# Patient Record
Sex: Male | Born: 1935 | Race: White | Hispanic: No
Health system: Midwestern US, Community
[De-identification: ages and names within clinical notes are randomized; demographics above are authoritative.]

## PROBLEM LIST (undated history)

## (undated) DIAGNOSIS — J810 Acute pulmonary edema: Principal | ICD-10-CM

## (undated) DIAGNOSIS — R1310 Dysphagia, unspecified: Secondary | ICD-10-CM

## (undated) DIAGNOSIS — I10 Essential (primary) hypertension: Secondary | ICD-10-CM

## (undated) DIAGNOSIS — I63512 Cerebral infarction due to unspecified occlusion or stenosis of left middle cerebral artery: Secondary | ICD-10-CM

## (undated) DIAGNOSIS — R339 Retention of urine, unspecified: Secondary | ICD-10-CM

## (undated) DIAGNOSIS — U071 COVID-19: Secondary | ICD-10-CM

## (undated) DIAGNOSIS — R059 Cough, unspecified: Secondary | ICD-10-CM

## (undated) DIAGNOSIS — N401 Enlarged prostate with lower urinary tract symptoms: Secondary | ICD-10-CM

## (undated) DIAGNOSIS — F329 Major depressive disorder, single episode, unspecified: Secondary | ICD-10-CM

## (undated) DIAGNOSIS — N400 Enlarged prostate without lower urinary tract symptoms: Secondary | ICD-10-CM

## (undated) DIAGNOSIS — N183 Chronic kidney disease, stage 3 unspecified: Secondary | ICD-10-CM

## (undated) DIAGNOSIS — I350 Nonrheumatic aortic (valve) stenosis: Secondary | ICD-10-CM

## (undated) DIAGNOSIS — K219 Gastro-esophageal reflux disease without esophagitis: Secondary | ICD-10-CM

## (undated) DIAGNOSIS — I5032 Chronic diastolic (congestive) heart failure: Secondary | ICD-10-CM

## (undated) DIAGNOSIS — F32A Depression, unspecified: Secondary | ICD-10-CM

## (undated) DIAGNOSIS — M1990 Unspecified osteoarthritis, unspecified site: Secondary | ICD-10-CM

## (undated) DIAGNOSIS — M4800 Spinal stenosis, site unspecified: Secondary | ICD-10-CM

## (undated) DIAGNOSIS — K4090 Unilateral inguinal hernia, without obstruction or gangrene, not specified as recurrent: Secondary | ICD-10-CM

## (undated) DIAGNOSIS — H3530 Unspecified macular degeneration: Secondary | ICD-10-CM

## (undated) DIAGNOSIS — K5790 Diverticulosis of intestine, part unspecified, without perforation or abscess without bleeding: Secondary | ICD-10-CM

## (undated) DIAGNOSIS — R0602 Shortness of breath: Secondary | ICD-10-CM

## (undated) DIAGNOSIS — I4892 Unspecified atrial flutter: Secondary | ICD-10-CM

## (undated) DIAGNOSIS — T7840XA Allergy, unspecified, initial encounter: Secondary | ICD-10-CM

## (undated) DIAGNOSIS — Z872 Personal history of diseases of the skin and subcutaneous tissue: Secondary | ICD-10-CM

## (undated) DIAGNOSIS — D649 Anemia, unspecified: Secondary | ICD-10-CM

## (undated) DIAGNOSIS — R918 Other nonspecific abnormal finding of lung field: Secondary | ICD-10-CM

## (undated) DIAGNOSIS — I509 Heart failure, unspecified: Secondary | ICD-10-CM

## (undated) DIAGNOSIS — H9190 Unspecified hearing loss, unspecified ear: Secondary | ICD-10-CM

## (undated) DIAGNOSIS — R911 Solitary pulmonary nodule: Secondary | ICD-10-CM

## (undated) HISTORY — DX: Depression, unspecified: F32.A

## (undated) HISTORY — DX: Unspecified macular degeneration: H35.30

## (undated) HISTORY — PX: CATARACT EXTRACTION, BILATERAL: SHX1313

## (undated) HISTORY — DX: Allergy, unspecified, initial encounter: T78.40XA

## (undated) HISTORY — DX: Chronic diastolic (congestive) heart failure: I50.32

## (undated) HISTORY — DX: Diverticulosis of intestine, part unspecified, without perforation or abscess without bleeding: K57.90

## (undated) HISTORY — DX: Major depressive disorder, single episode, unspecified: F32.9

## (undated) HISTORY — DX: Spinal stenosis, site unspecified: M48.00

## (undated) HISTORY — DX: Personal history of diseases of the skin and subcutaneous tissue: Z87.2

## (undated) HISTORY — DX: Nonrheumatic aortic (valve) stenosis: I35.0

## (undated) HISTORY — DX: Benign prostatic hyperplasia without lower urinary tract symptoms: N40.0

## (undated) HISTORY — DX: Solitary pulmonary nodule: R91.1

## (undated) HISTORY — DX: Essential (primary) hypertension: I10

## (undated) HISTORY — PX: SKIN BIOPSY: SHX1

## (undated) HISTORY — DX: Anemia, unspecified: D64.9

## (undated) HISTORY — DX: Unilateral inguinal hernia, without obstruction or gangrene, not specified as recurrent: K40.90

## (undated) HISTORY — DX: Unspecified hearing loss, unspecified ear: H91.90

## (undated) HISTORY — DX: Other nonspecific abnormal finding of lung field: R91.8

---
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
## 1995-10-29 HISTORY — PX: NECK SURGERY: SHX720

## 1999-10-10 ENCOUNTER — Encounter: Payer: Self-pay | Admitting: Gastroenterology

## 1999-10-10 ENCOUNTER — Ambulatory Visit (HOSPITAL_COMMUNITY): Admission: RE | Admit: 1999-10-10 | Discharge: 1999-10-10 | Payer: Self-pay | Admitting: Gastroenterology

## 2000-10-14 ENCOUNTER — Encounter: Admission: RE | Admit: 2000-10-14 | Discharge: 2000-10-14 | Payer: Self-pay | Admitting: Internal Medicine

## 2000-10-14 ENCOUNTER — Encounter: Payer: Self-pay | Admitting: Internal Medicine

## 2002-06-02 ENCOUNTER — Encounter: Payer: Self-pay | Admitting: Internal Medicine

## 2002-06-02 ENCOUNTER — Encounter: Admission: RE | Admit: 2002-06-02 | Discharge: 2002-06-02 | Payer: Self-pay | Admitting: Internal Medicine

## 2004-10-28 DIAGNOSIS — K5790 Diverticulosis of intestine, part unspecified, without perforation or abscess without bleeding: Secondary | ICD-10-CM

## 2004-10-28 HISTORY — DX: Diverticulosis of intestine, part unspecified, without perforation or abscess without bleeding: K57.90

## 2008-05-16 ENCOUNTER — Encounter: Admission: RE | Admit: 2008-05-16 | Discharge: 2008-06-09 | Payer: Self-pay | Admitting: Internal Medicine

## 2008-08-20 ENCOUNTER — Encounter: Admission: RE | Admit: 2008-08-20 | Discharge: 2008-08-20 | Payer: Self-pay | Admitting: Internal Medicine

## 2008-10-28 DIAGNOSIS — R918 Other nonspecific abnormal finding of lung field: Secondary | ICD-10-CM

## 2008-10-28 HISTORY — DX: Other nonspecific abnormal finding of lung field: R91.8

## 2009-01-19 ENCOUNTER — Inpatient Hospital Stay (HOSPITAL_COMMUNITY): Admission: EM | Admit: 2009-01-19 | Discharge: 2009-01-19 | Payer: Self-pay | Admitting: Emergency Medicine

## 2009-01-24 ENCOUNTER — Ambulatory Visit (HOSPITAL_COMMUNITY): Admission: RE | Admit: 2009-01-24 | Discharge: 2009-01-24 | Payer: Self-pay | Admitting: Internal Medicine

## 2009-06-01 ENCOUNTER — Encounter: Admission: RE | Admit: 2009-06-01 | Discharge: 2009-06-01 | Payer: Self-pay | Admitting: Internal Medicine

## 2009-07-14 ENCOUNTER — Ambulatory Visit (HOSPITAL_COMMUNITY): Admission: RE | Admit: 2009-07-14 | Discharge: 2009-07-14 | Payer: Self-pay | Admitting: Internal Medicine

## 2011-02-07 LAB — CBC
HCT: 38.4 % — ABNORMAL LOW (ref 39.0–52.0)
Hemoglobin: 13.5 g/dL (ref 13.0–17.0)
MCHC: 33.3 g/dL (ref 30.0–36.0)
MCHC: 35.6 g/dL (ref 30.0–36.0)
MCV: 97.4 fL (ref 78.0–100.0)
Platelets: 171 10*3/uL (ref 150–400)
RDW: 13.6 % (ref 11.5–15.5)
RDW: 13.6 % (ref 11.5–15.5)

## 2011-02-07 LAB — COMPREHENSIVE METABOLIC PANEL
ALT: 12 U/L (ref 0–53)
AST: 17 U/L (ref 0–37)
Albumin: 3.9 g/dL (ref 3.5–5.2)
CO2: 27 mEq/L (ref 19–32)
Calcium: 8.6 mg/dL (ref 8.4–10.5)
GFR calc Af Amer: >60 mL/min (ref >60)
GFR calc non Af Amer: >60 mL/min (ref >60)
Sodium: 136 mEq/L (ref 135–145)
Total Protein: 6.8 g/dL (ref 6.0–8.3)

## 2011-02-07 LAB — DIFFERENTIAL
Basophils Absolute: 0 10*3/uL (ref 0.0–0.1)
Basophils Relative: 1 % (ref 0–1)
Monocytes Absolute: 1 10*3/uL (ref 0.1–1.0)
Neutro Abs: 6.1 10*3/uL (ref 1.7–7.7)
Neutrophils Relative %: 66 % (ref 43–77)

## 2011-02-07 LAB — BASIC METABOLIC PANEL
BUN: 11 mg/dL (ref 6–23)
CO2: 27 mEq/L (ref 19–32)
Calcium: 8.6 mg/dL (ref 8.4–10.5)
Creatinine, Ser: 1 mg/dL (ref 0.4–1.5)
Glucose, Bld: 94 mg/dL (ref 70–99)

## 2011-02-07 LAB — LIPID PANEL
Cholesterol: 148 mg/dL (ref 0–200)
HDL: 44 mg/dL (ref >39)
LDL Cholesterol: 96 mg/dL (ref 0–99)
Total CHOL/HDL Ratio: 3.4 RATIO

## 2011-02-07 LAB — POCT CARDIAC MARKERS: CKMB, poc: <1 ng/mL — ABNORMAL LOW (ref 1.0–8.0)

## 2011-02-07 LAB — CARDIAC PANEL(CRET KIN+CKTOT+MB+TROPI): Relative Index: INVALID (ref 0.0–2.5)

## 2011-03-12 NOTE — H&P (Signed)
Joseph Garner, Joseph NO.:  [id number]   MEDICAL RECORD NO.:  [id number]          PATIENT TYPE:  INP   LOCATION:  5506                         FACILITY:  [facility]   PHYSICIAN:  Michiel Cowboy, MDDATE OF BIRTH:  21-Mar-1924   DATE OF ADMISSION:  01/18/2009  DATE OF DISCHARGE:                              HISTORY & PHYSICAL   PRIMARY CARE Chrishawn Kring:  Georgann Housekeeper, M.D.   CHIEF COMPLAINT:  Left shoulder pain and back pain, and somewhat  pleuritic chest pain.   PAST MEDICAL HISTORY:  The past medical history is significant for  elevated blood pressure otherwise unremarkable.   SOCIAL HISTORY:  The patient is a former smoker and does not abuse  drugs.  He drinks only occasionally.  He lives at home with his wife.   REVIEW OF SYSTEMS:  Recently he has not have any history of fevers or  chills.  No cough.  No chest pain per se, just what is described above.  Left shoulder pain as well as right shoulder blade pain.  No abdominal  pain.  No diarrhea.  Otherwise the review of systems are completely  unremarkable.   FAMILY HISTORY:  The family history is noncontributory.   ALLERGIES:  No known drug allergies.   MEDICATIONS:  1. Cardura 8 mg daily.  2. Vesicare 2.5 mg daily.  3. Takes occasional aspirin, but not on a regular basis.   PHYSICAL EXAMINATION:  VITAL SIGNS:  Temperature 98.3, blood pressure  192/83 initially, but now down to 166/72, pulse 72, respirations 27, and  he is satting 100% on room air.  GENERAL APPEARANCE:  The patient appears to be in no acute distress.  HEENT:  Head is atraumatic.  Moist mucous membranes.  LUNGS:  The lungs are clear to auscultation bilaterally.  HEART:  The heart has a regular rate and rhythm, but there is fairly  loud systolic murmur probably significant for aortic stenosis.  ABDOMEN:  The abdomen is soft, nontender and nondistended.  EXTREMITIES:  The lower extremities are without clubbing, cyanosis or  edema.  NEUROLOGIC EXAMINATION:  Strength 5/5 on all four extremities.  The  patient appears to be neurologically intact.  SKIN:  The skin is clean, dry and intact.  MUSCULOSKELETAL:  The musculoskeletal exam notes no tenderness to  palpation over the spine or over the shoulder blades or the shoulder.   LABORATORY DATA:  White blood cell count 9.2 and hemoglobin 13.5.  Sodium 134, potassium 3.8 and creatinine 1.0.  Cardiac enzymes negative.  D-dimer elevated at 0.84.  Chest x-ray showed mild cardiomegaly and  minimal bronchitic changes.  CT of the chest was negative for pulmonary  embolus, but did show a 12-mm right middle lobe nodule of unclear  etiology.  EKG showed first-degree A-V block; no old EKG is available.   ASSESSMENT AND PLAN:  This is an 77 year old gentleman with most likely  musculoskeletal/pleuritic chest pain that is negative for pulmonary  embolus.  Computerized tomographic scan with also newly diagnosed 12-mm  middle nodule.  1. Chest pain.  I think is more likely  musculoskeletal, but given his      risk factors we will cycle cardiac markers, check fasting lipid      panel, hemoglobin A-1-C and place him on telemetry.  Given his loud      murmur we will check a two-dimensional echocardiogram. No evidence      of pulmonary embolus is reassuring.  2. Pulmonary nodule.  We would recommend pulmonary follow up.  He will      likely either need to have a computerized tomographic scan in a few      months or a positron emission tomographic scan.  3. Hypertension.  Continue Cardura.  Follow on telemetry.  4. Prophylaxes.  Protonix plus Lovenox.  5. Code Status.  The patient wishes to be Do Not Resuscitate/Do      Not Intubate as discussed with him and his family.      Michiel Cowboy, MD  Electronically Signed     AVD/MEDQ  D:  01/19/2009  T:  01/19/2009  Job:  [id number]   cc:   Georgann Housekeeper, MD

## 2011-07-30 ENCOUNTER — Ambulatory Visit (INDEPENDENT_AMBULATORY_CARE_PROVIDER_SITE_OTHER): Payer: PRIVATE HEALTH INSURANCE | Admitting: Urology

## 2011-07-30 DIAGNOSIS — R351 Nocturia: Secondary | ICD-10-CM

## 2011-07-30 DIAGNOSIS — M545 Low back pain: Secondary | ICD-10-CM

## 2011-07-30 DIAGNOSIS — R350 Frequency of micturition: Secondary | ICD-10-CM

## 2011-07-30 DIAGNOSIS — N401 Enlarged prostate with lower urinary tract symptoms: Secondary | ICD-10-CM

## 2011-07-30 DIAGNOSIS — R3129 Other microscopic hematuria: Secondary | ICD-10-CM

## 2011-07-31 ENCOUNTER — Other Ambulatory Visit: Payer: Self-pay | Admitting: Urology

## 2011-07-31 ENCOUNTER — Ambulatory Visit (HOSPITAL_COMMUNITY)
Admission: RE | Admit: 2011-07-31 | Discharge: 2011-07-31 | Disposition: A | Discharge status: Home / Self Care | Payer: Medicare Other | Source: Ambulatory Visit | Attending: Urology | Admitting: Urology

## 2011-07-31 DIAGNOSIS — M545 Low back pain, unspecified: Secondary | ICD-10-CM | POA: Insufficient documentation

## 2011-07-31 DIAGNOSIS — R109 Unspecified abdominal pain: Secondary | ICD-10-CM | POA: Insufficient documentation

## 2011-11-26 DIAGNOSIS — J069 Acute upper respiratory infection, unspecified: Secondary | ICD-10-CM | POA: Diagnosis not present

## 2012-02-03 DIAGNOSIS — L259 Unspecified contact dermatitis, unspecified cause: Secondary | ICD-10-CM | POA: Diagnosis not present

## 2012-02-03 DIAGNOSIS — L820 Inflamed seborrheic keratosis: Secondary | ICD-10-CM | POA: Diagnosis not present

## 2012-02-03 DIAGNOSIS — L570 Actinic keratosis: Secondary | ICD-10-CM | POA: Diagnosis not present

## 2012-02-03 DIAGNOSIS — I781 Nevus, non-neoplastic: Secondary | ICD-10-CM | POA: Diagnosis not present

## 2012-06-12 DIAGNOSIS — H6120 Impacted cerumen, unspecified ear: Secondary | ICD-10-CM | POA: Diagnosis not present

## 2012-06-12 DIAGNOSIS — H902 Conductive hearing loss, unspecified: Secondary | ICD-10-CM | POA: Diagnosis not present

## 2012-06-12 DIAGNOSIS — R490 Dysphonia: Secondary | ICD-10-CM | POA: Diagnosis not present

## 2012-07-01 DIAGNOSIS — J069 Acute upper respiratory infection, unspecified: Secondary | ICD-10-CM | POA: Diagnosis not present

## 2012-07-01 DIAGNOSIS — J309 Allergic rhinitis, unspecified: Secondary | ICD-10-CM | POA: Diagnosis not present

## 2012-07-10 DIAGNOSIS — J209 Acute bronchitis, unspecified: Secondary | ICD-10-CM | POA: Diagnosis not present

## 2012-07-24 DIAGNOSIS — E785 Hyperlipidemia, unspecified: Secondary | ICD-10-CM | POA: Diagnosis not present

## 2012-07-24 DIAGNOSIS — K219 Gastro-esophageal reflux disease without esophagitis: Secondary | ICD-10-CM | POA: Diagnosis not present

## 2012-07-24 DIAGNOSIS — Z23 Encounter for immunization: Secondary | ICD-10-CM | POA: Diagnosis not present

## 2012-07-24 DIAGNOSIS — Z0000 Encounter for general adult medical examination without abnormal findings: Secondary | ICD-10-CM | POA: Diagnosis not present

## 2012-07-24 DIAGNOSIS — M1990 Unspecified osteoarthritis, unspecified site: Secondary | ICD-10-CM | POA: Diagnosis not present

## 2012-07-24 DIAGNOSIS — J309 Allergic rhinitis, unspecified: Secondary | ICD-10-CM | POA: Diagnosis not present

## 2012-07-24 DIAGNOSIS — Z1331 Encounter for screening for depression: Secondary | ICD-10-CM | POA: Diagnosis not present

## 2012-07-24 DIAGNOSIS — N400 Enlarged prostate without lower urinary tract symptoms: Secondary | ICD-10-CM | POA: Diagnosis not present

## 2012-07-24 DIAGNOSIS — H8110 Benign paroxysmal vertigo, unspecified ear: Secondary | ICD-10-CM | POA: Diagnosis not present

## 2012-08-27 DIAGNOSIS — L820 Inflamed seborrheic keratosis: Secondary | ICD-10-CM | POA: Diagnosis not present

## 2012-08-27 DIAGNOSIS — L570 Actinic keratosis: Secondary | ICD-10-CM | POA: Diagnosis not present

## 2012-09-08 ENCOUNTER — Ambulatory Visit (INDEPENDENT_AMBULATORY_CARE_PROVIDER_SITE_OTHER): Payer: Medicare Other | Admitting: Urology

## 2012-09-08 DIAGNOSIS — R350 Frequency of micturition: Secondary | ICD-10-CM

## 2012-09-08 DIAGNOSIS — N401 Enlarged prostate with lower urinary tract symptoms: Secondary | ICD-10-CM

## 2012-09-08 DIAGNOSIS — R351 Nocturia: Secondary | ICD-10-CM | POA: Diagnosis not present

## 2012-09-21 DIAGNOSIS — H353190 Nonexudative age-related macular degeneration, unspecified eye, stage unspecified: Secondary | ICD-10-CM | POA: Diagnosis not present

## 2013-01-18 DIAGNOSIS — R109 Unspecified abdominal pain: Secondary | ICD-10-CM | POA: Diagnosis not present

## 2013-01-18 DIAGNOSIS — K4090 Unilateral inguinal hernia, without obstruction or gangrene, not specified as recurrent: Secondary | ICD-10-CM | POA: Diagnosis not present

## 2013-01-22 ENCOUNTER — Encounter (INDEPENDENT_AMBULATORY_CARE_PROVIDER_SITE_OTHER): Payer: Self-pay

## 2013-01-25 ENCOUNTER — Encounter (INDEPENDENT_AMBULATORY_CARE_PROVIDER_SITE_OTHER): Payer: Self-pay | Admitting: General Surgery

## 2013-01-25 ENCOUNTER — Ambulatory Visit (INDEPENDENT_AMBULATORY_CARE_PROVIDER_SITE_OTHER): Payer: Medicare Other | Admitting: General Surgery

## 2013-01-25 ENCOUNTER — Telehealth (INDEPENDENT_AMBULATORY_CARE_PROVIDER_SITE_OTHER): Payer: Self-pay | Admitting: *Deleted

## 2013-01-25 VITALS — BP 98/60 | HR 72 | Temp 98.2°F | Resp 18 | Ht 68.0 in | Wt 166.0 lb

## 2013-01-25 DIAGNOSIS — K4090 Unilateral inguinal hernia, without obstruction or gangrene, not specified as recurrent: Secondary | ICD-10-CM

## 2013-01-25 NOTE — Patient Instructions (Signed)
You have a reducible left inguinal hernia.  We have discussed the different techniques for surgical repair. We have also discussed that this surgery is completely elective at this point.  Since you are having pain, you have stated that you would like to go ahead and have the surgery done sometime this spring.  You will be scheduled for open repair of your left inguinal hernia with mesh.  This should not be stressful to your heart. If you change your mind about the technique, call me back and we can change that to a laparoscopic repair.     Inguinal Hernia, Adult Muscles help keep everything in the body in its proper place. But if a weak spot in the muscles develops, something can poke through. That is called a hernia. When this happens in the lower part of the belly (abdomen), it is called an inguinal hernia. (It takes its name from a part of the body in this region called the inguinal canal.) A weak spot in the wall of muscles lets some fat or part of the small intestine bulge through. An inguinal hernia can develop at any age. Men get them more often than women. CAUSES  In adults, an inguinal hernia develops over time.  It can be triggered by:  Suddenly straining the muscles of the lower abdomen.  Lifting heavy objects.  Straining to have a bowel movement. Difficult bowel movements (constipation) can lead to this.  Constant coughing. This may be caused by smoking or lung disease.  Being overweight.  Being pregnant.  Working at a job that requires long periods of standing or heavy lifting.  Having had an inguinal hernia before. One type can be an emergency situation. It is called a strangulated inguinal hernia. It develops if part of the small intestine slips through the weak spot and cannot get back into the abdomen. The blood supply can be cut off. If that happens, part of the intestine may die. This situation requires emergency surgery. SYMPTOMS  Often, a small inguinal hernia  has no symptoms. It is found when a healthcare provider does a physical exam. Larger hernias usually have symptoms.   In adults, symptoms may include:  A lump in the groin. This is easier to see when the person is standing. It might disappear when lying down.  In men, a lump in the scrotum.  Pain or burning in the groin. This occurs especially when lifting, straining or coughing.  A dull ache or feeling of pressure in the groin.  Signs of a strangulated hernia can include:  A bulge in the groin that becomes very painful and tender to the touch.  A bulge that turns red or purple.  Fever, nausea and vomiting.  Inability to have a bowel movement or to pass gas. DIAGNOSIS  To decide if you have an inguinal hernia, a healthcare provider will probably do a physical examination.  This will include asking questions about any symptoms you have noticed.  The healthcare provider might feel the groin area and ask you to cough. If an inguinal hernia is felt, the healthcare provider may try to slide it back into the abdomen.  Usually no other tests are needed. TREATMENT  Treatments can vary. The size of the hernia makes a difference. Options include:  Watchful waiting. This is often suggested if the hernia is small and you have had no symptoms.  No medical procedure will be done unless symptoms develop.  You will need to watch closely for symptoms. If  any occur, contact your healthcare provider right away.  Surgery. This is used if the hernia is larger or you have symptoms.  Open surgery. This is usually an outpatient procedure (you will not stay overnight in a hospital). An cut (incision) is made through the skin in the groin. The hernia is put back inside the abdomen. The weak area in the muscles is then repaired by herniorrhaphy or hernioplasty. Herniorrhaphy: in this type of surgery, the weak muscles are sewn back together. Hernioplasty: a patch or mesh is used to close the weak area in  the abdominal wall.  Laparoscopy. In this procedure, a surgeon makes small incisions. A thin tube with a tiny video camera (called a laparoscope) is put into the abdomen. The surgeon repairs the hernia with mesh by looking with the video camera and using two long instruments. HOME CARE INSTRUCTIONS   After surgery to repair an inguinal hernia:  You will need to take pain medicine prescribed by your healthcare provider. Follow all directions carefully.  You will need to take care of the wound from the incision.  Your activity will be restricted for awhile. This will probably include no heavy lifting for several weeks. You also should not do anything too active for a few weeks. When you can return to work will depend on the type of job that you have.  During "watchful waiting" periods, you should:  Maintain a healthy weight.  Eat a diet high in fiber (fruits, vegetables and whole grains).  Drink plenty of fluids to avoid constipation. This means drinking enough water and other liquids to keep your urine clear or pale yellow.  Do not lift heavy objects.  Do not stand for long periods of time.  Quit smoking. This should keep you from developing a frequent cough. SEEK MEDICAL CARE IF:   A bulge develops in your groin area.  You feel pain, a burning sensation or pressure in the groin. This might be worse if you are lifting or straining.  You develop a fever of more than 100.5 F (38.1 C). SEEK IMMEDIATE MEDICAL CARE IF:   Pain in the groin increases suddenly.  A bulge in the groin gets bigger suddenly and does not go down.  For men, there is sudden pain in the scrotum. Or, the size of the scrotum increases.  A bulge in the groin area becomes red or purple and is painful to touch.  You have nausea or vomiting that does not go away.  You feel your heart beating much faster than normal.  You cannot have a bowel movement or pass gas.  You develop a fever of more than 102.0 F  (38.9 C). Document Released: 03/02/2009 Document Revised: 01/06/2012 Document Reviewed: 03/02/2009 [facility] Patient Information 2013 [place], [place].

## 2013-01-25 NOTE — Progress Notes (Signed)
Patient ID: Joseph Garner, male   DOB: May 04, 1924, 79 y.o.   MRN: [id number]  Chief Complaint  Patient presents with  . Inguinal Hernia    left    HPI Joseph Garner is a 79 y.o. male.  He is referred to me by Dr. Clinton Sawyer, for evaluation of a symptomatic left inguinal hernia.  This gentleman enjoys good health. Has BPH, mild hypertension, and spinal stenosis. He is quite active and doing well. He was told he had a heart murmur and was evaluated by [facility] cardiology in the past including echocardiogram. Nothing further was required.  Current problem is a two-week history of a painful bulge in his left groin. This hurts when he works out of the ordinary it hurts when he puts his seatbelt on, but it is reducible. No prior history of hernia.  HPI  Past Medical History  Diagnosis Date  . BPH (benign prostatic hypertrophy)   . Diverticulosis 2006  . Depression   . Anemia   . Spinal stenosis   . Lung nodules 2010  . Actinic keratosis, hx of   . Macular degeneration   . Hypertension   . Allergy     allergic rhinitis  . Hearing loss   . Inguinal hernia     Past Surgical History  Procedure Laterality Date  . Tonsillectomy  1933  . Appendectomy  1947  . Neck surgery  1997    compressed nerve  . Skin biopsy  2003-2011    multiple     Family History  Problem Relation Age of Onset  . Prostate cancer Father   . Cancer Mother     passed away age 34    Social History History  Substance Use Topics  . Smoking status: Former Smoker    Quit date: 10/28/1965  . Smokeless tobacco: Not on file  . Alcohol Use: Yes     Comment: occasionally    No Known Allergies  Current Outpatient Prescriptions  Medication Sig Dispense Refill  . aspirin 81 MG tablet Take 81 mg by mouth daily.      Marland Kitchen doxazosin (CARDURA) 8 MG tablet Take 8 mg by mouth at bedtime.      . fluticasone (FLONASE) 50 MCG/ACT nasal spray Place 2 sprays into the nose daily.      Marland Kitchen loratadine (CLARITIN) 10  MG tablet Take 10 mg by mouth daily.      . meloxicam (MOBIC) 15 MG tablet Take 15 mg by mouth as needed for pain.      . Multiple Vitamin (MULTIVITAMINS PO) Take by mouth daily.      . Multiple Vitamins-Minerals (PRESERVISION AREDS PO) Take by mouth.      Marland Kitchen omeprazole (PRILOSEC OTC) 20 MG tablet Take 20 mg by mouth daily.      . solifenacin (VESICARE) 5 MG tablet Take 5 mg by mouth daily.       No current facility-administered medications for this visit.    Review of Systems Review of Systems  Constitutional: Negative for fever, chills and unexpected weight change.  HENT: Negative for hearing loss, congestion, sore throat, trouble swallowing and voice change.   Eyes: Negative for visual disturbance.  Respiratory: Negative for cough and wheezing.   Cardiovascular: Negative for chest pain, palpitations and leg swelling.  Gastrointestinal: Negative for nausea, vomiting, abdominal pain, diarrhea, constipation, blood in stool, abdominal distention, anal bleeding and rectal pain.  Genitourinary: Positive for frequency. Negative for hematuria and difficulty urinating.  Musculoskeletal: Negative for  arthralgias.  Skin: Negative for rash and wound.  Neurological: Negative for seizures, syncope, weakness and headaches.  Hematological: Negative for adenopathy. Does not bruise/bleed easily.  Psychiatric/Behavioral: Negative for confusion.    Blood pressure 98/60, pulse 72, temperature 98.2 F (36.8 C), temperature source Temporal, resp. rate 18, height 5\' 8"  (1.727 m), weight 166 lb (75.297 kg).  Physical Exam Physical Exam  Constitutional: He is oriented to person, place, and time. He appears well-developed and well-nourished. No distress.  HENT:  Head: Normocephalic.  Nose: Nose normal.  Mouth/Throat: No oropharyngeal exudate.  Eyes: Conjunctivae and EOM are normal. Pupils are equal, round, and reactive to light. Right eye exhibits no discharge. Left eye exhibits no discharge. No scleral  icterus.  Neck: Normal range of motion. Neck supple. No JVD present. No tracheal deviation present. No thyromegaly present.  Cardiovascular: Normal rate, regular rhythm, normal heart sounds and intact distal pulses.   No murmur heard. Pulmonary/Chest: Effort normal and breath sounds normal. No stridor. No respiratory distress. He has no wheezes. He has no rales. He exhibits no tenderness.  Abdominal: Soft. Bowel sounds are normal. He exhibits no distension and no mass. There is no tenderness. There is no rebound and no guarding.  RLQ scar from previous appendectomy at age 63. No hernia there.  Umbilicus normal.  Genitourinary:  Small to medium sized left inguinal hernia, reducible. Does not extend beyond the inguinal canal. No hernia on the right. Penis scrotum and testes are normal. No scrotal mass.  Musculoskeletal: Normal range of motion. He exhibits no edema and no tenderness.  Lymphadenopathy:    He has no cervical adenopathy.  Neurological: He is alert and oriented to person, place, and time. He has normal reflexes. Coordination normal.  Skin: Skin is warm and dry. No rash noted. He is not diaphoretic. No erythema. No pallor.  Psychiatric: He has a normal mood and affect. His behavior is normal. Judgment and thought content normal.    Data Reviewed Office notes from Dr. Eula Listen  Assessment    Reducible left inguinal hernia, symptomatic Hypertension BPH Spinal stenosis Remote history of open appendectomy     Plan    I had a long discussion with the patient about his left inguinal hernia. I told him that surgical repair was elective at this point, and mostly driven by his desires and the amount of pain he was having and whether it was interfering with his activities. He stated he was inclined to have a hernia repair this spring.  I discussed open and laparoscopic approaches. There did not seem to be much advantage or disadvantage either way. At this point in time we have elected  to schedule for open repair of left inguinal hernia with mesh.  I have discussed the indications, details, techniques, and numerous risks of open inguinal hernia repair with him. He understands these issues. All his questions were answered. He agrees with this plan. We will schedule surgery at his convenience.        Angelia Mould. Derrell Lolling, M.D., [facility] General and Minimally invasive Surgery Breast and Colorectal Surgery Office:   [phone] Pager:   [phone]  01/25/2013, 3:39 PM

## 2013-01-25 NOTE — Telephone Encounter (Signed)
Left message for patient to call me back.  Wanted to see if patient could come in at 300p this afternoon instead of 5p.

## 2013-01-25 NOTE — Telephone Encounter (Signed)
Patient states he is able to come earlier and will arrive at 245p for a 3p appointment today.

## 2013-01-26 ENCOUNTER — Telehealth (INDEPENDENT_AMBULATORY_CARE_PROVIDER_SITE_OTHER): Payer: Self-pay

## 2013-01-26 NOTE — Telephone Encounter (Signed)
The patient called back and states he would prefer laparoscopic surgery if Dr Derrell Lolling felt it was appropriate for him.  He wants to know which one is better.   I told him we will discuss it this afternoon and call him back.

## 2013-01-26 NOTE — Telephone Encounter (Signed)
I called and left the patient's wife know the message that we will be happy to schedule laparoscopic surgery.

## 2013-01-26 NOTE — Telephone Encounter (Signed)
Message copied by Ivory Broad on Tue Jan 26, 2013 10:11 AM ------      Message from: Marchia Bond      Created: Tue Jan 26, 2013  9:55 AM      Regarding: question      Contact: [phone]       Please call pt has some question about the type of surgery he is having ------

## 2013-01-26 NOTE — Telephone Encounter (Signed)
I called the patient and he said he was unclear about lap vs open.  He would like to review the information he was given before he decides.  I read to him what Dr Derrell Lolling had dictated in his note.  He also wondered if Dr Derrell Lolling thought one was better than the other.  He will call back after he has read the info.

## 2013-01-26 NOTE — Telephone Encounter (Signed)
Tail patient that laparoscopic hernia repair is very appropriate for him, and we will be happy to schedule this for him.

## 2013-01-27 ENCOUNTER — Other Ambulatory Visit (INDEPENDENT_AMBULATORY_CARE_PROVIDER_SITE_OTHER): Payer: Self-pay | Admitting: General Surgery

## 2013-02-01 ENCOUNTER — Telehealth (INDEPENDENT_AMBULATORY_CARE_PROVIDER_SITE_OTHER): Payer: Self-pay | Admitting: General Surgery

## 2013-02-01 NOTE — Telephone Encounter (Signed)
Patient called and wanted to discuss his left inguinal hernia. He said he was having somewhat more pain, but  when he laid down the hernia was reducible and the pain resolved. He denied abdominal pain nausea or vomiting. I offered to move up the surgery if the schedule would allow and he said he would like to do that. We will asked to schedule her to find the first available time.   Angelia Mould. Derrell Lolling, M.D., [facility] General and Minimally invasive Surgery Breast and Colorectal Surgery Office:   [phone] Pager:   [phone]

## 2013-02-01 NOTE — Telephone Encounter (Signed)
Left message on machine to call when convenient to further discuss hernia surgery.  Joseph Garner. Derrell Lolling, M.D., [facility] General and Minimally invasive Surgery Breast and Colorectal Surgery Office:   [phone] Pager:   [phone]

## 2013-02-02 ENCOUNTER — Encounter (HOSPITAL_COMMUNITY): Payer: Self-pay | Admitting: Pharmacy Technician

## 2013-02-02 NOTE — Patient Instructions (Addendum)
[address]  02/02/2013   Your procedure is scheduled on:   02-11-2013  Report to [facility] at      11:30   AM .  Call this number if you have problems the morning of surgery: [phone]  Or Presurgical Testing [phone](Mialani Reicks)     Do not eat food:After Midnight.  May have clear liquids:up to 6 Hours before arrival. Nothing after : 0800 AM  Clear liquids include soda, tea, black coffee, apple or grape juice, broth.  Take these medicines the morning of surgery with A SIP OF WATER: Omeprazole. Use Flonase nasal spray. Claritin(if needed).   Do not wear jewelry, make-up or nail polish.  Do not wear lotions, powders, or perfumes. You may wear deodorant.  Do not shave 12 hours prior to first CHG shower(legs and under arms).(face and neck okay.)  Do not bring valuables to the hospital.  Contacts, dentures or bridgework,body piercing,  may not be worn into surgery.  Leave suitcase in the car. After surgery it may be brought to your room.  For patients admitted to the hospital, checkout time is 11:00 AM the day of discharge.   Patients discharged the day of surgery will not be allowed to drive home. Must have responsible person with you x 24 hours once discharged.  Name and phone number of your driver: Lucilla Lame [phone] h/ [phone] cell  Special Instructions: CHG(Chlorhedine 4%-"Hibiclens","Betasept","Aplicare") Shower Use Special Wash: see special instructions.(avoid face and genitals)   Please read over the following fact sheets that you were given: MRSA Information.   Failure to follow these instructions may result in Cancellation of your surgery.   Patient signature_______________________________________________________

## 2013-02-03 ENCOUNTER — Encounter (HOSPITAL_COMMUNITY): Payer: Self-pay

## 2013-02-03 ENCOUNTER — Encounter (HOSPITAL_COMMUNITY)
Admission: RE | Admit: 2013-02-03 | Discharge: 2013-02-03 | Disposition: A | Discharge status: Home / Self Care | Payer: Medicare Other | Source: Ambulatory Visit | Attending: General Surgery | Admitting: General Surgery

## 2013-02-03 ENCOUNTER — Ambulatory Visit (HOSPITAL_COMMUNITY)
Admission: RE | Admit: 2013-02-03 | Discharge: 2013-02-03 | Disposition: A | Discharge status: Home / Self Care | Payer: Medicare Other | Source: Ambulatory Visit | Attending: General Surgery | Admitting: General Surgery

## 2013-02-03 DIAGNOSIS — K4090 Unilateral inguinal hernia, without obstruction or gangrene, not specified as recurrent: Secondary | ICD-10-CM | POA: Insufficient documentation

## 2013-02-03 DIAGNOSIS — R911 Solitary pulmonary nodule: Secondary | ICD-10-CM | POA: Diagnosis not present

## 2013-02-03 DIAGNOSIS — Z01812 Encounter for preprocedural laboratory examination: Secondary | ICD-10-CM | POA: Insufficient documentation

## 2013-02-03 DIAGNOSIS — Z79899 Other long term (current) drug therapy: Secondary | ICD-10-CM | POA: Diagnosis not present

## 2013-02-03 DIAGNOSIS — Z01810 Encounter for preprocedural cardiovascular examination: Secondary | ICD-10-CM | POA: Diagnosis not present

## 2013-02-03 DIAGNOSIS — I10 Essential (primary) hypertension: Secondary | ICD-10-CM | POA: Diagnosis not present

## 2013-02-03 DIAGNOSIS — I77810 Thoracic aortic ectasia: Secondary | ICD-10-CM | POA: Diagnosis not present

## 2013-02-03 DIAGNOSIS — Z01818 Encounter for other preprocedural examination: Secondary | ICD-10-CM | POA: Insufficient documentation

## 2013-02-03 DIAGNOSIS — J40 Bronchitis, not specified as acute or chronic: Secondary | ICD-10-CM | POA: Diagnosis not present

## 2013-02-03 DIAGNOSIS — K219 Gastro-esophageal reflux disease without esophagitis: Secondary | ICD-10-CM | POA: Diagnosis not present

## 2013-02-03 HISTORY — DX: Gastro-esophageal reflux disease without esophagitis: K21.9

## 2013-02-03 HISTORY — DX: Unspecified osteoarthritis, unspecified site: M19.90

## 2013-02-03 LAB — BASIC METABOLIC PANEL
BUN: 12 mg/dL (ref 6–23)
Creatinine, Ser: 1.05 mg/dL (ref 0.50–1.35)
GFR calc non Af Amer: 62 mL/min — ABNORMAL LOW (ref >90)
Glucose, Bld: 86 mg/dL (ref 70–99)
Potassium: 4.2 mEq/L (ref 3.5–5.1)

## 2013-02-03 LAB — SURGICAL PCR SCREEN: MRSA, PCR: POSITIVE — AB

## 2013-02-03 LAB — CBC
HCT: 36.5 % — ABNORMAL LOW (ref 39.0–52.0)
Hemoglobin: 12.7 g/dL — ABNORMAL LOW (ref 13.0–17.0)
MCH: 33 pg (ref 26.0–34.0)
MCHC: 34.8 g/dL (ref 30.0–36.0)

## 2013-02-03 NOTE — Progress Notes (Signed)
02-03-13 1545 Pt. 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

## 2013-02-03 NOTE — Pre-Procedure Instructions (Signed)
02-03-13 EKG/ CXR done today.W. Kennon Portela

## 2013-02-04 NOTE — H&P (Signed)
Joseph Garner   MRN:  [id number]   Description: 79 year old male  Provider: Ernestene Mention, MD  Department: [facility]       Diagnoses    Left inguinal hernia    -  Primary    550.90        Current Vitals - Last Recorded    BP Pulse Temp(Src) Resp Ht Wt    98/60 72 98.2 F (36.8 C) (Temporal) 18 5\' 8"  (1.727 m) 166 lb (75.297 kg)    BMI - 25.25 kg/m2                 History and Physical    Ernestene Mention, MD   Status: Signed                         HPI Joseph Garner is a 79 y.o. male.  He is referred to me by Dr. Clinton Sawyer, for evaluation of a symptomatic left inguinal hernia.   This gentleman enjoys good health. Has BPH, mild hypertension, and spinal stenosis. He is quite active and doing well. He was told he had a heart murmur and was evaluated by [facility] cardiology in the past including echocardiogram. Nothing further was required.   Current problem is a two-week history of a painful bulge in his left groin. This hurts when he works out of the ordinary it hurts when he puts his seatbelt on, but it is reducible. No prior history of hernia. He states he is having a fair amount of pain, and would like to move ahead with an operation to repair the hernia.       Past Medical History   Diagnosis  Date   .  BPH (benign prostatic hypertrophy)     .  Diverticulosis  2006   .  Depression     .  Anemia     .  Spinal stenosis     .  Lung nodules  2010   .  Actinic keratosis, hx of     .  Macular degeneration     .  Hypertension     .  Allergy         allergic rhinitis   .  Hearing loss     .  Inguinal hernia           Past Surgical History   Procedure  Laterality  Date   .  Tonsillectomy    1933   .  Appendectomy    1947   .  Neck surgery    1997       compressed nerve   .  Skin biopsy    2003-2011       multiple          Family History   Problem  Relation  Age of Onset   .  Prostate cancer  Father     .  Cancer   Mother         passed away age 15        Social History History   Substance Use Topics   .  Smoking status:  Former Smoker       Quit date:  10/28/1965   .  Smokeless tobacco:  Not on file   .  Alcohol Use:  Yes         Comment: occasionally        No Known Allergies  Current Outpatient Prescriptions   Medication  Sig  Dispense  Refill   .  aspirin 81 MG tablet  Take 81 mg by mouth daily.         Marland Kitchen  doxazosin (CARDURA) 8 MG tablet  Take 8 mg by mouth at bedtime.         .  fluticasone (FLONASE) 50 MCG/ACT nasal spray  Place 2 sprays into the nose daily.         Marland Kitchen  loratadine (CLARITIN) 10 MG tablet  Take 10 mg by mouth daily.         .  meloxicam (MOBIC) 15 MG tablet  Take 15 mg by mouth as needed for pain.         .  Multiple Vitamin (MULTIVITAMINS PO)  Take by mouth daily.         .  Multiple Vitamins-Minerals (PRESERVISION AREDS PO)  Take by mouth.         Marland Kitchen  omeprazole (PRILOSEC OTC) 20 MG tablet  Take 20 mg by mouth daily.         .  solifenacin (VESICARE) 5 MG tablet  Take 5 mg by mouth daily.             No current facility-administered medications for this visit.        Review of Systems  Constitutional: Negative for fever, chills and unexpected weight change.  HENT: Negative for hearing loss, congestion, sore throat, trouble swallowing and voice change.   Eyes: Negative for visual disturbance.  Respiratory: Negative for cough and wheezing.   Cardiovascular: Negative for chest pain, palpitations and leg swelling.  Gastrointestinal: Negative for nausea, vomiting, abdominal pain, diarrhea, constipation, blood in stool, abdominal distention, anal bleeding and rectal pain.  Genitourinary: Positive for frequency. Negative for hematuria and difficulty urinating.  Musculoskeletal: Negative for arthralgias.  Skin: Negative for rash and wound.  Neurological: Negative for seizures, syncope, weakness and headaches.  Hematological: Negative for adenopathy. Does not  bruise/bleed easily.  Psychiatric/Behavioral: Negative for confusion.      Blood pressure 98/60, pulse 72, temperature 98.2 F (36.8 C), temperature source Temporal, resp. rate 18, height 5\' 8"  (1.727 m), weight 166 lb (75.297 kg).   Physical Exam  Constitutional: He is oriented to person, place, and time. He appears well-developed and well-nourished. No distress.  HENT:   Head: Normocephalic.   Nose: Nose normal.   Mouth/Throat: No oropharyngeal exudate.  Eyes: Conjunctivae and EOM are normal. Pupils are equal, round, and reactive to light. Right eye exhibits no discharge. Left eye exhibits no discharge. No scleral icterus.  Neck: Normal range of motion. Neck supple. No JVD present. No tracheal deviation present. No thyromegaly present.  Cardiovascular: Normal rate, regular rhythm, normal heart sounds and intact distal pulses.    No murmur heard. Pulmonary/Chest: Effort normal and breath sounds normal. No stridor. No respiratory distress. He has no wheezes. He has no rales. He exhibits no tenderness.  Abdominal: Soft. Bowel sounds are normal. He exhibits no distension and no mass. There is no tenderness. There is no rebound and no guarding.  RLQ scar from previous appendectomy at age 14. No hernia there.  Umbilicus normal.  Genitourinary:  Small to medium sized left inguinal hernia, reducible. Does not extend beyond the inguinal canal. No hernia on the right. Penis scrotum and testes are normal. No scrotal mass.  Musculoskeletal: Normal range of motion. He exhibits no edema and no tenderness.  Lymphadenopathy:    He has no  cervical adenopathy.  Neurological: He is alert and oriented to person, place, and time. He has normal reflexes. Coordination normal.  Skin: Skin is warm and dry. No rash noted. He is not diaphoretic. No erythema. No pallor.  Psychiatric: He has a normal mood and affect. His behavior is normal. Judgment and thought content normal.      Data Reviewed Office  notes from Dr. Eula Listen   Assessment    Reducible left inguinal hernia, symptomatic Hypertension BPH Spinal stenosis Remote history of open appendectomy      Plan    I had a long discussion with the patient about his left inguinal hernia. I told him that surgical repair was elective at this point, and mostly driven by his desires and the amount of pain he was having and whether it was interfering with his activities. He stated he was inclined to have a hernia repair this spring.   I discussed open and laparoscopic approaches. There did not seem to be much advantage or disadvantage either way. At this point in time we have elected to schedule for laparoscopic repair of left inguinal hernia with mesh, possible open.   I have discussed the indications, details, techniques, and numerous risks of  inguinal hernia repair with him. He understands these issues. All his questions were answered. He agrees with this plan. We will schedule surgery at his convenience.           Angelia Mould. Derrell Lolling, M.D., [facility] General and Minimally invasive Surgery Breast and Colorectal Surgery Office:   [phone] Pager:   [phone]

## 2013-02-11 ENCOUNTER — Ambulatory Visit (HOSPITAL_COMMUNITY)
Admission: RE | Admit: 2013-02-11 | Discharge: 2013-02-11 | Disposition: A | Discharge status: Home / Self Care | Payer: Medicare Other | Source: Ambulatory Visit | Attending: General Surgery | Admitting: General Surgery

## 2013-02-11 ENCOUNTER — Encounter (HOSPITAL_COMMUNITY): Payer: Self-pay | Admitting: Anesthesiology

## 2013-02-11 ENCOUNTER — Ambulatory Visit (HOSPITAL_COMMUNITY): Payer: Medicare Other | Admitting: Anesthesiology

## 2013-02-11 ENCOUNTER — Encounter (HOSPITAL_COMMUNITY)
Admission: RE | Disposition: A | Discharge status: Home / Self Care | Payer: Self-pay | Source: Ambulatory Visit | Attending: General Surgery

## 2013-02-11 ENCOUNTER — Encounter (HOSPITAL_COMMUNITY): Payer: Self-pay | Admitting: Certified Registered Nurse Anesthetist

## 2013-02-11 DIAGNOSIS — F329 Major depressive disorder, single episode, unspecified: Secondary | ICD-10-CM | POA: Insufficient documentation

## 2013-02-11 DIAGNOSIS — K5730 Diverticulosis of large intestine without perforation or abscess without bleeding: Secondary | ICD-10-CM | POA: Diagnosis not present

## 2013-02-11 DIAGNOSIS — K4090 Unilateral inguinal hernia, without obstruction or gangrene, not specified as recurrent: Secondary | ICD-10-CM | POA: Insufficient documentation

## 2013-02-11 DIAGNOSIS — N400 Enlarged prostate without lower urinary tract symptoms: Secondary | ICD-10-CM | POA: Diagnosis not present

## 2013-02-11 DIAGNOSIS — K219 Gastro-esophageal reflux disease without esophagitis: Secondary | ICD-10-CM | POA: Insufficient documentation

## 2013-02-11 DIAGNOSIS — R011 Cardiac murmur, unspecified: Secondary | ICD-10-CM | POA: Diagnosis not present

## 2013-02-11 DIAGNOSIS — I10 Essential (primary) hypertension: Secondary | ICD-10-CM | POA: Diagnosis not present

## 2013-02-11 DIAGNOSIS — M4800 Spinal stenosis, site unspecified: Secondary | ICD-10-CM | POA: Insufficient documentation

## 2013-02-11 DIAGNOSIS — F3289 Other specified depressive episodes: Secondary | ICD-10-CM | POA: Insufficient documentation

## 2013-02-11 DIAGNOSIS — D176 Benign lipomatous neoplasm of spermatic cord: Secondary | ICD-10-CM | POA: Insufficient documentation

## 2013-02-11 DIAGNOSIS — N401 Enlarged prostate with lower urinary tract symptoms: Secondary | ICD-10-CM | POA: Diagnosis not present

## 2013-02-11 HISTORY — PX: INGUINAL HERNIA REPAIR: SHX194

## 2013-02-11 HISTORY — PX: INSERTION OF MESH: SHX5868

## 2013-02-11 SURGERY — REPAIR, HERNIA, INGUINAL, LAPAROSCOPIC
Anesthesia: General | Site: Abdomen | Laterality: Left | Wound class: Clean

## 2013-02-11 MED ORDER — BUPIVACAINE-EPINEPHRINE 0.5% -1:200000 IJ SOLN
INTRAMUSCULAR | Status: DC | PRN
  Administered 2013-02-11: 30 mL

## 2013-02-11 MED ORDER — ROCURONIUM BROMIDE 100 MG/10ML IV SOLN
INTRAVENOUS | Status: DC | PRN
  Administered 2013-02-11: 35 mg via INTRAVENOUS
  Administered 2013-02-11: 10 mg via INTRAVENOUS

## 2013-02-11 MED ORDER — ONDANSETRON HCL 4 MG/2ML IJ SOLN
INTRAMUSCULAR | Status: DC | PRN
  Administered 2013-02-11: 4 mg via INTRAVENOUS

## 2013-02-11 MED ORDER — LACTATED RINGERS IV SOLN
INTRAVENOUS | Status: DC | PRN
  Administered 2013-02-11: 1000 mL via INTRAVENOUS

## 2013-02-11 MED ORDER — ACETAMINOPHEN 10 MG/ML IV SOLN
INTRAVENOUS | Status: AC
  Filled 2013-02-11: qty 100

## 2013-02-11 MED ORDER — NEOSTIGMINE METHYLSULFATE 1 MG/ML IJ SOLN
INTRAMUSCULAR | Status: DC | PRN
  Administered 2013-02-11: 5 mg via INTRAVENOUS

## 2013-02-11 MED ORDER — BUPIVACAINE-EPINEPHRINE (PF) 0.5% -1:200000 IJ SOLN
INTRAMUSCULAR | Status: AC
  Filled 2013-02-11: qty 10

## 2013-02-11 MED ORDER — PROMETHAZINE HCL 25 MG/ML IJ SOLN: 6.2500 mg | INTRAMUSCULAR | Status: DC | PRN

## 2013-02-11 MED ORDER — SODIUM CHLORIDE 0.9 % IJ SOLN: 3.0000 mL | Freq: Two times a day (BID) | INTRAMUSCULAR | Status: DC

## 2013-02-11 MED ORDER — LIDOCAINE HCL (CARDIAC) 20 MG/ML IV SOLN
INTRAVENOUS | Status: DC | PRN
  Administered 2013-02-11: 100 mg via INTRAVENOUS

## 2013-02-11 MED ORDER — SODIUM CHLORIDE 0.9 % IV SOLN: 250.0000 mL | INTRAVENOUS | Status: DC | PRN

## 2013-02-11 MED ORDER — VANCOMYCIN HCL IN DEXTROSE 1-5 GM/200ML-% IV SOLN
1000.0000 mg | Freq: Once | INTRAVENOUS | Status: AC
  Administered 2013-02-11: 1000 mg via INTRAVENOUS

## 2013-02-11 MED ORDER — PROPOFOL 10 MG/ML IV BOLUS
INTRAVENOUS | Status: DC | PRN
  Administered 2013-02-11: 100 mg via INTRAVENOUS

## 2013-02-11 MED ORDER — FENTANYL CITRATE 0.05 MG/ML IJ SOLN
INTRAMUSCULAR | Status: DC | PRN
  Administered 2013-02-11 (×2): 25 ug via INTRAVENOUS
  Administered 2013-02-11 (×2): 50 ug via INTRAVENOUS

## 2013-02-11 MED ORDER — SUCCINYLCHOLINE CHLORIDE 20 MG/ML IJ SOLN
INTRAMUSCULAR | Status: DC | PRN
  Administered 2013-02-11: 100 mg via INTRAVENOUS

## 2013-02-11 MED ORDER — GLYCOPYRROLATE 0.2 MG/ML IJ SOLN
INTRAMUSCULAR | Status: DC | PRN
  Administered 2013-02-11: 0.2 mg via INTRAVENOUS
  Administered 2013-02-11: 0.6 mg via INTRAVENOUS

## 2013-02-11 MED ORDER — VANCOMYCIN HCL IN DEXTROSE 1-5 GM/200ML-% IV SOLN
INTRAVENOUS | Status: AC
  Filled 2013-02-11: qty 200

## 2013-02-11 MED ORDER — ACETAMINOPHEN 10 MG/ML IV SOLN
INTRAVENOUS | Status: DC | PRN
  Administered 2013-02-11: 1000 mg via INTRAVENOUS

## 2013-02-11 MED ORDER — MORPHINE SULFATE 10 MG/ML IJ SOLN: 1.0000 mg | INTRAMUSCULAR | Status: DC | PRN

## 2013-02-11 MED ORDER — EPHEDRINE SULFATE 50 MG/ML IJ SOLN
INTRAMUSCULAR | Status: DC | PRN
  Administered 2013-02-11 (×3): 5 mg via INTRAVENOUS

## 2013-02-11 MED ORDER — ACETAMINOPHEN 325 MG PO TABS: 650.0000 mg | ORAL_TABLET | ORAL | Status: DC | PRN

## 2013-02-11 MED ORDER — OXYCODONE HCL 5 MG PO TABS: 5.0000 mg | ORAL_TABLET | ORAL | Status: DC | PRN

## 2013-02-11 MED ORDER — LACTATED RINGERS IV SOLN
INTRAVENOUS | Status: DC
  Administered 2013-02-11: 13:00:00 via INTRAVENOUS

## 2013-02-11 MED ORDER — HYDROCODONE-ACETAMINOPHEN 5-325 MG PO TABS
1.0000 | ORAL_TABLET | ORAL | Status: DC | PRN
Start: 2013-02-11 — End: 2013-03-09

## 2013-02-11 MED ORDER — CEFAZOLIN SODIUM-DEXTROSE 2-3 GM-% IV SOLR
INTRAVENOUS | Status: AC
  Filled 2013-02-11: qty 50

## 2013-02-11 MED ORDER — LACTATED RINGERS IV SOLN: INTRAVENOUS | Status: DC

## 2013-02-11 MED ORDER — SODIUM CHLORIDE 0.9 % IJ SOLN: 3.0000 mL | INTRAMUSCULAR | Status: DC | PRN

## 2013-02-11 MED ORDER — ONDANSETRON HCL 4 MG/2ML IJ SOLN: 4.0000 mg | Freq: Four times a day (QID) | INTRAMUSCULAR | Status: DC | PRN

## 2013-02-11 MED ORDER — SODIUM CHLORIDE 0.9 % IV SOLN: INTRAVENOUS | Status: DC

## 2013-02-11 MED ORDER — CEFAZOLIN SODIUM-DEXTROSE 2-3 GM-% IV SOLR: 2.0000 g | INTRAVENOUS | Status: DC

## 2013-02-11 MED ORDER — CHLORHEXIDINE GLUCONATE 4 % EX LIQD: 1.0000 "application " | Freq: Once | CUTANEOUS | Status: DC

## 2013-02-11 MED ORDER — DEXAMETHASONE SODIUM PHOSPHATE 10 MG/ML IJ SOLN
INTRAMUSCULAR | Status: DC | PRN
  Administered 2013-02-11: 10 mg via INTRAVENOUS

## 2013-02-11 MED ORDER — ACETAMINOPHEN 650 MG RE SUPP
650.0000 mg | RECTAL | Status: DC | PRN
  Filled 2013-02-11: qty 1

## 2013-02-11 SURGICAL SUPPLY — 34 items
APPLIER CLIP LOGIC TI 5 (MISCELLANEOUS) ×2 IMPLANT
BENZOIN TINCTURE PRP APPL 2/3 (GAUZE/BANDAGES/DRESSINGS) IMPLANT
CABLE HIGH FREQUENCY MONO STRZ (ELECTRODE) IMPLANT
CLOTH BEACON ORANGE TIMEOUT ST (SAFETY) ×2 IMPLANT
DECANTER SPIKE VIAL GLASS SM (MISCELLANEOUS) IMPLANT
DERMABOND ADVANCED (GAUZE/BANDAGES/DRESSINGS) ×1
DERMABOND ADVANCED .7 DNX12 (GAUZE/BANDAGES/DRESSINGS) ×1 IMPLANT
DISSECT BALLN SPACEMKR + OVL (BALLOONS) ×2
DISSECTOR BALLN SPACEMKR + OVL (BALLOONS) ×1 IMPLANT
DISSECTOR BLUNT TIP ENDO 5MM (MISCELLANEOUS) IMPLANT
DRAPE LAPAROSCOPIC ABDOMINAL (DRAPES) ×2 IMPLANT
ELECT REM PT RETURN 9FT ADLT (ELECTROSURGICAL) ×2
ELECTRODE REM PT RTRN 9FT ADLT (ELECTROSURGICAL) ×1 IMPLANT
GLOVE EUDERMIC 7 POWDERFREE (GLOVE) ×2 IMPLANT
GOWN STRL NON-REIN LRG LVL3 (GOWN DISPOSABLE) ×2 IMPLANT
GOWN STRL REIN XL XLG (GOWN DISPOSABLE) ×6 IMPLANT
KIT BASIN OR (CUSTOM PROCEDURE TRAY) ×2 IMPLANT
MESH ULTRAPRO 3X6 7.6X15CM (Mesh General) ×2 IMPLANT
NEEDLE INSUFFLATION 14GA 120MM (NEEDLE) IMPLANT
SCISSORS LAP 5X35 DISP (ENDOMECHANICALS) IMPLANT
SET IRRIG TUBING LAPAROSCOPIC (IRRIGATION / IRRIGATOR) ×2 IMPLANT
SOLUTION ANTI FOG 6CC (MISCELLANEOUS) ×2 IMPLANT
STOPCOCK K 69 2C6206 (IV SETS) ×2 IMPLANT
STRIP CLOSURE SKIN 1/2X4 (GAUZE/BANDAGES/DRESSINGS) IMPLANT
SUT MNCRL AB 4-0 PS2 18 (SUTURE) ×2 IMPLANT
SUT VIC AB 3-0 SH 27 (SUTURE)
SUT VIC AB 3-0 SH 27XBRD (SUTURE) IMPLANT
TACKER 5MM HERNIA 3.5CML NAB (ENDOMECHANICALS) ×2 IMPLANT
TOWEL OR 17X26 10 PK STRL BLUE (TOWEL DISPOSABLE) ×2 IMPLANT
TRAY FOLEY CATH 14FRSI W/METER (CATHETERS) ×2 IMPLANT
TRAY LAP CHOLE (CUSTOM PROCEDURE TRAY) ×2 IMPLANT
TROCAR BLADELESS OPT 5 75 (ENDOMECHANICALS) IMPLANT
TROCAR CANNULA W/PORT DUAL 5MM (MISCELLANEOUS) ×2 IMPLANT
TUBING INSUFFLATION 10FT LAP (TUBING) ×2 IMPLANT

## 2013-02-11 NOTE — Anesthesia Preprocedure Evaluation (Addendum)
Anesthesia Evaluation  Patient identified by MRN, date of birth, ID band Patient awake    Reviewed: Allergy & Precautions, H&P , NPO status , Patient's Chart, lab work & pertinent test results  Airway       Dental   Pulmonary neg pulmonary ROS,          Cardiovascular Exercise Tolerance: Good hypertension, Pt. on medications + Systolic murmurs ECG and CXR reviewed.  3/6 systolic murmur.  H/O heart murmur. Echocardiogram years ago and he was told there was nothing serious.   Neuro/Psych Depression HOH; Spinal stenosis negative neurological ROS  negative psych ROS   GI/Hepatic negative GI ROS, Neg liver ROS, GERD-  Medicated,  Endo/Other  negative endocrine ROS  Renal/GU negative Renal ROS   BPH    Musculoskeletal negative musculoskeletal ROS (+)   Abdominal   Peds  Hematology negative hematology ROS (+)   Anesthesia Other Findings   Reproductive/Obstetrics                        Anesthesia Physical Anesthesia Plan  ASA: II  Anesthesia Plan: General   Post-op Pain Management:    Induction: Intravenous  Airway Management Planned: Oral ETT  Additional Equipment:   Intra-op Plan:   Post-operative Plan: Extubation in OR  Informed Consent: I have reviewed the patients History and Physical, chart, labs and discussed the procedure including the risks, benefits and alternatives for the proposed anesthesia with the patient or authorized representative who has indicated his/her understanding and acceptance.   Dental advisory given  Plan Discussed with: CRNA  Anesthesia Plan Comments:         Anesthesia Quick Evaluation

## 2013-02-11 NOTE — Progress Notes (Signed)
Spoke w Murdock and she is aware that Dr Derrell Lolling wants vancomycin to be completed prior to  Surgical start

## 2013-02-11 NOTE — Transfer of Care (Signed)
Immediate Anesthesia Transfer of Care Note  Patient: Joseph Garner  Procedure(s) Performed: Procedure(s) (LRB): LEFT LAPAROSCOPIC REPAIR INGUINAL HERNIA WITH MESH (Left) INSERTION OF MESH (Left)  Patient Location: PACU  Anesthesia Type: General  Level of Consciousness: sedated, patient cooperative and responds to stimulaton  Airway & Oxygen Therapy: Patient Spontanous Breathing and Patient connected to face mask oxgen  Post-op Assessment: Report given to PACU RN and Post -op Vital signs reviewed and stable  Post vital signs: Reviewed and stable  Complications: No apparent anesthesia complications

## 2013-02-11 NOTE — Preoperative (Signed)
Beta Blockers   Reason not to administer Beta Blockers:Not Applicable 

## 2013-02-11 NOTE — Anesthesia Postprocedure Evaluation (Signed)
  Anesthesia Post-op Note  Patient: Joseph Garner  Procedure(s) Performed: Procedure(s) (LRB): LEFT LAPAROSCOPIC REPAIR INGUINAL HERNIA WITH MESH (Left) INSERTION OF MESH (Left)  Patient Location: PACU  Anesthesia Type: General  Level of Consciousness: awake and alert   Airway and Oxygen Therapy: Patient Spontanous Breathing  Post-op Pain: mild  Post-op Assessment: Post-op Vital signs reviewed, Patient's Cardiovascular Status Stable, Respiratory Function Stable, Patent Airway and No signs of Nausea or vomiting  Last Vitals:  Filed Vitals:   02/11/13 1615  BP:   Pulse:   Temp: 36.7 C  Resp:     Post-op Vital Signs: stable   Complications: No apparent anesthesia complications

## 2013-02-11 NOTE — Op Note (Signed)
Patient Name:           Joseph Garner   Date of Surgery:        02/11/2013  Pre op Diagnosis:      Left inguinal hernia  Post op Diagnosis:    Same  Procedure:                 Laparoscopic, preperitoneal repair of left inguinal hernia with ultra Pro mesh  Surgeon:                     Angelia Mould. Derrell Lolling, M.D., FACS  Assistant:                      None  Operative Indications:   Joseph Garner is a 79 y.o. male. He is referred to me by Dr. Clinton Sawyer, for evaluation of a symptomatic left inguinal hernia. This gentleman enjoys good health. Has BPH, mild hypertension, and spinal stenosis. He is quite active and doing well. He was told he had a heart murmur and was evaluated by [facility] cardiology in the past including echocardiogram. Nothing further was required.  Current problem is a four week history of a painful bulge in his left groin. This hurts when he works out of the ordinary it hurts when he puts his seatbelt on, but it is reducible. No prior history of hernia. He states he is having a fair amount of pain, and would like to move ahead with an operation to repair the hernia.  Exam reveals a medium-sized left internal hernia that is reducible. No evidence of hernia on the right.   Operative Findings:       The patient had an indirect left anal hernia. He had a very significant lipoma which was far down into inguinal canal but was completely reduced. A small indirect sac. He did not have any evidence of femoral or direct hernia.  Procedure in Detail:          Following the induction of general endotracheal anesthesia, the bladder was emptied with a Foley catheter. The abdomen and genitalia were prepped and draped in sterile fashion. Intravenous vancomycin was given. Surgical time out was performed. 0.5% Marcaine with epinephrine was used as a local infiltration anesthetic. A transverse incision was made just below the umbilicus. The fascia was incised transversely exposing the medial  border of the left rectus muscle. The spacemaker balloon was inserted into the left rectus sheath and in directed in the midline down to just above the symphysis pubis. The camera was inserted and the spacemaker balloon was inflated with air under direct vision. The balloon deployed reasonably well. The balloon was held in place for about 5 minutes. The balloon was then deflated and removed. The trocar balloon was inflated and the trocar secured and connected to the insufflator at 14 mm of mercury. Video cameras inserted with visualization as described above. 5 mm trocars placed in the midline below the umbilicus. Through this trocar I cleaned off the areolar tissue and pulled the peritoneum down in the right lower quadrant and inserted another 5 mm trocar. I then dissected the left inguinal area. I identified  the symphysis pubis and Cooper's ligament. I identified the peritoneum on the left side and slowly pulled back down. I had to divide the inferior epigastric vessels between metal clips because it was pulled away from the rectus muscle. I then slowly dissected out the indirect hernia and the cord structures. There  were numerous layers of areolar tissue. Ultimately I delivered a large lipoma out of the indirect position and pulled it back to the level of the anterior superior iliac spine. I identified the vas deferens and the testicular vessels. An indirect sac was which was small was  pulled back as well. I took a 3" x 6" piece of UltraPromesh and incised it on one side. This was inserted and positioned so that the slit was lateral and the solid piece of mesh was medial. The mesh was positioned so as to overlap the midline slightly, overlapped Cooper's ligament slightly. The mesh was secured to the superior rim of  Cooper's ligament with several 5 mm proTacks. The mesh was then secured up the midline and across the posterior belly of the rectus muscle. I had created a window behind the cord structures and  positioned the lower tail of the mesh down around the cord structures in the upper tail of the mesh superiorly. Superolaterally i secured the mesh with the 5 mm protector being sure to palpate the tacker through the abdominal wall. Was able to overlap the tails of the mesh and secured it with a pro-tacker. The large lipoma was tacked to the left lower quadrant abdominal wall with a pro-tacker as well. There was no bleeding. I checked for security. It appeared we had covered everything quite well the left adequate opening for the cord structures. There was some irrigation performed. There was no bleeding. I released the pneumoperitoneum. I removed the trocars. The fascia at the umbilicus was closed with figure-of-eight sutures of 0 Vicryl the skin incisions closed with subcuticular sutures of 4-0 Monocryl and Dermabond. The patient tolerated the procedure well taken recovery in stable condition. EBL 20 cc or less. Counts correct. Complications none.     Angelia Mould. Derrell Lolling, M.D., FACS General and Minimally Invasive Surgery Breast and Colorectal Surgery  02/11/2013 3:41 PM

## 2013-02-11 NOTE — Interval H&P Note (Signed)
History and Physical Interval Note:  02/11/2013 1:50 PM  Joseph Garner  has presented today for surgery, with the diagnosis of Left Inguinal Hernia  The goals and the various methods of treatment have been discussed with the patient and family. After consideration of risks, benefits and other options for treatment, the patient has consented to  Procedure(s): LEFT LAPAROSCOPIC REPAIR INGUINAL HERNIA WITH MESH (Left) INSERTION OF MESH (Left), POSSIBLE OPEN  as a surgical intervention .  The patient's history has been reviewed, patient examined today  no change in status, stable for surgery.  I have reviewed the patient's chart and labs.  Questions were answered to the patient's satisfaction.     Ernestene Mention

## 2013-02-12 ENCOUNTER — Encounter (HOSPITAL_COMMUNITY): Payer: Self-pay | Admitting: General Surgery

## 2013-02-15 ENCOUNTER — Telehealth (INDEPENDENT_AMBULATORY_CARE_PROVIDER_SITE_OTHER): Payer: Self-pay | Admitting: General Surgery

## 2013-02-15 NOTE — Telephone Encounter (Signed)
Patients daughter is aware of po f/u visit  Day and time .

## 2013-02-18 ENCOUNTER — Telehealth (INDEPENDENT_AMBULATORY_CARE_PROVIDER_SITE_OTHER): Payer: Self-pay

## 2013-02-18 NOTE — Telephone Encounter (Signed)
Pt called wanting to know how long swelling will last. Pt states he is still swollen and bruised in groin,testicle and penis. Pt states swelling occurred the night after surgery but has not gotten any larger since then. Area warm to touch and no painful. Pt advised some swelling is to be expected and can take a few weeks to completely resolve. Pt advised to use warm gentle compress on areas and avoid wound. Pt to call with any concerns or if swelling does not continue to slowly resolve.

## 2013-02-24 HISTORY — PX: HERNIA REPAIR: SHX51

## 2013-03-09 ENCOUNTER — Ambulatory Visit (INDEPENDENT_AMBULATORY_CARE_PROVIDER_SITE_OTHER): Payer: Medicare Other | Admitting: General Surgery

## 2013-03-09 ENCOUNTER — Encounter (INDEPENDENT_AMBULATORY_CARE_PROVIDER_SITE_OTHER): Payer: Self-pay | Admitting: General Surgery

## 2013-03-09 VITALS — BP 118/60 | HR 89 | Temp 98.2°F | Ht 68.0 in | Wt 165.0 lb

## 2013-03-09 DIAGNOSIS — K4090 Unilateral inguinal hernia, without obstruction or gangrene, not specified as recurrent: Secondary | ICD-10-CM

## 2013-03-09 NOTE — Patient Instructions (Signed)
You are recovering from your laparoscopic hernia surgery without any obvious complications.  The bruising should go away in 2 or 3 weeks.  You may drive your car and be sure to wear the seatbelt correctly.  increase the time that you walk every day  You may resume normal  physical activities 2 weeks from today.  Return to see Dr. Derrell Lolling if further problems arise.

## 2013-03-09 NOTE — Progress Notes (Signed)
Patient ID: Joseph Garner, male   DOB: [birth year], 79 y.o.   MRN: [id number] History: This gentleman underwent laparoscopic repair of left inguinal hernia with mesh on 02/12/2003 pain. He has done well. Lots of bruising, now subsiding. Had some pain for a few days but almost resolved. Walking about half a mile a day. No problems with appetite, bowel, or bladder.  Exam: Patient looks well. Friendly. In no distress. Wife and daughter with him. Abdomen soft and nontender. Infraumbilical incision healing normally. Some ecchymoses but no hematoma. Exam and standing both groins feel normal. Non-tender. No fluid. Penis scrotum and testes are normal.  Assessment: left inguinal hernia, recovering uneventfully following laparoscopic repair with mesh  Plan: Diet and activities discussed. I told him to wait 2 more weeks before doing very strenuous activities. I told him to walk a lot, ride a  stationary bicycle until then to build strength and flexability. Return to see me if further problems arise.   Angelia Mould. Derrell Lolling, M.D., [facility] General and Minimally invasive Surgery Breast and Colorectal Surgery Office:   [phone] Pager:   [phone]

## 2013-03-17 DIAGNOSIS — D235 Other benign neoplasm of skin of trunk: Secondary | ICD-10-CM | POA: Diagnosis not present

## 2013-03-17 DIAGNOSIS — C44319 Basal cell carcinoma of skin of other parts of face: Secondary | ICD-10-CM | POA: Diagnosis not present

## 2013-03-17 DIAGNOSIS — L570 Actinic keratosis: Secondary | ICD-10-CM | POA: Diagnosis not present

## 2013-03-17 DIAGNOSIS — C44221 Squamous cell carcinoma of skin of unspecified ear and external auricular canal: Secondary | ICD-10-CM | POA: Diagnosis not present

## 2013-03-17 DIAGNOSIS — C44320 Squamous cell carcinoma of skin of unspecified parts of face: Secondary | ICD-10-CM | POA: Diagnosis not present

## 2013-03-25 DIAGNOSIS — Z85828 Personal history of other malignant neoplasm of skin: Secondary | ICD-10-CM | POA: Diagnosis not present

## 2013-03-26 DIAGNOSIS — C44320 Squamous cell carcinoma of skin of unspecified parts of face: Secondary | ICD-10-CM | POA: Diagnosis not present

## 2013-04-01 ENCOUNTER — Telehealth (INDEPENDENT_AMBULATORY_CARE_PROVIDER_SITE_OTHER): Payer: Self-pay

## 2013-04-01 NOTE — Telephone Encounter (Signed)
The patient called with a question about his incision.  He says it feels hard at his umbilical incision.  There is no redness or drainage.  I told him this is probably the sutures under the skin and they will dissolve and smooth out but it can be normal to feel a firm knot or lumpiness.  He will call back if has any fever, redness or drainage.

## 2013-04-02 DIAGNOSIS — R42 Dizziness and giddiness: Secondary | ICD-10-CM | POA: Diagnosis not present

## 2013-04-02 DIAGNOSIS — I359 Nonrheumatic aortic valve disorder, unspecified: Secondary | ICD-10-CM | POA: Diagnosis not present

## 2013-04-16 DIAGNOSIS — I350 Nonrheumatic aortic (valve) stenosis: Secondary | ICD-10-CM

## 2013-04-16 DIAGNOSIS — I359 Nonrheumatic aortic valve disorder, unspecified: Secondary | ICD-10-CM | POA: Diagnosis not present

## 2013-04-16 DIAGNOSIS — R42 Dizziness and giddiness: Secondary | ICD-10-CM | POA: Diagnosis not present

## 2013-04-16 HISTORY — DX: Nonrheumatic aortic (valve) stenosis: I35.0

## 2013-04-20 DIAGNOSIS — I10 Essential (primary) hypertension: Secondary | ICD-10-CM | POA: Diagnosis not present

## 2013-04-20 DIAGNOSIS — I359 Nonrheumatic aortic valve disorder, unspecified: Secondary | ICD-10-CM | POA: Diagnosis not present

## 2013-04-27 ENCOUNTER — Encounter: Payer: Self-pay | Admitting: Thoracic Surgery (Cardiothoracic Vascular Surgery)

## 2013-04-27 ENCOUNTER — Institutional Professional Consult (permissible substitution) (INDEPENDENT_AMBULATORY_CARE_PROVIDER_SITE_OTHER): Payer: Medicare Other | Admitting: Thoracic Surgery (Cardiothoracic Vascular Surgery)

## 2013-04-27 VITALS — BP 138/81 | HR 88 | Resp 16 | Ht 68.0 in | Wt 165.0 lb

## 2013-04-27 DIAGNOSIS — I359 Nonrheumatic aortic valve disorder, unspecified: Secondary | ICD-10-CM | POA: Diagnosis not present

## 2013-04-27 DIAGNOSIS — I350 Nonrheumatic aortic (valve) stenosis: Secondary | ICD-10-CM

## 2013-04-27 NOTE — Progress Notes (Signed)
[address]       Joseph Garner [address]             [phone]     CARDIOTHORACIC SURGERY CONSULTATION REPORT  Referring Provider is Joseph Schultz, MD PCP is Joseph Housekeeper, MD  Chief Complaint  Patient presents with  . Aortic Stenosis    Surgical eval for aortic stenosis, ECHO 04/16/13    HPI:  Patient is an 79 year old white male from [place] with remote history of rheumatic fever during childhood and known history of aortic stenosis who has been referred by Dr. Anne Garner for evaluation of severe symptomatic aortic stenosis. The patient states that he was treated for rheumatic fever during childhood never told that he had a heart murmur until much later in life. In the past he had been followed by Dr. Demetrius Garner and apparently he underwent an echocardiogram in 2008 revealing mild aortic stenosis.  In April of this year he underwent laparoscopic preperitoneal mesh repair of symptomatic left inguinal hernia by Dr. Derrell Garner.  His postoperative course was uncomplicated, but the patient reports that he is never quite bounced back to his baseline. He describes progressive history of exertional shortness of breath as well as the development of intermittent dizzy spells without syncope. He was seen in followup by Joseph Garner and subsequently referred to Dr. Anne Garner for evaluation with transthoracic echocardiogram. This was performed 04/16/2013 and notable for the presence of severe aortic stenosis with moderate left ventricular dysfunction. Peak velocity across the aortic valve measured 4.6 m/s corresponding to peak and mean transvalvular gradients estimated to be 85 and 50 mm mercury respectively. Left ventricular ejection fraction was estimated 45-50%. There was moderate concentric left ventricular hypertrophy. The patient was subsequently referred for surgical consultation to discuss treatment options.  The patient describes a gradual progression of symptoms of exertional shortness  of breath. He now get short of breath with moderate physical activity. He states that occasionally he gets short of breath with minimal activity and at rest, but this seems to be very transient and sporadic. The patient describes occasional brief episodes of tightness across his upper chest without radiation that are transient and usually not necessarily related to physical activity. He has been having frequent dizzy spells and seemed to be somewhat positional and typically worse in the morning when he first gets up out of bed. He has not had syncope. He denies any PND or orthopnea but he has noted persistent bilateral lower extremity edema ever since his inguinal hernia repair.  Past Medical History  Diagnosis Date  . BPH (benign prostatic hypertrophy)   . Diverticulosis 2006  . Depression   . Anemia   . Spinal stenosis   . Lung nodules 2010  . Actinic keratosis, hx of   . Macular degeneration   . Hypertension   . Allergy     allergic rhinitis  . Hearing loss     wears bilateral hearing aids  . Inguinal hernia     left side -surgery planned  . GERD (gastroesophageal reflux disease)   . Arthritis     generalized-more back issues  . Severe aortic stenosis 04/16/2013    Past Surgical History  Procedure Laterality Date  . Tonsillectomy  1933  . Appendectomy  1947  . Neck surgery  1997    compressed nerve  . Skin biopsy  2003-2011    multiple   . Cataract extraction, bilateral      bilateral  . Inguinal hernia repair  Left 02/11/2013    Procedure: LEFT LAPAROSCOPIC REPAIR INGUINAL HERNIA WITH MESH;  Surgeon: Joseph Mention, MD;  Location: [facility];  Service: General;  Laterality: Left;  . Insertion of mesh Left 02/11/2013    Procedure: INSERTION OF MESH;  Surgeon: Joseph Mention, MD;  Location: [facility];  Service: General;  Laterality: Left;  . Hernia repair  02/24/13    lap LIH repair    Family History  Problem Relation Age of Onset  . Prostate cancer Father   . Cancer Mother       passed away age 39    History   Social History  . Marital Status: Married    Spouse Name: N/A    Number of Children: 1  . Years of Education: N/A   Occupational History  . retired from [employer]    Social History Main Topics  . Smoking status: Former Smoker -- 1.50 packs/day for 20 years    Types: Cigarettes    Quit date: 10/29/1967  . Smokeless tobacco: Never Used  . Alcohol Use: Yes     Comment: occasionally  . Drug Use: No  . Sexually Active: Not Currently   Other Topics Concern  . Not on file   Social History Narrative  . No narrative on file    Current Outpatient Prescriptions  Medication Sig Dispense Refill  . aspirin 81 MG tablet Take 81 mg by mouth daily.      Marland Kitchen doxazosin (CARDURA) 8 MG tablet Take 8 mg by mouth at bedtime.      . fluticasone (FLONASE) 50 MCG/ACT nasal spray Place 2 sprays into the nose daily as needed for allergies.       Marland Kitchen loratadine (CLARITIN) 10 MG tablet Take 10 mg by mouth daily as needed for allergies.       . meloxicam (MOBIC) 15 MG tablet Take 15 mg by mouth as needed for pain.      . Multiple Vitamin (MULTIVITAMIN WITH MINERALS) TABS Take 1 tablet by mouth daily.      . Multiple Vitamins-Minerals (PRESERVISION AREDS PO) Take 2 capsules by mouth daily.       Marland Kitchen omeprazole (PRILOSEC OTC) 20 MG tablet Take 20 mg by mouth daily.      . solifenacin (VESICARE) 5 MG tablet Take 5 mg by mouth daily.       No current facility-administered medications for this visit.    No Known Allergies    Review of Systems:   General:  normal appetite, decreased energy, no weight gain, + weight loss, no fever  Cardiac:  no chest pain with exertion, + occasional brief episodes of chest pain at rest, + SOB with mild to moderate exertion, occasional resting SOB, no PND, no orthopnea, no palpitations, no arrhythmia, no atrial fibrillation, + LE edema, + dizzy spells, no syncope  Respiratory:  + shortness of breath, no home oxygen, no productive cough, no  dry cough, no bronchitis, no wheezing, no hemoptysis, no asthma, no pain with inspiration or cough, no sleep apnea, no CPAP at night  GI:   no difficulty swallowing, + reflux, no frequent heartburn, no hiatal hernia, no abdominal pain, no constipation, no diarrhea, no hematochezia, no hematemesis, no melena  GU:   no dysuria,  + frequency, no urinary tract infection, no hematuria, + enlarged prostate, no kidney stones, no kidney disease  Vascular:  no pain suggestive of claudication, no pain in feet, no leg cramps, no varicose veins, no DVT, no non-healing foot  ulcer  Neuro:   no stroke, no TIA's, no seizures, no headaches, no temporary blindness one eye,  no slurred speech, no peripheral neuropathy, no chronic pain, no instability of gait, + memory/cognitive dysfunction with slowly increasing short term memory dysfunction that has not been problematic  Musculoskeletal: + arthritis, no joint swelling, no myalgias, no difficulty walking until the development of dizzy spells, normal mobility   Skin:    rash, no itching, no skin infections, no pressure sores or ulcerations  Psych:   + anxiety, no depression, + nervousness, no unusual recent stress  Eyes:   no blurry vision, + floaters, no recent vision changes, + wears glasses or contacts  ENT:   + hearing loss, no loose or painful teeth, no dentures, last saw dentist January 2014  Hematologic:  + easy bruising, no abnormal bleeding, no clotting disorder, no frequent epistaxis  Endocrine:  no diabetes, does not check CBG's at home     Physical Exam:   BP 138/81  Pulse 88  Resp 16  Ht 5\' 8"  (1.727 m)  Wt 165 lb (74.844 kg)  BMI 25.09 kg/m2  SpO2 98%  General:  Elderly but o/w  well-appearing  HEENT:  Unremarkable   Neck:   no JVD, no bruits, no adenopathy   Chest:   clear to auscultation, symmetrical breath sounds, no wheezes, no rhonchi   CV:   RRR, grade IV/VI crescendo/decrescendo systolic murmur   Abdomen:  soft, non-tender, no masses    Extremities:  warm, well-perfused, pulses diminished but palpable in groin, + mild bilateral LE edema  Rectal/GU  Deferred  Neuro:   Grossly non-focal and symmetrical throughout  Skin:   Clean and dry, no rashes, no breakdown   Diagnostic Tests:  TRANSTHORACIC ECHOCARDIOGRAM  Both the images and the report of transthoracic echocardiogram performed 04/16/2013 at [facility] cardiology are reviewed.  There is severe aortic stenosis. The aortic valve is tricuspid and all 3 leaflets are severely thickened and moderately calcified with severely restricted leaflet motion. The peak velocity measured across the aortic valve was 4.6 m/s corresponding to peak and mean transvalvular gradients estimated to be 85 and 50 mm mercury respectively. There is at least mild aortic insufficiency. The left ventricular output tract diameter measured 2.3 cm and the aortic root appeared slightly dilated. There is moderate global left ventricular dysfunction with moderate left ventricular hypertrophy. Ejection fraction was estimated to be 45-50%, but in some views it appeared lower than that. There was mild mitral regurgitation, mild tricuspid regurgitation and a trivial pericardial effusion.    STS Risk Calculator  Procedure    AVR (assuming no need for CABG  Risk of Mortality   3.4% Morbidity or Mortality  19.3% Prolonged LOS   7.9% Short LOS    26.4% Permanent Stroke   1.9% Prolonged Vent Support  10.6% DSW Infection    0.3% Renal Failure    5.2% Reoperation    8.6%   Impression:  The patient has severe symptomatic aortic stenosis with progressive symptoms of exertional shortness of breath and occasional mild chest discomfort with recent development of frequent dizzy spells without syncope. Left ventricular function is mild to moderately reduced. Risks associated with conventional surgical aortic valve replacement would probably be in a moderate risk category because of the patient's advanced age and mild  baseline cognitive dysfunction, although left and right heart catheterization and imaging of the patient's thoracic aorta has yet to be performed.   Plan:  The patient and his family were  counseled at length regarding treatment alternatives for management of severe symptomatic aortic stenosis. Alternative approaches such as conventional aortic valve replacement, transcatheter aortic valve replacement, and palliative medical therapy were compared and contrasted at length.  The risks associated with both conventional surgical aortic valve replacement and TAVR were discussed in detail, as were expectations for his post-operative convalescence.  Long-term prognosis with medical therapy was discussed. This discussion was placed in the context of the patient's own specific clinical presentation and past medical history.  The patient desires to think matters over further before making a final decision as to whether or not to proceed with further diagnostic evaluation. If he chooses to proceed with some type of surgical intervention, the next step will be for him to undergo left and right heart catheterization by Dr. Anne Garner. Once that has been completed we will obtain CT angiogram of the chest abdomen and pelvis to assess for the presence of significant calcification and/or aneurysmal disease involving the ascending thoracic aorta as well as to explore potential options for vascular access.  He will also need to undergo pulmonary function tests and a baseline physical therapy examination with 6 minute walk test.  The patient will notify us within the next few days whether or not he desires to proceed with further diagnostic testing as outlined.  All of their questions been addressed.       Joseph Decent. Cornelius Moras, MD 04/27/2013 3:57 PM

## 2013-04-28 ENCOUNTER — Encounter: Payer: Medicare Other | Admitting: Cardiothoracic Surgery

## 2013-04-29 DIAGNOSIS — I10 Essential (primary) hypertension: Secondary | ICD-10-CM | POA: Diagnosis not present

## 2013-04-29 DIAGNOSIS — I359 Nonrheumatic aortic valve disorder, unspecified: Secondary | ICD-10-CM | POA: Diagnosis not present

## 2013-05-03 ENCOUNTER — Other Ambulatory Visit: Payer: Self-pay | Admitting: *Deleted

## 2013-05-03 DIAGNOSIS — I359 Nonrheumatic aortic valve disorder, unspecified: Secondary | ICD-10-CM

## 2013-05-05 DIAGNOSIS — I359 Nonrheumatic aortic valve disorder, unspecified: Secondary | ICD-10-CM | POA: Diagnosis not present

## 2013-05-06 ENCOUNTER — Other Ambulatory Visit: Payer: Self-pay | Admitting: Cardiology

## 2013-05-12 ENCOUNTER — Encounter (HOSPITAL_BASED_OUTPATIENT_CLINIC_OR_DEPARTMENT_OTHER): Payer: Self-pay

## 2013-05-12 ENCOUNTER — Inpatient Hospital Stay (HOSPITAL_BASED_OUTPATIENT_CLINIC_OR_DEPARTMENT_OTHER)
Admission: RE | Admit: 2013-05-12 | Discharge: 2013-05-12 | Disposition: A | Discharge status: Home / Self Care | Payer: Medicare Other | Source: Ambulatory Visit | Attending: Cardiology | Admitting: Cardiology

## 2013-05-12 ENCOUNTER — Encounter (HOSPITAL_BASED_OUTPATIENT_CLINIC_OR_DEPARTMENT_OTHER)
Admission: RE | Disposition: A | Discharge status: Home / Self Care | Payer: Self-pay | Source: Ambulatory Visit | Attending: Cardiology

## 2013-05-12 DIAGNOSIS — I359 Nonrheumatic aortic valve disorder, unspecified: Secondary | ICD-10-CM | POA: Insufficient documentation

## 2013-05-12 DIAGNOSIS — I350 Nonrheumatic aortic (valve) stenosis: Secondary | ICD-10-CM | POA: Diagnosis present

## 2013-05-12 DIAGNOSIS — I10 Essential (primary) hypertension: Secondary | ICD-10-CM | POA: Insufficient documentation

## 2013-05-12 LAB — POCT I-STAT 3, VENOUS BLOOD GAS (G3P V)
Bicarbonate: 25.5 mEq/L — ABNORMAL HIGH (ref 20.0–24.0)
TCO2: 27 mmol/L (ref 0–100)
pO2, Ven: 39 mmHg (ref 30.0–45.0)

## 2013-05-12 LAB — POCT I-STAT 3, ART BLOOD GAS (G3+)
Bicarbonate: 24.3 mEq/L — ABNORMAL HIGH (ref 20.0–24.0)
TCO2: 26 mmol/L (ref 0–100)
pCO2 arterial: 42.7 mmHg (ref 35.0–45.0)
pH, Arterial: 7.364 (ref 7.350–7.450)
pO2, Arterial: 72 mmHg — ABNORMAL LOW (ref 80.0–100.0)

## 2013-05-12 SURGERY — JV LEFT AND RIGHT HEART CATHETERIZATION WITH CORONARY ANGIOGRAM

## 2013-05-12 MED ORDER — SODIUM CHLORIDE 0.9 % IV SOLN: 250.0000 mL | INTRAVENOUS | Status: DC | PRN

## 2013-05-12 MED ORDER — SODIUM CHLORIDE 0.9 % IV SOLN: 1.0000 mL/kg/h | INTRAVENOUS | Status: DC

## 2013-05-12 MED ORDER — ASPIRIN 81 MG PO CHEW
324.0000 mg | CHEWABLE_TABLET | ORAL | Status: AC
  Administered 2013-05-12: 324 mg via ORAL

## 2013-05-12 MED ORDER — ACETAMINOPHEN 325 MG PO TABS: 650.0000 mg | ORAL_TABLET | ORAL | Status: DC | PRN

## 2013-05-12 MED ORDER — ONDANSETRON HCL 4 MG/2ML IJ SOLN: 4.0000 mg | Freq: Four times a day (QID) | INTRAMUSCULAR | Status: DC | PRN

## 2013-05-12 MED ORDER — SODIUM CHLORIDE 0.9 % IJ SOLN: 3.0000 mL | Freq: Two times a day (BID) | INTRAMUSCULAR | Status: DC

## 2013-05-12 MED ORDER — SODIUM CHLORIDE 0.9 % IV SOLN
INTRAVENOUS | Status: DC
  Administered 2013-05-12: 10:00:00 via INTRAVENOUS

## 2013-05-12 MED ORDER — DIAZEPAM 5 MG PO TABS
5.0000 mg | ORAL_TABLET | ORAL | Status: AC
  Administered 2013-05-12: 5 mg via ORAL

## 2013-05-12 MED ORDER — SODIUM CHLORIDE 0.9 % IJ SOLN: 3.0000 mL | INTRAMUSCULAR | Status: DC | PRN

## 2013-05-12 NOTE — H&P (Signed)
Patient: Joseph Garner, Cham Account Number: [id number] Provider: Donato Schultz, MD  DOB: Jun 02, 1924 Age: 79 Y Sex: Male Date: 04/29/2013  Phone: [phone]   Address: [address] [place], [place]  Pcp: Jerelyn Scott HUSAIN          1. MS/cath work up.        HPI:  General:  79 year old here for evaluation of severe aortic stenosis on echocardiogram 04/16/13. Dr. Eula Listen was concerned about cessation of benign positional vertigo. He also thought it could been exacerbated by aortic stenosis. In 2008 he had only mild aortic stenosis. Increased dyspnea on exertion. (Has been blaming this on recent hernia repair). Mild chest pressure with exertion noted, mid chest no radiation, relieved with rest. No syncope. No stroke.   Had rheumatic fever growing up. He says it left him with an enlarged heart. No prior MI. Dr. Demetrius Revel in the past has seen him. Has had NUC stress in the past (normal) 2008.  He is quite active for his age. He is married, does the majority of driving, has one daughter in [place] city.  Discussed with Dr. Cornelius Moras, wishes to proceed with surgery..        ROS:  No bleeding, no syncope, no orthopnea, no PND. Positive dyspnea. No chest pain. All other review of systems negative.       Medical History: BPH, Dr Windell Moulding, Diverticulosis; colon 2006, Depression, Anemia, Mild AS, MR, Echo 2008 now severe 2014, Stress test negative in 2008, Spinal Stenosis, MRI 2008, Lung Nodule on Ct, PET Scan done 3/10; right lung nodule repeat PET scan in September 2010 negative, Atypical R CP, CT chest negative, 3/10, actinic keratosis skin lesions treated by dermatologist, Dr. Jarold Motto, Macular degeneration Dr Eulah Pont optho, BPV , Hypertension mild, Allergic Rhinitis, Urology- Dr Leeanne Mannan, Optho- Dr Eulah Pont, Derm-Dr Margo Aye, Hearing loss- bilateral hearing aids.        Surgical History: appendectomy , cervical discectomy, C5-C6 1997, bilateral cataract 2008, Hernia repair - Dr. Derrell Lolling  02/11/2013.        Family History: Father: deceased, Prostate cancerMother: deceased 35 yrsBrother 1: deceased, Died in for warBrother2: deceased       Social History:  General:  Tobacco use  cigarettes: Former smoker Quit in year 1967 Pack-year Hx: 46 no Smoking.  no Tobacco Exposure.  no Alcohol.  Caffeine: yes.  no Recreational drug use.  no Diet.  Exercise: yes.  Occupation: unemployed, retired in Photographer.  Marital Status: married.  Children: girls, 1.  Seat belt use: yes.        Medications: Taking Multivitamins Tablet as directed daily, Taking Aspirin 81 MG Tablet 1 tablet Once a day, Taking Prilosec OTC 20 MG Tablet Delayed Release 1 tablet Once a day, Taking VESIcare 5 MG Tablet 1 tablet Once a day, Taking Mobic 15 MG Tablet 1 tablet as needed, Taking Claritin 10 MG Tablet 1 tablet Once a day, Taking Meclizine HCl 12.5 MG Tablet 1 tablet three times a day as needed, Taking Fluticasone Propionate 50 MCG/ACT Suspension 2 sprays Nasally Once a day Once a day, Taking Doxazosin Mesylate 8 MG Tablet 1/2 tablet at bedtime, Taking PreserVision AREDS Capsule , Medication List reviewed and reconciled with the patient       Allergies: N.K.D.A.          Vitals: Wt 162, Wt change -1 lb, Ht 67, BMI 25.37, Pulse sitting 76, BP sitting 146/74.       Examination:  General Examination:  GENERAL APPEARANCE alert, oriented, NAD,  pleasant, looks younger than stated age.  SKIN: normal, no rash.  HEENT: normal.  HEAD: [place]/AT.  EYES: EOMI, Conjunctiva clear.  NECK: supple, FROM, without evidence of thyromegaly, adenopathy, radiation of aortic valve murmur to carotids bruits, no jugular venous distention (JVD)..  LUNGS: clear to auscultation bilaterally, no wheezes, rhonchi, rales, regular breathing rate and effort.  HEART: regular rate and rhythm, no S3, S4, 3/6 SM RUSB with mild diastolic component. No rub, point of maximul impulse (PMI) normal.  ABDOMEN: soft, non-tender/non-distended,  bowel sounds present, no masses palpated, no bruit.  EXTREMITIES: no clubbing, trace edema, pulses 2 plus bilaterally.  NEUROLOGIC EXAM: non-focal exam, alert and oriented x 3.  PERIPHERAL PULSES: normal (2+) bilaterally.  LYMPH NODES: no cervical adenopathy.  PSYCH affect normal.  Prior echocardiogram reviewed.           Assessment:  1. Aortic stenosis - 424.1 (Primary)  2. Essential hypertension, benign - 401.1, Blood pressure doing well, aortic stenosis, stable without any symptoms        1. Aortic stenosis  Notes: Risks and benefits of cardiac catheterization have been reviewed including risk of stroke, heart attack, death, bleeding, renal impariment and arterial damage. There was ample oppurtuny to answer questions. Alternatives were discussed. Patient understands and wishes to proceed. I will proceed with left groin approach, right and left heart catheterization with abdominal aortogram. Dr. Cornelius Moras.        Follow Up: post op follow up

## 2013-05-12 NOTE — CV Procedure (Signed)
CARDIAC CATHETERIZATION  PROCEDURE:  Right heart catheterization with selective coronary angiography, cardiac output, selective oxygen saturations, abdominal aortogram.  INDICATIONS:  79 year old male with severe aortic stenosis, symptomatic, preoperative angiogram.  The risks, benefits, and details of the procedure were explained to the patient, including stroke, heart attack, death, renal impairment, arterial damage, bleeding.  The patient verbalized understanding and wanted to proceed.  Informed written consent was obtained.  PROCEDURE TECHNIQUE:  After Xylocaine anesthesia and visualization of the femoral head via fluoroscopy, a 49F sheath was placed in the right femoral artery using the modified Seldinger technique.  A 7 French sheath was inserted into the right femoral vein using the modified Seldinger technique. Left coronary angiography was done using a Judkins L4 catheter.  Right coronary angiography was done using a no torque Williams right catheter. Multiple views with hand injection of Omnipaque were obtained. Abdominal aortogram was performed with pigtail catheter in descending aorta. Left ventriculogram was not performed. Aortic valve was not crossed. Right heart catheterization was performed with a balloon tipped Swan-Ganz catheter traversing the right-sided heart structures to the wedge position. Oxygen saturation was drawn in the pulmonary artery as well as femoral artery. Hemodynamics were obtained. Cardiac output was obtained utilizing the Fick methods. Following the procedure, sheaths were removed and patient was hemodynamically stable.   CONTRAST:  Total of 80 ml.  FLOUROSCOPY TIME: 7.4 minutes.  COMPLICATIONS:  None.    HEMODYNAMICS:    Right atrium (RA): 4/2/1 mmHg Right ventricle (RV): 17/1 mmHg Pulmonary artery (PA): 17/6/9 mmHg Pulmonary capillary wedge pressure (PCWP): 11/11/8 mmHg  Cardiac output: Fick 5.6 liters/min Cardiac index: Fick 3  PA saturation: 74% FA  saturation: 94%   Aortic pressure: 130/54/82 mmHg     ANGIOGRAPHIC DATA:    Left main: Minor tapering of the left main at the bifurcation of the LAD and circumflex artery. No flow-limiting coronary artery disease.  Left anterior descending (LAD): There is significant proximal and mid vessel calcification. There 3 significant diagonal branches. The first diagonal branch is small in caliber and demonstrates proximal 80% stenosis. The remainder of the vessel fills adequately. In the mid LAD, there is a bifurcation diagonal. At this juncture, there is approximately 30-40 % stenosis, but does not appear to be flow-limiting.  Circumflex artery (CIRC): There is a high first obtuse marginal/ramus branch which demonstrates a proximal 80% stenosis. This vessel then bifurcates. Fairly small caliber vessel as well. The native circumflex/large obtuse marginal demonstrates 20-30% stenosis ostially, 50-60% stenosis in the midsegment which do not appear to be flow-limiting.  Right coronary artery (RCA): Dominant vessel giving rise to the posterior descending artery. Minor proximal calcification. Minor luminal irregularities. No flow limiting CAD present.  LEFT VENTRICULOGRAM: Not performed    Abdominal aortogram:  No abnormal aortic aneurysm, widely patent iliac vessels as well as common femoral vessels.  IMPRESSIONS:  1. No proximal large vessel coronary artery disease. Calcified LAD with moderate CAD present, midsegment at the bifurcation of third diagonal. Proximal diagonal, small caliber vessel, stenosis of 80%. High obtuse marginal stenosis of approximately 80%, fairly small caliber vessel. 2. No abdominal aortic aneurysm. Iliac vessels as well as common femoral vessels appear normal. 3. Normal right-sided heart pressures. 4. Normal cardiac output.  RECOMMENDATION:  Will relay to Dr. Cornelius Moras. Proceed with aortic valve replacement. One can contemplate revascularization of these branching vessels as  described above however the risk versus the benefit of added time during procedure as well as feasibility of bypass on such small caliber  vessels has to be weighed. It would not be unreasonable to continue with medical management of this coronary disease.

## 2013-05-12 NOTE — Interval H&P Note (Signed)
History and Physical Interval Note:  05/12/2013 11:12 AM  Joseph Garner  has presented today for surgery, with the diagnosis of cp  The various methods of treatment have been discussed with the patient and family. After consideration of risks, benefits and other options for treatment, the patient has consented to  Procedure(s): JV LEFT AND RIGHT HEART CATHETERIZATION WITH CORONARY ANGIOGRAM (N/A) as a surgical intervention .  The patient's history has been reviewed, patient examined, no change in status, stable for surgery.  I have reviewed the patient's chart and labs.  Questions were answered to the patient's satisfaction.     Delon Revelo

## 2013-05-12 NOTE — H&P (View-Only) (Signed)
  Patient: Joseph Garner, Joseph Garner Account Number: [id number] Provider: Mark Skains, MD  DOB: 10/09/1924 Age: 79 Y Sex: Male Date: 04/29/2013  Phone: [phone]   Address: [address], [place], [place]  Pcp: KARRAR HUSAIN          1. MS/cath work up.        HPI:  General:  79-year-old here for evaluation of severe aortic stenosis on echocardiogram 04/16/13. Dr. Hussain was concerned about cessation of benign positional vertigo. He also thought it could been exacerbated by aortic stenosis. In 2008 he had only mild aortic stenosis. Increased dyspnea on exertion. (Has been blaming this on recent hernia repair). Mild chest pressure with exertion noted, mid chest no radiation, relieved with rest. No syncope. No stroke.   Had rheumatic fever growing up. He says it left him with an enlarged heart. No prior MI. Dr. Ruskin in the past has seen him. Has had NUC stress in the past (normal) 2008.  He is quite active for his age. He is married, does the majority of driving, has one daughter in [place].  Discussed with Dr. Owen, wishes to proceed with surgery..        ROS:  No bleeding, no syncope, no orthopnea, no PND. Positive dyspnea. No chest pain. All other review of systems negative.       Medical History: BPH, Dr Daldsdht, Diverticulosis; colon 2006, Depression, Anemia, Mild AS, MR, Echo 2008 now severe 2014, Stress test negative in 2008, Spinal Stenosis, MRI 2008, Lung Nodule on Ct, PET Scan done 3/10; right lung nodule repeat PET scan in September 2010 negative, Atypical R CP, CT chest negative, 3/10, actinic keratosis skin lesions treated by dermatologist, Dr. Patterson, Macular degeneration Dr Cashwell optho, BPV , Hypertension mild, Allergic Rhinitis, Urology- Dr Dalsdht, Optho- Dr cashwell, Derm-Dr Hall, Hearing loss- bilateral hearing aids.        Surgical History: appendectomy , cervical discectomy, C5-C6 1997, bilateral cataract 2008, Hernia repair - Dr. Ingram  02/11/2013.        Family History: Father: deceased, Prostate cancerMother: deceased 101 yrsBrother 1: deceased, Died in for warBrother2: deceased       Social History:  General:  Tobacco use  cigarettes: Former smoker Quit in year 1967 Pack-year Hx: 46 no Smoking.  no Tobacco Exposure.  no Alcohol.  Caffeine: yes.  no Recreational drug use.  no Diet.  Exercise: yes.  Occupation: unemployed, retired in banking.  Marital Status: married.  Children: girls, 1.  Seat belt use: yes.        Medications: Taking Multivitamins Tablet as directed daily, Taking Aspirin 81 MG Tablet 1 tablet Once a day, Taking Prilosec OTC 20 MG Tablet Delayed Release 1 tablet Once a day, Taking VESIcare 5 MG Tablet 1 tablet Once a day, Taking Mobic 15 MG Tablet 1 tablet as needed, Taking Claritin 10 MG Tablet 1 tablet Once a day, Taking Meclizine HCl 12.5 MG Tablet 1 tablet three times a day as needed, Taking Fluticasone Propionate 50 MCG/ACT Suspension 2 sprays Nasally Once a day Once a day, Taking Doxazosin Mesylate 8 MG Tablet 1/2 tablet at bedtime, Taking PreserVision AREDS Capsule , Medication List reviewed and reconciled with the patient       Allergies: N.K.D.A.          Vitals: Wt 162, Wt change -1 lb, Ht 67, BMI 25.37, Pulse sitting 76, BP sitting 146/74.       Examination:  General Examination:  GENERAL APPEARANCE alert, oriented, NAD,   pleasant, looks younger than stated age.  SKIN: normal, no rash.  HEENT: normal.  HEAD: [place]/AT.  EYES: EOMI, Conjunctiva clear.  NECK: supple, FROM, without evidence of thyromegaly, adenopathy, radiation of aortic valve murmur to carotids bruits, no jugular venous distention (JVD)..  LUNGS: clear to auscultation bilaterally, no wheezes, rhonchi, rales, regular breathing rate and effort.  HEART: regular rate and rhythm, no S3, S4, 3/6 SM RUSB with mild diastolic component. No rub, point of maximul impulse (PMI) normal.  ABDOMEN: soft, non-tender/non-distended,  bowel sounds present, no masses palpated, no bruit.  EXTREMITIES: no clubbing, trace edema, pulses 2 plus bilaterally.  NEUROLOGIC EXAM: non-focal exam, alert and oriented x 3.  PERIPHERAL PULSES: normal (2+) bilaterally.  LYMPH NODES: no cervical adenopathy.  PSYCH affect normal.  Prior echocardiogram reviewed.           Assessment:  1. Aortic stenosis - 424.1 (Primary)  2. Essential hypertension, benign - 401.1, Blood pressure doing well, aortic stenosis, stable without any symptoms        1. Aortic stenosis  Notes: Risks and benefits of cardiac catheterization have been reviewed including risk of stroke, heart attack, death, bleeding, renal impariment and arterial damage. There was ample oppurtuny to answer questions. Alternatives were discussed. Patient understands and wishes to proceed. I will proceed with left groin approach, right and left heart catheterization with abdominal aortogram. Dr. Owen.        Follow Up: post op follow up          

## 2013-05-12 NOTE — Progress Notes (Signed)
Bedrest begins @ 1200, tegaderm dressing applied to right groin site by Marsha Wright, site level 0. 

## 2013-05-18 ENCOUNTER — Ambulatory Visit (HOSPITAL_COMMUNITY): Payer: Medicare Other

## 2013-05-18 ENCOUNTER — Encounter (HOSPITAL_COMMUNITY): Payer: Medicare Other

## 2013-05-19 DIAGNOSIS — H8110 Benign paroxysmal vertigo, unspecified ear: Secondary | ICD-10-CM | POA: Diagnosis not present

## 2013-05-19 DIAGNOSIS — I951 Orthostatic hypotension: Secondary | ICD-10-CM | POA: Diagnosis not present

## 2013-05-19 DIAGNOSIS — I359 Nonrheumatic aortic valve disorder, unspecified: Secondary | ICD-10-CM | POA: Diagnosis not present

## 2013-05-20 ENCOUNTER — Ambulatory Visit (HOSPITAL_COMMUNITY): Admission: RE | Admit: 2013-05-20 | Payer: Medicare Other | Source: Ambulatory Visit

## 2013-05-20 ENCOUNTER — Encounter (HOSPITAL_COMMUNITY): Payer: Self-pay

## 2013-05-20 ENCOUNTER — Ambulatory Visit (HOSPITAL_COMMUNITY)
Admission: RE | Admit: 2013-05-20 | Discharge: 2013-05-20 | Disposition: A | Discharge status: Home / Self Care | Payer: Medicare Other | Source: Ambulatory Visit | Attending: Thoracic Surgery (Cardiothoracic Vascular Surgery) | Admitting: Thoracic Surgery (Cardiothoracic Vascular Surgery)

## 2013-05-20 DIAGNOSIS — Z954 Presence of other heart-valve replacement: Secondary | ICD-10-CM | POA: Insufficient documentation

## 2013-05-20 DIAGNOSIS — I2510 Atherosclerotic heart disease of native coronary artery without angina pectoris: Secondary | ICD-10-CM | POA: Insufficient documentation

## 2013-05-20 DIAGNOSIS — N400 Enlarged prostate without lower urinary tract symptoms: Secondary | ICD-10-CM | POA: Insufficient documentation

## 2013-05-20 DIAGNOSIS — K5730 Diverticulosis of large intestine without perforation or abscess without bleeding: Secondary | ICD-10-CM | POA: Insufficient documentation

## 2013-05-20 DIAGNOSIS — Q676 Pectus excavatum: Secondary | ICD-10-CM | POA: Diagnosis not present

## 2013-05-20 DIAGNOSIS — N289 Disorder of kidney and ureter, unspecified: Secondary | ICD-10-CM | POA: Insufficient documentation

## 2013-05-20 DIAGNOSIS — I708 Atherosclerosis of other arteries: Secondary | ICD-10-CM | POA: Diagnosis not present

## 2013-05-20 DIAGNOSIS — R0989 Other specified symptoms and signs involving the circulatory and respiratory systems: Secondary | ICD-10-CM | POA: Insufficient documentation

## 2013-05-20 DIAGNOSIS — I359 Nonrheumatic aortic valve disorder, unspecified: Secondary | ICD-10-CM

## 2013-05-20 DIAGNOSIS — R0609 Other forms of dyspnea: Secondary | ICD-10-CM | POA: Diagnosis not present

## 2013-05-20 DIAGNOSIS — I77810 Thoracic aortic ectasia: Secondary | ICD-10-CM | POA: Diagnosis not present

## 2013-05-20 DIAGNOSIS — I517 Cardiomegaly: Secondary | ICD-10-CM | POA: Insufficient documentation

## 2013-05-20 DIAGNOSIS — R911 Solitary pulmonary nodule: Secondary | ICD-10-CM | POA: Diagnosis not present

## 2013-05-20 DIAGNOSIS — R188 Other ascites: Secondary | ICD-10-CM | POA: Diagnosis not present

## 2013-05-20 DIAGNOSIS — I70209 Unspecified atherosclerosis of native arteries of extremities, unspecified extremity: Secondary | ICD-10-CM | POA: Diagnosis not present

## 2013-05-20 LAB — PULMONARY FUNCTION TEST

## 2013-05-20 MED ORDER — IOHEXOL 350 MG/ML SOLN
100.0000 mL | Freq: Once | INTRAVENOUS | Status: AC | PRN
  Administered 2013-05-20: 100 mL via INTRAVENOUS

## 2013-05-21 ENCOUNTER — Other Ambulatory Visit: Payer: Self-pay

## 2013-05-21 DIAGNOSIS — D381 Neoplasm of uncertain behavior of trachea, bronchus and lung: Secondary | ICD-10-CM

## 2013-05-24 ENCOUNTER — Ambulatory Visit (INDEPENDENT_AMBULATORY_CARE_PROVIDER_SITE_OTHER): Payer: Medicare Other | Admitting: Thoracic Surgery (Cardiothoracic Vascular Surgery)

## 2013-05-24 ENCOUNTER — Encounter: Payer: Self-pay | Admitting: Thoracic Surgery (Cardiothoracic Vascular Surgery)

## 2013-05-24 VITALS — BP 114/60 | HR 71 | Resp 16 | Ht 68.0 in | Wt 162.0 lb

## 2013-05-24 DIAGNOSIS — R911 Solitary pulmonary nodule: Secondary | ICD-10-CM | POA: Diagnosis not present

## 2013-05-24 DIAGNOSIS — Q859 Phakomatosis, unspecified: Secondary | ICD-10-CM | POA: Insufficient documentation

## 2013-05-24 DIAGNOSIS — I359 Nonrheumatic aortic valve disorder, unspecified: Secondary | ICD-10-CM | POA: Diagnosis not present

## 2013-05-24 DIAGNOSIS — I350 Nonrheumatic aortic (valve) stenosis: Secondary | ICD-10-CM

## 2013-05-24 HISTORY — DX: Solitary pulmonary nodule: R91.1

## 2013-05-24 NOTE — Progress Notes (Signed)
[address]       [name] [address]             [phone]     CARDIOTHORACIC SURGERY OFFICE NOTE  Referring Provider is Donato Schultz, MD PCP is Georgann Housekeeper, MD   HPI:  Patient returns for followup of severe symptomatic aortic stenosis. He was originally seen in consultation on 04/27/2013. Since then he underwent left and right heart catheterization by Dr. Anne Fu on 05/12/2013. Cardiac catheterization was notable for moderate nonobstructive coronary artery disease with 80% stenosis of a small first diagonal branch off of the left anterior descending coronary artery and otherwise no significant flow limiting coronary artery disease. Pulmonary artery pressures and cardiac output were normal. Left ventriculogram was not performed and the transvalvular gradient across the aortic valve was not assessed.  CT angiogram of the chest abdomen and pelvis was notable for small interval increase in size of right lung nodule which had been seen previously in 2010.  The patient returns to the office today with his wife and daughter to review the results of his tests and further discuss treatment options. The patient reports overall remaining clinically stable. He has occasional vague episodes of tightness across his chest with prominent exertional shortness of breath as well as frequent dizzy spells. He apparently had recently increased his dose of Cardura to 8 mg daily, and with this he had more frequent dizzy spells. He has now cut this back to 4 mg daily and seems to be doing better. He has never had any syncopal episodes.  The remainder of his review of systems is unchanged from previously.   Current Outpatient Prescriptions  Medication Sig Dispense Refill  . aspirin 81 MG tablet Take 81 mg by mouth daily.      Marland Kitchen doxazosin (CARDURA) 8 MG tablet Take 8 mg by mouth at bedtime.      . fluticasone (FLONASE) 50 MCG/ACT nasal spray Place 2 sprays into the nose daily as needed for  allergies.       Marland Kitchen loratadine (CLARITIN) 10 MG tablet Take 10 mg by mouth daily as needed for allergies.       . meloxicam (MOBIC) 15 MG tablet Take 15 mg by mouth as needed for pain.      . Multiple Vitamin (MULTIVITAMIN WITH MINERALS) TABS Take 1 tablet by mouth daily.      . Multiple Vitamins-Minerals (PRESERVISION AREDS PO) Take 2 capsules by mouth daily.       Marland Kitchen omeprazole (PRILOSEC OTC) 20 MG tablet Take 20 mg by mouth daily.      . solifenacin (VESICARE) 5 MG tablet Take 5 mg by mouth daily.       No current facility-administered medications for this visit.      Physical Exam:   BP 114/60  Pulse 71  Resp 16  Ht 5\' 8"  (1.727 m)  Wt 162 lb (73.483 kg)  BMI 24.64 kg/m2  SpO2 98%  General:  Well appearing  Chest:   clear  CV:   RRR w/ loud systolic murmur  Incisions:  n/a  Abdomen:  soft  Extremities:  warm  Diagnostic Tests:  CARDIAC CATHETERIZATION   PROCEDURE: Right heart catheterization with selective coronary angiography, cardiac output, selective oxygen saturations, abdominal aortogram.  INDICATIONS: 79 year old male with severe aortic stenosis, symptomatic, preoperative angiogram.  The risks, benefits, and details of the procedure were explained to the patient, including stroke, heart attack, death, renal impairment, arterial damage, bleeding. The  patient verbalized understanding and wanted to proceed. Informed written consent was obtained.  PROCEDURE TECHNIQUE: After Xylocaine anesthesia and visualization of the femoral head via fluoroscopy, a 37F sheath was placed in the right femoral artery using the modified Seldinger technique. A 7 French sheath was inserted into the right femoral vein using the modified Seldinger technique. Left coronary angiography was done using a Judkins L4 catheter. Right coronary angiography was done using a no torque Williams right catheter. Multiple views with hand injection of Omnipaque were obtained. Abdominal aortogram was performed with  pigtail catheter in descending aorta. Left ventriculogram was not performed. Aortic valve was not crossed. Right heart catheterization was performed with a balloon tipped Swan-Ganz catheter traversing the right-sided heart structures to the wedge position. Oxygen saturation was drawn in the pulmonary artery as well as femoral artery. Hemodynamics were obtained. Cardiac output was obtained utilizing the Fick methods. Following the procedure, sheaths were removed and patient was hemodynamically stable.  CONTRAST: Total of 80 ml.  FLOUROSCOPY TIME: 7.4 minutes.  COMPLICATIONS: None.  HEMODYNAMICS:  Right atrium (RA): 4/2/1 mmHg  Right ventricle (RV): 17/1 mmHg  Pulmonary artery (PA): 17/6/9 mmHg  Pulmonary capillary wedge pressure (PCWP): 11/11/8 mmHg  Cardiac output: Fick 5.6 liters/min  Cardiac index: Fick 3  PA saturation: 74%  FA saturation: 94%  Aortic pressure: 130/54/82 mmHg  ANGIOGRAPHIC DATA:  Left main: Minor tapering of the left main at the bifurcation of the LAD and circumflex artery. No flow-limiting coronary artery disease.  Left anterior descending (LAD): There is significant proximal and mid vessel calcification. There 3 significant diagonal branches. The first diagonal branch is small in caliber and demonstrates proximal 80% stenosis. The remainder of the vessel fills adequately. In the mid LAD, there is a bifurcation diagonal. At this juncture, there is approximately 30-40 % stenosis, but does not appear to be flow-limiting.  Circumflex artery (CIRC): There is a high first obtuse marginal/ramus branch which demonstrates a proximal 80% stenosis. This vessel then bifurcates. Fairly small caliber vessel as well. The native circumflex/large obtuse marginal demonstrates 20-30% stenosis ostially, 50-60% stenosis in the midsegment which do not appear to be flow-limiting.  Right coronary artery (RCA): Dominant vessel giving rise to the posterior descending artery. Minor proximal  calcification. Minor luminal irregularities. No flow limiting CAD present.  LEFT VENTRICULOGRAM: Not performed  Abdominal aortogram:  No abnormal aortic aneurysm, widely patent iliac vessels as well as common femoral vessels.  IMPRESSIONS:  1. No proximal large vessel coronary artery disease. Calcified LAD with moderate CAD present, midsegment at the bifurcation of third diagonal. Proximal diagonal, small caliber vessel, stenosis of 80%. High obtuse marginal stenosis of approximately 80%, fairly small caliber vessel. 2. No abdominal aortic aneurysm. Iliac vessels as well as common femoral vessels appear normal. 3. Normal right-sided heart pressures. 4. Normal cardiac output. RECOMMENDATION: Will relay to Dr. Cornelius Moras. Proceed with aortic valve replacement. One can contemplate revascularization of these branching vessels as described above however the risk versus the benefit of added time during procedure as well as feasibility of bypass on such small caliber vessels has to be weighed. It would not be unreasonable to continue with medical management of this coronary disease.      CT ANGIOGRAPHY CHEST, ABDOMEN AND PELVIS  Technique: Multidetector CT imaging through the chest, abdomen and  pelvis was performed using the standard protocol during bolus  administration of intravenous contrast. Multiplanar reconstructed  images including MIPs were obtained and reviewed to evaluate the  vascular anatomy.  Contrast:  OMNIPAQUE IOHEXOL 350 MG/ML SOLN  Comparison: PET CT 07/14/2009.  CTA CHEST  Findings:  Mediastinum: Heart size is mildly enlarged, and apex of the heart  is displaced laterally related to the patient's pectus excavatum.  Trace amount of pericardial fluid and/or thickening, unlikely to be  of hemodynamic significance at this time. There is atherosclerosis  of the thoracic aorta, the great vessels of the mediastinum and the  coronary arteries, including calcified atherosclerotic  plaque in  the left main, left anterior descending, left circumflex and right  coronary arteries. Marked thickening and calcification of the  aortic valve, compatible with the given clinical diagnosis of  aortic stenosis. Minimal diameter of the right subclavian artery  occurrs shortly after its origin (7.7 mm). Minimal diameter of the  left subclavian artery is in the mid vessel where it measures  approximately 7 mm in diameter. Ectasia of the ascending thoracic  aorta which measures up to 4.2 x 4.0 cm. The thoracic aortic arch  measures up to 3.0 cm in diameter, and the descending thoracic  aorta measures 2.7 cm in diameter. No pathologically enlarged  mediastinal or hilar lymph nodes. Esophagus is unremarkable in  appearance.  Lungs/Pleura: Previously noted right upper lobe nodule has  increased slightly in size, currently measuring 1.5 x 1.4 cm (image  91 of series 5). This is macrolobulated with subtle surrounding  spiculations, abutting the pleura anteriorly and inferiorly (this  is immediately adjacent to the minor fissure). Areas of mild  dependent atelectasis are noted in the lower lobes of the lungs  bilaterally. No acute consolidative airspace disease. No  significant pleural effusions.  Musculoskeletal: Pectus excavatum. There are no aggressive  appearing lytic or blastic lesions noted in the visualized portions  of the skeleton.  Review of the MIP images confirms the above findings.  IMPRESSION:  1. Interval enlargement of what is now a 1.5 x 1.4 cm  macrolobulated nodule in the inferior aspect of the right upper  lobe abutting the minor fissure. This has subtle spiculated  margins and is concerning for a slow-growing neoplasm such as a low  grade adenocarcinoma (which may account for lack of hypermetabolic  activity on prior PET examination). Consideration for CT guided  biopsy to exclude malignancy is recommended if clinically  indicated.  2. Right and left subclavian  arteries measure approximately 7.7  and 7.0 cm in diameter respectively.  3. Atherosclerosis, including left main and three-vessel coronary  artery disease. In addition, there is mild ectasia of the ascending  thoracic aorta (4.2 x 4.0 cm).  4. Mild cardiomegaly.  5. Pectus excavatum. Because of this, the apex of the left  ventricle was laterally displaced all located between the anterior  aspects of the left sixth and seventh ribs on today's examination.  This may have complications for attempted transapical placement of  TAVR device.  CTA ABDOMEN AND PELVIS  VASCULAR MEASUREMENTS PERTINENT TO TAVR:  AORTA:  Minimal Aortic Diameter - 18 x 18 mm in the infrarenal abdominal  aorta  Severity of Aortic Calcification - mild  RIGHT PELVIS:  Right Common Iliac Artery -  Minimal Diameter - 10.2 x 8.5 mm  Tortuosity - moderate  Calcification - moderate  Right External Iliac Artery -  Minimal Diameter - 9.5 x 11.7 mm  Tortuosity - severe  Calcification - minimal  Right Common Femoral Artery -  Minimal Diameter - 9.2 x 9.5 mm  Tortuosity - mild  Calcification - mild  LEFT PELVIS:  Left Common  Iliac Artery -  Minimal Diameter - 11.0 x 12.5 mm  Tortuosity - mild  Calcification - moderate  Left External Iliac Artery -  Minimal Diameter - 9.3 x 8.9 mm  Tortuosity - severe  Calcification - minimal  Left Common Femoral Artery -  Minimal Diameter - 9.5 x 8.7 mm  Tortuosity - mild  Calcification - mild  Other Findings:  Abdomen/Pelvis: The appearance of the liver, gallbladder,  pancreas, spleen, right adrenal gland and the left kidney is  unremarkable. Mild nodular thickening of the left adrenal gland is  noted, but is unchanged compared to prior examinations and  presumably benign. Several subcentimeter low attenuation lesions  in the right kidney are too small to definitively characterize, but  favored to represent small cysts. The prostate gland is markedly  enlarged measuring  approximately 5.8 x 7.3 cm. Trace volume of  ascites in the pelvis is of uncertain etiology and significance,  but is unusual in a male patient. Numerous colonic diverticula are  noted, particularly in the region of the sigmoid colon, without  surrounding inflammatory changes to suggest acute diverticulitis at  this time. No pneumoperitoneum. No pathologic distension of small  bowel. No definite pathologic lymphadenopathy identified within  the abdomen or pelvis. Postoperative changes of mesh repair for  presumed hernia in the lower left anterior abdominal wall.  Musculoskeletal: There are no aggressive appearing lytic or blastic  lesions noted in the visualized portions of the skeleton.  Review of the MIP images confirms the above findings.  IMPRESSION:  1. Vascular findings and measurements pertinent to potential TAVR  procedure, as discussed above. Although the pelvic arteries are  large enough for pelvic arterial access for the procedure, the  severe tortuosity of the external iliac arteries should be noted as  this may have implications for potential pelvic arterial access.  2. Severely enlarged prostate gland.  3. Mild nodularity of the left adrenal gland is unchanged compared  to the prior study, and therefore favored to be benign.  4. Trace volume of ascites in the anatomic pelvis is unusual in a  male patient, but is of uncertain etiology and significance.  5. Extensive colonic diverticulosis without findings to suggest  acute diverticulitis at this time.  6. Additional incidental findings, as above.  Original Report Authenticated By: Trudie Reed, M.D.     Pulmonary Function Tests  Baseline        FVC  3.35 L  (107% predicted)  FEV1  2.52 L  (118% predicted)  FEF25-75 1.97 L  (152% predicted)       Impression:  The patient has severe symptomatic aortic stenosis with preserved left ventricular systolic function and worsening symptoms of exertional shortness of  breath with frequent dizzy spells. Catheterization is notable for the absence of significant coronary artery disease as well as normal right heart pressures and cardiac output.  The patient would be a candidate for conventional surgical aortic valve replacement with expected predicted risk of mortality and/or morbidity in the moderate risk category because of his advanced age.  As a result, transcatheter aortic valve replacement could be considered an option only if the patient were to be enrolled in an ongoing trial or Registry involving patient's at moderate risk for conventional surgery.  CT angiogram of the chest abdomen and pelvis reveal potentially favorable vascular access for transcatheter aortic valve replacement, although the patient is noted to have somewhat tortuous iliac vessels.  CT angiogram also demonstrates the persistent presence of a small nodule  in the right middle lobe which has increased somewhat in size since 2010 when it was first discovered. At that time the lesion was notably not hypermetabolic on PET imaging, but it clearly has increased in size since then. This lesion potentially could be removed surgically at the time of conventional surgical aortic valve replacement.   Plan:  I spent in excess of 45 minutes discussing options with the patient and his family in the office today. We discussed the possibility of proceeding with aortic valve replacement be a conventional sternotomy with concomitant wedge resection of the right lung nodule. We also discussed proceeding with repeat PET imaging and/or transthoracic needle aspiration biopsy of the right lung lesion if the patient were reluctant to consider proceeding with conventional surgery. Finally we discussed the possibility of referral to a tertiary care center where transcatheter aortic valve replacement might be offered the patient's at moderate risk for conventional surgery.  All of his questions have been addressed. The patient  wants to proceed with the imaging which we have scheduled for later this week.     Salvatore Decent. Cornelius Moras, MD 05/24/2013 10:11 AM

## 2013-05-26 ENCOUNTER — Encounter (HOSPITAL_COMMUNITY)
Admission: RE | Admit: 2013-05-26 | Discharge: 2013-05-26 | Disposition: A | Discharge status: Home / Self Care | Payer: Medicare Other | Source: Ambulatory Visit | Attending: Thoracic Surgery (Cardiothoracic Vascular Surgery) | Admitting: Thoracic Surgery (Cardiothoracic Vascular Surgery)

## 2013-05-26 ENCOUNTER — Other Ambulatory Visit: Payer: Self-pay | Admitting: Thoracic Surgery (Cardiothoracic Vascular Surgery)

## 2013-05-26 ENCOUNTER — Ambulatory Visit: Payer: Medicare Other | Attending: Thoracic Surgery (Cardiothoracic Vascular Surgery) | Admitting: Physical Therapy

## 2013-05-26 ENCOUNTER — Encounter (HOSPITAL_COMMUNITY): Payer: Self-pay

## 2013-05-26 DIAGNOSIS — R5383 Other fatigue: Secondary | ICD-10-CM | POA: Insufficient documentation

## 2013-05-26 DIAGNOSIS — Z01818 Encounter for other preprocedural examination: Secondary | ICD-10-CM | POA: Diagnosis not present

## 2013-05-26 DIAGNOSIS — IMO0001 Reserved for inherently not codable concepts without codable children: Secondary | ICD-10-CM | POA: Insufficient documentation

## 2013-05-26 DIAGNOSIS — R5381 Other malaise: Secondary | ICD-10-CM | POA: Insufficient documentation

## 2013-05-26 DIAGNOSIS — R42 Dizziness and giddiness: Secondary | ICD-10-CM | POA: Insufficient documentation

## 2013-05-26 DIAGNOSIS — D381 Neoplasm of uncertain behavior of trachea, bronchus and lung: Secondary | ICD-10-CM | POA: Insufficient documentation

## 2013-05-26 DIAGNOSIS — R0602 Shortness of breath: Secondary | ICD-10-CM | POA: Diagnosis not present

## 2013-05-26 DIAGNOSIS — R911 Solitary pulmonary nodule: Secondary | ICD-10-CM | POA: Diagnosis not present

## 2013-05-26 MED ORDER — FLUDEOXYGLUCOSE F - 18 (FDG) INJECTION
18.9000 | Freq: Once | INTRAVENOUS | Status: AC | PRN
Start: 2013-05-26 — End: 2013-05-26
  Administered 2013-05-26: 18.9 via INTRAVENOUS

## 2013-05-27 ENCOUNTER — Telehealth: Payer: Self-pay

## 2013-05-27 NOTE — Telephone Encounter (Signed)
Per Dr. Orvan July request, I notified Joseph Garner of the results of his PET scan (performed 05/26/13) - "spot did not light up on PET, probable slow growing benign tumor".  Patient acknowledged understanding and had not further questions.

## 2013-05-28 ENCOUNTER — Other Ambulatory Visit: Payer: Self-pay

## 2013-05-28 DIAGNOSIS — I359 Nonrheumatic aortic valve disorder, unspecified: Secondary | ICD-10-CM

## 2013-05-31 ENCOUNTER — Other Ambulatory Visit: Payer: Self-pay

## 2013-05-31 ENCOUNTER — Encounter (HOSPITAL_COMMUNITY): Payer: Self-pay | Admitting: *Deleted

## 2013-05-31 ENCOUNTER — Telehealth: Payer: Self-pay | Admitting: *Deleted

## 2013-05-31 ENCOUNTER — Inpatient Hospital Stay (HOSPITAL_COMMUNITY)
Admission: EM | Admit: 2013-05-31 | Discharge: 2013-06-15 | DRG: 219 | Disposition: A | Discharge status: Skilled Nursing Facility | Payer: Medicare Other | Attending: Cardiothoracic Surgery | Admitting: Cardiothoracic Surgery

## 2013-05-31 DIAGNOSIS — Z953 Presence of xenogenic heart valve: Secondary | ICD-10-CM

## 2013-05-31 DIAGNOSIS — H9190 Unspecified hearing loss, unspecified ear: Secondary | ICD-10-CM | POA: Diagnosis present

## 2013-05-31 DIAGNOSIS — N400 Enlarged prostate without lower urinary tract symptoms: Secondary | ICD-10-CM | POA: Diagnosis not present

## 2013-05-31 DIAGNOSIS — Z452 Encounter for adjustment and management of vascular access device: Secondary | ICD-10-CM | POA: Diagnosis not present

## 2013-05-31 DIAGNOSIS — J449 Chronic obstructive pulmonary disease, unspecified: Secondary | ICD-10-CM | POA: Diagnosis present

## 2013-05-31 DIAGNOSIS — I440 Atrioventricular block, first degree: Secondary | ICD-10-CM | POA: Diagnosis present

## 2013-05-31 DIAGNOSIS — F329 Major depressive disorder, single episode, unspecified: Secondary | ICD-10-CM | POA: Diagnosis present

## 2013-05-31 DIAGNOSIS — I2510 Atherosclerotic heart disease of native coronary artery without angina pectoris: Secondary | ICD-10-CM | POA: Diagnosis present

## 2013-05-31 DIAGNOSIS — D500 Iron deficiency anemia secondary to blood loss (chronic): Secondary | ICD-10-CM | POA: Diagnosis not present

## 2013-05-31 DIAGNOSIS — I350 Nonrheumatic aortic (valve) stenosis: Secondary | ICD-10-CM | POA: Diagnosis present

## 2013-05-31 DIAGNOSIS — Z01810 Encounter for preprocedural cardiovascular examination: Secondary | ICD-10-CM | POA: Diagnosis not present

## 2013-05-31 DIAGNOSIS — M129 Arthropathy, unspecified: Secondary | ICD-10-CM | POA: Diagnosis present

## 2013-05-31 DIAGNOSIS — R5381 Other malaise: Secondary | ICD-10-CM | POA: Diagnosis not present

## 2013-05-31 DIAGNOSIS — Z87891 Personal history of nicotine dependence: Secondary | ICD-10-CM

## 2013-05-31 DIAGNOSIS — Z4682 Encounter for fitting and adjustment of non-vascular catheter: Secondary | ICD-10-CM | POA: Diagnosis not present

## 2013-05-31 DIAGNOSIS — I509 Heart failure, unspecified: Secondary | ICD-10-CM | POA: Diagnosis not present

## 2013-05-31 DIAGNOSIS — R0602 Shortness of breath: Secondary | ICD-10-CM | POA: Diagnosis not present

## 2013-05-31 DIAGNOSIS — J984 Other disorders of lung: Secondary | ICD-10-CM | POA: Diagnosis not present

## 2013-05-31 DIAGNOSIS — K5900 Constipation, unspecified: Secondary | ICD-10-CM | POA: Diagnosis not present

## 2013-05-31 DIAGNOSIS — I5023 Acute on chronic systolic (congestive) heart failure: Secondary | ICD-10-CM | POA: Diagnosis not present

## 2013-05-31 DIAGNOSIS — I5022 Chronic systolic (congestive) heart failure: Secondary | ICD-10-CM | POA: Diagnosis not present

## 2013-05-31 DIAGNOSIS — I4891 Unspecified atrial fibrillation: Secondary | ICD-10-CM | POA: Diagnosis not present

## 2013-05-31 DIAGNOSIS — J9819 Other pulmonary collapse: Secondary | ICD-10-CM | POA: Diagnosis not present

## 2013-05-31 DIAGNOSIS — I471 Supraventricular tachycardia: Secondary | ICD-10-CM | POA: Diagnosis present

## 2013-05-31 DIAGNOSIS — Q859 Phakomatosis, unspecified: Secondary | ICD-10-CM | POA: Diagnosis not present

## 2013-05-31 DIAGNOSIS — K219 Gastro-esophageal reflux disease without esophagitis: Secondary | ICD-10-CM | POA: Diagnosis present

## 2013-05-31 DIAGNOSIS — Z951 Presence of aortocoronary bypass graft: Secondary | ICD-10-CM

## 2013-05-31 DIAGNOSIS — R41841 Cognitive communication deficit: Secondary | ICD-10-CM | POA: Diagnosis not present

## 2013-05-31 DIAGNOSIS — I480 Paroxysmal atrial fibrillation: Secondary | ICD-10-CM

## 2013-05-31 DIAGNOSIS — E876 Hypokalemia: Secondary | ICD-10-CM | POA: Diagnosis not present

## 2013-05-31 DIAGNOSIS — Y838 Other surgical procedures as the cause of abnormal reaction of the patient, or of later complication, without mention of misadventure at the time of the procedure: Secondary | ICD-10-CM | POA: Diagnosis not present

## 2013-05-31 DIAGNOSIS — J4489 Other specified chronic obstructive pulmonary disease: Secondary | ICD-10-CM | POA: Diagnosis present

## 2013-05-31 DIAGNOSIS — E871 Hypo-osmolality and hyponatremia: Secondary | ICD-10-CM | POA: Diagnosis not present

## 2013-05-31 DIAGNOSIS — I4892 Unspecified atrial flutter: Secondary | ICD-10-CM

## 2013-05-31 DIAGNOSIS — I25810 Atherosclerosis of coronary artery bypass graft(s) without angina pectoris: Secondary | ICD-10-CM | POA: Diagnosis not present

## 2013-05-31 DIAGNOSIS — H3530 Unspecified macular degeneration: Secondary | ICD-10-CM | POA: Diagnosis present

## 2013-05-31 DIAGNOSIS — E785 Hyperlipidemia, unspecified: Secondary | ICD-10-CM

## 2013-05-31 DIAGNOSIS — I5031 Acute diastolic (congestive) heart failure: Secondary | ICD-10-CM | POA: Diagnosis not present

## 2013-05-31 DIAGNOSIS — M6281 Muscle weakness (generalized): Secondary | ICD-10-CM | POA: Diagnosis not present

## 2013-05-31 DIAGNOSIS — I10 Essential (primary) hypertension: Secondary | ICD-10-CM | POA: Diagnosis present

## 2013-05-31 DIAGNOSIS — Q230 Congenital stenosis of aortic valve: Secondary | ICD-10-CM | POA: Diagnosis not present

## 2013-05-31 DIAGNOSIS — D62 Acute posthemorrhagic anemia: Secondary | ICD-10-CM | POA: Diagnosis not present

## 2013-05-31 DIAGNOSIS — I519 Heart disease, unspecified: Secondary | ICD-10-CM | POA: Diagnosis not present

## 2013-05-31 DIAGNOSIS — R279 Unspecified lack of coordination: Secondary | ICD-10-CM | POA: Diagnosis not present

## 2013-05-31 DIAGNOSIS — Z952 Presence of prosthetic heart valve: Secondary | ICD-10-CM

## 2013-05-31 DIAGNOSIS — I498 Other specified cardiac arrhythmias: Secondary | ICD-10-CM | POA: Diagnosis not present

## 2013-05-31 DIAGNOSIS — D696 Thrombocytopenia, unspecified: Secondary | ICD-10-CM | POA: Diagnosis not present

## 2013-05-31 DIAGNOSIS — I359 Nonrheumatic aortic valve disorder, unspecified: Principal | ICD-10-CM | POA: Diagnosis present

## 2013-05-31 DIAGNOSIS — R918 Other nonspecific abnormal finding of lung field: Secondary | ICD-10-CM | POA: Diagnosis not present

## 2013-05-31 DIAGNOSIS — J438 Other emphysema: Secondary | ICD-10-CM | POA: Diagnosis not present

## 2013-05-31 DIAGNOSIS — R262 Difficulty in walking, not elsewhere classified: Secondary | ICD-10-CM | POA: Diagnosis not present

## 2013-05-31 DIAGNOSIS — N179 Acute kidney failure, unspecified: Secondary | ICD-10-CM | POA: Diagnosis not present

## 2013-05-31 DIAGNOSIS — D386 Neoplasm of uncertain behavior of respiratory organ, unspecified: Secondary | ICD-10-CM | POA: Diagnosis not present

## 2013-05-31 DIAGNOSIS — I77819 Aortic ectasia, unspecified site: Secondary | ICD-10-CM | POA: Diagnosis present

## 2013-05-31 DIAGNOSIS — F3289 Other specified depressive episodes: Secondary | ICD-10-CM | POA: Diagnosis present

## 2013-05-31 HISTORY — DX: Shortness of breath: R06.02

## 2013-05-31 HISTORY — DX: Unspecified atrial flutter: I48.92

## 2013-05-31 HISTORY — DX: Heart failure, unspecified: I50.9

## 2013-05-31 LAB — BASIC METABOLIC PANEL
BUN: 12 mg/dL (ref 6–23)
CO2: 25 mEq/L (ref 19–32)
Calcium: 8.8 mg/dL (ref 8.4–10.5)
Creatinine, Ser: 1.03 mg/dL (ref 0.50–1.35)
GFR calc non Af Amer: 63 mL/min — ABNORMAL LOW (ref >90)
Glucose, Bld: 95 mg/dL (ref 70–99)

## 2013-05-31 LAB — URINALYSIS, ROUTINE W REFLEX MICROSCOPIC
Bilirubin Urine: NEGATIVE
Glucose, UA: NEGATIVE mg/dL
Hgb urine dipstick: NEGATIVE
Protein, ur: NEGATIVE mg/dL
Urobilinogen, UA: 1 mg/dL (ref 0.0–1.0)

## 2013-05-31 LAB — APTT: aPTT: 32 seconds (ref 24–37)

## 2013-05-31 LAB — COMPREHENSIVE METABOLIC PANEL
AST: 18 U/L (ref 0–37)
Albumin: 3.8 g/dL (ref 3.5–5.2)
BUN: 12 mg/dL (ref 6–23)
Chloride: 99 mEq/L (ref 96–112)
Creatinine, Ser: 1.05 mg/dL (ref 0.50–1.35)
Potassium: 4.5 mEq/L (ref 3.5–5.1)
Total Protein: 6.8 g/dL (ref 6.0–8.3)

## 2013-05-31 LAB — CBC
HCT: 36.7 % — ABNORMAL LOW (ref 39.0–52.0)
Hemoglobin: 13 g/dL (ref 13.0–17.0)
MCH: 33.5 pg (ref 26.0–34.0)
MCHC: 35.4 g/dL (ref 30.0–36.0)
MCV: 94.6 fL (ref 78.0–100.0)
Platelets: 199 10*3/uL (ref 150–400)
RBC: 3.85 MIL/uL — ABNORMAL LOW (ref 4.22–5.81)
RDW: 13.5 % (ref 11.5–15.5)
RDW: 13.4 % (ref 11.5–15.5)
WBC: 7.2 10*3/uL (ref 4.0–10.5)

## 2013-05-31 LAB — POCT I-STAT TROPONIN I: Troponin i, poc: 0 ng/mL (ref 0.00–0.08)

## 2013-05-31 LAB — PROTIME-INR: Prothrombin Time: 13.7 seconds (ref 11.6–15.2)

## 2013-05-31 MED ORDER — ONDANSETRON HCL 4 MG/2ML IJ SOLN: 4.0000 mg | Freq: Four times a day (QID) | INTRAMUSCULAR | Status: DC | PRN

## 2013-05-31 MED ORDER — OMEPRAZOLE MAGNESIUM 20 MG PO TBEC: 20.0000 mg | DELAYED_RELEASE_TABLET | Freq: Every day | ORAL | Status: DC

## 2013-05-31 MED ORDER — SODIUM CHLORIDE 0.9 % IJ SOLN
3.0000 mL | Freq: Two times a day (BID) | INTRAMUSCULAR | Status: DC
  Administered 2013-05-31 – 2013-06-03 (×7): 3 mL via INTRAVENOUS

## 2013-05-31 MED ORDER — SODIUM CHLORIDE 0.9 % IJ SOLN: 3.0000 mL | INTRAMUSCULAR | Status: DC | PRN

## 2013-05-31 MED ORDER — DOXAZOSIN MESYLATE 4 MG PO TABS
4.0000 mg | ORAL_TABLET | Freq: Every day | ORAL | Status: DC
  Administered 2013-05-31 – 2013-06-03 (×4): 4 mg via ORAL
  Filled 2013-05-31 (×5): qty 1

## 2013-05-31 MED ORDER — ASPIRIN 81 MG PO CHEW
81.0000 mg | CHEWABLE_TABLET | Freq: Every day | ORAL | Status: DC
  Administered 2013-06-01 – 2013-06-03 (×3): 81 mg via ORAL
  Filled 2013-05-31 (×6): qty 1

## 2013-05-31 MED ORDER — HEPARIN SODIUM (PORCINE) 5000 UNIT/ML IJ SOLN
5000.0000 [IU] | Freq: Three times a day (TID) | INTRAMUSCULAR | Status: AC
  Administered 2013-05-31 – 2013-06-03 (×10): 5000 [IU] via SUBCUTANEOUS
  Filled 2013-05-31 (×11): qty 1

## 2013-05-31 MED ORDER — SODIUM CHLORIDE 0.9 % IV SOLN: 250.0000 mL | INTRAVENOUS | Status: DC | PRN

## 2013-05-31 MED ORDER — PANTOPRAZOLE SODIUM 40 MG PO TBEC
40.0000 mg | DELAYED_RELEASE_TABLET | Freq: Every day | ORAL | Status: DC
  Administered 2013-06-01 – 2013-06-03 (×3): 40 mg via ORAL
  Filled 2013-05-31 (×3): qty 1

## 2013-05-31 MED ORDER — ACETAMINOPHEN 325 MG PO TABS: 650.0000 mg | ORAL_TABLET | ORAL | Status: DC | PRN

## 2013-05-31 MED ORDER — FUROSEMIDE 10 MG/ML IJ SOLN: 20.0000 mg | Freq: Once | INTRAMUSCULAR | Status: DC

## 2013-05-31 MED ORDER — DARIFENACIN HYDROBROMIDE ER 7.5 MG PO TB24
7.5000 mg | ORAL_TABLET | Freq: Every day | ORAL | Status: DC
  Administered 2013-06-01 – 2013-06-03 (×3): 7.5 mg via ORAL
  Filled 2013-05-31 (×4): qty 1

## 2013-05-31 NOTE — Progress Notes (Signed)
  Subjective: Patient examined, cardiac cath-angiograms and echocardiogram reports reviewed, CT scan and PET scan reviewed. 79 year old male with moderate COPD and severe aortic stenosis scheduled for aVR on August 13 by Dr. on presented to the emergency department with worsening symptoms of heart failure. These mainly consist of weakness, dyspnea exertion and malaise which is improved with rest. He denies PND or orthopnea or significant weight gain. He is in a sinus rhythm he is not anemic and has had no recent infection symptoms A PET scan was performed 5 days ago for a lobular mass in the right middle lobe which had minimal metabolic activity consistent with hamartoma  Objective: Vital signs in last 24 hours: Temp:  [97.5 F (36.4 C)-97.8 F (36.6 C)] 97.8 F (36.6 C) (08/04 1829) Pulse Rate:  [57-70] 65 (08/04 1829) Cardiac Rhythm:  [-]  Resp:  [10-18] 16 (08/04 1829) BP: (116-140)/(60-83) 140/72 mmHg (08/04 1829) SpO2:  [97 %-100 %] 98 % (08/04 1829) Weight:  [155 lb 6.4 oz (70.489 kg)-162 lb (73.483 kg)] 155 lb 6.4 oz (70.489 kg) (08/04 1829)  Hemodynamic parameters for last 24 hours:    Intake/Output from previous day:   Intake/Output this shift:    Exam Currently comfortable lying flat Grade 3/6 systolic ejection murmur Good peripheral perfusion  Lab Results:  Recent Labs  05/31/13 1424 05/31/13 1827  WBC 6.8 7.2  HGB 13.0 13.2  HCT 36.7* 36.2*  PLT 186 199   BMET:  Recent Labs  05/31/13 1424 05/31/13 1827  NA 132* 134*  K 4.0 4.5  CL 98 99  CO2 25 27  GLUCOSE 95 96  BUN 12 12  CREATININE 1.03 1.05  CALCIUM 8.8 9.0    PT/INR:  Recent Labs  05/31/13 1827  LABPROT 13.7  INR 1.07   ABG    Component Value Date/Time   PHART 7.364 05/12/2013 1125   HCO3 25.5* 05/12/2013 1134   TCO2 27 05/12/2013 1134   ACIDBASEDEF 1.0 05/12/2013 1125   O2SAT 70.0 05/12/2013 1134   CBG (last 3)  No results found for this basename: GLUCAP,  in the last 72  hours  Assessment/Plan: S/P   We'll review PA and lateral chest x-ray in a.m. for evidence of interstitial edema He probably should not wait 9-10 days until surgery. We'll review his situation tomorrow. with Dr. Leverne Humbles, M.D.    [phone]   LOS: 0 days    VAN Dinah Beers 05/31/2013

## 2013-05-31 NOTE — ED Notes (Signed)
Pt refuses Xray, states he has recently had a CXR, CT chest and PET scan and does not feel another CXR is necessary, md notified

## 2013-05-31 NOTE — Telephone Encounter (Signed)
Patient called in this morning saying over the weekend he has gotten more short of breath and weaker.  Denies chest pain.  He is scheduled for an AVR by Dr. Cornelius Moras on 8/13 but he said he probably cannot wait that long.  I told him that he needs to go to [facility] emergency room if he feels that he is getting worse each day.  Told patient to call me if any other concerns came up.

## 2013-05-31 NOTE — ED Provider Notes (Addendum)
I have supervised the resident on the management of this patient and agree with the note above. I personally interviewed and examined the patient and my addendum is below.   Joseph Garner is a 79 y.o. male here with SOB. He has severe aortic stenosis requiring surgery scheduled next week. SOB with minimal exertion over the weekend. Exam showed 3/6 systolic murmur with radiation to carotid, some bibasilar crackles, no JVD or peripheral swelling. BNP 3000. I called Cardiothoracic and Dr. Mayford Knife from cardiology, who will admit for further workup for CHF exacerbation from severe AS.     Richardean Canal, MD 05/31/13 2009  Richardean Canal, MD 05/31/13 2010

## 2013-05-31 NOTE — ED Notes (Signed)
Pt with sob and called pcp who said to come to ED.  Scheduled for heart valve replacement on 8/13.  States he has been experiencing sob for several weeks, but it is increasing.  SOB eases when laying in supine position.  No LE edema or chest pain.

## 2013-05-31 NOTE — Progress Notes (Signed)
Pt up in bed.  A&0 x3.  Introduced to Development worker, international aid.  Verbalized understanding.  Call bell at bedside. Instructed to call for assist before getting out of bed.  Verbalized understanding.  Bed alarm set to go off if pt stand up with assist.  Amanda Pea, RN.

## 2013-05-31 NOTE — H&P (Signed)
Admit date: 05/31/2013 Referring Physician Dr. Thurnell Lose Primary Cardiologist Dr. Anne Fu Chief complaint/reason for admission:SOB  HPI: This is an 79yo WM with a history of HTN GERD and severe AS.  An echo in 2008 showed only mild AS.  The patient started complaining of vertigo and increased ODE along with mild chest pressure and was found to have severe AS by echo.  On July 16th he underwent cardiac cath showing moderate CAD of the LAD, 80% diagonal, 80% OM and normal right heart pressures.  He is scheduled to have AVR by Dr. Cornelius Moras next week.  He says that over the past week he has had worsening SOB.  He denies any LE edema, palpitations or chest pain.  He has had some lightheadedness as well.  Today his breathing got worse.  He denies any PND or orthopnea.      PMH:    Past Medical History  Diagnosis Date  . BPH (benign prostatic hypertrophy)   . Diverticulosis 2006  . Depression   . Anemia   . Spinal stenosis   . Lung nodules 2010  . Actinic keratosis, hx of   . Macular degeneration   . Hypertension   . Allergy     allergic rhinitis  . Hearing loss     wears bilateral hearing aids  . Inguinal hernia     left side -surgery planned  . GERD (gastroesophageal reflux disease)   . Arthritis     generalized-more back issues  . Severe aortic stenosis 04/16/2013  . Nodule of right lung 05/24/2013    PSH:    Past Surgical History  Procedure Laterality Date  . Tonsillectomy  1933  . Appendectomy  1947  . Neck surgery  1997    compressed nerve  . Skin biopsy  2003-2011    multiple   . Cataract extraction, bilateral      bilateral  . Inguinal hernia repair Left 02/11/2013    Procedure: LEFT LAPAROSCOPIC REPAIR INGUINAL HERNIA WITH MESH;  Surgeon: Ernestene Mention, MD;  Location: [facility];  Service: General;  Laterality: Left;  . Insertion of mesh Left 02/11/2013    Procedure: INSERTION OF MESH;  Surgeon: Ernestene Mention, MD;  Location: [facility];  Service: General;  Laterality: Left;  . Hernia  repair  02/24/13    lap LIH repair    ALLERGIES:   Review of patient's allergies indicates no known allergies.  Prior to Admit Meds:   (Not in a hospital admission) Family HX:    Family History  Problem Relation Age of Onset  . Prostate cancer Father   . Cancer Mother     passed away age 25   Social HX:    History   Social History  . Marital Status: Married    Spouse Name: N/A    Number of Children: 1  . Years of Education: N/A   Occupational History  . retired from [employer]    Social History Main Topics  . Smoking status: Former Smoker -- 1.50 packs/day for 20 years    Types: Cigarettes    Quit date: 10/29/1967  . Smokeless tobacco: Never Used  . Alcohol Use: Yes     Comment: occasionally  . Drug Use: No  . Sexually Active: Not Currently   Other Topics Concern  . Not on file   Social History Narrative  . No narrative on file     ROS:  All 11 ROS were addressed and are negative except what is stated in  the HPI  PHYSICAL EXAM Filed Vitals:   05/31/13 1327  BP: 116/83  Pulse: 62  Temp:   Resp: 14   General: Well developed, well nourished, in no acute distress Head: Eyes PERRLA, No xanthomas.   Normal cephalic and atramatic  Lungs:   Clear bilaterally to auscultation and percussion. Heart:   HRRR S1 S2 Pulses are 2+ & equal.loud harsh 2/6 late peaking systolic murmur at RUSB to apex with loss of S2 component            Bilateral carotid bruits radiating from RUSB. No JVD.  No abdominal bruits. No femoral bruits. Abdomen: Bowel sounds are positive, abdomen soft and non-tender without masses Extremities:   No clubbing, cyanosis or edema.  DP +1 Neuro: Alert and oriented X 3. Psych:  Good affect, responds appropriately   Labs:   Lab Results  Component Value Date   WBC 6.8 05/31/2013   HGB 13.0 05/31/2013   HCT 36.7* 05/31/2013   MCV 94.6 05/31/2013   PLT 186 05/31/2013    Recent Labs Lab 05/31/13 1424  NA 132*  K 4.0  CL 98  CO2 25  BUN 12  CREATININE  1.03  CALCIUM 8.8  GLUCOSE 95   Lab Results  Component Value Date   CKTOTAL 61 01/19/2009   CKMB 1.3 01/19/2009   TROPONINI  Value: <0.01        NO INDICATION OF MYOCARDIAL INJURY. 01/19/2009   No results found for this basename: PTT   Lab Results  Component Value Date   INR 1.1 01/19/2009     Lab Results  Component Value Date   CHOL  Value: 148        ATP III CLASSIFICATION:  <200     mg/dL   Desirable  161-096  mg/dL   Borderline High  >=045    mg/dL   High        02/03/8118   Lab Results  Component Value Date   HDL 44 01/19/2009   Lab Results  Component Value Date   LDLCALC  Value: 96        Total Cholesterol/HDL:CHD Risk Coronary Heart Disease Risk Table                     Men   Women  1/2 Average Risk   3.4   3.3  Average Risk       5.0   4.4  2 X Average Risk   9.6   7.1  3 X Average Risk  23.4   11.0        Use the calculated Patient Ratio above and the CHD Risk Table to determine the patient's CHD Risk.        ATP III CLASSIFICATION (LDL):  <100     mg/dL   Optimal  147-829  mg/dL   Near or Above                    Optimal  130-159  mg/dL   Borderline  562-130  mg/dL   High  >865     mg/dL   Very High 7/84/6962   Lab Results  Component Value Date   TRIG 39 01/19/2009   Lab Results  Component Value Date   CHOLHDL 3.4 01/19/2009   No results found for this basename: LDLDIRECT      Radiology:  *RADIOLOGY REPORT*  Clinical Data: Preoperative examination (hernia surgery)  CHEST - 2 VIEW  Comparison: 01/18/2009; chest  CT - 01/18/2009; PET CT - 07/14/2009  Findings:  Grossly unchanged enlarged cardiac silhouette and mediastinal  contours with atherosclerotic calcifications within a mildly  ectatic thoracic aorta. The lungs remain hyperexpanded with  flattening of bilateral hemidiaphragms. There is grossly unchanged  mild diffuse thickening of the pulmonary interstitium. Known  nodule within the right middle lobe is not seen on the present  examination. No pleural effusion  or pneumothorax. Unchanged bones.  IMPRESSION:  1. Chronic lung hyperexpansion and bronchitic change without acute  cardiopulmonary disease.  2. Known nodule within the right middle lobe is not seen though  resolution should not be assumed on the basis of this examination.    EKG:  NSR with nonspecific IVCD  ASSESSMENT:  1.  Worsening DOE secondary to acute diastolic CHF and severe AS 2.  Severe AS - for AVR next week 3.  CAD of branch vessels (diagonal and OM) 4.  HTN  PLAN:   1.  Admit to tele bed 2.  Continue home meds 3.  Gentle diuresis with IV Lasix given severe AS 4.  SQ Heparin for DVT prophylaxis 5.  Further workup per Dr. Anne Fu - may need AVR sooner if possible once CHF resolves  Quintella Reichert, MD  05/31/2013  4:54 PM

## 2013-05-31 NOTE — ED Provider Notes (Signed)
CSN: [id number]     Arrival date & time 05/31/13  1236 History     First MD Initiated Contact with Patient 05/31/13 1310     Chief Complaint  Patient presents with  . Shortness of Breath   (Consider location/radiation/quality/duration/timing/severity/associated sxs/prior Treatment) Patient is a 79 y.o. male presenting with shortness of breath.  Shortness of Breath Associated symptoms: chest pain   Associated symptoms: no abdominal pain, no cough, no diaphoresis, no fever, no headaches, no neck pain, no vomiting and no wheezing    Joseph Garner is a 79 year old male with PMH of Severe Symptomatic aortic stenosis, followed by Dr. Barry Dienes and scheduled for replacement on 06/09/13, Moderate nonobstructive CAD seen on cardiac cath 7/16 and medically managed.  He presents to the ED today for symptoms of increasing SOB, dizziness, light headed and a diffuse vague dull aching chest pain.  He reports that he has been more SOB since his exercise therapy session last week, where he reports he felt dizzy and light headed.  Since that time he has continued to be SOB with exertional activity, however this morning he reports it is acutely worse in that he has the symptoms from doing any activity at all.  Past Medical History  Diagnosis Date  . BPH (benign prostatic hypertrophy)   . Diverticulosis 2006  . Depression   . Anemia   . Spinal stenosis   . Lung nodules 2010  . Actinic keratosis, hx of   . Macular degeneration   . Hypertension   . Allergy     allergic rhinitis  . Hearing loss     wears bilateral hearing aids  . Inguinal hernia     left side -surgery planned  . GERD (gastroesophageal reflux disease)   . Arthritis     generalized-more back issues  . Severe aortic stenosis 04/16/2013  . Nodule of right lung 05/24/2013   Past Surgical History  Procedure Laterality Date  . Tonsillectomy  1933  . Appendectomy  1947  . Neck surgery  1997    compressed nerve  . Skin biopsy  2003-2011   multiple   . Cataract extraction, bilateral      bilateral  . Inguinal hernia repair Left 02/11/2013    Procedure: LEFT LAPAROSCOPIC REPAIR INGUINAL HERNIA WITH MESH;  Surgeon: Ernestene Mention, MD;  Location: [facility];  Service: General;  Laterality: Left;  . Insertion of mesh Left 02/11/2013    Procedure: INSERTION OF MESH;  Surgeon: Ernestene Mention, MD;  Location: [facility];  Service: General;  Laterality: Left;  . Hernia repair  02/24/13    lap LIH repair   Family History  Problem Relation Age of Onset  . Prostate cancer Father   . Cancer Mother     passed away age 50   History  Substance Use Topics  . Smoking status: Former Smoker -- 1.50 packs/day for 20 years    Types: Cigarettes    Quit date: 10/29/1967  . Smokeless tobacco: Never Used  . Alcohol Use: Yes     Comment: occasionally    Review of Systems  Constitutional: Positive for activity change. Negative for fever, diaphoresis and fatigue.  HENT: Positive for hearing loss (hearing aids (x4 years)). Negative for neck pain.   Eyes: Negative for visual disturbance.  Respiratory: Positive for shortness of breath. Negative for cough, choking, chest tightness, wheezing and stridor.   Cardiovascular: Positive for chest pain and leg swelling (reports typically worse througout day, better in  morning.). Negative for palpitations.  Gastrointestinal: Negative for nausea, vomiting, abdominal pain, diarrhea and constipation.  Genitourinary: Positive for dysuria and difficulty urinating. Negative for hematuria, decreased urine volume and discharge.  Musculoskeletal: Negative for myalgias and arthralgias.  Neurological: Positive for dizziness and light-headedness. Negative for syncope, weakness and headaches.    Allergies  Review of patient's allergies indicates no known allergies.  Home Medications   Current Outpatient Rx  Name  Route  Sig  Dispense  Refill  . aspirin 81 MG tablet   Oral   Take 81 mg by mouth daily.         Marland Kitchen  doxazosin (CARDURA) 8 MG tablet   Oral   Take 8 mg by mouth at bedtime.         . fluticasone (FLONASE) 50 MCG/ACT nasal spray   Nasal   Place 2 sprays into the nose daily as needed for allergies.          Marland Kitchen loratadine (CLARITIN) 10 MG tablet   Oral   Take 10 mg by mouth daily as needed for allergies.          . meloxicam (MOBIC) 15 MG tablet   Oral   Take 15 mg by mouth as needed for pain.         . Multiple Vitamin (MULTIVITAMIN WITH MINERALS) TABS   Oral   Take 1 tablet by mouth daily.         . Multiple Vitamins-Minerals (PRESERVISION AREDS PO)   Oral   Take 2 capsules by mouth daily.          Marland Kitchen omeprazole (PRILOSEC OTC) 20 MG tablet   Oral   Take 20 mg by mouth daily.         . solifenacin (VESICARE) 5 MG tablet   Oral   Take 5 mg by mouth daily.          BP 116/83  Pulse 62  Temp(Src) 97.5 F (36.4 C) (Oral)  Resp 14  Ht 5\' 8"  (1.727 m)  Wt 162 lb (73.483 kg)  BMI 24.64 kg/m2  SpO2 98% Physical Exam  Constitutional: He is oriented to person, place, and time. He appears well-developed and well-nourished. No distress.  HENT:  Head: Normocephalic and atraumatic.  Eyes: EOM are normal. Pupils are equal, round, and reactive to light. Right eye exhibits no discharge. Left eye exhibits no discharge. No scleral icterus.  Cardiovascular: Normal rate and regular rhythm.  Exam reveals no gallop and no friction rub.   Murmur (SEM) heard. Pulmonary/Chest: Effort normal and breath sounds normal. No respiratory distress. He has no wheezes. He has no rales. He exhibits no tenderness.  Abdominal: Soft. Bowel sounds are normal. There is no tenderness. There is no rebound and no guarding.  Musculoskeletal: He exhibits no edema.  Neurological: He is alert and oriented to person, place, and time. No cranial nerve deficit.  Skin: Skin is warm and dry. No rash noted. He is not diaphoretic. No erythema.    ED Course   Procedures (including critical care time)   Date: 05/31/2013  Rate: 83  Rhythm: normal sinus rhythm  QRS Axis: normal  Intervals: PR prolonged  ST/T Wave abnormalities: normal  Conduction Disutrbances:first-degree A-V block   Narrative Interpretation:   Old EKG Reviewed: unchanged 02/03/13   Labs Reviewed  CBC  PRO B NATRIURETIC PEPTIDE  BASIC METABOLIC PANEL   No results found. No diagnosis found.  MDM  Patient presents with symptoms most likely related to  his Severe AS.  However he does have significant CAD and is at risk for ACS, will need to r/o.  Patient does have a history of stable lung nodule, is being worked up.  Initial troponin neg ProBNP elevated at 3113 BMet shows hyponatremia  -Cardiology (Dr. Mayford Knife) consulted for admission Did note some dysuria on ROS will check U/A. ->negative   Joseph Purl, DO 05/31/13 1756

## 2013-06-01 ENCOUNTER — Other Ambulatory Visit: Payer: Self-pay | Admitting: *Deleted

## 2013-06-01 ENCOUNTER — Encounter (HOSPITAL_COMMUNITY): Payer: Self-pay | Admitting: General Practice

## 2013-06-01 ENCOUNTER — Observation Stay (HOSPITAL_COMMUNITY): Payer: Medicare Other

## 2013-06-01 DIAGNOSIS — I359 Nonrheumatic aortic valve disorder, unspecified: Secondary | ICD-10-CM

## 2013-06-01 DIAGNOSIS — J438 Other emphysema: Secondary | ICD-10-CM | POA: Diagnosis not present

## 2013-06-01 DIAGNOSIS — Z01810 Encounter for preprocedural cardiovascular examination: Secondary | ICD-10-CM

## 2013-06-01 DIAGNOSIS — I471 Supraventricular tachycardia: Secondary | ICD-10-CM | POA: Diagnosis present

## 2013-06-01 LAB — BASIC METABOLIC PANEL
BUN: 15 mg/dL (ref 6–23)
Calcium: 8.5 mg/dL (ref 8.4–10.5)
GFR calc Af Amer: 68 mL/min — ABNORMAL LOW (ref >90)
GFR calc non Af Amer: 58 mL/min — ABNORMAL LOW (ref >90)
Glucose, Bld: 86 mg/dL (ref 70–99)
Potassium: 3.8 mEq/L (ref 3.5–5.1)
Sodium: 133 mEq/L — ABNORMAL LOW (ref 135–145)

## 2013-06-01 LAB — TSH: TSH: 1.697 u[IU]/mL (ref 0.350–4.500)

## 2013-06-01 LAB — MRSA PCR SCREENING: MRSA by PCR: NEGATIVE

## 2013-06-01 MED ORDER — METOPROLOL TARTRATE 12.5 MG HALF TABLET
12.5000 mg | ORAL_TABLET | Freq: Two times a day (BID) | ORAL | Status: DC
  Administered 2013-06-01 – 2013-06-03 (×5): 12.5 mg via ORAL
  Filled 2013-06-01 (×10): qty 1

## 2013-06-01 NOTE — Evaluation (Signed)
Physical Therapy One Time Evaluation/Discharge Patient Details Name: Joseph Garner MRN: [id number] DOB: [birth year] Today's Date: 06/01/2013 Time: 4098-1191 PT Time Calculation (min): 35 min  PT Assessment / Plan / Recommendation History of Present Illness  79 y.o. male admitted to [facility] 05/31/13 with worsening SOB and DOE.  He was scheduled to have an aortic valave replacement next week.  Cardiac surgery is looking to move the surgery forward as the pt's symptoms have become more emergent and severe.    Clinical Impression  The pt is needing his AVR surgery moved up.  Last MD note stated that he did not feel comfortable discharging pt home before he had this surgery.  Based on events from earlier today (see RN and MD notes) PT did not want to stress the pt's system too much and make him have another episode like earlier today. Pt is able to move about his room independently and was independent with limited endurance PTA due to his current cardiac issues.  Once he has had his AVR surgery please re-order PT.  Until then, we will sign off.      PT Assessment  Patent does not need any further PT services    Follow Up Recommendations  No PT follow up;Other (comment) (no PT f/u now, please re-order PT post-op)    Does the patient have the potential to tolerate intense rehabilitation     Not currently, but after surgery, likely yes.    Barriers to Discharge   None      Equipment Recommendations  None recommended by PT          Precautions / Restrictions Precautions Precautions: Fall Precaution Comments: Monitor HR, DOE, and O2 sats with mobility   Pertinent Vitals/Pain See vitals flow sheet.       Mobility  Bed Mobility Bed Mobility: Not assessed (pt seated EOB) Transfers Transfers: Sit to Stand;Stand to Sit Sit to Stand: 6: Modified independent (Device/Increase time);With upper extremity assist;From toilet Stand to Sit: 6: Modified independent (Device/Increase time);With upper  extremity assist;To bed Details for Transfer Assistance: used grab bar to get off of low commode, and used arms to lower to sitting on bed Ambulation/Gait Ambulation/Gait Assistance: 5: Supervision Ambulation Distance (Feet): 15 Feet Assistive device: None Ambulation/Gait Assistance Details: pt only walked from bathroom to bed.  Due to events from earlier this AM (see RN and MD notes) I did not stress walking into the hallway.   Gait Pattern: Within Functional Limits    Exercises Other Exercises Other Exercises: Extensive education completed re: therapy practices post-op AVR surgery and therapy f/u (SNF, HH, CIR) options post-op.  All questions completed.        PT Goals(Current goals can be found in the care plan section) Acute Rehab PT Goals Patient Stated Goal: to get his valve fixed so he can get back to active, independent lifestyle  Visit Information  Last PT Received On: 06/01/13 Assistance Needed: +1 History of Present Illness: 79 y.o. male admitted to [facility] 05/31/13 with worsening SOB and DOE.  He was scheduled to have an aortic valave replacement next week.  Cardiac surgery is looking to move the surgery forward as the pt's symptoms have become more emergent and severe.         Prior Functioning  Home Living Family/patient expects to be discharged to:: Private residence Living Arrangements: Spouse/significant other Available Help at Discharge: Family;Available 24 hours/day Type of Home: House Home Access: Stairs to enter [employer] of Steps: 6 Entrance Stairs-Rails:  Left (brick wall on right that he can use for support) Home Layout: One level Home Equipment: None;Other (comment) (reports that he might have a walker, but not sure) Prior Function Level of Independence: Independent Comments: was independent and walking any distance until his hernia surgery in April of 2014 since then he has limited ambulation which got significantly worse over the past week.    Communication Communication: HOH    Cognition  Cognition Arousal/Alertness: Awake/alert Behavior During Therapy: WFL for tasks assessed/performed Overall Cognitive Status: Within Functional Limits for tasks assessed    Extremity/Trunk Assessment Upper Extremity Assessment Upper Extremity Assessment: Overall WFL for tasks assessed Lower Extremity Assessment Lower Extremity Assessment: Overall WFL for tasks assessed Cervical / Trunk Assessment Cervical / Trunk Assessment: Normal      End of Session PT - End of Session Activity Tolerance: Patient limited by fatigue Patient left: in bed;with call bell/phone within reach;with family/visitor present (left seated EOB) Nurse Communication: Other (comment) (will not follow until re-ordered after surgery)    Lurena Joiner B. Kalliopi Coupland, PT, DPT [phone]   06/01/2013, 4:25 PM

## 2013-06-01 NOTE — Progress Notes (Signed)
Subjective:  Sitting up comfortable in bed eating breakfast. Earlier this morning while standing at the sink, his heart rate increased to 140. Telemetry demonstrated what appears to be supraventricular tachycardia. Abrupt termination.  Objective:  Vital Signs in the last 24 hours: Temp:  [97.5 F (36.4 C)-98.3 F (36.8 C)] 98.3 F (36.8 C) (08/05 0521) Pulse Rate:  [57-70] 57 (08/05 0521) Resp:  [10-18] 18 (08/05 0521) BP: (116-165)/(58-83) 165/63 mmHg (08/05 0521) SpO2:  [97 %-100 %] 98 % (08/05 0521) Weight:  [70.217 kg (154 lb 12.8 oz)-73.483 kg (162 lb)] 70.217 kg (154 lb 12.8 oz) (08/05 0521)  Intake/Output from previous day: 08/04 0701 - 08/05 0700 In: 223 [P.O.:220; I.V.:3] Out: 625 [Urine:625]   Physical Exam: General: Well developed, well nourished, in no acute distress. Elderly Head:  Normocephalic and atraumatic. Lungs: Clear to auscultation and percussion. Heart: Normal S1 and S2.  3/6 systolic murmur, no rubs or gallops.  Abdomen: soft, non-tender, positive bowel sounds. Extremities: No clubbing or cyanosis. No edema. Neurologic: Alert and oriented x 3.    Lab Results:  Recent Labs  05/31/13 1424 05/31/13 1827  WBC 6.8 7.2  HGB 13.0 13.2  PLT 186 199    Recent Labs  05/31/13 1827 06/01/13 0500  NA 134* 133*  K 4.5 3.8  CL 99 99  CO2 27 26  GLUCOSE 96 86  BUN 12 15  CREATININE 1.05 1.10   Hepatic Function Panel  Recent Labs  05/31/13 1827  PROT 6.8  ALBUMIN 3.8  AST 18  ALT 12  ALKPHOS 61  BILITOT 0.8    Imaging: Dg Chest 2 View  06/01/2013   *RADIOLOGY REPORT*  Clinical Data: Difficulty breathing, slight chest pain, history hypertension, former smoker  CHEST - 2 VIEW  Comparison: 02/03/2013  Findings: Enlargement of cardiac silhouette. Atherosclerotic calcification aorta. Mediastinal contours and pulmonary vascularity normal. Lungs appear mildly emphysematous but clear. No pleural effusion or pneumothorax. Bones unremarkable.   IMPRESSION: Emphysematous changes. Enlargement of cardiac silhouette. No acute abnormalities.   Original Report Authenticated By: Ulyses Southward, M.D.   Personally viewed.   Telemetry: As above, no atrial fibrillation Personally viewed.     Cardiac Studies:  Severe aortic stenosis, branch vessel CAD  Assessment/Plan:  Principal Problem:   Acute diastolic CHF (congestive heart failure)   79 year old with severe aortic stenosis awaiting surgery with diastolic heart failure SVT, Branch vessel CAD (diagonal and obtuse marginal) .   - Reviewed Dr. Lorrin Mais note. Discussed with patient. I am fine with him proceeding with surgery as soon as possible. Patient will discuss further with surgical team. My fear is that if we send him home, he will have continued symptoms from his severe aortic stenosis.   - I started low-dose metoprolol because of supraventricular tachycardia noted earlier this morning. He will not tolerate this from an aortic valve standpoint.   - Continuing with low-dose Lasix. Currently appears comfortable.  SKAINS, MARK 06/01/2013, 8:29 AM

## 2013-06-01 NOTE — Progress Notes (Signed)
Pre-op Cardiac Surgery  Carotid Findings:  Bilateral:  Less than 39% ICA stenosis.  Vertebral artery flow is antegrade.    Limb Dopplers pending.  Upper Extremity Right Left  Brachial Pressures    Radial Waveforms    Ulnar Waveforms    Palmar Arch (Allen's Test)     Findings:      Lower  Extremity Right Left  Dorsalis Pedis    Anterior Tibial    Posterior Tibial    Ankle/Brachial Indices      Farrel Demark, RDMS, RVT 06/01/2013

## 2013-06-01 NOTE — Progress Notes (Signed)
The patient's HR went up into the 140s while he was ambulating.  He was symptomatic with SOB.  The patient briefly went from a first degree heart block/sinus rhythm to atrial fibrillation.  This lasted approximately five minutes.  After laying down in bed, the patient's HR fell between 110-120 and his SOB subsided.  His rhythm was junctional tachy at this point and returned to a first degree heart block/normal sinus rhythm in the 60s about 15 minutes later.  The physician on-call was notified.  No new orders.  The RN will continue to monitor the patient.

## 2013-06-01 NOTE — Care Management Note (Signed)
UR complete.  Johny Shock RN MPH Case Manager [phone]

## 2013-06-02 DIAGNOSIS — E871 Hypo-osmolality and hyponatremia: Secondary | ICD-10-CM | POA: Diagnosis present

## 2013-06-02 LAB — BASIC METABOLIC PANEL
CO2: 26 mEq/L (ref 19–32)
Calcium: 8.2 mg/dL — ABNORMAL LOW (ref 8.4–10.5)
Creatinine, Ser: 1.12 mg/dL (ref 0.50–1.35)
Glucose, Bld: 90 mg/dL (ref 70–99)

## 2013-06-02 LAB — URINALYSIS, ROUTINE W REFLEX MICROSCOPIC
Glucose, UA: NEGATIVE mg/dL
Hgb urine dipstick: NEGATIVE
Ketones, ur: NEGATIVE mg/dL
Protein, ur: NEGATIVE mg/dL

## 2013-06-02 NOTE — Progress Notes (Signed)
Subjective:  Standing up, doing well. He discussed his case yesterday with Dr. Tyrone Sage.  Objective:  Vital Signs in the last 24 hours: Temp:  [98.1 F (36.7 C)-98.4 F (36.9 C)] 98.2 F (36.8 C) (08/06 0522) Pulse Rate:  [54-72] 54 (08/06 0522) Resp:  [17-19] 19 (08/06 0522) BP: (120-175)/(54-78) 124/61 mmHg (08/06 0522) SpO2:  [96 %-98 %] 98 % (08/06 0522) Weight:  [70.171 kg (154 lb 11.2 oz)] 70.171 kg (154 lb 11.2 oz) (08/06 0522)  Intake/Output from previous day: 08/05 0701 - 08/06 0700 In: 1145 [P.O.:1142; I.V.:3] Out: 1025 [Urine:1025]   Physical Exam: General: Well developed, well nourished, in no acute distress. Elderly  Head: Normocephalic and atraumatic.  Lungs: Clear to auscultation and percussion.  Heart: Normal S1 and S2. 3/6 systolic murmur, no rubs or gallops.  Abdomen: soft, non-tender, positive bowel sounds.  Extremities: No clubbing or cyanosis. No edema.  Neurologic: Alert and oriented x 3.     Lab Results:  Recent Labs  05/31/13 1424 05/31/13 1827  WBC 6.8 7.2  HGB 13.0 13.2  PLT 186 199    Recent Labs  06/01/13 0500 06/02/13 0358  NA 133* 129*  K 3.8 3.7  CL 99 97  CO2 26 26  GLUCOSE 86 90  BUN 15 15  CREATININE 1.10 1.12   No results found for this basename: TROPONINI, CK, MB,  in the last 72 hours Hepatic Function Panel  Recent Labs  05/31/13 1827  PROT 6.8  ALBUMIN 3.8  AST 18  ALT 12  ALKPHOS 61  BILITOT 0.8   Imaging: Dg Chest 2 View  06/01/2013   *RADIOLOGY REPORT*  Clinical Data: Difficulty breathing, slight chest pain, history hypertension, former smoker  CHEST - 2 VIEW  Comparison: 02/03/2013  Findings: Enlargement of cardiac silhouette. Atherosclerotic calcification aorta. Mediastinal contours and pulmonary vascularity normal. Lungs appear mildly emphysematous but clear. No pleural effusion or pneumothorax. Bones unremarkable.  IMPRESSION: Emphysematous changes. Enlargement of cardiac silhouette. No acute  abnormalities.   Original Report Authenticated By: Ulyses Southward, M.D.   Personally viewed.   Telemetry: No further SVT Personally viewed.     Assessment/Plan:  Principal Problem:   Acute diastolic CHF (congestive heart failure) Active Problems:   Severe aortic stenosis   SVT (supraventricular tachycardia)   Hyposmolality and/or hyponatremia   - Discussed with Dr. Tyrone Sage  - He is reviewing case.   - Awaiting decision on timing of surgery   - Hyponatremia noted, fluid restriction placed. Continue to monitor.    Joseph Garner 06/02/2013, 8:41 AM

## 2013-06-02 NOTE — Progress Notes (Signed)
[id number] Came to see pt and family to introduce to Cardiac Rehab and importance of mobility after surgery. Discussed sternal precautions and demonstrated how to get up without using arms. Daughter in room to help with clarifying information for father. Gave [facility] booklet and wrote how to view preop video. Pt has IS at home but daughter stated father too SOB to even use prior to this admission. We will follow up after surgery. Luetta Nutting RNBSN

## 2013-06-02 NOTE — Progress Notes (Signed)
OT Cancellation Note  Patient Details Name: Joseph Garner MRN: [id number] DOB: 1924-10-23   Cancelled Treatment:      The pt is needing his AVR surgery moved up. Last MD note stated that he did not feel comfortable discharging pt home before he had this surgery. Based on events from yesterday & last evening (see RN and MD notes), as well as review of PT assessment, OT will hold off on evaluating this pt. Per PT note, pt is able to move about room independently and was independent with limited endurance PTA due to his current cardiac issues. Once he has had his AVR surgery please re-order OT as appropriate. Until then, will sign off OT.    Roselie Awkward Dixon 06/02/2013, 7:27 AM

## 2013-06-02 NOTE — Progress Notes (Signed)
The patient's HR stayed in the 70s and 80s while ambulating in the early part of the night.  Overnight, his HR stayed in the 50s and upper 40s while sleeping.

## 2013-06-03 DIAGNOSIS — I359 Nonrheumatic aortic valve disorder, unspecified: Secondary | ICD-10-CM

## 2013-06-03 DIAGNOSIS — Z01810 Encounter for preprocedural cardiovascular examination: Secondary | ICD-10-CM

## 2013-06-03 LAB — PRO B NATRIURETIC PEPTIDE: Pro B Natriuretic peptide (BNP): 2342 pg/mL — ABNORMAL HIGH (ref 0–450)

## 2013-06-03 LAB — COMPREHENSIVE METABOLIC PANEL
ALT: 9 U/L (ref 0–53)
AST: 15 U/L (ref 0–37)
Albumin: 4 g/dL (ref 3.5–5.2)
Alkaline Phosphatase: 66 U/L (ref 39–117)
BUN: 16 mg/dL (ref 6–23)
CO2: 25 mEq/L (ref 19–32)
Calcium: 9.1 mg/dL (ref 8.4–10.5)
Chloride: 95 mEq/L — ABNORMAL LOW (ref 96–112)
Creatinine, Ser: 1.03 mg/dL (ref 0.50–1.35)
GFR calc Af Amer: 73 mL/min — ABNORMAL LOW (ref >90)
GFR calc non Af Amer: 63 mL/min — ABNORMAL LOW (ref >90)
Glucose, Bld: 101 mg/dL — ABNORMAL HIGH (ref 70–99)
Potassium: 3.8 mEq/L (ref 3.5–5.1)
Sodium: 131 mEq/L — ABNORMAL LOW (ref 135–145)
Total Bilirubin: 0.7 mg/dL (ref 0.3–1.2)
Total Protein: 7.1 g/dL (ref 6.0–8.3)

## 2013-06-03 LAB — BLOOD GAS, ARTERIAL
Acid-base deficit: 0.2 mmol/L (ref 0.0–2.0)
Bicarbonate: 23.9 meq/L (ref 20.0–24.0)
Drawn by: 331471
FIO2: 0.21 %
O2 Saturation: 96.2 %
Patient temperature: 98.6
TCO2: 25.1 mmol/L (ref 0–100)
pCO2 arterial: 38.6 mmHg (ref 35.0–45.0)
pH, Arterial: 7.408 (ref 7.350–7.450)
pO2, Arterial: 77.9 mmHg — ABNORMAL LOW (ref 80.0–100.0)

## 2013-06-03 LAB — CBC
HCT: 37.1 % — ABNORMAL LOW (ref 39.0–52.0)
Hemoglobin: 13.6 g/dL (ref 13.0–17.0)
MCH: 34 pg (ref 26.0–34.0)
MCHC: 36.7 g/dL — ABNORMAL HIGH (ref 30.0–36.0)
MCV: 92.8 fL (ref 78.0–100.0)
Platelets: 198 10*3/uL (ref 150–400)
RBC: 4 MIL/uL — ABNORMAL LOW (ref 4.22–5.81)
RDW: 13.1 % (ref 11.5–15.5)
WBC: 8.4 10*3/uL (ref 4.0–10.5)

## 2013-06-03 LAB — PROTIME-INR
INR: 1.02 (ref 0.00–1.49)
Prothrombin Time: 13.2 seconds (ref 11.6–15.2)

## 2013-06-03 LAB — ABO/RH: ABO/RH(D): O NEG

## 2013-06-03 MED ORDER — POTASSIUM CHLORIDE 2 MEQ/ML IV SOLN
80.0000 meq | INTRAVENOUS | Status: DC
  Filled 2013-06-03: qty 40

## 2013-06-03 MED ORDER — MAGNESIUM SULFATE 50 % IJ SOLN
40.0000 meq | INTRAMUSCULAR | Status: DC
  Filled 2013-06-03: qty 10

## 2013-06-03 MED ORDER — NITROGLYCERIN IN D5W 200-5 MCG/ML-% IV SOLN
2.0000 ug/min | INTRAVENOUS | Status: AC
  Administered 2013-06-04: 5 ug/min via INTRAVENOUS
  Filled 2013-06-03: qty 250

## 2013-06-03 MED ORDER — DEXTROSE 5 % IV SOLN
750.0000 mg | INTRAVENOUS | Status: DC
  Filled 2013-06-03: qty 750

## 2013-06-03 MED ORDER — SODIUM CHLORIDE 0.9 % IV SOLN
INTRAVENOUS | Status: AC
  Administered 2013-06-04: 1 [IU]/h via INTRAVENOUS
  Filled 2013-06-03: qty 1

## 2013-06-03 MED ORDER — DEXTROSE 5 % IV SOLN
1.5000 g | INTRAVENOUS | Status: AC
  Administered 2013-06-04: 1.5 g via INTRAVENOUS
  Filled 2013-06-03 (×2): qty 1.5

## 2013-06-03 MED ORDER — DOPAMINE-DEXTROSE 3.2-5 MG/ML-% IV SOLN
2.0000 ug/kg/min | INTRAVENOUS | Status: DC
  Filled 2013-06-03: qty 250

## 2013-06-03 MED ORDER — CHLORHEXIDINE GLUCONATE 4 % EX LIQD
60.0000 mL | Freq: Once | CUTANEOUS | Status: AC
  Administered 2013-06-03: 4 via TOPICAL
  Filled 2013-06-03: qty 60

## 2013-06-03 MED ORDER — CHLORHEXIDINE GLUCONATE 4 % EX LIQD
60.0000 mL | Freq: Once | CUTANEOUS | Status: AC
  Administered 2013-06-04: 4 via TOPICAL
  Filled 2013-06-03: qty 60

## 2013-06-03 MED ORDER — DEXMEDETOMIDINE HCL IN NACL 400 MCG/100ML IV SOLN
0.1000 ug/kg/h | INTRAVENOUS | Status: AC
  Administered 2013-06-04: .2 ug/kg/h via INTRAVENOUS
  Filled 2013-06-03: qty 100

## 2013-06-03 MED ORDER — EPINEPHRINE HCL 1 MG/ML IJ SOLN
0.5000 ug/min | INTRAVENOUS | Status: DC
  Filled 2013-06-03: qty 4

## 2013-06-03 MED ORDER — SODIUM CHLORIDE 0.9 % IV SOLN
INTRAVENOUS | Status: DC
  Filled 2013-06-03: qty 30

## 2013-06-03 MED ORDER — SODIUM CHLORIDE 0.9 % IV SOLN
INTRAVENOUS | Status: AC
  Administered 2013-06-04: 69 mL/h via INTRAVENOUS
  Filled 2013-06-03: qty 40

## 2013-06-03 MED ORDER — VANCOMYCIN HCL 10 G IV SOLR
1250.0000 mg | INTRAVENOUS | Status: AC
  Administered 2013-06-04: 1250 mg via INTRAVENOUS
  Filled 2013-06-03 (×2): qty 1250

## 2013-06-03 MED ORDER — PAPAVERINE HCL 30 MG/ML IJ SOLN
INTRAMUSCULAR | Status: AC
  Administered 2013-06-04: 09:00:00
  Filled 2013-06-03: qty 2.5

## 2013-06-03 MED ORDER — PHENYLEPHRINE HCL 10 MG/ML IJ SOLN
30.0000 ug/min | INTRAVENOUS | Status: AC
  Administered 2013-06-04: 25 ug/min via INTRAVENOUS
  Filled 2013-06-03: qty 2

## 2013-06-03 MED ORDER — BISACODYL 5 MG PO TBEC
5.0000 mg | DELAYED_RELEASE_TABLET | Freq: Once | ORAL | Status: AC
  Administered 2013-06-03: 5 mg via ORAL
  Filled 2013-06-03: qty 1

## 2013-06-03 NOTE — Progress Notes (Signed)
Patient Name: Joseph Garner Date of Encounter: 06/03/2013    SUBJECTIVE: The patient is sitting at his bedside. He voices no complaints. He specifically denies dyspnea, dizziness/syncope, chest discomfort. He is been seen by Dr. Tyrone Sage and the plan is for aortic valve replacement and CABG in the a.m.  TELEMETRY:  Sinus bradycardia: Filed Vitals:   06/02/13 2102 06/02/13 2152 06/03/13 0509 06/03/13 0900  BP: 127/59 127/59 136/87 127/65  Pulse: 60 57 55 59  Temp:  97.7 F (36.5 C) 98.5 F (36.9 C)   TempSrc:  Oral Oral   Resp:  18 20   Height:      Weight:   70.4 kg (155 lb 3.3 oz)   SpO2:  95% 98%     Intake/Output Summary (Last 24 hours) at 06/03/13 1133 Last data filed at 06/03/13 0921  Gross per 24 hour  Intake   1123 ml  Output    600 ml  Net    523 ml    LABS: Basic Metabolic Panel:  Recent Labs  56/21/30 0358 06/03/13 0548  NA 129* 131*  K 3.7 3.8  CL 97 95*  CO2 26 25  GLUCOSE 90 101*  BUN 15 16  CREATININE 1.12 1.03  CALCIUM 8.2* 9.1   CBC:  Recent Labs  05/31/13 1827 06/03/13 0548  WBC 7.2 8.4  HGB 13.2 13.6  HCT 36.2* 37.1*  MCV 94.0 92.8  PLT 199 198   Radiology/Studies:  06/01/13 IMPRESSION:  Emphysematous changes.  Enlargement of cardiac silhouette.  No acute abnormalities.   Physical Exam: Blood pressure 127/65, pulse 59, temperature 98.5 F (36.9 C), temperature source Oral, resp. rate 20, height 5\' 8"  (1.727 m), weight 70.4 kg (155 lb 3.3 oz), SpO2 98.00%. Weight change: 0.228 kg (8.1 oz)   Mild pallor is noted  No JVD with the patient lying at 45. Transmitted bruits from the aortic valve are heard.  Chest is clear basally bilaterally.  Cardiac exam reveals a 4-5/6 crescendo decrescendo murmur compatible with aortic stenosis. No regurgitation is heard. An S4 gallop is audible.  No peripheral edema  ASSESSMENT:  1. Critical aortic stenosis awaiting aortic valve replacement with bioprosthesis  2. Asymptomatic critical  coronary disease to be bypassed at the time of aortic valve surgery  3. Acute on chronic diastolic heart failure, compensated to  4. Hyponatremia, asymptomatic and mild.  Plan:  1. Per TCTS, CABG and aortic valve replacement 06/04/13 2. I urged the patient to notify the staff if any cardiopulmonary symptoms.  3. The patient requests a shower. This will be okay with assistance.  4. No specific therapy for sodium of 131 Signed, Lesleigh Noe 06/03/2013, 11:33 AM

## 2013-06-03 NOTE — Consult Note (Signed)
[address]       [place] [address]             [id number]        [facility] Medical Record #[id number] Date of Birth: [birth year]  Referring: No ref. provider found Primary Care: Georgann Housekeeper, MD  Chief Complaint:    Chief Complaint  Patient presents with  . Shortness of Breath    History of Present Illness:     Patient was being evaluated by Dr Cornelius Moras for AVR followup of severe symptomatic aortic stenosis. He was originally seen in consultation on 04/27/2013. Since then he underwent left and right heart catheterization by Dr. Anne Fu on 05/12/2013. Cardiac catheterization was notable for moderate nonobstructive coronary artery disease with 80% stenosis of a small first diagonal branch off of the left anterior descending coronary artery and otherwise no significant flow limiting coronary artery disease. Pulmonary artery pressures and cardiac output were normal. Left ventriculogram was not performed and the transvalvular gradient across the aortic valve was not assessed.  CT angiogram of the chest abdomen and pelvis was notable for small interval increase in size of right lung nodule which had been seen previously in 2010.  The patient describes a gradual progression of symptoms of exertional shortness of breath. He now get short of breath with moderate physical activity. He states that occasionally he gets short of breath with minimal activity and at rest, but this seems to be very transient and sporadic. The patient describes occasional brief episodes of tightness across his upper chest without radiation that are transient and usually not necessarily related to physical activity. He has been having frequent dizzy spells and seemed to be somewhat positional and typically worse in the morning when he first gets up out of bed. He has not had syncope. He denies any PND or orthopnea but he has noted persistent bilateral lower extremity edema ever since his  inguinal hernia repair.  Patient admitted after onset of acute systolic heart failure, He notes after PT evaluation he has become very sob and unable to do much at home. Family notes deteriorated over past week and so came to emergency room and was admitted by Dr Mayford Knife.   Pateint was being consider as TVAR candidate since Jul1 but notes indicate he was not a candidate. Offered option to go to [facility] for consideration of TVAR but patient says too weak to go now.    Current Activity/ Functional Status: Patient is independent with mobility/ambulation, transfers, ADL's, IADL's.   Zubrod Score: At the time of surgery this patient's most appropriate activity status/level should be described as: []  Normal activity, no symptoms [x]  Symptoms, fully ambulatory []  Symptoms, in bed less than or equal to 50% of the time []  Symptoms, in bed greater than 50% of the time but less than 100% []  Bedridden []  Moribund  Past Medical History  Diagnosis Date  . BPH (benign prostatic hypertrophy)   . Diverticulosis 2006  . Depression   . Anemia   . Spinal stenosis   . Lung nodules 2010  . Actinic keratosis, hx of   . Macular degeneration   . Hypertension   . Allergy     allergic rhinitis  . Hearing loss     wears bilateral hearing aids  . Inguinal hernia     left side -surgery planned  . GERD (gastroesophageal reflux disease)   . Arthritis     generalized-more back issues  .  Severe aortic stenosis 04/16/2013  . Nodule of right lung 05/24/2013  . CHF (congestive heart failure)   . Shortness of breath     Past Surgical History  Procedure Laterality Date  . Tonsillectomy  1933  . Appendectomy  1947  . Neck surgery  1997    compressed nerve  . Skin biopsy  2003-2011    multiple   . Cataract extraction, bilateral      bilateral  . Inguinal hernia repair Left 02/11/2013    Procedure: LEFT LAPAROSCOPIC REPAIR INGUINAL HERNIA WITH MESH;  Surgeon: Ernestene Mention, MD;  Location: [facility];   Service: General;  Laterality: Left;  . Insertion of mesh Left 02/11/2013    Procedure: INSERTION OF MESH;  Surgeon: Ernestene Mention, MD;  Location: [facility];  Service: General;  Laterality: Left;  . Hernia repair  02/24/13    lap LIH repair    History  Smoking status  . Former Smoker -- 1.50 packs/day for 20 years  . Types: Cigarettes  . Quit date: 10/29/1967  Smokeless tobacco  . Never Used    History  Alcohol Use  . Yes    Comment: occasionally    History   Social History  . Marital Status: Married    Spouse Name: N/A    Number of Children: 1  . Years of Education: N/A   Occupational History  . retired from [employer]    Social History Main Topics  . Smoking status: Former Smoker -- 1.50 packs/day for 20 years    Types: Cigarettes    Quit date: 10/29/1967  . Smokeless tobacco: Never Used  . Alcohol Use: Yes     Comment: occasionally  . Drug Use: No  . Sexually Active: Not Currently      No Known Allergies  Current Facility-Administered Medications  Medication Dose Route Frequency Provider Last Rate Last Dose  . 0.9 %  sodium chloride infusion  250 mL Intravenous PRN Quintella Reichert, MD      . acetaminophen (TYLENOL) tablet 650 mg  650 mg Oral Q4H PRN Quintella Reichert, MD      . aspirin chewable tablet 81 mg  81 mg Oral Daily Quintella Reichert, MD   81 mg at 06/02/13 0918  . darifenacin (ENABLEX) 24 hr tablet 7.5 mg  7.5 mg Oral Daily Quintella Reichert, MD   7.5 mg at 06/02/13 0918  . doxazosin (CARDURA) tablet 4 mg  4 mg Oral QHS Quintella Reichert, MD   4 mg at 06/02/13 2102  . furosemide (LASIX) injection 20 mg  20 mg Intravenous Once Quintella Reichert, MD      . heparin injection 5,000 Units  5,000 Units Subcutaneous Q8H Quintella Reichert, MD   5,000 Units at 06/03/13 0542  . metoprolol tartrate (LOPRESSOR) tablet 12.5 mg  12.5 mg Oral BID Donato Schultz, MD   12.5 mg at 06/02/13 2102  . ondansetron (ZOFRAN) injection 4 mg  4 mg Intravenous Q6H PRN Quintella Reichert, MD      .  pantoprazole (PROTONIX) EC tablet 40 mg  40 mg Oral Daily Quintella Reichert, MD   40 mg at 06/02/13 0917  . sodium chloride 0.9 % injection 3 mL  3 mL Intravenous Q12H Quintella Reichert, MD   3 mL at 06/02/13 2103  . sodium chloride 0.9 % injection 3 mL  3 mL Intravenous PRN Quintella Reichert, MD        Prescriptions prior to  admission  Medication Sig Dispense Refill  . aspirin 81 MG tablet Take 81 mg by mouth daily.      Marland Kitchen doxazosin (CARDURA) 4 MG tablet Take 4 mg by mouth at bedtime.      . fluticasone (FLONASE) 50 MCG/ACT nasal spray Place 2 sprays into the nose daily as needed for allergies.       Marland Kitchen loratadine (CLARITIN) 10 MG tablet Take 10 mg by mouth daily as needed for allergies.       . meloxicam (MOBIC) 15 MG tablet Take 15 mg by mouth as needed for pain.      . Multiple Vitamin (MULTIVITAMIN WITH MINERALS) TABS Take 1 tablet by mouth daily.      . Multiple Vitamins-Minerals (PRESERVISION AREDS PO) Take 2 capsules by mouth daily.       Marland Kitchen omeprazole (PRILOSEC OTC) 20 MG tablet Take 20 mg by mouth daily.      . solifenacin (VESICARE) 5 MG tablet Take 5 mg by mouth daily.        Family History  Problem Relation Age of Onset  . Prostate cancer Father   . Cancer Mother     passed away age 54     Review of Systems:     Cardiac Review of Systems: Y or N  Chest Pain [ y   ]  Resting SOB [  y ] Exertional SOB  Cove.Etienne  ]  Orthopnea Cove.Etienne  ]   Pedal Edema Cove.Etienne   ]    Palpitations [n] Syncope  [n  ]   Presyncope [ y ]  General Review of Systems: [Y] = yes [  ]=no Constitional: recent weight change [ n ]; anorexia [n ]; fatigue Cove.Etienne  ]; nausea [ n ]; night sweats [ n ]; fever [ n ]; or chills [n  ]                                                               Dental: poor dentition[  ]; Last Dentist visit:   Eye : blurred vision [  ]; diplopia [   ]; vision changes [  ];  Amaurosis fugax[  ]; Resp: cough [  ];  wheezing[  ];  hemoptysis[  ]; shortness of breath[  ]; paroxysmal nocturnal dyspnea[  ];  dyspnea on exertion[  ]; or orthopnea[  ];  GI:  gallstones[  ], vomiting[  ];  dysphagia[  ]; melena[  ];  hematochezia [  ]; heartburn[  ];   Hx of  Colonoscopy[  ]; GU: kidney stones [ n ]; hematuria[ n ];   dysuria [ n ];  nocturia[  y 7 times per night];  history of     obstruction [ y ]; urinary frequency Cove.Etienne  ]             Skin: rash, swelling[  ];, hair loss[  ];  peripheral edema[  ];  or itching[  ]; Musculosketetal: myalgias[  ];  joint swelling[  ];  joint erythema[  ];  joint pain[  ];  back pain[  ];  Heme/Lymph: bruising[  ];  bleeding[  ];  anemia[  ];  Neuro: TIA[  n];  headaches[  ];  stroke[  ];  vertigo[  ];  seizures[  ];   paresthesias[  ];  difficulty walking[n  ];  Psych:depression[y  ]; anxiety[  y];  Endocrine: diabetes[ n ];  thyroid dysfunction[n  ];  Immunizations: Flu [  y; Pneumococcal[ y ];  Other:  Physical Exam: BP 136/87  Pulse 55  Temp(Src) 98.5 F (36.9 C) (Oral)  Resp 20  Ht 5\' 8"  (1.727 m)  Wt 155 lb 3.3 oz (70.4 kg)  BMI 23.6 kg/m2  SpO2 98%  General appearance: alert, cooperative, appears stated age and no distress Neurologic: intact Heart: systolic murmur: holosystolic 4/6, crescendo throughout the precordium Lungs: clear to auscultation bilaterally Abdomen: soft, non-tender; bowel sounds normal; no masses,  no organomegaly Extremities: extremities normal, atraumatic, no cyanosis or edema and Homans sign is negative, no sign of DVT   Diagnostic Studies & Laboratory data:     Recent Radiology Findings:  Dg Chest 2 View  06/01/2013   *RADIOLOGY REPORT*  Clinical Data: Difficulty breathing, slight chest pain, history hypertension, former smoker  CHEST - 2 VIEW  Comparison: 02/03/2013  Findings: Enlargement of cardiac silhouette. Atherosclerotic calcification aorta. Mediastinal contours and pulmonary vascularity normal. Lungs appear mildly emphysematous but clear. No pleural effusion or pneumothorax. Bones unremarkable.  IMPRESSION:  Emphysematous changes. Enlargement of cardiac silhouette. No acute abnormalities.   Original Report Authenticated By: Ulyses Southward, M.D.   Nm Pet Image Initial (pi) Skull Base To Thigh  05/26/2013   *RADIOLOGY REPORT*  Clinical Data: Initial treatment strategy for lung nodule.  NUCLEAR MEDICINE PET SKULL BASE TO THIGH  Fasting Blood Glucose:  98  Technique:  18.9 mCi F-18 FDG was injected intravenously. CT data was obtained and used for attenuation correction and anatomic localization only.  (This was not acquired as a diagnostic CT examination.) Additional exam technical data entered on technologist worksheet.  Comparison:  CTA of the chest, abdomen and pelvis 05/20/2013.  Findings:  Neck: No hypermetabolic lymph nodes in the neck.  Chest:  The nodule of concern in the right middle lobe is unchanged in size measuring 1.5 x 1.3 cm.  This lesion demonstrates no significant hypermetabolism (SUVmax = 1.4)on the PET portion of the examination.No other suspicious appearing pulmonary nodules or masses are noted. No hypermetabolic mediastinal or hilar nodes.  Abdomen/Pelvis:  No abnormal hypermetabolic activity within the liver, pancreas, adrenal glands, or spleen.  No hypermetabolic lymph nodes in the abdomen or pelvis.  Skeleton:  No focal hypermetabolic activity to suggest skeletal metastasis.  IMPRESSION: 1.  1.5 x 1.3 cm non hypermetabolic nodule in the right middle lobe.  Given these findings, and despite the slow interval growth compared to remote prior examinations, this is favored to be a benign nodule, potentially a slow growing hamartoma.  No suspicious findings to suggest malignancy within the neck, chest, abdomen or pelvis are identified on today's examination.   Original Report Authenticated By: Trudie Reed, M.D.   Ct Angio Chest Aorta W/cm &/or Wo/cm  05/20/2013   *RADIOLOGY REPORT*  Clinical Data:  74-YEAR-OLD MALE WITH SEVERE AORTIC STENOSIS. PREPROCEDURAL STUDY FOR POTENTIAL TRANSCATHETER AORTIC  VALVE REPLACEMENT (TAVR).  CT ANGIOGRAPHY CHEST, ABDOMEN AND PELVIS  Technique:  Multidetector CT imaging through the chest, abdomen and pelvis was performed using the standard protocol during bolus administration of intravenous contrast.  Multiplanar reconstructed images including MIPs were obtained and reviewed to evaluate the vascular anatomy.  Contrast: OMNIPAQUE IOHEXOL 350 MG/ML SOLN  Comparison:  PET CT 07/14/2009.  CTA CHEST  Findings:  Mediastinum: Heart size is mildly enlarged, and  apex of the heart is displaced laterally related to the patient's pectus excavatum. Trace amount of pericardial fluid and/or thickening, unlikely to be of hemodynamic significance at this time. There is atherosclerosis of the thoracic aorta, the great vessels of the mediastinum and the coronary arteries, including calcified atherosclerotic plaque in the left main, left anterior descending, left circumflex and right coronary arteries. Marked thickening and calcification of the aortic valve, compatible with the given clinical diagnosis of aortic stenosis.  Minimal diameter of the right subclavian artery occurrs shortly after its origin (7.7 mm).  Minimal diameter of the left subclavian artery is in the mid vessel where it measures approximately 7 mm in diameter.  Ectasia of the ascending thoracic aorta which measures up to 4.2 x 4.0 cm.  The thoracic aortic arch measures up to 3.0 cm in diameter, and the descending thoracic aorta measures 2.7 cm in diameter. No pathologically enlarged mediastinal or hilar lymph nodes. Esophagus is unremarkable in appearance.  Lungs/Pleura: Previously noted right upper lobe nodule has increased slightly in size, currently measuring 1.5 x 1.4 cm (image 91 of series 5).  This is macrolobulated with subtle surrounding spiculations, abutting the pleura anteriorly and inferiorly (this is immediately adjacent to the minor fissure).  Areas of mild dependent atelectasis are noted in the lower lobes  of the lungs bilaterally.  No acute consolidative airspace disease.  No significant pleural effusions.  Musculoskeletal: Pectus excavatum. There are no aggressive appearing lytic or blastic lesions noted in the visualized portions of the skeleton.   Review of the MIP images confirms the above findings.  IMPRESSION: 1. Interval enlargement of what is now a 1.5 x 1.4 cm macrolobulated nodule in the inferior aspect of the right upper lobe abutting the minor fissure.  This has subtle spiculated margins and is concerning for a slow-growing neoplasm such as a low grade adenocarcinoma (which may account for lack of hypermetabolic activity on prior PET examination).  Consideration for CT guided biopsy to exclude malignancy is recommended if clinically indicated. 2.  Right and left subclavian arteries measure approximately 7.7 and 7.0 cm in diameter respectively. 3. Atherosclerosis, including left main and three-vessel coronary artery disease. In addition, there is mild ectasia of the ascending thoracic aorta (4.2 x 4.0 cm). 4.  Mild cardiomegaly. 5.  Pectus excavatum.  Because of this, the apex of the left ventricle was laterally displaced all located between the anterior aspects of the left sixth and seventh ribs on today's examination. This may have complications for attempted transapical placement of TAVR device.  CTA ABDOMEN AND PELVIS  VASCULAR MEASUREMENTS PERTINENT TO TAVR:  AORTA:  Minimal Aortic Diameter - 18 x 18 mm in the infrarenal abdominal aorta  Severity of Aortic Calcification -  mild  RIGHT PELVIS:  Right Common Iliac Artery -  Minimal Diameter - 10.2 x 8.5 mm  Tortuosity - moderate  Calcification - moderate  Right External Iliac Artery -  Minimal Diameter - 9.5 x 11.7 mm  Tortuosity - severe  Calcification - minimal  Right Common Femoral Artery -  Minimal Diameter - 9.2 x 9.5 mm  Tortuosity - mild  Calcification - mild  LEFT PELVIS:  Left Common Iliac Artery -  Minimal Diameter - 11.0 x 12.5 mm  Tortuosity  - mild  Calcification - moderate  Left External Iliac Artery -  Minimal Diameter - 9.3 x 8.9 mm  Tortuosity - severe  Calcification - minimal  Left Common Femoral Artery -  Minimal Diameter - 9.5 x 8.7  mm  Tortuosity - mild  Calcification - mild  Other Findings:  Abdomen/Pelvis:  The appearance of the liver, gallbladder, pancreas, spleen, right adrenal gland and the left kidney is unremarkable.  Mild nodular thickening of the left adrenal gland is noted, but is unchanged compared to prior examinations and presumably benign.  Several subcentimeter low attenuation lesions in the right kidney are too small to definitively characterize, but favored to represent small cysts.  The prostate gland is markedly enlarged measuring approximately 5.8 x 7.3 cm.  Trace volume of ascites in the pelvis is of uncertain etiology and significance, but is unusual in a male patient.  Numerous colonic diverticula are noted, particularly in the region of the sigmoid colon, without surrounding inflammatory changes to suggest acute diverticulitis at this time.  No pneumoperitoneum.  No pathologic distension of small bowel.  No definite pathologic lymphadenopathy identified within the abdomen or pelvis.  Postoperative changes of mesh repair for presumed hernia in the lower left anterior abdominal wall.  Musculoskeletal: There are no aggressive appearing lytic or blastic lesions noted in the visualized portions of the skeleton.   Review of the MIP images confirms the above findings.  IMPRESSION: 1.  Vascular findings and measurements pertinent to potential TAVR procedure, as discussed above.  Although the pelvic arteries are large enough for pelvic arterial access for the procedure, the severe tortuosity of the external iliac arteries should be noted as this may have implications for potential pelvic arterial access. 2.  Severely enlarged prostate gland. 3.  Mild nodularity of the left adrenal gland is unchanged compared to the prior study,  and therefore favored to be benign. 4.  Trace volume of ascites in the anatomic pelvis is unusual in a male patient, but is of uncertain etiology and significance. 5.  Extensive colonic diverticulosis without findings to suggest acute diverticulitis at this time. 6.  Additional incidental findings, as above.   Original Report Authenticated By: Trudie Reed, M.D.   CATH: CARDIAC CATHETERIZATION  PROCEDURE: Right heart catheterization with selective coronary angiography, cardiac output, selective oxygen saturations, abdominal aortogram.  INDICATIONS: 79 year old male with severe aortic stenosis, symptomatic, preoperative angiogram.  The risks, benefits, and details of the procedure were explained to the patient, including stroke, heart attack, death, renal impairment, arterial damage, bleeding. The patient verbalized understanding and wanted to proceed. Informed written consent was obtained.  PROCEDURE TECHNIQUE: After Xylocaine anesthesia and visualization of the femoral head via fluoroscopy, a 72F sheath was placed in the right femoral artery using the modified Seldinger technique. A 7 French sheath was inserted into the right femoral vein using the modified Seldinger technique. Left coronary angiography was done using a Judkins L4 catheter. Right coronary angiography was done using a no torque Williams right catheter. Multiple views with hand injection of Omnipaque were obtained. Abdominal aortogram was performed with pigtail catheter in descending aorta. Left ventriculogram was not performed. Aortic valve was not crossed. Right heart catheterization was performed with a balloon tipped Swan-Ganz catheter traversing the right-sided heart structures to the wedge position. Oxygen saturation was drawn in the pulmonary artery as well as femoral artery. Hemodynamics were obtained. Cardiac output was obtained utilizing the Fick methods. Following the procedure, sheaths were removed and patient was hemodynamically  stable.  CONTRAST: Total of 80 ml.  FLOUROSCOPY TIME: 7.4 minutes.  COMPLICATIONS: None.  HEMODYNAMICS:  Right atrium (RA): 4/2/1 mmHg  Right ventricle (RV): 17/1 mmHg  Pulmonary artery (PA): 17/6/9 mmHg  Pulmonary capillary wedge pressure (PCWP): 11/11/8 mmHg  Cardiac  output: Fick 5.6 liters/min  Cardiac index: Fick 3  PA saturation: 74%  FA saturation: 94%  Aortic pressure: 130/54/82 mmHg  ANGIOGRAPHIC DATA:  Left main: Minor tapering of the left main at the bifurcation of the LAD and circumflex artery. No flow-limiting coronary artery disease.  Left anterior descending (LAD): There is significant proximal and mid vessel calcification. There 3 significant diagonal branches. The first diagonal branch is small in caliber and demonstrates proximal 80% stenosis. The remainder of the vessel fills adequately. In the mid LAD, there is a bifurcation diagonal. At this juncture, there is approximately 30-40 % stenosis, but does not appear to be flow-limiting.  Circumflex artery (CIRC): There is a high first obtuse marginal/ramus branch which demonstrates a proximal 80% stenosis. This vessel then bifurcates. Fairly small caliber vessel as well. The native circumflex/large obtuse marginal demonstrates 20-30% stenosis ostially, 50-60% stenosis in the midsegment which do not appear to be flow-limiting.  Right coronary artery (RCA): Dominant vessel giving rise to the posterior descending artery. Minor proximal calcification. Minor luminal irregularities. No flow limiting CAD present.  LEFT VENTRICULOGRAM: Not performed  Abdominal aortogram:  No abnormal aortic aneurysm, widely patent iliac vessels as well as common femoral vessels.  IMPRESSIONS:  1. No proximal large vessel coronary artery disease. Calcified LAD with moderate CAD present, midsegment at the bifurcation of third diagonal. Proximal diagonal, small caliber vessel, stenosis of 80%. High obtuse marginal stenosis of approximately 80%, fairly  small caliber vessel. 2. No abdominal aortic aneurysm. Iliac vessels as well as common femoral vessels appear normal. 3. Normal right-sided heart pressures. 4. Normal cardiac output. RECOMMENDATION: Will relay to Dr. Cornelius Moras. Proceed with aortic valve replacement. One can contemplate revascularization of these branching vessels as described above however the risk versus the benefit of added time during procedure as well as feasibility of bypass on such small caliber vessels has to be weighed. It would not be unreasonable to continue with medical management of this coronary disease.   Pulmonary Function Tests  Baseline  FVC 3.35 L (107% predicted)  FEV1 2.52 L (118% predicted)  FEF25-75 1.97 L (152% predicted)  No diffusion capacity done  ECHO scanned in epic  Peak aortic velocity 4.5 cm Mean 50 aortic gradient Aortic root 4.1  STS score 5.5  Recent Lab Findings:  Lab Results  Component Value Date   WBC 8.4 06/03/2013   HGB 13.6 06/03/2013   HCT 37.1* 06/03/2013   PLT 198 06/03/2013   GLUCOSE 101* 06/03/2013   CHOL  Value: 148        ATP III CLASSIFICATION:  <200     mg/dL   Desirable  161-096  mg/dL   Borderline High  >=045    mg/dL   High        02/03/8118   TRIG 39 01/19/2009   HDL 44 01/19/2009   LDLCALC  Value: 96        Total Cholesterol/HDL:CHD Risk Coronary Heart Disease Risk Table                     Men   Women  1/2 Average Risk   3.4   3.3  Average Risk       5.0   4.4  2 X Average Risk   9.6   7.1  3 X Average Risk  23.4   11.0        Use the calculated Patient Ratio above and the CHD Risk Table to determine the patient's CHD Risk.  ATP III CLASSIFICATION (LDL):  <100     mg/dL   Optimal  161-096  mg/dL   Near or Above                    Optimal  130-159  mg/dL   Borderline  045-409  mg/dL   High  >811     mg/dL   Very High 07/11/7828   ALT 9 06/03/2013   AST 15 06/03/2013   NA 131* 06/03/2013   K 3.8 06/03/2013   CL 95* 06/03/2013   CREATININE 1.03 06/03/2013   BUN 16 06/03/2013   CO2 25  06/03/2013   TSH 1.697 05/31/2013   INR 1.02 06/03/2013   HGBA1C  Value: 5.3 (NOTE)   The ADA recommends the following therapeutic goal for glycemic   control related to Hgb A1C measurement:   Goal of Therapy:   < 7.0% Hgb A1C   Reference: American Diabetes Association: Clinical Practice   Recommendations 2008, Diabetes Care,  2008, 31:(Suppl 1). 01/19/2009      Assessment / Plan:    Symptomatic Critical Aortic stenosis with CAD of one vessel now in acute systolic heart failure Mildly  dilated ascending aorta 4.1-4.3 cm  Rt lung nodule with no tissue DX  Have spent 70 min seeing patient and reviewing with them the work up done and treatment options Have recommended proceeding with tissue AVR, poss wedge resection of rt lung nodule. The STS score calculated by me is 5.5 ( higher then Dr Barry Dienes calculation ) but still in reasonable range to proceed.   The goals risks and alternatives of the planned surgical procedure  AVR, poss rt lung wedge resection, poss CABG  have been discussed with the patient in detail. The risks of the procedure including death, infection, stroke,heart block and need for pacer, myocardial infarction, bleeding, blood transfusion have all been discussed specifically.  I have quoted [name] a 6 % of perioperative mortality and a complication rate as high as 40 %. The patient's questions have been answered.[name] Life is willing  to proceed with the planned procedure.   Delight Ovens MD      [address] [facility].[address] [place] [address] Office [phone]   Beeper [phone]  06/03/2013 9:40 AM

## 2013-06-03 NOTE — Progress Notes (Signed)
Pre-op Cardiac Surgery  Carotid Findings:  Bilateral:  Less than 39% ICA stenosis.  Vertebral artery flow is antegrade.    Limb Dopplers pending.  Upper Extremity Right Left  Brachial Pressures 135 Triphasic 133 Triphasic  Radial Waveforms Biphasic Triphasic  Ulnar Waveforms Biphasic Briphasic  Palmar Arch (Allen's Test) Normal Abnormal   Findings:    Palmar arch evaluation - Doppler waveforms on the right remained normal with both radial and ulnar compressions. Left Doppler waveforms remained normal with radial compression and reversed with ulnar compressions.  [employer], RVS 06/03/2013 8:30 AM  Farrel Demark, RDMS, RVT 06/03/2013

## 2013-06-03 NOTE — Progress Notes (Signed)
The patient did not have any acute changes overnight. 

## 2013-06-03 NOTE — Progress Notes (Signed)
Family at bedside, NAD noted, Cardiac Surgery Video watched by family and patient, all questions answered, remains alert and oriented times 4, able to communicate needs, consents completed per MD orders and vascular study complete, will continue to monitor.

## 2013-06-03 NOTE — Care Management Note (Addendum)
    Page 1 of 2   06/15/2013     1:11:24 PM   CARE MANAGEMENT NOTE 06/15/2013  Patient:  Joseph Garner   Account Number:  [id number]  Date Initiated:  06/03/2013  Documentation initiated by:  Tera Mater  Subjective/Objective Assessment:   79yo male Worsening DOE secondary to acute diastolic CHF and severe AS.  Pt. lives with spouse, in [place]     Action/Plan:   discharge planning   Anticipated DC Date:  06/11/2013   Anticipated DC Plan:  SKILLED NURSING FACILITY  In-house referral  Clinical Social Worker      DC Planning Services  CM consult      Choice offered to / List presented to:             Status of service:  Completed, signed off Medicare Important Message given?   (If response is "NO", the following Medicare IM given date fields will be blank) Date Medicare IM given:   Date Additional Medicare IM given:    Discharge Disposition:  SKILLED NURSING FACILITY  Per UR Regulation:  Reviewed for med. necessity/level of care/duration of stay  If discussed at Long Length of Stay Meetings, dates discussed:   06/08/2013  06/10/2013  06/15/2013    Comments:  06/15/13 Joseph Fanning Arinze Rivadeneira,RN,BSN [phone] PT DISCHARGING TO [facility] TODAY, PER CSW ARRANGEMENTS.  06-07-13 4pm Joseph Garner, RNBSN [phone] Post op AVR and CABG on 06-04-13. [facility].  06/03/13 1100 In to complete heart failure home health screen.  Pt and family stated he wanted to go to the [facility] in [place] when he is ready for dc.  He states he has gone to the facility PTA, and this would be his first choice.  CSW consult placed.  Pt. to have AVR tomorrow.  Will need PT/OT evaluation after surgery to assist with disposition.  Family is supportive and would assist if he should be able to return home. Tera Mater, RN, BSN NCM [phone]

## 2013-06-04 ENCOUNTER — Encounter (HOSPITAL_COMMUNITY): Admission: RE | Payer: Self-pay | Source: Ambulatory Visit

## 2013-06-04 ENCOUNTER — Inpatient Hospital Stay (HOSPITAL_COMMUNITY): Payer: Medicare Other

## 2013-06-04 ENCOUNTER — Encounter (HOSPITAL_COMMUNITY): Payer: Self-pay | Admitting: Anesthesiology

## 2013-06-04 ENCOUNTER — Encounter (HOSPITAL_COMMUNITY)
Admission: EM | Disposition: A | Discharge status: Skilled Nursing Facility | Payer: Self-pay | Source: Home / Self Care | Attending: Cardiothoracic Surgery

## 2013-06-04 ENCOUNTER — Inpatient Hospital Stay (HOSPITAL_COMMUNITY): Payer: Medicare Other | Admitting: Anesthesiology

## 2013-06-04 DIAGNOSIS — D386 Neoplasm of uncertain behavior of respiratory organ, unspecified: Secondary | ICD-10-CM

## 2013-06-04 DIAGNOSIS — I359 Nonrheumatic aortic valve disorder, unspecified: Secondary | ICD-10-CM

## 2013-06-04 DIAGNOSIS — I2510 Atherosclerotic heart disease of native coronary artery without angina pectoris: Secondary | ICD-10-CM

## 2013-06-04 HISTORY — PX: CORONARY ARTERY BYPASS GRAFT: SHX141

## 2013-06-04 HISTORY — PX: INTRAOPERATIVE TRANSESOPHAGEAL ECHOCARDIOGRAM: SHX5062

## 2013-06-04 HISTORY — PX: WEDGE RESECTION: SHX5070

## 2013-06-04 HISTORY — PX: AORTIC VALVE REPLACEMENT: SHX41

## 2013-06-04 LAB — POCT I-STAT 4, (NA,K, GLUC, HGB,HCT)
Glucose, Bld: 94 mg/dL (ref 70–99)
Glucose, Bld: 115 mg/dL — ABNORMAL HIGH (ref 70–99)
Glucose, Bld: 107 mg/dL — ABNORMAL HIGH (ref 70–99)
Glucose, Bld: 100 mg/dL — ABNORMAL HIGH (ref 70–99)
Glucose, Bld: 99 mg/dL (ref 70–99)
Glucose, Bld: 111 mg/dL — ABNORMAL HIGH (ref 70–99)
Glucose, Bld: 106 mg/dL — ABNORMAL HIGH (ref 70–99)
HCT: 31 % — ABNORMAL LOW (ref 39.0–52.0)
HCT: 30 % — ABNORMAL LOW (ref 39.0–52.0)
HCT: 26 % — ABNORMAL LOW (ref 39.0–52.0)
HCT: 26 % — ABNORMAL LOW (ref 39.0–52.0)
HCT: 26 % — ABNORMAL LOW (ref 39.0–52.0)
HCT: 24 % — ABNORMAL LOW (ref 39.0–52.0)
HCT: 27 % — ABNORMAL LOW (ref 39.0–52.0)
Hemoglobin: 10.5 g/dL — ABNORMAL LOW (ref 13.0–17.0)
Hemoglobin: 10.2 g/dL — ABNORMAL LOW (ref 13.0–17.0)
Hemoglobin: 8.8 g/dL — ABNORMAL LOW (ref 13.0–17.0)
Hemoglobin: 8.8 g/dL — ABNORMAL LOW (ref 13.0–17.0)
Hemoglobin: 8.8 g/dL — ABNORMAL LOW (ref 13.0–17.0)
Hemoglobin: 8.2 g/dL — ABNORMAL LOW (ref 13.0–17.0)
Hemoglobin: 9.2 g/dL — ABNORMAL LOW (ref 13.0–17.0)
Potassium: 3.8 mEq/L (ref 3.5–5.1)
Potassium: 3.8 mEq/L (ref 3.5–5.1)
Potassium: 3.7 mEq/L (ref 3.5–5.1)
Potassium: 4.4 mEq/L (ref 3.5–5.1)
Potassium: 4.7 mEq/L (ref 3.5–5.1)
Potassium: 4 mEq/L (ref 3.5–5.1)
Potassium: 3.8 mEq/L (ref 3.5–5.1)
Sodium: 134 mEq/L — ABNORMAL LOW (ref 135–145)
Sodium: 134 mEq/L — ABNORMAL LOW (ref 135–145)
Sodium: 131 mEq/L — ABNORMAL LOW (ref 135–145)
Sodium: 132 mEq/L — ABNORMAL LOW (ref 135–145)
Sodium: 133 mEq/L — ABNORMAL LOW (ref 135–145)
Sodium: 134 mEq/L — ABNORMAL LOW (ref 135–145)
Sodium: 135 mEq/L (ref 135–145)

## 2013-06-04 LAB — POCT I-STAT, CHEM 8
BUN: 12 mg/dL (ref 6–23)
Calcium, Ion: 1.19 mmol/L (ref 1.13–1.30)
Chloride: 102 mEq/L (ref 96–112)
Creatinine, Ser: 0.9 mg/dL (ref 0.50–1.35)
Glucose, Bld: 125 mg/dL — ABNORMAL HIGH (ref 70–99)
HCT: 24 % — ABNORMAL LOW (ref 39.0–52.0)
Hemoglobin: 8.2 g/dL — ABNORMAL LOW (ref 13.0–17.0)
Potassium: 4.2 mEq/L (ref 3.5–5.1)
Sodium: 135 mEq/L (ref 135–145)
TCO2: 19 mmol/L (ref 0–100)

## 2013-06-04 LAB — LIPID PANEL
Cholesterol: 120 mg/dL (ref 0–200)
HDL: 50 mg/dL (ref >39)
LDL Cholesterol: 60 mg/dL (ref 0–99)
Total CHOL/HDL Ratio: 2.4 RATIO
Triglycerides: 51 mg/dL (ref <150)
VLDL: 10 mg/dL (ref 0–40)

## 2013-06-04 LAB — CBC
HCT: 32.2 % — ABNORMAL LOW (ref 39.0–52.0)
HCT: 27.3 % — ABNORMAL LOW (ref 39.0–52.0)
HCT: 24.6 % — ABNORMAL LOW (ref 39.0–52.0)
Hemoglobin: 11.7 g/dL — ABNORMAL LOW (ref 13.0–17.0)
Hemoglobin: 10.1 g/dL — ABNORMAL LOW (ref 13.0–17.0)
Hemoglobin: 8.9 g/dL — ABNORMAL LOW (ref 13.0–17.0)
MCH: 33.8 pg (ref 26.0–34.0)
MCH: 34.1 pg — ABNORMAL HIGH (ref 26.0–34.0)
MCH: 33.7 pg (ref 26.0–34.0)
MCHC: 36.3 g/dL — ABNORMAL HIGH (ref 30.0–36.0)
MCHC: 36.2 g/dL — ABNORMAL HIGH (ref 30.0–36.0)
MCV: 93.1 fL (ref 78.0–100.0)
MCV: 92.2 fL (ref 78.0–100.0)
MCV: 93.2 fL (ref 78.0–100.0)
Platelets: 178 10*3/uL (ref 150–400)
Platelets: 105 10*3/uL — ABNORMAL LOW (ref 150–400)
Platelets: 107 10*3/uL — ABNORMAL LOW (ref 150–400)
RBC: 3.46 MIL/uL — ABNORMAL LOW (ref 4.22–5.81)
RBC: 2.96 MIL/uL — ABNORMAL LOW (ref 4.22–5.81)
RBC: 2.64 MIL/uL — ABNORMAL LOW (ref 4.22–5.81)
RDW: 13.3 % (ref 11.5–15.5)
RDW: 13.6 % (ref 11.5–15.5)
WBC: 7.6 10*3/uL (ref 4.0–10.5)
WBC: 16 10*3/uL — ABNORMAL HIGH (ref 4.0–10.5)

## 2013-06-04 LAB — POCT I-STAT 3, ART BLOOD GAS (G3+)
Acid-base deficit: 2 mmol/L (ref 0.0–2.0)
Acid-base deficit: 1 mmol/L (ref 0.0–2.0)
Bicarbonate: 25.9 mEq/L — ABNORMAL HIGH (ref 20.0–24.0)
Bicarbonate: 22.8 mEq/L (ref 20.0–24.0)
Bicarbonate: 22 mEq/L (ref 20.0–24.0)
O2 Saturation: 100 %
O2 Saturation: 100 %
O2 Saturation: 99 %
Patient temperature: 36.4
TCO2: 27 mmol/L (ref 0–100)
TCO2: 24 mmol/L (ref 0–100)
TCO2: 23 mmol/L (ref 0–100)
pCO2 arterial: 49.7 mmHg — ABNORMAL HIGH (ref 35.0–45.0)
pCO2 arterial: 39 mmHg (ref 35.0–45.0)
pCO2 arterial: 30.1 mmHg — ABNORMAL LOW (ref 35.0–45.0)
pH, Arterial: 7.325 — ABNORMAL LOW (ref 7.350–7.450)
pH, Arterial: 7.376 (ref 7.350–7.450)
pH, Arterial: 7.47 — ABNORMAL HIGH (ref 7.350–7.450)
pO2, Arterial: 309 mmHg — ABNORMAL HIGH (ref 80.0–100.0)
pO2, Arterial: 339 mmHg — ABNORMAL HIGH (ref 80.0–100.0)
pO2, Arterial: 137 mmHg — ABNORMAL HIGH (ref 80.0–100.0)

## 2013-06-04 LAB — COMPREHENSIVE METABOLIC PANEL
ALT: 10 U/L (ref 0–53)
AST: 13 U/L (ref 0–37)
Albumin: 3.3 g/dL — ABNORMAL LOW (ref 3.5–5.2)
Alkaline Phosphatase: 52 U/L (ref 39–117)
BUN: 16 mg/dL (ref 6–23)
CO2: 27 mEq/L (ref 19–32)
Calcium: 8.6 mg/dL (ref 8.4–10.5)
Chloride: 95 mEq/L — ABNORMAL LOW (ref 96–112)
Creatinine, Ser: 1.15 mg/dL (ref 0.50–1.35)
GFR calc Af Amer: 64 mL/min — ABNORMAL LOW (ref >90)
GFR calc non Af Amer: 55 mL/min — ABNORMAL LOW (ref >90)
Glucose, Bld: 92 mg/dL (ref 70–99)
Potassium: 3.6 mEq/L (ref 3.5–5.1)
Sodium: 130 mEq/L — ABNORMAL LOW (ref 135–145)
Total Bilirubin: 0.6 mg/dL (ref 0.3–1.2)
Total Protein: 5.9 g/dL — ABNORMAL LOW (ref 6.0–8.3)

## 2013-06-04 LAB — HEMOGLOBIN AND HEMATOCRIT, BLOOD
HCT: 24.4 % — ABNORMAL LOW (ref 39.0–52.0)
Hemoglobin: 9 g/dL — ABNORMAL LOW (ref 13.0–17.0)

## 2013-06-04 LAB — APTT: aPTT: 45 seconds — ABNORMAL HIGH (ref 24–37)

## 2013-06-04 LAB — CREATININE, SERUM
Creatinine, Ser: 0.81 mg/dL (ref 0.50–1.35)
GFR calc Af Amer: 90 mL/min — ABNORMAL LOW (ref >90)
GFR calc non Af Amer: 78 mL/min — ABNORMAL LOW (ref >90)

## 2013-06-04 LAB — HEMOGLOBIN A1C
Hgb A1c MFr Bld: 5.3 % (ref <5.7)
Mean Plasma Glucose: 105 mg/dL (ref <117)

## 2013-06-04 LAB — MAGNESIUM: Magnesium: 2.9 mg/dL — ABNORMAL HIGH (ref 1.5–2.5)

## 2013-06-04 LAB — SURGICAL PCR SCREEN
MRSA, PCR: NEGATIVE
Staphylococcus aureus: POSITIVE — AB

## 2013-06-04 LAB — PLATELET COUNT: Platelets: 108 10*3/uL — ABNORMAL LOW (ref 150–400)

## 2013-06-04 SURGERY — REPLACEMENT, AORTIC VALVE, OPEN
Anesthesia: General | Site: Chest | Laterality: Right | Wound class: Clean

## 2013-06-04 SURGERY — REPLACEMENT, AORTIC VALVE, OPEN
Anesthesia: General | Site: Chest

## 2013-06-04 MED ORDER — SODIUM CHLORIDE 0.9 % IJ SOLN
OROMUCOSAL | Status: DC | PRN
  Administered 2013-06-04 (×3): via TOPICAL

## 2013-06-04 MED ORDER — BISACODYL 5 MG PO TBEC
10.0000 mg | DELAYED_RELEASE_TABLET | Freq: Every day | ORAL | Status: DC
  Administered 2013-06-05 – 2013-06-06 (×2): 10 mg via ORAL
  Filled 2013-06-04 (×2): qty 2

## 2013-06-04 MED ORDER — HEPARIN SODIUM (PORCINE) 1000 UNIT/ML IJ SOLN
INTRAMUSCULAR | Status: DC | PRN
  Administered 2013-06-04: 23000 [IU] via INTRAVENOUS

## 2013-06-04 MED ORDER — PANTOPRAZOLE SODIUM 40 MG PO TBEC
40.0000 mg | DELAYED_RELEASE_TABLET | Freq: Every day | ORAL | Status: DC
  Administered 2013-06-06: 40 mg via ORAL
  Filled 2013-06-04: qty 1

## 2013-06-04 MED ORDER — MAGNESIUM SULFATE 40 MG/ML IJ SOLN
4.0000 g | Freq: Once | INTRAMUSCULAR | Status: AC
  Administered 2013-06-04: 4 g via INTRAVENOUS
  Filled 2013-06-04: qty 100

## 2013-06-04 MED ORDER — ALBUMIN HUMAN 5 % IV SOLN
250.0000 mL | INTRAVENOUS | Status: AC | PRN
  Administered 2013-06-04 (×4): 250 mL via INTRAVENOUS
  Filled 2013-06-04: qty 500

## 2013-06-04 MED ORDER — PROTAMINE SULFATE 10 MG/ML IV SOLN
INTRAVENOUS | Status: DC | PRN
  Administered 2013-06-04 (×2): 50 mg via INTRAVENOUS
  Administered 2013-06-04: 10 mg via INTRAVENOUS
  Administered 2013-06-04: 60 mg via INTRAVENOUS
  Administered 2013-06-04: 30 mg via INTRAVENOUS

## 2013-06-04 MED ORDER — SODIUM CHLORIDE 0.9 % IR SOLN
Status: DC | PRN
  Administered 2013-06-04: 1000 mL

## 2013-06-04 MED ORDER — ALBUMIN HUMAN 5 % IV SOLN
INTRAVENOUS | Status: DC | PRN
  Administered 2013-06-04: 12:00:00 via INTRAVENOUS

## 2013-06-04 MED ORDER — MORPHINE SULFATE 2 MG/ML IJ SOLN
2.0000 mg | INTRAMUSCULAR | Status: DC | PRN
  Administered 2013-06-05 (×2): 2 mg via INTRAVENOUS
  Filled 2013-06-04 (×2): qty 1

## 2013-06-04 MED ORDER — METOPROLOL TARTRATE 12.5 MG HALF TABLET
12.5000 mg | ORAL_TABLET | Freq: Two times a day (BID) | ORAL | Status: DC
  Administered 2013-06-06: 12.5 mg via ORAL
  Filled 2013-06-04 (×7): qty 1

## 2013-06-04 MED ORDER — VANCOMYCIN HCL IN DEXTROSE 1-5 GM/200ML-% IV SOLN
1000.0000 mg | Freq: Once | INTRAVENOUS | Status: AC
  Administered 2013-06-04: 1000 mg via INTRAVENOUS
  Filled 2013-06-04: qty 200

## 2013-06-04 MED ORDER — GLYCOPYRROLATE 0.2 MG/ML IJ SOLN
INTRAMUSCULAR | Status: DC | PRN
  Administered 2013-06-04: 0.2 mg via INTRAVENOUS

## 2013-06-04 MED ORDER — SODIUM CHLORIDE 0.45 % IV SOLN
INTRAVENOUS | Status: DC
  Administered 2013-06-04: 20 mL/h via INTRAVENOUS

## 2013-06-04 MED ORDER — ASPIRIN EC 325 MG PO TBEC
325.0000 mg | DELAYED_RELEASE_TABLET | Freq: Every day | ORAL | Status: DC
  Administered 2013-06-05 – 2013-06-15 (×10): 325 mg via ORAL
  Filled 2013-06-04 (×11): qty 1

## 2013-06-04 MED ORDER — HYDROMORPHONE HCL PF 1 MG/ML IJ SOLN: 0.2500 mg | INTRAMUSCULAR | Status: DC | PRN

## 2013-06-04 MED ORDER — SODIUM CHLORIDE 0.9 % IV SOLN
INTRAVENOUS | Status: DC
  Administered 2013-06-04: 20 mL/h via INTRAVENOUS

## 2013-06-04 MED ORDER — CEFAZOLIN SODIUM 1-5 GM-% IV SOLN
INTRAVENOUS | Status: AC
  Filled 2013-06-04: qty 50

## 2013-06-04 MED ORDER — ONDANSETRON HCL 4 MG/2ML IJ SOLN: 4.0000 mg | Freq: Four times a day (QID) | INTRAMUSCULAR | Status: DC | PRN

## 2013-06-04 MED ORDER — ACETAMINOPHEN 500 MG PO TABS
1000.0000 mg | ORAL_TABLET | Freq: Four times a day (QID) | ORAL | Status: DC
  Administered 2013-06-05 – 2013-06-06 (×7): 1000 mg via ORAL
  Filled 2013-06-04 (×14): qty 2

## 2013-06-04 MED ORDER — DEXMEDETOMIDINE HCL IN NACL 200 MCG/50ML IV SOLN
0.1000 ug/kg/h | INTRAVENOUS | Status: DC
  Administered 2013-06-04: 0.5 ug/kg/h via INTRAVENOUS
  Filled 2013-06-04: qty 50

## 2013-06-04 MED ORDER — 0.9 % SODIUM CHLORIDE (POUR BTL) OPTIME
TOPICAL | Status: DC | PRN
  Administered 2013-06-04: 6000 mL

## 2013-06-04 MED ORDER — FAMOTIDINE IN NACL 20-0.9 MG/50ML-% IV SOLN
20.0000 mg | Freq: Two times a day (BID) | INTRAVENOUS | Status: AC
  Administered 2013-06-04 (×2): 20 mg via INTRAVENOUS
  Filled 2013-06-04: qty 50

## 2013-06-04 MED ORDER — PHENYLEPHRINE HCL 10 MG/ML IJ SOLN
0.0000 ug/min | INTRAVENOUS | Status: DC
  Filled 2013-06-04 (×2): qty 2

## 2013-06-04 MED ORDER — LACTATED RINGERS IV SOLN
INTRAVENOUS | Status: DC
  Administered 2013-06-04: 20 mL/h via INTRAVENOUS

## 2013-06-04 MED ORDER — MUPIROCIN 2 % EX OINT
1.0000 "application " | TOPICAL_OINTMENT | Freq: Two times a day (BID) | CUTANEOUS | Status: DC
  Administered 2013-06-04 – 2013-06-06 (×5): 1 via NASAL
  Filled 2013-06-04 (×2): qty 22

## 2013-06-04 MED ORDER — METOCLOPRAMIDE HCL 5 MG/ML IJ SOLN
10.0000 mg | Freq: Four times a day (QID) | INTRAMUSCULAR | Status: DC
  Filled 2013-06-04 (×4): qty 2

## 2013-06-04 MED ORDER — CHLORHEXIDINE GLUCONATE CLOTH 2 % EX PADS
6.0000 | MEDICATED_PAD | Freq: Every day | CUTANEOUS | Status: DC
  Administered 2013-06-04 – 2013-06-06 (×3): 6 via TOPICAL

## 2013-06-04 MED ORDER — NITROGLYCERIN IN D5W 200-5 MCG/ML-% IV SOLN: 0.0000 ug/min | INTRAVENOUS | Status: DC

## 2013-06-04 MED ORDER — OXYCODONE HCL 5 MG PO TABS: 5.0000 mg | ORAL_TABLET | ORAL | Status: DC | PRN

## 2013-06-04 MED ORDER — ASPIRIN 81 MG PO CHEW
324.0000 mg | CHEWABLE_TABLET | Freq: Every day | ORAL | Status: DC
  Administered 2013-06-06: 324 mg
  Filled 2013-06-04: qty 1

## 2013-06-04 MED ORDER — BISACODYL 10 MG RE SUPP: 10.0000 mg | Freq: Every day | RECTAL | Status: DC

## 2013-06-04 MED ORDER — ROCURONIUM BROMIDE 100 MG/10ML IV SOLN
INTRAVENOUS | Status: DC | PRN
  Administered 2013-06-04 (×2): 25 mg via INTRAVENOUS
  Administered 2013-06-04: 100 mg via INTRAVENOUS

## 2013-06-04 MED ORDER — PROPOFOL 10 MG/ML IV BOLUS
INTRAVENOUS | Status: DC | PRN
  Administered 2013-06-04: 100 mg via INTRAVENOUS

## 2013-06-04 MED ORDER — METOPROLOL TARTRATE 1 MG/ML IV SOLN: 2.5000 mg | INTRAVENOUS | Status: DC | PRN

## 2013-06-04 MED ORDER — DEXTROSE 5 % IV SOLN
1.5000 g | Freq: Two times a day (BID) | INTRAVENOUS | Status: AC
  Administered 2013-06-04 – 2013-06-06 (×4): 1.5 g via INTRAVENOUS
  Filled 2013-06-04 (×4): qty 1.5

## 2013-06-04 MED ORDER — DOXAZOSIN MESYLATE 4 MG PO TABS
4.0000 mg | ORAL_TABLET | Freq: Every day | ORAL | Status: DC
  Administered 2013-06-05 – 2013-06-14 (×10): 4 mg via ORAL
  Filled 2013-06-04 (×11): qty 1

## 2013-06-04 MED ORDER — METOPROLOL TARTRATE 25 MG/10 ML ORAL SUSPENSION
12.5000 mg | Freq: Two times a day (BID) | ORAL | Status: DC
  Filled 2013-06-04 (×7): qty 5

## 2013-06-04 MED ORDER — SODIUM CHLORIDE 0.9 % IJ SOLN
3.0000 mL | Freq: Two times a day (BID) | INTRAMUSCULAR | Status: DC
  Administered 2013-06-05 – 2013-06-06 (×4): 3 mL via INTRAVENOUS

## 2013-06-04 MED ORDER — PHENYLEPHRINE HCL 10 MG/ML IJ SOLN
INTRAMUSCULAR | Status: DC | PRN
  Administered 2013-06-04: 120 ug via INTRAVENOUS

## 2013-06-04 MED ORDER — DOCUSATE SODIUM 100 MG PO CAPS
200.0000 mg | ORAL_CAPSULE | Freq: Every day | ORAL | Status: DC
  Administered 2013-06-05 – 2013-06-06 (×2): 200 mg via ORAL
  Filled 2013-06-04 (×2): qty 2

## 2013-06-04 MED ORDER — MIDAZOLAM HCL 2 MG/2ML IJ SOLN: 2.0000 mg | INTRAMUSCULAR | Status: DC | PRN

## 2013-06-04 MED ORDER — EPHEDRINE SULFATE 50 MG/ML IJ SOLN
INTRAMUSCULAR | Status: DC | PRN
  Administered 2013-06-04: 20 mg via INTRAVENOUS

## 2013-06-04 MED ORDER — VECURONIUM BROMIDE 10 MG IV SOLR
INTRAVENOUS | Status: DC | PRN
  Administered 2013-06-04: 4 mg via INTRAVENOUS
  Administered 2013-06-04: 5 mg via INTRAVENOUS

## 2013-06-04 MED ORDER — FENTANYL CITRATE 0.05 MG/ML IJ SOLN
INTRAMUSCULAR | Status: DC | PRN
  Administered 2013-06-04: 500 ug via INTRAVENOUS
  Administered 2013-06-04: 250 ug via INTRAVENOUS
  Administered 2013-06-04: 500 ug via INTRAVENOUS
  Administered 2013-06-04: 50 ug via INTRAVENOUS

## 2013-06-04 MED ORDER — POTASSIUM CHLORIDE 10 MEQ/50ML IV SOLN
10.0000 meq | INTRAVENOUS | Status: AC
Start: 2013-06-04 — End: 2013-06-04
  Administered 2013-06-04 (×3): 10 meq via INTRAVENOUS

## 2013-06-04 MED ORDER — MIDAZOLAM HCL 5 MG/5ML IJ SOLN
INTRAMUSCULAR | Status: DC | PRN
  Administered 2013-06-04: 3 mg via INTRAVENOUS
  Administered 2013-06-04: 2 mg via INTRAVENOUS
  Administered 2013-06-04 (×2): 1 mg via INTRAVENOUS
  Administered 2013-06-04: 2 mg via INTRAVENOUS

## 2013-06-04 MED ORDER — ACETAMINOPHEN 650 MG RE SUPP: 650.0000 mg | Freq: Once | RECTAL | Status: AC

## 2013-06-04 MED ORDER — DARIFENACIN HYDROBROMIDE ER 7.5 MG PO TB24
7.5000 mg | ORAL_TABLET | Freq: Every day | ORAL | Status: DC
  Administered 2013-06-05 – 2013-06-15 (×11): 7.5 mg via ORAL
  Filled 2013-06-04 (×11): qty 1

## 2013-06-04 MED ORDER — SODIUM CHLORIDE 0.9 % IV SOLN: 20.0000 mg | INTRAVENOUS | Status: DC | PRN

## 2013-06-04 MED ORDER — CALCIUM CHLORIDE 10 % IV SOLN
INTRAVENOUS | Status: DC | PRN
  Administered 2013-06-04 (×2): 200 mg via INTRAVENOUS

## 2013-06-04 MED ORDER — INSULIN REGULAR BOLUS VIA INFUSION
0.0000 [IU] | Freq: Three times a day (TID) | INTRAVENOUS | Status: DC
  Filled 2013-06-04: qty 10

## 2013-06-04 MED ORDER — CEFAZOLIN SODIUM-DEXTROSE 2-3 GM-% IV SOLR
2.0000 g | Freq: Once | INTRAVENOUS | Status: DC
  Filled 2013-06-04: qty 50

## 2013-06-04 MED ORDER — DEXTROSE 5 % IV SOLN
1.5000 g | INTRAVENOUS | Status: DC | PRN
  Administered 2013-06-04: .75 g via INTRAVENOUS

## 2013-06-04 MED ORDER — SODIUM CHLORIDE 0.9 % IJ SOLN: 3.0000 mL | INTRAMUSCULAR | Status: DC | PRN

## 2013-06-04 MED ORDER — SODIUM CHLORIDE 0.9 % IV SOLN: 250.0000 mL | INTRAVENOUS | Status: DC

## 2013-06-04 MED ORDER — ONDANSETRON HCL 4 MG/2ML IJ SOLN: 4.0000 mg | Freq: Once | INTRAMUSCULAR | Status: DC | PRN

## 2013-06-04 MED ORDER — METOCLOPRAMIDE HCL 5 MG/ML IJ SOLN
10.0000 mg | Freq: Four times a day (QID) | INTRAMUSCULAR | Status: AC
  Administered 2013-06-05 (×4): 10 mg via INTRAVENOUS
  Filled 2013-06-04 (×4): qty 2

## 2013-06-04 MED ORDER — DOPAMINE-DEXTROSE 3.2-5 MG/ML-% IV SOLN
INTRAVENOUS | Status: AC
  Filled 2013-06-04: qty 250

## 2013-06-04 MED ORDER — ACETAMINOPHEN 160 MG/5ML PO SOLN: 1000.0000 mg | Freq: Four times a day (QID) | ORAL | Status: DC

## 2013-06-04 MED ORDER — ARTIFICIAL TEARS OP OINT
TOPICAL_OINTMENT | OPHTHALMIC | Status: DC | PRN
  Administered 2013-06-04: 1 via OPHTHALMIC

## 2013-06-04 MED ORDER — SODIUM CHLORIDE 0.9 % IV SOLN
INTRAVENOUS | Status: DC
  Administered 2013-06-04: 0.8 [IU]/h via INTRAVENOUS
  Filled 2013-06-04: qty 1

## 2013-06-04 MED ORDER — HEMOSTATIC AGENTS (NO CHARGE) OPTIME
TOPICAL | Status: DC | PRN
  Administered 2013-06-04: 1 via TOPICAL

## 2013-06-04 MED ORDER — ACETAMINOPHEN 160 MG/5ML PO SOLN
650.0000 mg | Freq: Once | ORAL | Status: AC
  Administered 2013-06-04: 650 mg
  Filled 2013-06-04: qty 20.3

## 2013-06-04 MED ORDER — LACTATED RINGERS IV SOLN
INTRAVENOUS | Status: DC | PRN
  Administered 2013-06-04 (×5): via INTRAVENOUS

## 2013-06-04 MED ORDER — DOPAMINE-DEXTROSE 3.2-5 MG/ML-% IV SOLN
2.0000 ug/kg/min | INTRAVENOUS | Status: DC
  Administered 2013-06-04: 3 ug/kg/min via INTRAVENOUS

## 2013-06-04 MED ORDER — MORPHINE SULFATE 2 MG/ML IJ SOLN
1.0000 mg | INTRAMUSCULAR | Status: AC | PRN
  Administered 2013-06-04 (×2): 2 mg via INTRAVENOUS
  Filled 2013-06-04 (×2): qty 1

## 2013-06-04 MED ORDER — CEFAZOLIN SODIUM 1-5 GM-% IV SOLN
INTRAVENOUS | Status: DC | PRN
  Administered 2013-06-04: .5 g via INTRAVENOUS

## 2013-06-04 MED ORDER — LACTATED RINGERS IV SOLN: 500.0000 mL | Freq: Once | INTRAVENOUS | Status: AC | PRN

## 2013-06-04 SURGICAL SUPPLY — 90 items
ADAPTER CARDIO PERF ANTE/RETRO (ADAPTER) ×3 IMPLANT
APPLICATOR COTTON TIP 6IN STRL (MISCELLANEOUS) IMPLANT
ATTRACTOMAT 16X20 MAGNETIC DRP (DRAPES) ×3 IMPLANT
BAG DECANTER FOR FLEXI CONT (MISCELLANEOUS) ×3 IMPLANT
BANDAGE ELASTIC 4 VELCRO ST LF (GAUZE/BANDAGES/DRESSINGS) ×3 IMPLANT
BANDAGE ELASTIC 6 VELCRO ST LF (GAUZE/BANDAGES/DRESSINGS) ×3 IMPLANT
BANDAGE GAUZE ELAST BULKY 4 IN (GAUZE/BANDAGES/DRESSINGS) ×3 IMPLANT
BLADE STERNUM SYSTEM 6 (BLADE) ×3 IMPLANT
BLADE SURG 11 STRL SS (BLADE) ×3 IMPLANT
BLADE SURG 15 STRL LF DISP TIS (BLADE) ×2 IMPLANT
BLADE SURG 15 STRL SS (BLADE) ×1
BOOT SUTURE AID YELLOW STND (SUTURE) IMPLANT
CANISTER SUCTION 2500CC (MISCELLANEOUS) ×3 IMPLANT
CANNULA AORTIC HI-FLOW 6.5M20F (CANNULA) ×3 IMPLANT
CANNULA GUNDRY RCSP 15FR (MISCELLANEOUS) ×3 IMPLANT
CANNULA VENOUS DUAL 32/40 (CANNULA) IMPLANT
CATH CPB KIT GERHARDT (MISCELLANEOUS) ×3 IMPLANT
CATH HEART VENT LEFT (CATHETERS) ×2 IMPLANT
CATH RETROPLEGIA CORONARY 14FR (CATHETERS) ×3 IMPLANT
CATH ROBINSON RED A/P 18FR (CATHETERS) ×3 IMPLANT
CATH THORACIC 28FR (CATHETERS) IMPLANT
CATH/SQUID NICHOLS JEHLE COR (CATHETERS) IMPLANT
CLOTH BEACON ORANGE TIMEOUT ST (SAFETY) ×3 IMPLANT
COVER SURGICAL LIGHT HANDLE (MISCELLANEOUS) ×3 IMPLANT
CRADLE DONUT ADULT HEAD (MISCELLANEOUS) ×3 IMPLANT
DERMABOND ADVANCED (GAUZE/BANDAGES/DRESSINGS) ×1
DERMABOND ADVANCED .7 DNX12 (GAUZE/BANDAGES/DRESSINGS) ×2 IMPLANT
DRAIN CHANNEL 28F RND 3/8 FF (WOUND CARE) ×6 IMPLANT
DRAIN CHANNEL 32F RND 10.7 FF (WOUND CARE) IMPLANT
DRAPE CARDIOVASCULAR INCISE (DRAPES) ×1
DRAPE SLUSH/WARMER DISC (DRAPES) ×3 IMPLANT
DRAPE SRG 135X102X78XABS (DRAPES) ×2 IMPLANT
DRSG COVADERM 4X14 (GAUZE/BANDAGES/DRESSINGS) ×3 IMPLANT
ELECT CAUTERY BLADE 6.4 (BLADE) ×3 IMPLANT
ELECT REM PT RETURN 9FT ADLT (ELECTROSURGICAL) ×6
ELECTRODE REM PT RTRN 9FT ADLT (ELECTROSURGICAL) ×4 IMPLANT
GLOVE BIO SURGEON STRL SZ 6.5 (GLOVE) ×27 IMPLANT
GLOVE BIO SURGEON STRL SZ8 (GLOVE) ×3 IMPLANT
GLOVE BIOGEL PI IND STRL 6.5 (GLOVE) ×4 IMPLANT
GLOVE BIOGEL PI IND STRL 7.0 (GLOVE) ×2 IMPLANT
GLOVE BIOGEL PI INDICATOR 6.5 (GLOVE) ×2
GLOVE BIOGEL PI INDICATOR 7.0 (GLOVE) ×1
GOWN STRL NON-REIN LRG LVL3 (GOWN DISPOSABLE) ×30 IMPLANT
HANDLE STAPLE ENDO GIA SHORT (STAPLE) ×1
HEMOSTAT POWDER SURGIFOAM 1G (HEMOSTASIS) ×9 IMPLANT
HEMOSTAT SURGICEL 2X14 (HEMOSTASIS) ×3 IMPLANT
INSERT FOGARTY XLG (MISCELLANEOUS) ×6 IMPLANT
KIT BASIN OR (CUSTOM PROCEDURE TRAY) ×3 IMPLANT
KIT ROOM TURNOVER OR (KITS) ×3 IMPLANT
KIT SUCTION CATH 14FR (SUCTIONS) ×6 IMPLANT
LEAD PACING MYOCARDI (MISCELLANEOUS) IMPLANT
LINE VENT (MISCELLANEOUS) ×3 IMPLANT
NEEDLE 18GX1X1/2 (RX/OR ONLY) (NEEDLE) IMPLANT
NS IRRIG 1000ML POUR BTL (IV SOLUTION) ×24 IMPLANT
PACK OPEN HEART (CUSTOM PROCEDURE TRAY) ×3 IMPLANT
PAD ARMBOARD 7.5X6 YLW CONV (MISCELLANEOUS) ×6 IMPLANT
PUNCH AORTIC ROTATE 4.0MM (MISCELLANEOUS) ×3 IMPLANT
RELOAD EGIA 45 MED/THCK PURPLE (STAPLE) ×3 IMPLANT
SET CARDIOPLEGIA MPS 5001102 (MISCELLANEOUS) ×3 IMPLANT
SPONGE GAUZE 4X4 12PLY (GAUZE/BANDAGES/DRESSINGS) ×12 IMPLANT
SPONGE LAP 18X18 X RAY DECT (DISPOSABLE) ×9 IMPLANT
STAPLER ENDO GIA 12MM SHORT (STAPLE) ×2 IMPLANT
SUT BONE WAX W31G (SUTURE) IMPLANT
SUT ETHIBON 2 0 V 52N 30 (SUTURE) ×6 IMPLANT
SUT MNCRL AB 4-0 PS2 18 (SUTURE) ×3 IMPLANT
SUT PROLENE 3 0 RB 1 (SUTURE) IMPLANT
SUT PROLENE 3 0 SH 1 (SUTURE) ×3 IMPLANT
SUT PROLENE 3 0 SH1 36 (SUTURE) ×3 IMPLANT
SUT PROLENE 4 0 RB 1 (SUTURE) ×2
SUT PROLENE 4 0 TF (SUTURE) ×3 IMPLANT
SUT PROLENE 4-0 RB1 .5 CRCL 36 (SUTURE) ×4 IMPLANT
SUT PROLENE 6 0 CC (SUTURE) ×3 IMPLANT
SUT SILK  1 MH (SUTURE) ×1
SUT SILK 1 MH (SUTURE) ×2 IMPLANT
SUT STEEL 6MS V (SUTURE) ×3 IMPLANT
SUT STEEL SZ 6 DBL 3X14 BALL (SUTURE) ×3 IMPLANT
SUT VIC AB 1 CTX 18 (SUTURE) ×6 IMPLANT
SUT VIC AB 2-0 CT1 27 (SUTURE) ×1
SUT VIC AB 2-0 CT1 TAPERPNT 27 (SUTURE) ×2 IMPLANT
SUTURE E-PAK OPEN HEART (SUTURE) ×3 IMPLANT
SYSTEM SAHARA CHEST DRAIN ATS (WOUND CARE) ×3 IMPLANT
TAPE CLOTH SURG 4X10 WHT LF (GAUZE/BANDAGES/DRESSINGS) ×6 IMPLANT
TOWEL OR 17X24 6PK STRL BLUE (TOWEL DISPOSABLE) ×6 IMPLANT
TOWEL OR 17X26 10 PK STRL BLUE (TOWEL DISPOSABLE) ×6 IMPLANT
TRAY FOLEY IC TEMP SENS 14FR (CATHETERS) ×3 IMPLANT
TUBE SUCT INTRACARD DLP 20F (MISCELLANEOUS) IMPLANT
UNDERPAD 30X30 INCONTINENT (UNDERPADS AND DIAPERS) ×3 IMPLANT
VALVE MAGNA EASE AORTIC 25MM (Prosthesis & Implant Heart) ×3 IMPLANT
VENT LEFT HEART 12002 (CATHETERS) ×3
WATER STERILE IRR 1000ML POUR (IV SOLUTION) ×6 IMPLANT

## 2013-06-04 NOTE — Clinical Social Work Psychosocial (Signed)
Clinical Social Work Department BRIEF PSYCHOSOCIAL ASSESSMENT 06/04/2013  Patient:  JAIDIN, RICHISON     Account Number:  [id number]     Admit date:  05/31/2013  Clinical Social Worker:  Madaline Guthrie  Date/Time:  06/04/2013 04:00 PM  Referred by:  Care Management  Date Referred:  06/04/2013 Referred for  SNF Placement   Other Referral:   Interview type:  Other - See comment Other interview type:   CM    PSYCHOSOCIAL DATA Living Status:  FAMILY Admitted from facility:   Level of care:   Primary support name:  Jahking Lesser [phone] Primary support relationship to patient:  SPOUSE Degree of support available:   good    CURRENT CONCERNS Current Concerns  Post-Acute Placement   Other Concerns:    SOCIAL WORK ASSESSMENT / PLAN CSW received consult for SNF placement.  CM talked with family and they expressed interest in [facility] CSW was unable to reach family at this time but completed FL2 and Pasarr.  CSW will continue to follow and assist with discharge planning.   Assessment/plan status:  Psychosocial Support/Ongoing Assessment of Needs Other assessment/ plan:   Information/referral to community resources:    PATIENT'S/FAMILY'S RESPONSE TO PLAN OF CARE:

## 2013-06-04 NOTE — Progress Notes (Signed)
Patient ID: Joseph Garner, male   DOB: 1924/01/14, 79 y.o.   MRN: [id number] EVENING ROUNDS NOTE :     [address]       [name] [address]             [phone]                 Day of Surgery Procedure(s) (LRB): AORTIC VALVE REPLACEMENT (AVR) (N/A) INTRAOPERATIVE TRANSESOPHAGEAL ECHOCARDIOGRAM (N/A) CORONARY ARTERY BYPASS GRAFTING (CABG) (N/A) WEDGE RESECTION Right Lower Lobe (Right)  Total Length of Stay:  LOS: 4 days  BP 96/70  Pulse 91  Temp(Src) 98.6 F (37 C) (Oral)  Resp 20  Ht 5\' 8"  (1.727 m)  Wt 154 lb 6.4 oz (70.035 kg)  BMI 23.48 kg/m2  SpO2 100%  .Intake/Output     08/08 0701 - 08/09 0700   P.O.    I.V. (mL/kg) 2720 (38.8)   Blood 440   NG/GT 60   IV Piggyback 800   Total Intake(mL/kg) 4020 (57.4)   Urine (mL/kg/hr) 1660 (1.9)   Emesis/NG output 100 (0.1)   Blood 850 (1)   Chest Tube 330 (0.4)   Total Output 2940   Net +1080         . sodium chloride 20 mL/hr (06/04/13 1330)  . sodium chloride 20 mL/hr at 06/04/13 1700  . [START ON 06/05/2013] sodium chloride    . dexmedetomidine 0.3 mcg/kg/hr (06/04/13 1800)  . insulin (NOVOLIN-R) infusion    . lactated ringers    . nitroGLYCERIN Stopped (06/04/13 1430)  . phenylephrine (NEO-SYNEPHRINE) Adult infusion 20 mcg/min (06/04/13 1700)     Lab Results  Component Value Date   WBC 16.9* 06/04/2013   HGB 10.1* 06/04/2013   HCT 27.3* 06/04/2013   PLT 105* 06/04/2013   GLUCOSE 106* 06/04/2013   CHOL 120 06/04/2013   TRIG 51 06/04/2013   HDL 50 06/04/2013   LDLCALC 60 06/04/2013   ALT 10 06/04/2013   AST 13 06/04/2013   NA 135 06/04/2013   K 3.8 06/04/2013   CL 95* 06/04/2013   CREATININE 1.15 06/04/2013   BUN 16 06/04/2013   CO2 27 06/04/2013   TSH 1.697 05/31/2013   INR 1.55* 06/04/2013   HGBA1C 5.3 06/04/2013   Neuro intact but sleepy  Still on vent  Not bleeding  Start dopamine for decreased CI   Delight Ovens MD  Beeper [phone] Office [phone] 06/04/2013 7:26 PM

## 2013-06-04 NOTE — Progress Notes (Signed)
  Echocardiogram Echocardiogram Transesophageal has been performed.  Georgian Co 06/04/2013, 8:47 AM

## 2013-06-04 NOTE — Brief Op Note (Addendum)
      [address]       [name] [address]             [phone]        05/31/2013 - 06/04/2013  11:30 AM  PATIENT:  Joseph Garner  79 y.o. male  PRE-OPERATIVE DIAGNOSIS:  1.Severe Aortic Stenosis 2.Single vessel CAD 3. RML lung nodule  POST-OPERATIVE DIAGNOSIS:  1.Severe Aortic Stenosis 2.Single vessel CAD 3. RML lung nodule  PROCEDURE:  INTRAOPERATIVE TRANSESOPHAGEAL ECHOCARDIOGRAM, MEDIAN STERNOTOMY for CABG x 1 (SVG to DIAGONAL) with EVH from left thigh, AORTIC VALVE REPLACEMENT (AVR)  (using a Pericardial Tissue Valve, size 25 mm), and removal RML lung nodule  SURGEON:  Surgeon(s) and Role:    * Delight Ovens, MD - Primary  PHYSICIAN ASSISTANT: Doree Fudge PA-C   ANESTHESIA:   general  EBL:  Total I/O In: -  Out: 650 [Urine:650]  DRAINS: Chest Tube(s) in the Mediastinal and pleural spaces   SPECIMEN:  Source of Specimen:  RML lung nodule  DISPOSITION OF SPECIMEN:  PATHOLOGY  COUNTS CORRECT:  YES  DICTATION: .Dragon Dictation  PLAN OF CARE: Admit to inpatient   PATIENT DISPOSITION:  ICU - intubated and hemodynamically stable.   Delay start of Pharmacological VTE agent (>24hrs) due to surgical blood loss or risk of bleeding: yes  PRE OP WEIGHT: 70 kg       Delight Ovens MD     06/04/2013 12:59 PM

## 2013-06-04 NOTE — Anesthesia Preprocedure Evaluation (Signed)
Anesthesia Evaluation  Patient identified by MRN, date of birth, ID band Patient awake    Reviewed: Allergy & Precautions, H&P , NPO status , Patient's Chart, lab work & pertinent test results  Airway Mallampati: I TM Distance: >3 FB Neck ROM: full    Dental   Pulmonary          Cardiovascular hypertension, +CHF + dysrhythmias + Valvular Problems/Murmurs AS Rhythm:regular Rate:Normal     Neuro/Psych Depression    GI/Hepatic GERD-  ,  Endo/Other    Renal/GU      Musculoskeletal   Abdominal   Peds  Hematology  (+) anemia ,   Anesthesia Other Findings   Reproductive/Obstetrics                           Anesthesia Physical Anesthesia Plan  ASA: IV  Anesthesia Plan: General   Post-op Pain Management:    Induction: Intravenous  Airway Management Planned: Oral ETT  Additional Equipment: Arterial line, CVP, PA Cath and TEE  Intra-op Plan:   Post-operative Plan: Post-operative intubation/ventilation  Informed Consent: I have reviewed the patients History and Physical, chart, labs and discussed the procedure including the risks, benefits and alternatives for the proposed anesthesia with the patient or authorized representative who has indicated his/her understanding and acceptance.     Plan Discussed with: CRNA, Anesthesiologist and Surgeon  Anesthesia Plan Comments:         Anesthesia Quick Evaluation

## 2013-06-04 NOTE — Transfer of Care (Signed)
Immediate Anesthesia Transfer of Care Note  Patient: Joseph Garner  Procedure(s) Performed: Procedure(s): AORTIC VALVE REPLACEMENT (AVR) (N/A) INTRAOPERATIVE TRANSESOPHAGEAL ECHOCARDIOGRAM (N/A) CORONARY ARTERY BYPASS GRAFTING (CABG) (N/A) WEDGE RESECTION Right Lower Lobe (Right)  Patient Location: SICU  Anesthesia Type:General  Level of Consciousness: sedated and unresponsive  Airway & Oxygen Therapy: Patient remains intubated per anesthesia plan and Patient placed on Ventilator (see vital sign flow sheet for setting)  Post-op Assessment: Post -op Vital signs reviewed and stable  Post vital signs: Reviewed and stable  Complications: No apparent anesthesia complications

## 2013-06-04 NOTE — Preoperative (Signed)
Beta Blockers   Reason not to administer Beta Blockers:Hold  beta blocker due to Bradycardia (HR less than 50 bpm) 

## 2013-06-04 NOTE — OR Nursing (Signed)
Rectal probe removed at completion of case by S. Theatre stage manager.

## 2013-06-04 NOTE — Anesthesia Postprocedure Evaluation (Signed)
  Anesthesia Post-op Note  Patient: Joseph Garner  Procedure(s) Performed: Procedure(s): AORTIC VALVE REPLACEMENT (AVR) (N/A) INTRAOPERATIVE TRANSESOPHAGEAL ECHOCARDIOGRAM (N/A) CORONARY ARTERY BYPASS GRAFTING (CABG) (N/A) WEDGE RESECTION Right Lower Lobe (Right)  Patient Location: SICU  Anesthesia Type:General  Level of Consciousness: sedated and Patient remains intubated per anesthesia plan  Airway and Oxygen Therapy: Patient remains intubated per anesthesia plan and Patient placed on Ventilator (see vital sign flow sheet for setting)  Post-op Pain: none  Post-op Assessment: Post-op Vital signs reviewed, Patient's Cardiovascular Status Stable, Respiratory Function Stable, Patent Airway, No signs of Nausea or vomiting and Pain level controlled  Post-op Vital Signs: stable  Complications: No apparent anesthesia complications

## 2013-06-04 NOTE — OR Nursing (Signed)
Rectal Probe inserted by S. Camron Essman RN during patient positioning.

## 2013-06-04 NOTE — Preoperative (Deleted)
Beta Blockers   Reason not to administer Beta Blockers:Not Applicable 

## 2013-06-04 NOTE — Clinical Social Work Placement (Signed)
Clinical Social Work Department CLINICAL SOCIAL WORK PLACEMENT NOTE 06/04/2013  Patient:  Joseph Garner, Joseph Garner  Account Number:  [id number] Admit date:  05/31/2013  Clinical Social Worker:  Salomon Fick, LCSW  Date/time:  06/04/2013 04:10 PM  Clinical Social Work is seeking post-discharge placement for this patient at the following level of care:   SKILLED NURSING   (*CSW will update this form in Epic as items are completed)     Patient/family provided with [facility] Department of Clinical Social Work's list of facilities offering this level of care within the geographic area requested by the patient (or if unable, by the patient's family).    Patient/family informed of their freedom to choose among providers that offer the needed level of care, that participate in Medicare, Medicaid or managed care program needed by the patient, have an available bed and are willing to accept the patient.    Patient/family informed of [facility]' ownership interest in [facility], as well as of the fact that they are under no obligation to receive care at this facility.  PASARR submitted to EDS on 06/04/2013 PASARR number received from EDS on   FL2 transmitted to all facilities in geographic area requested by pt/family on  06/04/2013 FL2 transmitted to all facilities within larger geographic area on   Patient informed that his/her managed care company has contracts with or will negotiate with  certain facilities, including the following:     Patient/family informed of bed offers received:   Patient chooses bed at  Physician recommends and patient chooses bed at    Patient to be transferred to  on   Patient to be transferred to facility by   The following physician request were entered in Epic:   Additional Comments:

## 2013-06-05 ENCOUNTER — Inpatient Hospital Stay (HOSPITAL_COMMUNITY): Payer: Medicare Other

## 2013-06-05 DIAGNOSIS — Z953 Presence of xenogenic heart valve: Secondary | ICD-10-CM

## 2013-06-05 LAB — GLUCOSE, CAPILLARY
Glucose-Capillary: 102 mg/dL — ABNORMAL HIGH (ref 70–99)
Glucose-Capillary: 104 mg/dL — ABNORMAL HIGH (ref 70–99)
Glucose-Capillary: 110 mg/dL — ABNORMAL HIGH (ref 70–99)
Glucose-Capillary: 111 mg/dL — ABNORMAL HIGH (ref 70–99)
Glucose-Capillary: 113 mg/dL — ABNORMAL HIGH (ref 70–99)
Glucose-Capillary: 116 mg/dL — ABNORMAL HIGH (ref 70–99)
Glucose-Capillary: 117 mg/dL — ABNORMAL HIGH (ref 70–99)
Glucose-Capillary: 124 mg/dL — ABNORMAL HIGH (ref 70–99)
Glucose-Capillary: 134 mg/dL — ABNORMAL HIGH (ref 70–99)
Glucose-Capillary: 155 mg/dL — ABNORMAL HIGH (ref 70–99)
Glucose-Capillary: 95 mg/dL (ref 70–99)
Glucose-Capillary: 104 mg/dL — ABNORMAL HIGH (ref 70–99)
Glucose-Capillary: 104 mg/dL — ABNORMAL HIGH (ref 70–99)
Glucose-Capillary: 117 mg/dL — ABNORMAL HIGH (ref 70–99)
Glucose-Capillary: 96 mg/dL (ref 70–99)
Glucose-Capillary: 112 mg/dL — ABNORMAL HIGH (ref 70–99)
Glucose-Capillary: 106 mg/dL — ABNORMAL HIGH (ref 70–99)
Glucose-Capillary: 107 mg/dL — ABNORMAL HIGH (ref 70–99)
Glucose-Capillary: 113 mg/dL — ABNORMAL HIGH (ref 70–99)
Glucose-Capillary: 118 mg/dL — ABNORMAL HIGH (ref 70–99)
Glucose-Capillary: 136 mg/dL — ABNORMAL HIGH (ref 70–99)
Glucose-Capillary: 162 mg/dL — ABNORMAL HIGH (ref 70–99)
Glucose-Capillary: 64 mg/dL — ABNORMAL LOW (ref 70–99)
Glucose-Capillary: 119 mg/dL — ABNORMAL HIGH (ref 70–99)
Glucose-Capillary: 123 mg/dL — ABNORMAL HIGH (ref 70–99)

## 2013-06-05 LAB — BASIC METABOLIC PANEL
BUN: 13 mg/dL (ref 6–23)
Calcium: 8 mg/dL — ABNORMAL LOW (ref 8.4–10.5)
GFR calc Af Amer: 90 mL/min — ABNORMAL LOW (ref >90)
GFR calc non Af Amer: 77 mL/min — ABNORMAL LOW (ref >90)
Potassium: 4.1 mEq/L (ref 3.5–5.1)
Sodium: 133 mEq/L — ABNORMAL LOW (ref 135–145)

## 2013-06-05 LAB — CBC
HCT: 21.8 % — ABNORMAL LOW (ref 39.0–52.0)
Hemoglobin: 8 g/dL — ABNORMAL LOW (ref 13.0–17.0)
MCH: 34.2 pg — ABNORMAL HIGH (ref 26.0–34.0)
MCH: 34.5 pg — ABNORMAL HIGH (ref 26.0–34.0)
MCHC: 36.9 g/dL — ABNORMAL HIGH (ref 30.0–36.0)
MCHC: 36.7 g/dL — ABNORMAL HIGH (ref 30.0–36.0)
MCV: 94 fL (ref 78.0–100.0)
RDW: 13.5 % (ref 11.5–15.5)
RDW: 13.9 % (ref 11.5–15.5)

## 2013-06-05 LAB — POCT I-STAT, CHEM 8
BUN: 17 mg/dL (ref 6–23)
Calcium, Ion: 1.17 mmol/L (ref 1.13–1.30)
Chloride: 99 mEq/L (ref 96–112)
Creatinine, Ser: 1.2 mg/dL (ref 0.50–1.35)
Glucose, Bld: 141 mg/dL — ABNORMAL HIGH (ref 70–99)
HCT: 23 % — ABNORMAL LOW (ref 39.0–52.0)
Hemoglobin: 7.8 g/dL — ABNORMAL LOW (ref 13.0–17.0)
Potassium: 4.3 mEq/L (ref 3.5–5.1)
Sodium: 132 mEq/L — ABNORMAL LOW (ref 135–145)
TCO2: 18 mmol/L (ref 0–100)

## 2013-06-05 LAB — MAGNESIUM: Magnesium: 2.5 mg/dL (ref 1.5–2.5)

## 2013-06-05 LAB — POCT I-STAT 3, ART BLOOD GAS (G3+)
Acid-base deficit: 4 mmol/L — ABNORMAL HIGH (ref 0.0–2.0)
Acid-base deficit: 4 mmol/L — ABNORMAL HIGH (ref 0.0–2.0)
Bicarbonate: 20.9 mEq/L (ref 20.0–24.0)
Bicarbonate: 20.4 mEq/L (ref 20.0–24.0)
O2 Saturation: 98 %
O2 Saturation: 99 %
Patient temperature: 36.7
Patient temperature: 36.9
TCO2: 22 mmol/L (ref 0–100)
TCO2: 22 mmol/L (ref 0–100)
pCO2 arterial: 34.4 mmHg — ABNORMAL LOW (ref 35.0–45.0)
pCO2 arterial: 35.4 mmHg (ref 35.0–45.0)
pH, Arterial: 7.391 (ref 7.350–7.450)
pH, Arterial: 7.369 (ref 7.350–7.450)
pO2, Arterial: 98 mmHg (ref 80.0–100.0)
pO2, Arterial: 119 mmHg — ABNORMAL HIGH (ref 80.0–100.0)

## 2013-06-05 MED ORDER — TRAMADOL HCL 50 MG PO TABS
50.0000 mg | ORAL_TABLET | Freq: Four times a day (QID) | ORAL | Status: DC | PRN
  Administered 2013-06-10: 50 mg via ORAL
  Filled 2013-06-05: qty 1

## 2013-06-05 MED ORDER — DEXTROSE 50 % IV SOLN: 14.0000 mL | Freq: Once | INTRAVENOUS | Status: AC

## 2013-06-05 MED ORDER — DEXTROSE 50 % IV SOLN
INTRAVENOUS | Status: AC
  Administered 2013-06-05: 14 mL via INTRAVENOUS
  Filled 2013-06-05: qty 50

## 2013-06-05 MED ORDER — INSULIN ASPART 100 UNIT/ML ~~LOC~~ SOLN
0.0000 [IU] | SUBCUTANEOUS | Status: DC
  Administered 2013-06-05 – 2013-06-07 (×6): 2 [IU] via SUBCUTANEOUS

## 2013-06-05 MED ORDER — FUROSEMIDE 10 MG/ML IJ SOLN
40.0000 mg | Freq: Once | INTRAMUSCULAR | Status: AC
  Administered 2013-06-05: 40 mg via INTRAVENOUS
  Filled 2013-06-05: qty 4

## 2013-06-05 NOTE — Progress Notes (Signed)
Patient ID: Joseph Garner, male   DOB: March 29, 1924, 79 y.o.   MRN: [id number] TCTS DAILY ICU PROGRESS NOTE                   [address]            [name] [address]          [phone]   1 Day Post-Op Procedure(s) (LRB): AORTIC VALVE REPLACEMENT (AVR) (N/A) INTRAOPERATIVE TRANSESOPHAGEAL ECHOCARDIOGRAM (N/A) CORONARY ARTERY BYPASS GRAFTING (CABG) (N/A) WEDGE RESECTION Right Lower Lobe (Right)  Total Length of Stay:  LOS: 5 days   Subjective: extubated awake and alert , neuro intatc  Objective: Vital signs in last 24 hours: Temp:  [97.3 F (36.3 C)-99 F (37.2 C)] 98.6 F (37 C) (08/09 0800) Pulse Rate:  [79-120] 107 (08/09 0800) Cardiac Rhythm:  [-] Atrial fibrillation (08/09 0000) Resp:  [8-21] 10 (08/09 0800) BP: (92-125)/(50-72) 104/58 mmHg (08/09 0600) SpO2:  [96 %-100 %] 97 % (08/09 0800) Arterial Line BP: (88-149)/(46-70) 98/46 mmHg (08/09 0800) FiO2 (%):  [40 %-50 %] 40 % (08/09 0016) Weight:  [165 lb 2 oz (74.9 kg)] 165 lb 2 oz (74.9 kg) (08/09 0500)  Filed Weights   06/03/13 0509 06/04/13 0459 06/05/13 0500  Weight: 155 lb 3.3 oz (70.4 kg) 154 lb 6.4 oz (70.035 kg) 165 lb 2 oz (74.9 kg)    Weight change: 10 lb 11.6 oz (4.865 kg)   Hemodynamic parameters for last 24 hours: PAP: (14-34)/(5-19) 14/6 mmHg CO:  [2.4 L/min-6 L/min] 4.1 L/min CI:  [1.3 L/min/m2-3.3 L/min/m2] 2.2 L/min/m2  Intake/Output from previous day: 08/08 0701 - 08/09 0700 In: 5655.6 [I.V.:4225.6; Blood:440; NG/GT:90; IV Piggyback:900] Out: 4410 [Urine:2770; Emesis/NG output:100; Blood:850; Chest Tube:690]  Intake/Output this shift: Total I/O In: 75.5 [I.V.:75.5] Out: 20 [Urine:20]  Current Meds: Scheduled Meds: . acetaminophen  1,000 mg Oral Q6H   Or  . acetaminophen (TYLENOL) oral liquid 160 mg/5 mL  1,000 mg Per Tube Q6H  . aspirin EC  325 mg Oral Daily   Or  . aspirin  324 mg Per Tube Daily  . bisacodyl  10 mg Oral Daily   Or  . bisacodyl  10 mg Rectal  Daily  . cefUROXime (ZINACEF)  IV  1.5 g Intravenous Q12H  . Chlorhexidine Gluconate Cloth  6 each Topical Daily  . darifenacin  7.5 mg Oral Daily  . docusate sodium  200 mg Oral Daily  . doxazosin  4 mg Oral QHS  . insulin regular  0-10 Units Intravenous TID WC  . metoCLOPramide (REGLAN) injection  10 mg Intravenous Q6H  . metoprolol tartrate  12.5 mg Oral BID   Or  . metoprolol tartrate  12.5 mg Per Tube BID  . mupirocin ointment  1 application Nasal BID  . [START ON 06/06/2013] pantoprazole  40 mg Oral Daily  . sodium chloride  3 mL Intravenous Q12H   Continuous Infusions: . sodium chloride 20 mL/hr at 06/05/13 0000  . sodium chloride 20 mL/hr at 06/04/13 1700  . sodium chloride    . dexmedetomidine Stopped (06/04/13 1930)  . DOPamine 1.5 mcg/kg/min (06/05/13 0815)  . insulin (NOVOLIN-R) infusion 1.4 Units/hr (06/05/13 0805)  . lactated ringers 20 mL/hr (06/04/13 1345)  . nitroGLYCERIN Stopped (06/04/13 1430)  . phenylephrine (NEO-SYNEPHRINE) Adult infusion 10 mcg/min (06/05/13 0730)   PRN Meds:.metoprolol, midazolam, morphine injection, ondansetron (ZOFRAN) IV, oxyCODONE, sodium chloride  General appearance: alert and cooperative Neurologic: intact Heart: regular rate and rhythm, S1,  S2 normal, no murmur, click, rub or gallop Lungs: diminished breath sounds bibasilar Abdomen: soft, non-tender; bowel sounds normal; no masses,  no organomegaly Extremities: extremities normal, atraumatic, no cyanosis or edema and Homans sign is negative, no sign of DVT Wound: sternum stable  Lab Results: CBC: Recent Labs  06/04/13 2150 06/05/13 0500  WBC 16.0* 15.1*  HGB 8.9* 8.3*  HCT 24.6* 22.5*  PLT 107* 92*   BMET:  Recent Labs  06/04/13 0405  06/04/13 2124 06/04/13 2150 06/05/13 0500  NA 130*  < > 135  --  133*  K 3.6  < > 4.2  --  4.1  CL 95*  --  102  --  104  CO2 27  --   --   --  22  GLUCOSE 92  < > 125*  --  102*  BUN 16  --  12  --  13  CREATININE 1.15  --  0.90  0.81 0.82  CALCIUM 8.6  --   --   --  8.0*  < > = values in this interval not displayed.  PT/INR:  Recent Labs  06/04/13 1345  LABPROT 18.2*  INR 1.55*   Radiology: Dg Chest Portable 1 View In Am  06/05/2013   *RADIOLOGY REPORT*  Clinical Data: Postop.  PORTABLE CHEST - 1 VIEW  Comparison:  Chest x-ray from yesterday.  Findings:   Status post tracheal esophageal extubation.  Swan-Ganz catheter by right IJ approach continues to terminate in the region of the right pulmonary artery. Thoracic drains in similar position.  Unchanged cardiopericardial enlargement. Status post aortic valve replacement and CABG. Persistent retrocardiac opacity.  Haziness at the right base is likely postoperative atelectasis.  No significant pneumothorax.  IMPRESSION:  1.  Unchanged left more than right base opacities, likely postoperative atelectasis. 2.  Remaining support apparatus in unremarkable position, as above.   Original Report Authenticated By: Tiburcio Pea   Dg Chest Portable 1 View  06/04/2013   *RADIOLOGY REPORT*  Clinical Data: Post CABG  PORTABLE CHEST - 1 VIEW  Comparison: Portable exam 1342 hours compared to 06/01/2013  Findings: Tip of endotracheal tube projects 6.0 cm above carina. Nasogastric tube extends into stomach. Right jugular Swan-Ganz catheter tip projects over proximal right pulmonary artery. Enlargement of cardiac silhouette post CABG and AVR. Atherosclerotic calcification aorta. Mediastinal contours and pulmonary vascularity normal. Atelectasis left lower lobe. Minimal linear subsegmental atelectasis at minor fissure. No acute infiltrate or pneumothorax. Bones unremarkable.  IMPRESSION: Postoperative changes as above.   Original Report Authenticated By: Ulyses Southward, M.D.     Assessment/Plan: S/P Procedure(s) (LRB): AORTIC VALVE REPLACEMENT (AVR) (N/A) INTRAOPERATIVE TRANSESOPHAGEAL ECHOCARDIOGRAM (N/A) CORONARY ARTERY BYPASS GRAFTING (CABG) (N/A) WEDGE RESECTION Right Lower Lobe  (Right) Mobilize Diuresis d/c tubes/lines Continue foley due to diuresing patient, patient in ICU and urinary output monitoring See progression orders Expected Acute  Blood - loss Anemia, no transfusion given Thrombocytopenia - avoid heparin Wean off dopamine    Jonnie Kubly B 06/05/2013 9:03 AM

## 2013-06-05 NOTE — Progress Notes (Signed)
Doing well Family appreciative at bedside Mild tachycardia o/n.  Thrombocytopenia/anemia noted.  Reviewed Dr. Dennie Maizes note.  Mobilize.   AVR post op.   Appreciate CT surg team care/ expedited care.

## 2013-06-05 NOTE — Progress Notes (Signed)
Patient ID: Joseph Garner, male   DOB: 12/04/1923, 79 y.o.   MRN: [id number] EVENING ROUNDS NOTE :     [address]       [place] [address]             [phone]                 1 Day Post-Op Procedure(s) (LRB): AORTIC VALVE REPLACEMENT (AVR) (N/A) INTRAOPERATIVE TRANSESOPHAGEAL ECHOCARDIOGRAM (N/A) CORONARY ARTERY BYPASS GRAFTING (CABG) (N/A) WEDGE RESECTION Right Lower Lobe (Right)  Total Length of Stay:  LOS: 5 days  BP 102/57  Pulse 88  Temp(Src) 97.9 F (36.6 C) (Oral)  Resp 16  Ht 5\' 8"  (1.727 m)  Wt 165 lb 2 oz (74.9 kg)  BMI 25.11 kg/m2  SpO2 94%  .Intake/Output     08/09 0701 - 08/10 0700   P.O. 480   I.V. (mL/kg) 355.6 (4.7)   Blood    NG/GT    IV Piggyback 50   Total Intake(mL/kg) 885.6 (11.8)   Urine (mL/kg/hr) 305 (0.3)   Emesis/NG output    Blood    Chest Tube 170 (0.2)   Total Output 475   Net +410.6         . sodium chloride 20 mL/hr at 06/05/13 0000  . sodium chloride 20 mL/hr at 06/04/13 1700  . sodium chloride    . dexmedetomidine Stopped (06/04/13 1930)  . DOPamine 1.143 mcg/kg/min (06/05/13 0900)  . lactated ringers 20 mL/hr (06/04/13 1345)  . nitroGLYCERIN Stopped (06/04/13 1430)  . phenylephrine (NEO-SYNEPHRINE) Adult infusion Stopped (06/05/13 1300)     Lab Results  Component Value Date   WBC 19.3* 06/05/2013   HGB 8.0* 06/05/2013   HCT 21.8* 06/05/2013   PLT 88* 06/05/2013   GLUCOSE 141* 06/05/2013   CHOL 120 06/04/2013   TRIG 51 06/04/2013   HDL 50 06/04/2013   LDLCALC 60 06/04/2013   ALT 10 06/04/2013   AST 13 06/04/2013   NA 132* 06/05/2013   K 4.3 06/05/2013   CL 99 06/05/2013   CREATININE 1.31 06/05/2013   BUN 17 06/05/2013   CO2 22 06/05/2013   TSH 1.697 05/31/2013   INR 1.55* 06/04/2013   HGBA1C 5.3 06/04/2013   Up to chair, awake and neuro intact Off pacer   Delight Ovens MD  Beeper [phone] Office [phone] 06/05/2013 7:40 PM

## 2013-06-05 NOTE — Procedures (Signed)
Extubation Procedure Note  Patient Details:   Name: Loyd Marhefka DOB: Feb 04, 1924 MRN: [id number]   Airway Documentation:     Evaluation  O2 sats: stable throughout Complications: No apparent complications Patient did tolerate procedure well. Bilateral Breath Sounds: Clear;Diminished   Yes  Pt extubated on 06/05/13 at 01:08. Pt had a NIF of -20 cmH20 and a VC of 1.65L. Pt was able to speak full name. Pt extubated to 6L [place] with SAT of 100%.   Gaetano Hawthorne 06/05/2013, 1:12 AM

## 2013-06-05 NOTE — Progress Notes (Signed)
Hypoglycemic Event  CBG: 64  Treatment: 14ml D50 per Glucostabilizer  Symptoms: None  Follow-up CBG: Time:0930  CBG Result:164  Possible Reasons for Event:not eating very much  Comments/MD notified:Dr. Tyrone Sage aware    Darren Nodal, Augustina Mood  Remember to initiate Hypoglycemia Order Set & complete

## 2013-06-05 NOTE — Plan of Care (Signed)
Problem: Phase II Progression Outcomes Goal: Patient extubated within - Outcome: Completed/Met Date Met:  06/05/13 Extubated at about 12 hours

## 2013-06-06 ENCOUNTER — Inpatient Hospital Stay (HOSPITAL_COMMUNITY): Payer: Medicare Other

## 2013-06-06 LAB — GLUCOSE, CAPILLARY
Glucose-Capillary: 127 mg/dL — ABNORMAL HIGH (ref 70–99)
Glucose-Capillary: 126 mg/dL — ABNORMAL HIGH (ref 70–99)
Glucose-Capillary: 108 mg/dL — ABNORMAL HIGH (ref 70–99)
Glucose-Capillary: 128 mg/dL — ABNORMAL HIGH (ref 70–99)
Glucose-Capillary: 136 mg/dL — ABNORMAL HIGH (ref 70–99)
Glucose-Capillary: 98 mg/dL (ref 70–99)

## 2013-06-06 LAB — BASIC METABOLIC PANEL
BUN: 24 mg/dL — ABNORMAL HIGH (ref 6–23)
CO2: 23 mEq/L (ref 19–32)
Calcium: 8.2 mg/dL — ABNORMAL LOW (ref 8.4–10.5)
Chloride: 97 mEq/L (ref 96–112)
Creatinine, Ser: 1.45 mg/dL — ABNORMAL HIGH (ref 0.50–1.35)
GFR calc Af Amer: 48 mL/min — ABNORMAL LOW (ref >90)
GFR calc non Af Amer: 42 mL/min — ABNORMAL LOW (ref >90)
Glucose, Bld: 121 mg/dL — ABNORMAL HIGH (ref 70–99)
Potassium: 4.1 mEq/L (ref 3.5–5.1)
Sodium: 128 mEq/L — ABNORMAL LOW (ref 135–145)

## 2013-06-06 LAB — CBC
HCT: 19.8 % — ABNORMAL LOW (ref 39.0–52.0)
Hemoglobin: 7.2 g/dL — ABNORMAL LOW (ref 13.0–17.0)
MCH: 34.1 pg — ABNORMAL HIGH (ref 26.0–34.0)
MCHC: 36.4 g/dL — ABNORMAL HIGH (ref 30.0–36.0)
MCV: 93.8 fL (ref 78.0–100.0)
Platelets: 82 10*3/uL — ABNORMAL LOW (ref 150–400)
RBC: 2.11 MIL/uL — ABNORMAL LOW (ref 4.22–5.81)
RDW: 13.9 % (ref 11.5–15.5)
WBC: 17.2 10*3/uL — ABNORMAL HIGH (ref 4.0–10.5)

## 2013-06-06 MED ORDER — FUROSEMIDE 10 MG/ML IJ SOLN
40.0000 mg | Freq: Once | INTRAMUSCULAR | Status: AC
  Administered 2013-06-06: 40 mg via INTRAVENOUS

## 2013-06-06 NOTE — Op Note (Signed)
NAMENYLES, MITTON NO.:  [id number]  MEDICAL RECORD NO.:  [id number]  LOCATION:  2308                         FACILITY:  [facility]  PHYSICIAN:  Sheliah Plane, MD    DATE OF BIRTH:  11/25/1923  DATE OF PROCEDURE:  06/03/2013 DATE OF DISCHARGE:                              OPERATIVE REPORT   PREOPERATIVE DIAGNOSES:  Critical aortic stenosis with acute and chronic systolic heart failure and coronary occlusive disease and right lung nodule.  POSTOPERATIVE DIAGNOSES:  Critical aortic stenosis with acute and chronic systolic heart failure and coronary occlusive disease and right lung nodule.  SURGICAL PROCEDURE: 1. Aortic valve replacement with pericardial tissue valve, [employer], model #3300 TFX 25 mm, serial [id number]. 2. Coronary artery bypass grafting x1 with the reverse saphenous vein     graft to the intermediate coronary artery with left thigh     endo vein harvesting 3. Wedge resection of right lung lesion.  SURGEON:  Sheliah Plane, MD  FIRST ASSISTANT:  Doree Fudge, PA  BRIEF HISTORY:  The patient is an 79 year old male, who was being considered for aortic valve replacement versus TAVR by Dr. Cornelius Moras.  He had a aortic valve velocity of 4.5 cm/second and was admitted with acute heart failure prior to a final disposition on his surgical case.  He was seen as a reconsult.  Cardiac Surgery was reconsulted and on reviewing the patient's data, it was felt that standard aortic valve replacement with bypass surgery to a reasonable size intermediate vessel and resection of a 1.5 cm right lung nodule was of reasonable risk with SDS score of approximately 5.5.  The risks and options were discussed with the patient and his wife and daughter in detail and he was willing to proceed.  Because of his acute deterioration, we proceeded on the same admission with his surgical procedure.  DESCRIPTION OF PROCEDURE:  With Swan-Ganz and arterial line  monitors in place, the patient underwent general endotracheal anesthesia without incident.  Transesophageal echo probe was placed by Dr. Diamantina Monks. This confirmed a tricuspid aortic valve that was highly calcified with mild-to-moderate aortic insufficiency and critical aortic stenosis. Skin of chest and legs were prepped with Betadine and draped in sterile manner.  Small incision was made at the right knee.  The vein at this site appeared small, so incision was closed and an additional incision made just above the left knee and the greater saphenous vein identified and dissected out with the [employer] endo vein harvesting system.  The vein was of good quality.  Median sternotomy was performed.  The right pleura was opened and a firm mass on the visceral pericardium in the right middle lobe was easily identified.  A small rim of lung was excised with a purple stapler.  The lesion was not frozen as it appeared significantly calcified and benign in nature.  The pericardium was then opened.  The patient was systemically heparinized.  The ascending aorta was known to be dilated just approximately 4 cm.  Because of the patient's age of 37 years, it did not appear prudent to replace the ascending aorta in addition.  The patient was systemically heparinized. The ascending aorta was cannulated.  Retrograde cardioplegia catheter was placed into the coronary sinus through a separate pledgetted suture on the right atrium.  A dual-stage venous cannula was placed.  The patient was placed on cardiopulmonary bypass 2.4 liters/minute/meter squared.  Sites of anastomosis of the moderate size first intermediate vessel was identified.  The LAD itself and circumflex and right coronary arteries were without significant disease.  A right superior pulmonary vein vent was placed.  The patient's body temperature was cooled to 32 degrees.  Aortic cross-clamp was applied.  Antegrade cardioplegia was administered  while the heart was beating.  With the rest, we converted to retrograde cardioplegia, total of 800 mL was given. Myocardial septal temperature was monitored throughout the crossclamp.  Attention was then initially turned to the first intermediate which was opened and it was 1.8 mm in size.  Using a running 7-0 Prolene, distal anastomosis was performed.  Intermittently additional cold blood cardioplegia was administered down the vein graft and also retrograde.  Attention was then turned to the ascending aorta.  Transverse aortotomy was performed. This gave good exposure of the tricuspid aortic valve with severe calcification of the annulus.  The valve was excised and annulus debrided.  The valve was sized for a 25 mm pericardial tissue valve.  A #2 Tycron pledgeted sutures, a total of 19 were placed circumferentially and used to secure the valve in place.  The valve seated well and did not impinge on the right or left coronary ostium which were easily visible and without obstruction.  The aortotomy was then closed with horizontal mattress 4-0 Prolene suture and a second layer of over-and- over running Prolene suture.  The aortic root cardioplegia needle was then removed and single punch aortotomy was created at this site.  The vein was trimmed to the appropriate length and a proximal anastomosis was performed.  Before complete closure, the heart was allowed to passively fill and de-air through the ascending aorta.  The aortic crossclamp was then removed with a total crossclamp time of 112 minutes. Anastomosis was completed.  A 16-gauge needle was introduced into the left ventricular apex to further de-air the heart.  The patient's body temperature was rewarmed to 37 degrees.  He required electrical defibrillation and returned to a sinus rhythm.  He was paced to increased his rate.  Sites of anastomosis were inspected with his body temperature rewarmed to 37 degrees and the TEE showing good  functioning of the aortic valve.  The right superior pulmonary vein vent was removed.  He was then ventilated and weaned from cardiopulmonary bypass without difficulty.  He remained hemodynamically stable.  He was decannulated in usual fashion.  Protamine sulfate was administered with operative field hemostatic.  A right pleural tube and 2 Blake mediastinal drains were left in the pericardium, 1 inferior, 1 anterior. Pericardium was loosely reapproximated.  The sternum was closed with #6 stainless steel wire.  Fascia closed with interrupted 0 Vicryl, running 3-0 Vicryl in subcutaneous tissue, 4-0 subcuticular stitch in skin edges.  Dry dressings were applied.  Sponge and needle count was reported as correct at completion of the procedure.  The patient tolerated the procedure without obvious complication.  He was transferred to the Surgical Intensive Care Unit for further postoperative care.  Total pump time was 146 minutes.  The patient did not require any blood bank blood products during the operative procedure.     Sheliah Plane, MD     EG/MEDQ  D:  06/06/2013  T:  06/06/2013  Job:  [id number]

## 2013-06-06 NOTE — Progress Notes (Signed)
Patient ID: Joseph Garner, male   DOB: 09/01/1924, 79 y.o.   MRN: [id number] EVENING ROUNDS NOTE :     [address]       [place] [id number]             [phone]                 2 Days Post-Op Procedure(s) (LRB): AORTIC VALVE REPLACEMENT (AVR) (N/A) INTRAOPERATIVE TRANSESOPHAGEAL ECHOCARDIOGRAM (N/A) CORONARY ARTERY BYPASS GRAFTING (CABG) (N/A) WEDGE RESECTION Right Lower Lobe (Right)  Total Length of Stay:  LOS: 6 days  BP 135/62  Pulse 75  Temp(Src) 98.4 F (36.9 C) (Oral)  Resp 19  Ht 5\' 8"  (1.727 m)  Wt 166 lb 7.2 oz (75.5 kg)  BMI 25.31 kg/m2  SpO2 98%  .Intake/Output     08/10 0701 - 08/11 0700   P.O. 720   I.V. (mL/kg) 220 (2.9)   IV Piggyback    Total Intake(mL/kg) 940 (12.5)   Urine (mL/kg/hr) 690 (0.7)   Chest Tube    Total Output 690   Net +250         . sodium chloride 20 mL/hr at 06/05/13 0000  . sodium chloride 20 mL/hr at 06/04/13 1700  . sodium chloride    . dexmedetomidine Stopped (06/04/13 1930)  . DOPamine Stopped (06/05/13 0930)  . lactated ringers 20 mL/hr at 06/06/13 0800  . nitroGLYCERIN Stopped (06/04/13 1430)  . phenylephrine (NEO-SYNEPHRINE) Adult infusion Stopped (06/05/13 1300)     Lab Results  Component Value Date   WBC 17.2* 06/06/2013   HGB 7.2* 06/06/2013   HCT 19.8* 06/06/2013   PLT 82* 06/06/2013   GLUCOSE 121* 06/06/2013   CHOL 120 06/04/2013   TRIG 51 06/04/2013   HDL 50 06/04/2013   LDLCALC 60 06/04/2013   ALT 10 06/04/2013   AST 13 06/04/2013   NA 128* 06/06/2013   K 4.1 06/06/2013   CL 97 06/06/2013   CREATININE 1.45* 06/06/2013   BUN 24* 06/06/2013   CO2 23 06/06/2013   TSH 1.697 05/31/2013   INR 1.55* 06/04/2013   HGBA1C 5.3 06/04/2013    awake and neuro intact, waked around unit today with help In afib/flutter rate 100 Off pacer   Delight Ovens MD  Beeper [phone] Office [phone] 06/06/2013 7:24 PM

## 2013-06-06 NOTE — Progress Notes (Signed)
Ambulated on room air while pushing wheelchair for balance.  Moves extremely slowly due to anxiety (states 'I don't want to overdo it,') but has quite a steady gait.  VSS throughout ambulation, with HR 90-100 and spO2 remaining 100%.

## 2013-06-06 NOTE — Progress Notes (Signed)
Subjective:  No current complaints. Was not able to walk this am. He says heart rate was "too fast". AFIB overnight, now NSR with 1st degree AVB  Objective:  Vital Signs in the last 24 hours: Temp:  [97.8 F (36.6 C)-98.5 F (36.9 C)] 98.5 F (36.9 C) (08/10 0749) Pulse Rate:  [78-113] 101 (08/10 1000) Resp:  [10-23] 15 (08/10 1000) BP: (102-145)/(52-82) 140/80 mmHg (08/10 1000) SpO2:  [93 %-100 %] 97 % (08/10 1000) Arterial Line BP: (94-150)/(44-57) 150/57 mmHg (08/09 1400) Weight:  [75.5 kg (166 lb 7.2 oz)] 75.5 kg (166 lb 7.2 oz) (08/10 0500)  Intake/Output from previous day: 08/09 0701 - 08/10 0700 In: 1125.6 [P.O.:480; I.V.:595.6; IV Piggyback:50] Out: 1150 [Urine:980; Chest Tube:170]   Physical Exam: General: Elderly, well nourished, in no acute distress. Head:  Normocephalic and atraumatic. Lungs: Clear to auscultation and percussion. Heart: Normal S1 and S2.  Soft systolic murmur RUSB, No rubs or gallops.  Abdomen: soft, non-tender, positive bowel sounds. Extremities: No clubbing or cyanosis. No edema. Neurologic: Alert   Lab Results:  Recent Labs  06/05/13 1700 06/06/13 0400  WBC 19.3* 17.2*  HGB 8.0* 7.2*  PLT 88* 82*    Recent Labs  06/05/13 0500 06/05/13 1649 06/05/13 1700 06/06/13 0400  NA 133* 132*  --  128*  K 4.1 4.3  --  4.1  CL 104 99  --  97  CO2 22  --   --  23  GLUCOSE 102* 141*  --  121*  BUN 13 17  --  24*  CREATININE 0.82 1.20 1.31 1.45*   Personally viewed.   Telemetry: PAF Personally viewed.   Assessment/Plan:  Principal Problem:   Acute diastolic CHF (congestive heart failure) Active Problems:   Severe aortic stenosis   Nodule of right lung   SVT (supraventricular tachycardia)   Hyposmolality and/or hyponatremia   Bioprosthetic aortic valve replacement during current hospitalization  1) AFIB  - metoprolol low dose  - currently NSR with 1st degree AVB  - likely to return. Continue with Bb. Consider amiodarone if  becomes worse.   - ASA. If becomes persistent, will need to weight risks and benefits of full anticoagulation (currently anemic, thrombocytopenic).   2) Post bioprosthetic AV  3) Watching creat. Hg/Plts   SKAINS, MARK 06/06/2013, 10:48 AM

## 2013-06-06 NOTE — Progress Notes (Addendum)
Patient ID: Joseph Garner, male   DOB: 1923/12/31, 79 y.o.   MRN: [id number] TCTS DAILY ICU PROGRESS NOTE                   [address]            [name] [address]          [phone]   2 Days Post-Op Procedure(s) (LRB): AORTIC VALVE REPLACEMENT (AVR) (N/A) INTRAOPERATIVE TRANSESOPHAGEAL ECHOCARDIOGRAM (N/A) CORONARY ARTERY BYPASS GRAFTING (CABG) (N/A) WEDGE RESECTION Right Lower Lobe (Right)  Total Length of Stay:  LOS: 6 days   Subjective: No confused but says was hallucinating last pm  Objective: Vital signs in last 24 hours: Temp:  [97.8 F (36.6 C)-98.6 F (37 C)] 98.5 F (36.9 C) (08/10 0749) Pulse Rate:  [78-111] 91 (08/10 0800) Cardiac Rhythm:  [-] Heart block (08/10 0800) Resp:  [10-23] 10 (08/10 0800) BP: (102-145)/(52-76) 124/62 mmHg (08/10 0800) SpO2:  [93 %-100 %] 96 % (08/10 0800) Arterial Line BP: (86-150)/(40-57) 150/57 mmHg (08/09 1400) Weight:  [166 lb 7.2 oz (75.5 kg)] 166 lb 7.2 oz (75.5 kg) (08/10 0500)  Filed Weights   06/04/13 0459 06/05/13 0500 06/06/13 0500  Weight: 154 lb 6.4 oz (70.035 kg) 165 lb 2 oz (74.9 kg) 166 lb 7.2 oz (75.5 kg)    Weight change: 1 lb 5.2 oz (0.6 kg)   Hemodynamic parameters for last 24 hours: PAP: (16-23)/(7-14) 16/7 mmHg CO:  [4 L/min] 4 L/min CI:  [2.1 L/min/m2] 2.1 L/min/m2  Intake/Output from previous day: 08/09 0701 - 08/10 0700 In: 1125.6 [P.O.:480; I.V.:595.6; IV Piggyback:50] Out: 1150 [Urine:980; Chest Tube:170]  Intake/Output this shift: Total I/O In: 20 [I.V.:20] Out: -   Current Meds: Scheduled Meds: . acetaminophen  1,000 mg Oral Q6H   Or  . acetaminophen (TYLENOL) oral liquid 160 mg/5 mL  1,000 mg Per Tube Q6H  . aspirin EC  325 mg Oral Daily   Or  . aspirin  324 mg Per Tube Daily  . bisacodyl  10 mg Oral Daily   Or  . bisacodyl  10 mg Rectal Daily  . Chlorhexidine Gluconate Cloth  6 each Topical Daily  . darifenacin  7.5 mg Oral Daily  . docusate sodium  200 mg  Oral Daily  . doxazosin  4 mg Oral QHS  . insulin aspart  0-24 Units Subcutaneous Q4H  . metoprolol tartrate  12.5 mg Oral BID   Or  . metoprolol tartrate  12.5 mg Per Tube BID  . mupirocin ointment  1 application Nasal BID  . pantoprazole  40 mg Oral Daily  . sodium chloride  3 mL Intravenous Q12H   Continuous Infusions: . sodium chloride 20 mL/hr at 06/05/13 0000  . sodium chloride 20 mL/hr at 06/04/13 1700  . sodium chloride    . dexmedetomidine Stopped (06/04/13 1930)  . DOPamine stopped  . lactated ringers 20 mL/hr at 06/06/13 0800  . nitroGLYCERIN Stopped (06/04/13 1430)  . phenylephrine (NEO-SYNEPHRINE) Adult infusion Stopped (06/05/13 1300)   PRN Meds:.metoprolol, midazolam, morphine injection, ondansetron (ZOFRAN) IV, oxyCODONE, sodium chloride, traMADol  General appearance: alert and cooperative Neurologic: intact Heart: regular rate and rhythm, S1, S2 normal, no murmur, click, rub or gallop Lungs: diminished breath sounds bibasilar Abdomen: soft, non-tender; bowel sounds normal; no masses,  no organomegaly Extremities: extremities normal, atraumatic, no cyanosis or edema and Homans sign is negative, no sign of DVT Wound: sternum stable  Lab Results: CBC:  Recent Labs  06/05/13 1700 06/06/13 0400  WBC 19.3* 17.2*  HGB 8.0* 7.2*  HCT 21.8* 19.8*  PLT 88* 82*   BMET:   Recent Labs  06/05/13 0500 06/05/13 1649 06/05/13 1700 06/06/13 0400  NA 133* 132*  --  128*  K 4.1 4.3  --  4.1  CL 104 99  --  97  CO2 22  --   --  23  GLUCOSE 102* 141*  --  121*  BUN 13 17  --  24*  CREATININE 0.82 1.20 1.31 1.45*  CALCIUM 8.0*  --   --  8.2*    PT/INR:   Recent Labs  06/04/13 1345  LABPROT 18.2*  INR 1.55*   Radiology: Dg Chest Port 1 View  06/06/2013   *RADIOLOGY REPORT*  Clinical Data: Cough; heavy breathing.  Recent cardiac surgery.  PORTABLE CHEST - 1 VIEW  Comparison: Chest radiograph performed 06/05/2013  Findings: Retrocardiac airspace  opacification is again noted; this may reflect postoperative atelectasis.  Minimal right basilar opacity likely also reflects atelectasis.  Previous noted lines and tubes have been removed, aside from a right IJ sheath, seen ending overlying the proximal SVC.  No definite pleural effusion or pneumothorax is seen.  The cardiomediastinal silhouette is borderline enlarged.  The patient is status post median sternotomy, with evidence of prior CABG.  An aortic valve replacement is noted.  Calcification is noted within the aortic arch.  No acute osseous abnormalities are identified.  IMPRESSION:  1.  Persistent retrocardiac airspace opacification and minimal right basilar opacity likely reflect postoperative atelectasis. 2.  Borderline cardiomegaly.   Original Report Authenticated By: Tonia Ghent, M.D.   Dg Chest Portable 1 View In Am  06/05/2013   *RADIOLOGY REPORT*  Clinical Data: Postop.  PORTABLE CHEST - 1 VIEW  Comparison:  Chest x-ray from yesterday.  Findings:   Status post tracheal esophageal extubation.  Swan-Ganz catheter by right IJ approach continues to terminate in the region of the right pulmonary artery. Thoracic drains in similar position.  Unchanged cardiopericardial enlargement. Status post aortic valve replacement and CABG. Persistent retrocardiac opacity.  Haziness at the right base is likely postoperative atelectasis.  No significant pneumothorax.  IMPRESSION:  1.  Unchanged left more than right base opacities, likely postoperative atelectasis. 2.  Remaining support apparatus in unremarkable position, as above.   Original Report Authenticated By: Tiburcio Pea   Dg Chest Portable 1 View  06/04/2013   *RADIOLOGY REPORT*  Clinical Data: Post CABG  PORTABLE CHEST - 1 VIEW  Comparison: Portable exam 1342 hours compared to 06/01/2013  Findings: Tip of endotracheal tube projects 6.0 cm above carina. Nasogastric tube extends into stomach. Right jugular Swan-Ganz catheter tip projects over proximal  right pulmonary artery. Enlargement of cardiac silhouette post CABG and AVR. Atherosclerotic calcification aorta. Mediastinal contours and pulmonary vascularity normal. Atelectasis left lower lobe. Minimal linear subsegmental atelectasis at minor fissure. No acute infiltrate or pneumothorax. Bones unremarkable.  IMPRESSION: Postoperative changes as above.   Original Report Authenticated By: Ulyses Southward, M.D.     Assessment/Plan: S/P Procedure(s) (LRB): AORTIC VALVE REPLACEMENT (AVR) (N/A) INTRAOPERATIVE TRANSESOPHAGEAL ECHOCARDIOGRAM (N/A) CORONARY ARTERY BYPASS GRAFTING (CABG) (N/A) WEDGE RESECTION Right Lower Lobe (Right) Mobilize Diuresis- lasix today Mostly Afib-  Continue foley due to diuresing patient,and urinary output monitoring, has very large prostate 7 cm See progression orders Expected Acute  Blood - loss Anemia, no transfusion given Thrombocytopenia - avoid heparin Cr increased to 1.45, GFR 42  (Baseline- 77 to 48)   37% change  consistent with AKI "risk" not "  Injury"    Meyer Arora B 06/06/2013 8:41 AM

## 2013-06-07 ENCOUNTER — Other Ambulatory Visit (HOSPITAL_COMMUNITY): Payer: Medicare Other

## 2013-06-07 ENCOUNTER — Encounter (HOSPITAL_COMMUNITY): Payer: Self-pay | Admitting: Interventional Cardiology

## 2013-06-07 ENCOUNTER — Ambulatory Visit (HOSPITAL_COMMUNITY): Payer: Medicare Other

## 2013-06-07 DIAGNOSIS — I4892 Unspecified atrial flutter: Secondary | ICD-10-CM | POA: Insufficient documentation

## 2013-06-07 LAB — BASIC METABOLIC PANEL
BUN: 35 mg/dL — ABNORMAL HIGH (ref 6–23)
CO2: 23 mEq/L (ref 19–32)
Calcium: 8.1 mg/dL — ABNORMAL LOW (ref 8.4–10.5)
Chloride: 97 mEq/L (ref 96–112)
Creatinine, Ser: 1.29 mg/dL (ref 0.50–1.35)
GFR calc Af Amer: 56 mL/min — ABNORMAL LOW (ref >90)
GFR calc non Af Amer: 48 mL/min — ABNORMAL LOW (ref >90)
Glucose, Bld: 106 mg/dL — ABNORMAL HIGH (ref 70–99)
Potassium: 3.5 mEq/L (ref 3.5–5.1)
Sodium: 129 mEq/L — ABNORMAL LOW (ref 135–145)

## 2013-06-07 LAB — TYPE AND SCREEN
ABO/RH(D): O NEG
Antibody Screen: NEGATIVE
Unit division: 0
Unit division: 0

## 2013-06-07 LAB — CBC
HCT: 19.6 % — ABNORMAL LOW (ref 39.0–52.0)
Hemoglobin: 7.1 g/dL — ABNORMAL LOW (ref 13.0–17.0)
MCH: 33.8 pg (ref 26.0–34.0)
MCHC: 36.2 g/dL — ABNORMAL HIGH (ref 30.0–36.0)
MCV: 93.3 fL (ref 78.0–100.0)
Platelets: 77 10*3/uL — ABNORMAL LOW (ref 150–400)
RBC: 2.1 MIL/uL — ABNORMAL LOW (ref 4.22–5.81)
RDW: 13.9 % (ref 11.5–15.5)
WBC: 11.8 10*3/uL — ABNORMAL HIGH (ref 4.0–10.5)

## 2013-06-07 LAB — GLUCOSE, CAPILLARY
Glucose-Capillary: 110 mg/dL — ABNORMAL HIGH (ref 70–99)
Glucose-Capillary: 113 mg/dL — ABNORMAL HIGH (ref 70–99)
Glucose-Capillary: 121 mg/dL — ABNORMAL HIGH (ref 70–99)
Glucose-Capillary: 127 mg/dL — ABNORMAL HIGH (ref 70–99)
Glucose-Capillary: 96 mg/dL (ref 70–99)

## 2013-06-07 LAB — PROTIME-INR
INR: 1.27 (ref 0.00–1.49)
Prothrombin Time: 15.6 seconds — ABNORMAL HIGH (ref 11.6–15.2)

## 2013-06-07 MED ORDER — BISACODYL 10 MG RE SUPP: 10.0000 mg | Freq: Every day | RECTAL | Status: DC | PRN

## 2013-06-07 MED ORDER — PANTOPRAZOLE SODIUM 40 MG PO TBEC
40.0000 mg | DELAYED_RELEASE_TABLET | Freq: Every day | ORAL | Status: DC
  Administered 2013-06-08 – 2013-06-15 (×8): 40 mg via ORAL
  Filled 2013-06-07 (×8): qty 1

## 2013-06-07 MED ORDER — ONDANSETRON HCL 4 MG/2ML IJ SOLN: 4.0000 mg | Freq: Four times a day (QID) | INTRAMUSCULAR | Status: DC | PRN

## 2013-06-07 MED ORDER — DOCUSATE SODIUM 100 MG PO CAPS
200.0000 mg | ORAL_CAPSULE | Freq: Every day | ORAL | Status: DC
  Administered 2013-06-07 – 2013-06-15 (×9): 200 mg via ORAL
  Filled 2013-06-07 (×9): qty 2

## 2013-06-07 MED ORDER — ONDANSETRON HCL 4 MG PO TABS
4.0000 mg | ORAL_TABLET | Freq: Four times a day (QID) | ORAL | Status: DC | PRN
  Filled 2013-06-07: qty 1

## 2013-06-07 MED ORDER — AMIODARONE LOAD VIA INFUSION
150.0000 mg | Freq: Once | INTRAVENOUS | Status: AC
  Administered 2013-06-07: 150 mg via INTRAVENOUS
  Filled 2013-06-07: qty 83.34

## 2013-06-07 MED ORDER — AMIODARONE HCL IN DEXTROSE 360-4.14 MG/200ML-% IV SOLN
30.0000 mg/h | INTRAVENOUS | Status: AC
  Administered 2013-06-07: 30 mg/h via INTRAVENOUS
  Filled 2013-06-07 (×4): qty 200

## 2013-06-07 MED ORDER — SODIUM CHLORIDE 0.9 % IV SOLN: 250.0000 mL | INTRAVENOUS | Status: DC | PRN

## 2013-06-07 MED ORDER — FOLIC ACID 1 MG PO TABS
1.0000 mg | ORAL_TABLET | Freq: Every day | ORAL | Status: DC
  Administered 2013-06-07 – 2013-06-15 (×9): 1 mg via ORAL
  Filled 2013-06-07 (×9): qty 1

## 2013-06-07 MED ORDER — INSULIN ASPART 100 UNIT/ML ~~LOC~~ SOLN: 0.0000 [IU] | Freq: Three times a day (TID) | SUBCUTANEOUS | Status: DC

## 2013-06-07 MED ORDER — FERROUS FUMARATE 325 (106 FE) MG PO TABS
1.0000 | ORAL_TABLET | Freq: Two times a day (BID) | ORAL | Status: DC
  Administered 2013-06-07 – 2013-06-15 (×17): 106 mg via ORAL
  Filled 2013-06-07 (×18): qty 1

## 2013-06-07 MED ORDER — POTASSIUM CHLORIDE 10 MEQ/50ML IV SOLN
10.0000 meq | INTRAVENOUS | Status: DC | PRN
  Administered 2013-06-07 (×2): 10 meq via INTRAVENOUS
  Filled 2013-06-07: qty 150

## 2013-06-07 MED ORDER — METOPROLOL TARTRATE 12.5 MG HALF TABLET
12.5000 mg | ORAL_TABLET | Freq: Two times a day (BID) | ORAL | Status: DC
  Administered 2013-06-07 – 2013-06-15 (×13): 12.5 mg via ORAL
  Filled 2013-06-07 (×18): qty 1

## 2013-06-07 MED ORDER — FUROSEMIDE 40 MG PO TABS
40.0000 mg | ORAL_TABLET | Freq: Every day | ORAL | Status: AC
  Administered 2013-06-07 – 2013-06-09 (×3): 40 mg via ORAL
  Filled 2013-06-07 (×3): qty 1

## 2013-06-07 MED ORDER — POTASSIUM CHLORIDE CRYS ER 10 MEQ PO TBCR
10.0000 meq | EXTENDED_RELEASE_TABLET | Freq: Every day | ORAL | Status: DC
  Administered 2013-06-07 – 2013-06-08 (×2): 10 meq via ORAL
  Filled 2013-06-07 (×3): qty 1

## 2013-06-07 MED ORDER — SODIUM CHLORIDE 0.9 % IJ SOLN: 3.0000 mL | INTRAMUSCULAR | Status: DC | PRN

## 2013-06-07 MED ORDER — ATORVASTATIN CALCIUM 20 MG PO TABS
20.0000 mg | ORAL_TABLET | Freq: Every day | ORAL | Status: DC
  Administered 2013-06-09 – 2013-06-14 (×5): 20 mg via ORAL
  Filled 2013-06-07 (×9): qty 1

## 2013-06-07 MED ORDER — SODIUM CHLORIDE 0.9 % IJ SOLN
3.0000 mL | Freq: Two times a day (BID) | INTRAMUSCULAR | Status: DC
  Administered 2013-06-07 – 2013-06-10 (×3): 3 mL via INTRAVENOUS

## 2013-06-07 MED ORDER — AMIODARONE HCL IN DEXTROSE 360-4.14 MG/200ML-% IV SOLN
60.0000 mg/h | INTRAVENOUS | Status: AC
  Administered 2013-06-07 (×2): 60 mg/h via INTRAVENOUS
  Filled 2013-06-07 (×2): qty 200

## 2013-06-07 MED ORDER — MOVING RIGHT ALONG BOOK
Freq: Once | Status: AC
  Administered 2013-06-07: 09:00:00
  Filled 2013-06-07: qty 1

## 2013-06-07 MED ORDER — OXYCODONE HCL 5 MG PO TABS: 5.0000 mg | ORAL_TABLET | ORAL | Status: DC | PRN

## 2013-06-07 MED ORDER — POTASSIUM CHLORIDE 10 MEQ/50ML IV SOLN
10.0000 meq | Freq: Once | INTRAVENOUS | Status: AC
  Administered 2013-06-07: 10 meq via INTRAVENOUS

## 2013-06-07 MED ORDER — BISACODYL 5 MG PO TBEC
10.0000 mg | DELAYED_RELEASE_TABLET | Freq: Every day | ORAL | Status: DC | PRN
  Administered 2013-06-10 – 2013-06-14 (×3): 10 mg via ORAL
  Filled 2013-06-07 (×3): qty 2

## 2013-06-07 MED ORDER — ADULT MULTIVITAMIN W/MINERALS CH
1.0000 | ORAL_TABLET | Freq: Every day | ORAL | Status: DC
  Administered 2013-06-07 – 2013-06-15 (×9): 1 via ORAL
  Filled 2013-06-07 (×9): qty 1

## 2013-06-07 MED FILL — Heparin Sodium (Porcine) Inj 1000 Unit/ML: INTRAMUSCULAR | Qty: 30 | Status: AC

## 2013-06-07 MED FILL — Lidocaine HCl IV Inj 20 MG/ML: INTRAVENOUS | Qty: 5 | Status: AC

## 2013-06-07 MED FILL — Heparin Sodium (Porcine) Inj 1000 Unit/ML: INTRAMUSCULAR | Qty: 10 | Status: AC

## 2013-06-07 MED FILL — Potassium Chloride Inj 2 mEq/ML: INTRAVENOUS | Qty: 40 | Status: AC

## 2013-06-07 MED FILL — Mannitol IV Soln 20%: INTRAVENOUS | Qty: 500 | Status: AC

## 2013-06-07 MED FILL — Sodium Chloride Irrigation Soln 0.9%: Qty: 3000 | Status: AC

## 2013-06-07 MED FILL — Electrolyte-R (PH 7.4) Solution: INTRAVENOUS | Qty: 4000 | Status: AC

## 2013-06-07 MED FILL — Sodium Bicarbonate IV Soln 8.4%: INTRAVENOUS | Qty: 50 | Status: AC

## 2013-06-07 MED FILL — Sodium Chloride IV Soln 0.9%: INTRAVENOUS | Qty: 1000 | Status: AC

## 2013-06-07 MED FILL — Magnesium Sulfate Inj 50%: INTRAMUSCULAR | Qty: 10 | Status: AC

## 2013-06-07 NOTE — Progress Notes (Signed)
Transferred to 2W23 via monitor and wheelchair, SCD's, RN to receive in room

## 2013-06-07 NOTE — Progress Notes (Addendum)
Patient ID: Joseph Garner, male   DOB: [birth year], 79 y.o.   MRN: [id number] TCTS DAILY ICU PROGRESS NOTE                   [address]            [place] [address]          [phone]   3 Days Post-Op Procedure(s) (LRB): AORTIC VALVE REPLACEMENT (AVR) (N/A) INTRAOPERATIVE TRANSESOPHAGEAL ECHOCARDIOGRAM (N/A) CORONARY ARTERY BYPASS GRAFTING (CABG) (N/A) WEDGE RESECTION Right Lower Lobe (Right)  Total Length of Stay:  LOS: 7 days   Subjective: Feels better today, walked around the unit this am  Objective: Vital signs in last 24 hours: Temp:  [97.6 F (36.4 C)-98.5 F (36.9 C)] 97.6 F (36.4 C) (08/11 0400) Pulse Rate:  [67-113] 81 (08/11 0600) Cardiac Rhythm:  [-] Atrial flutter (08/11 0400) Resp:  [10-23] 13 (08/11 0600) BP: (90-147)/(43-91) 145/58 mmHg (08/11 0600) SpO2:  [95 %-100 %] 95 % (08/11 0600) Weight:  [167 lb 15.9 oz (76.2 kg)] 167 lb 15.9 oz (76.2 kg) (08/11 0500)  Filed Weights   06/05/13 0500 06/06/13 0500 06/07/13 0500  Weight: 165 lb 2 oz (74.9 kg) 166 lb 7.2 oz (75.5 kg) 167 lb 15.9 oz (76.2 kg)    Weight change: 1 lb 8.7 oz (0.7 kg)   Hemodynamic parameters for last 24 hours:    Intake/Output from previous day: 08/10 0701 - 08/11 0700 In: 1230 [P.O.:720; I.V.:460; IV Piggyback:50] Out: 1105 [Urine:1105]  Intake/Output this shift:    Current Meds: Scheduled Meds: . acetaminophen  1,000 mg Oral Q6H   Or  . acetaminophen (TYLENOL) oral liquid 160 mg/5 mL  1,000 mg Per Tube Q6H  . aspirin EC  325 mg Oral Daily   Or  . aspirin  324 mg Per Tube Daily  . bisacodyl  10 mg Oral Daily   Or  . bisacodyl  10 mg Rectal Daily  . Chlorhexidine Gluconate Cloth  6 each Topical Daily  . darifenacin  7.5 mg Oral Daily  . docusate sodium  200 mg Oral Daily  . doxazosin  4 mg Oral QHS  . insulin aspart  0-24 Units Subcutaneous Q4H  . metoprolol tartrate  12.5 mg Oral BID   Or  . metoprolol tartrate  12.5 mg Per Tube BID  .  mupirocin ointment  1 application Nasal BID  . pantoprazole  40 mg Oral Daily  . sodium chloride  3 mL Intravenous Q12H   Continuous Infusions: . sodium chloride 20 mL/hr at 06/05/13 0000  . sodium chloride 20 mL/hr at 06/04/13 1700  . sodium chloride    . dexmedetomidine Stopped (06/04/13 1930)  . DOPamine Stopped (06/05/13 0930)  . lactated ringers 20 mL/hr at 06/06/13 0800  . nitroGLYCERIN Stopped (06/04/13 1430)  . phenylephrine (NEO-SYNEPHRINE) Adult infusion Stopped (06/05/13 1300)   PRN Meds:.metoprolol, midazolam, morphine injection, ondansetron (ZOFRAN) IV, oxyCODONE, potassium chloride, sodium chloride, traMADol  General appearance: alert, cooperative and appears stated age Neurologic: intact Heart: regular rate and rhythm, S1, S2 normal, no murmur, click, rub or gallop Lungs: clear to auscultation bilaterally Abdomen: soft, non-tender; bowel sounds normal; no masses,  no organomegaly Extremities: extremities normal, atraumatic, no cyanosis or edema and Homans sign is negative, no sign of DVT Wound: sternum stable  Lab Results: CBC: Recent Labs  06/06/13 0400 06/07/13 0400  WBC 17.2* 11.8*  HGB 7.2* 7.1*  HCT 19.8* 19.6*  PLT 82* 77*  BMET:  Recent Labs  06/06/13 0400 06/07/13 0400  NA 128* 129*  K 4.1 3.5  CL 97 97  CO2 23 23  GLUCOSE 121* 106*  BUN 24* 35*  CREATININE 1.45* 1.29  CALCIUM 8.2* 8.1*    PT/INR:  Recent Labs  06/07/13 0400  LABPROT 15.6*  INR 1.27   Radiology: Dg Chest Port 1 View  06/06/2013   *RADIOLOGY REPORT*  Clinical Data: Cough; heavy breathing.  Recent cardiac surgery.  PORTABLE CHEST - 1 VIEW  Comparison: Chest radiograph performed 06/05/2013  Findings: Retrocardiac airspace opacification is again noted; this may reflect postoperative atelectasis.  Minimal right basilar opacity likely also reflects atelectasis.  Previous noted lines and tubes have been removed, aside from a right IJ sheath, seen ending overlying the proximal  SVC.  No definite pleural effusion or pneumothorax is seen.  The cardiomediastinal silhouette is borderline enlarged.  The patient is status post median sternotomy, with evidence of prior CABG.  An aortic valve replacement is noted.  Calcification is noted within the aortic arch.  No acute osseous abnormalities are identified.  IMPRESSION:  1.  Persistent retrocardiac airspace opacification and minimal right basilar opacity likely reflect postoperative atelectasis. 2.  Borderline cardiomegaly.   Original Report Authenticated By: Tonia Ghent, M.D.   Lab Results  Component Value Date   CREATININE 1.29 06/07/2013   CREATININE: 1.29 (06/07/13 0400) Estimated creatinine clearance - Cockcroft-Gault CrCl: 39 mL/min Assessment/Plan: S/P Procedure(s) (LRB): AORTIC VALVE REPLACEMENT (AVR) (N/A) INTRAOPERATIVE TRANSESOPHAGEAL ECHOCARDIOGRAM (N/A) CORONARY ARTERY BYPASS GRAFTING (CABG) (N/A) WEDGE RESECTION Right Lower Lobe (Right) Mobilize Diuresis Plan for transfer to step-down: see transfer orders Acute Kidney Injury (any one) now resolved  Increase in SCr by > 0.3 within 48 hours  Increase SCr to > 1.5 times baseline  Urine volume < 0.5 ml/kg/h for 6 hrs  Stage:  X Risk:   1.5x increase in creatinine or GFR decrease by 25% or UOP <0.58ml/kgperhr for 6 hrs  Injury:  2x increase in creatinine or GFR decrease by 50% or UOP < 0.76ml/kgperhr for 12 hr  Failure:3X increase in creatinine or GFR decrease by 75% or UOP < 0.9ml/kgperhr for 12 hr or anuria 12 hrs  Loss: complete loss of kidney  function for more then 4 weeks  End-stage renal disease:Complete loss of kidney function for more then 3 months  Cardiology to address rhythm - episodic aflutter, and pauses    Brigette Hopfer B 06/07/2013 7:46 AM

## 2013-06-07 NOTE — Evaluation (Signed)
Physical Therapy Evaluation Patient Details Name: Joseph Garner MRN: [id number] DOB: [birth year] Today's Date: 06/07/2013 Time: 4098-1191 PT Time Calculation (min): 26 min  PT Assessment / Plan / Recommendation History of Present Illness  79 y.o. male admitted to [facility] 05/31/13 with worsening SOB and DOE.  He has had an aortic valave replacement week.    Clinical Impression  Pt demonstrates deficits in functional mobility as indicated below. Pt will benefit from continued skilled PT to address deficits and maximize function. At this time anticipate dc to HHPT and family assist, if family unable to provide assist, patient may need SNF.    PT Assessment  Patient needs continued PT services    Follow Up Recommendations  Home health PT;Supervision/Assistance - 24 hour          Equipment Recommendations  None recommended by PT    Recommendations for Other Services     Frequency Min 4X/week    Precautions / Restrictions Precautions Precautions: Fall Precaution Comments: Monitor HR, DOE, and O2 sats with mobility Restrictions Weight Bearing Restrictions: No   Pertinent Vitals/Pain 2/10 at rest      Mobility  Bed Mobility Bed Mobility: Sit to Supine (pt seated EOB) Sit to Supine: 3: Mod assist Transfers Transfers: Sit to Stand;Stand to Sit Sit to Stand: 4: Min assist Stand to Sit: 4: Min assist Details for Transfer Assistance: VCs for sternal precautions and rocker tecnique Ambulation/Gait Ambulation/Gait Assistance: 4: Min assist Ambulation Distance (Feet): 160 Feet Assistive device: Rolling walker Ambulation/Gait Assistance Details: assist for pacing, VCs for relaxation (patient very rigid and guarded with ambulation) Gait Pattern: Step-through pattern;Decreased stride length Gait velocity: decreased General Gait Details: very rigid, needs to relax with ambulation    Exercises Other Exercises Other Exercises: Encouraged to continue use of IS   PT Diagnosis: Difficulty  walking;Acute pain  PT Problem List: Decreased strength;Decreased activity tolerance;Decreased balance;Decreased mobility PT Treatment Interventions: DME instruction;Gait training;Stair training;Functional mobility training;Therapeutic activities;Therapeutic exercise;Balance training;Patient/family education     PT Goals(Current goals can be found in the care plan section) Acute Rehab PT Goals Patient Stated Goal: to get back to independent lifestyle PT Goal Formulation: With patient Time For Goal Achievement: 06/21/13 Potential to Achieve Goals: Fair  Visit Information  Last PT Received On: 06/07/13 Assistance Needed: +1 History of Present Illness: 79 y.o. male admitted to [facility] 05/31/13 with worsening SOB and DOE.  He was scheduled to have an aortic valave replacement next week.  Cardiac surgery is looking to move the surgery forward as the pt's symptoms have become more emergent and severe.         Prior Functioning  Home Living Family/patient expects to be discharged to:: Private residence Living Arrangements: Spouse/significant other Available Help at Discharge: Family;Available 24 hours/day Type of Home: House Home Access: Stairs to enter [employer] of Steps: 6 Entrance Stairs-Rails: Left (brick wall on right that he can use for support) Home Layout: One level Home Equipment: None;Other (comment) (reports that he might have a walker, but not sure) Prior Function Level of Independence: Independent Comments: was independent and walking any distance until his hernia surgery in April of 2014 since then he has limited ambulation which got significantly worse over the past week.   Communication Communication: HOH    Cognition  Cognition Arousal/Alertness: Awake/alert Behavior During Therapy: WFL for tasks assessed/performed Overall Cognitive Status: Within Functional Limits for tasks assessed    Extremity/Trunk Assessment Cervical / Trunk Assessment Cervical / Trunk  Assessment: Normal   Balance  Balance Balance Assessed: Yes High Level Balance High Level Balance Activites: Side stepping;Backward walking;Direction changes;Turns;Head turns High Level Balance Comments: Min guard for instability; VCs to increase speed and momentum for improved balance  End of Session PT - End of Session Equipment Utilized During Treatment: Gait belt Activity Tolerance: Patient limited by fatigue Patient left: in bed;with call bell/phone within reach;with family/visitor present Nurse Communication: Mobility status;Precautions  GP     Fabio Asa 06/07/2013, 1:02 PM Charlotte Crumb, PT DPT  [phone]

## 2013-06-07 NOTE — Progress Notes (Signed)
SUBJECTIVE:  No palpitations.  Feels better.  Ready to move to tele.  HR has been high and he has had some pauses requiring pacemaker to fire.  OBJECTIVE:   Vitals:   Filed Vitals:   06/07/13 0600 06/07/13 0753 06/07/13 0800 06/07/13 0900  BP: 145/58  143/65 132/57  Pulse: 81  85 96  Temp:  98.3 F (36.8 C)    TempSrc:  Oral    Resp: 13  22 19   Height:      Weight:      SpO2: 95%  98% 96%   I&O's:   Intake/Output Summary (Last 24 hours) at 06/07/13 0949 Last data filed at 06/07/13 0930  Gross per 24 hour  Intake   1293 ml  Output    980 ml  Net    313 ml   TELEMETRY: Reviewed telemetry pt in NSR, Atrial flutter, transient high degree heart block:     PHYSICAL EXAM General: Well developed, well nourished, in no acute distress Head:   Normal cephalic and atramatic  Lungs:  No wheezing Heart:  Intermittent tachycardia, S1 S2, irregular  Msk:  Back normal, normal gait. Normal strength and tone for age. Extremities:  tr edema.  DP +1 Neuro: Alert  Psych:  Normal affect, responds appropriately   LABS: Basic Metabolic Panel:  Recent Labs  16/10/96 0500  06/05/13 1700 06/06/13 0400 06/07/13 0400  NA 133*  < >  --  128* 129*  K 4.1  < >  --  4.1 3.5  CL 104  < >  --  97 97  CO2 22  --   --  23 23  GLUCOSE 102*  < >  --  121* 106*  BUN 13  < >  --  24* 35*  CREATININE 0.82  < > 1.31 1.45* 1.29  CALCIUM 8.0*  --   --  8.2* 8.1*  MG 2.6*  --  2.5  --   --   < > = values in this interval not displayed. Liver Function Tests: No results found for this basename: AST, ALT, ALKPHOS, BILITOT, PROT, ALBUMIN,  in the last 72 hours No results found for this basename: LIPASE, AMYLASE,  in the last 72 hours CBC:  Recent Labs  06/06/13 0400 06/07/13 0400  WBC 17.2* 11.8*  HGB 7.2* 7.1*  HCT 19.8* 19.6*  MCV 93.8 93.3  PLT 82* 77*   Cardiac Enzymes: No results found for this basename: CKTOTAL, CKMB, CKMBINDEX, TROPONINI,  in the last 72 hours BNP: No components  found with this basename: POCBNP,  D-Dimer: No results found for this basename: DDIMER,  in the last 72 hours Hemoglobin A1C: No results found for this basename: HGBA1C,  in the last 72 hours Fasting Lipid Panel: No results found for this basename: CHOL, HDL, LDLCALC, TRIG, CHOLHDL, LDLDIRECT,  in the last 72 hours Thyroid Function Tests: No results found for this basename: TSH, T4TOTAL, FREET3, T3FREE, THYROIDAB,  in the last 72 hours Anemia Panel: No results found for this basename: VITAMINB12, FOLATE, FERRITIN, TIBC, IRON, RETICCTPCT,  in the last 72 hours Coag Panel:   Lab Results  Component Value Date   INR 1.27 06/07/2013   INR 1.55* 06/04/2013   INR 1.02 06/03/2013    RADIOLOGY: Dg Chest 2 View  06/01/2013   *RADIOLOGY REPORT*  Clinical Data: Difficulty breathing, slight chest pain, history hypertension, former smoker  CHEST - 2 VIEW  Comparison: 02/03/2013  Findings: Enlargement of cardiac silhouette. Atherosclerotic calcification aorta.  Mediastinal contours and pulmonary vascularity normal. Lungs appear mildly emphysematous but clear. No pleural effusion or pneumothorax. Bones unremarkable.  IMPRESSION: Emphysematous changes. Enlargement of cardiac silhouette. No acute abnormalities.   Original Report Authenticated By: Ulyses Southward, M.D.   Nm Pet Image Initial (pi) Skull Base To Thigh  05/26/2013   *RADIOLOGY REPORT*  Clinical Data: Initial treatment strategy for lung nodule.  NUCLEAR MEDICINE PET SKULL BASE TO THIGH  Fasting Blood Glucose:  98  Technique:  18.9 mCi F-18 FDG was injected intravenously. CT data was obtained and used for attenuation correction and anatomic localization only.  (This was not acquired as a diagnostic CT examination.) Additional exam technical data entered on technologist worksheet.  Comparison:  CTA of the chest, abdomen and pelvis 05/20/2013.  Findings:  Neck: No hypermetabolic lymph nodes in the neck.  Chest:  The nodule of concern in the right middle lobe is  unchanged in size measuring 1.5 x 1.3 cm.  This lesion demonstrates no significant hypermetabolism (SUVmax = 1.4)on the PET portion of the examination.No other suspicious appearing pulmonary nodules or masses are noted. No hypermetabolic mediastinal or hilar nodes.  Abdomen/Pelvis:  No abnormal hypermetabolic activity within the liver, pancreas, adrenal glands, or spleen.  No hypermetabolic lymph nodes in the abdomen or pelvis.  Skeleton:  No focal hypermetabolic activity to suggest skeletal metastasis.  IMPRESSION: 1.  1.5 x 1.3 cm non hypermetabolic nodule in the right middle lobe.  Given these findings, and despite the slow interval growth compared to remote prior examinations, this is favored to be a benign nodule, potentially a slow growing hamartoma.  No suspicious findings to suggest malignancy within the neck, chest, abdomen or pelvis are identified on today's examination.   Original Report Authenticated By: Trudie Reed, M.D.   Dg Chest Port 1 View  06/06/2013   *RADIOLOGY REPORT*  Clinical Data: Cough; heavy breathing.  Recent cardiac surgery.  PORTABLE CHEST - 1 VIEW  Comparison: Chest radiograph performed 06/05/2013  Findings: Retrocardiac airspace opacification is again noted; this may reflect postoperative atelectasis.  Minimal right basilar opacity likely also reflects atelectasis.  Previous noted lines and tubes have been removed, aside from a right IJ sheath, seen ending overlying the proximal SVC.  No definite pleural effusion or pneumothorax is seen.  The cardiomediastinal silhouette is borderline enlarged.  The patient is status post median sternotomy, with evidence of prior CABG.  An aortic valve replacement is noted.  Calcification is noted within the aortic arch.  No acute osseous abnormalities are identified.  IMPRESSION:  1.  Persistent retrocardiac airspace opacification and minimal right basilar opacity likely reflect postoperative atelectasis. 2.  Borderline cardiomegaly.   Original  Report Authenticated By: Tonia Ghent, M.D.   Dg Chest Portable 1 View In Am  06/05/2013   *RADIOLOGY REPORT*  Clinical Data: Postop.  PORTABLE CHEST - 1 VIEW  Comparison:  Chest x-ray from yesterday.  Findings:   Status post tracheal esophageal extubation.  Swan-Ganz catheter by right IJ approach continues to terminate in the region of the right pulmonary artery. Thoracic drains in similar position.  Unchanged cardiopericardial enlargement. Status post aortic valve replacement and CABG. Persistent retrocardiac opacity.  Haziness at the right base is likely postoperative atelectasis.  No significant pneumothorax.  IMPRESSION:  1.  Unchanged left more than right base opacities, likely postoperative atelectasis. 2.  Remaining support apparatus in unremarkable position, as above.   Original Report Authenticated By: Tiburcio Pea   Dg Chest Portable 1 View  06/04/2013   *  RADIOLOGY REPORT*  Clinical Data: Post CABG  PORTABLE CHEST - 1 VIEW  Comparison: Portable exam 1342 hours compared to 06/01/2013  Findings: Tip of endotracheal tube projects 6.0 cm above carina. Nasogastric tube extends into stomach. Right jugular Swan-Ganz catheter tip projects over proximal right pulmonary artery. Enlargement of cardiac silhouette post CABG and AVR. Atherosclerotic calcification aorta. Mediastinal contours and pulmonary vascularity normal. Atelectasis left lower lobe. Minimal linear subsegmental atelectasis at minor fissure. No acute infiltrate or pneumothorax. Bones unremarkable.  IMPRESSION: Postoperative changes as above.   Original Report Authenticated By: Ulyses Southward, M.D.   Ct Angio Chest Aorta W/cm &/or Wo/cm  05/20/2013   *RADIOLOGY REPORT*  Clinical Data:  22-YEAR-OLD MALE WITH SEVERE AORTIC STENOSIS. PREPROCEDURAL STUDY FOR POTENTIAL TRANSCATHETER AORTIC VALVE REPLACEMENT (TAVR).  CT ANGIOGRAPHY CHEST, ABDOMEN AND PELVIS  Technique:  Multidetector CT imaging through the chest, abdomen and pelvis was performed using  the standard protocol during bolus administration of intravenous contrast.  Multiplanar reconstructed images including MIPs were obtained and reviewed to evaluate the vascular anatomy.  Contrast: OMNIPAQUE IOHEXOL 350 MG/ML SOLN  Comparison:  PET CT 07/14/2009.  CTA CHEST  Findings:  Mediastinum: Heart size is mildly enlarged, and apex of the heart is displaced laterally related to the patient's pectus excavatum. Trace amount of pericardial fluid and/or thickening, unlikely to be of hemodynamic significance at this time. There is atherosclerosis of the thoracic aorta, the great vessels of the mediastinum and the coronary arteries, including calcified atherosclerotic plaque in the left main, left anterior descending, left circumflex and right coronary arteries. Marked thickening and calcification of the aortic valve, compatible with the given clinical diagnosis of aortic stenosis.  Minimal diameter of the right subclavian artery occurrs shortly after its origin (7.7 mm).  Minimal diameter of the left subclavian artery is in the mid vessel where it measures approximately 7 mm in diameter.  Ectasia of the ascending thoracic aorta which measures up to 4.2 x 4.0 cm.  The thoracic aortic arch measures up to 3.0 cm in diameter, and the descending thoracic aorta measures 2.7 cm in diameter. No pathologically enlarged mediastinal or hilar lymph nodes. Esophagus is unremarkable in appearance.  Lungs/Pleura: Previously noted right upper lobe nodule has increased slightly in size, currently measuring 1.5 x 1.4 cm (image 91 of series 5).  This is macrolobulated with subtle surrounding spiculations, abutting the pleura anteriorly and inferiorly (this is immediately adjacent to the minor fissure).  Areas of mild dependent atelectasis are noted in the lower lobes of the lungs bilaterally.  No acute consolidative airspace disease.  No significant pleural effusions.  Musculoskeletal: Pectus excavatum. There are no aggressive  appearing lytic or blastic lesions noted in the visualized portions of the skeleton.   Review of the MIP images confirms the above findings.  IMPRESSION: 1. Interval enlargement of what is now a 1.5 x 1.4 cm macrolobulated nodule in the inferior aspect of the right upper lobe abutting the minor fissure.  This has subtle spiculated margins and is concerning for a slow-growing neoplasm such as a low grade adenocarcinoma (which may account for lack of hypermetabolic activity on prior PET examination).  Consideration for CT guided biopsy to exclude malignancy is recommended if clinically indicated. 2.  Right and left subclavian arteries measure approximately 7.7 and 7.0 cm in diameter respectively. 3. Atherosclerosis, including left main and three-vessel coronary artery disease. In addition, there is mild ectasia of the ascending thoracic aorta (4.2 x 4.0 cm). 4.  Mild cardiomegaly. 5.  Pectus excavatum.  Because of this, the apex of the left ventricle was laterally displaced all located between the anterior aspects of the left sixth and seventh ribs on today's examination. This may have complications for attempted transapical placement of TAVR device.  CTA ABDOMEN AND PELVIS  VASCULAR MEASUREMENTS PERTINENT TO TAVR:  AORTA:  Minimal Aortic Diameter - 18 x 18 mm in the infrarenal abdominal aorta  Severity of Aortic Calcification -  mild  RIGHT PELVIS:  Right Common Iliac Artery -  Minimal Diameter - 10.2 x 8.5 mm  Tortuosity - moderate  Calcification - moderate  Right External Iliac Artery -  Minimal Diameter - 9.5 x 11.7 mm  Tortuosity - severe  Calcification - minimal  Right Common Femoral Artery -  Minimal Diameter - 9.2 x 9.5 mm  Tortuosity - mild  Calcification - mild  LEFT PELVIS:  Left Common Iliac Artery -  Minimal Diameter - 11.0 x 12.5 mm  Tortuosity - mild  Calcification - moderate  Left External Iliac Artery -  Minimal Diameter - 9.3 x 8.9 mm  Tortuosity - severe  Calcification - minimal  Left Common Femoral  Artery -  Minimal Diameter - 9.5 x 8.7 mm  Tortuosity - mild  Calcification - mild  Other Findings:  Abdomen/Pelvis:  The appearance of the liver, gallbladder, pancreas, spleen, right adrenal gland and the left kidney is unremarkable.  Mild nodular thickening of the left adrenal gland is noted, but is unchanged compared to prior examinations and presumably benign.  Several subcentimeter low attenuation lesions in the right kidney are too small to definitively characterize, but favored to represent small cysts.  The prostate gland is markedly enlarged measuring approximately 5.8 x 7.3 cm.  Trace volume of ascites in the pelvis is of uncertain etiology and significance, but is unusual in a male patient.  Numerous colonic diverticula are noted, particularly in the region of the sigmoid colon, without surrounding inflammatory changes to suggest acute diverticulitis at this time.  No pneumoperitoneum.  No pathologic distension of small bowel.  No definite pathologic lymphadenopathy identified within the abdomen or pelvis.  Postoperative changes of mesh repair for presumed hernia in the lower left anterior abdominal wall.  Musculoskeletal: There are no aggressive appearing lytic or blastic lesions noted in the visualized portions of the skeleton.   Review of the MIP images confirms the above findings.  IMPRESSION: 1.  Vascular findings and measurements pertinent to potential TAVR procedure, as discussed above.  Although the pelvic arteries are large enough for pelvic arterial access for the procedure, the severe tortuosity of the external iliac arteries should be noted as this may have implications for potential pelvic arterial access. 2.  Severely enlarged prostate gland. 3.  Mild nodularity of the left adrenal gland is unchanged compared to the prior study, and therefore favored to be benign. 4.  Trace volume of ascites in the anatomic pelvis is unusual in a male patient, but is of uncertain etiology and significance.  5.  Extensive colonic diverticulosis without findings to suggest acute diverticulitis at this time. 6.  Additional incidental findings, as above.   Original Report Authenticated By: Trudie Reed, M.D.   Ct Angio Abd/pel W/ And/or W/o  05/20/2013   *RADIOLOGY REPORT*  Clinical Data:  79-YEAR-OLD MALE WITH SEVERE AORTIC STENOSIS. PREPROCEDURAL STUDY FOR POTENTIAL TRANSCATHETER AORTIC VALVE REPLACEMENT (TAVR).  CT ANGIOGRAPHY CHEST, ABDOMEN AND PELVIS  Technique:  Multidetector CT imaging through the chest, abdomen and pelvis was performed using the standard protocol  during bolus administration of intravenous contrast.  Multiplanar reconstructed images including MIPs were obtained and reviewed to evaluate the vascular anatomy.  Contrast: OMNIPAQUE IOHEXOL 350 MG/ML SOLN  Comparison:  PET CT 07/14/2009.  CTA CHEST  Findings:  Mediastinum: Heart size is mildly enlarged, and apex of the heart is displaced laterally related to the patient's pectus excavatum. Trace amount of pericardial fluid and/or thickening, unlikely to be of hemodynamic significance at this time. There is atherosclerosis of the thoracic aorta, the great vessels of the mediastinum and the coronary arteries, including calcified atherosclerotic plaque in the left main, left anterior descending, left circumflex and right coronary arteries. Marked thickening and calcification of the aortic valve, compatible with the given clinical diagnosis of aortic stenosis.  Minimal diameter of the right subclavian artery occurrs shortly after its origin (7.7 mm).  Minimal diameter of the left subclavian artery is in the mid vessel where it measures approximately 7 mm in diameter.  Ectasia of the ascending thoracic aorta which measures up to 4.2 x 4.0 cm.  The thoracic aortic arch measures up to 3.0 cm in diameter, and the descending thoracic aorta measures 2.7 cm in diameter. No pathologically enlarged mediastinal or hilar lymph nodes. Esophagus is  unremarkable in appearance.  Lungs/Pleura: Previously noted right upper lobe nodule has increased slightly in size, currently measuring 1.5 x 1.4 cm (image 91 of series 5).  This is macrolobulated with subtle surrounding spiculations, abutting the pleura anteriorly and inferiorly (this is immediately adjacent to the minor fissure).  Areas of mild dependent atelectasis are noted in the lower lobes of the lungs bilaterally.  No acute consolidative airspace disease.  No significant pleural effusions.  Musculoskeletal: Pectus excavatum. There are no aggressive appearing lytic or blastic lesions noted in the visualized portions of the skeleton.   Review of the MIP images confirms the above findings.  IMPRESSION: 1. Interval enlargement of what is now a 1.5 x 1.4 cm macrolobulated nodule in the inferior aspect of the right upper lobe abutting the minor fissure.  This has subtle spiculated margins and is concerning for a slow-growing neoplasm such as a low grade adenocarcinoma (which may account for lack of hypermetabolic activity on prior PET examination).  Consideration for CT guided biopsy to exclude malignancy is recommended if clinically indicated. 2.  Right and left subclavian arteries measure approximately 7.7 and 7.0 cm in diameter respectively. 3. Atherosclerosis, including left main and three-vessel coronary artery disease. In addition, there is mild ectasia of the ascending thoracic aorta (4.2 x 4.0 cm). 4.  Mild cardiomegaly. 5.  Pectus excavatum.  Because of this, the apex of the left ventricle was laterally displaced all located between the anterior aspects of the left sixth and seventh ribs on today's examination. This may have complications for attempted transapical placement of TAVR device.  CTA ABDOMEN AND PELVIS  VASCULAR MEASUREMENTS PERTINENT TO TAVR:  AORTA:  Minimal Aortic Diameter - 18 x 18 mm in the infrarenal abdominal aorta  Severity of Aortic Calcification -  mild  RIGHT PELVIS:  Right Common  Iliac Artery -  Minimal Diameter - 10.2 x 8.5 mm  Tortuosity - moderate  Calcification - moderate  Right External Iliac Artery -  Minimal Diameter - 9.5 x 11.7 mm  Tortuosity - severe  Calcification - minimal  Right Common Femoral Artery -  Minimal Diameter - 9.2 x 9.5 mm  Tortuosity - mild  Calcification - mild  LEFT PELVIS:  Left Common Iliac Artery -  Minimal Diameter -  11.0 x 12.5 mm  Tortuosity - mild  Calcification - moderate  Left External Iliac Artery -  Minimal Diameter - 9.3 x 8.9 mm  Tortuosity - severe  Calcification - minimal  Left Common Femoral Artery -  Minimal Diameter - 9.5 x 8.7 mm  Tortuosity - mild  Calcification - mild  Other Findings:  Abdomen/Pelvis:  The appearance of the liver, gallbladder, pancreas, spleen, right adrenal gland and the left kidney is unremarkable.  Mild nodular thickening of the left adrenal gland is noted, but is unchanged compared to prior examinations and presumably benign.  Several subcentimeter low attenuation lesions in the right kidney are too small to definitively characterize, but favored to represent small cysts.  The prostate gland is markedly enlarged measuring approximately 5.8 x 7.3 cm.  Trace volume of ascites in the pelvis is of uncertain etiology and significance, but is unusual in a male patient.  Numerous colonic diverticula are noted, particularly in the region of the sigmoid colon, without surrounding inflammatory changes to suggest acute diverticulitis at this time.  No pneumoperitoneum.  No pathologic distension of small bowel.  No definite pathologic lymphadenopathy identified within the abdomen or pelvis.  Postoperative changes of mesh repair for presumed hernia in the lower left anterior abdominal wall.  Musculoskeletal: There are no aggressive appearing lytic or blastic lesions noted in the visualized portions of the skeleton.   Review of the MIP images confirms the above findings.  IMPRESSION: 1.  Vascular findings and measurements pertinent to  potential TAVR procedure, as discussed above.  Although the pelvic arteries are large enough for pelvic arterial access for the procedure, the severe tortuosity of the external iliac arteries should be noted as this may have implications for potential pelvic arterial access. 2.  Severely enlarged prostate gland. 3.  Mild nodularity of the left adrenal gland is unchanged compared to the prior study, and therefore favored to be benign. 4.  Trace volume of ascites in the anatomic pelvis is unusual in a male patient, but is of uncertain etiology and significance. 5.  Extensive colonic diverticulosis without findings to suggest acute diverticulitis at this time. 6.  Additional incidental findings, as above.   Original Report Authenticated By: Trudie Reed, M.D.      ASSESSMENT: Atrial flutter with RVR, heart block  PLAN:  Start IV amiodarone.  Will hold transfer for now.  May be able to transfer later today if HR stabilizes.  Would not add AV nodal blocker at this time.    S/p AVR.  Overall doing well.  Foley being removed today.  Corky Crafts., MD  06/07/2013  9:49 AM

## 2013-06-07 NOTE — Progress Notes (Signed)
Report called to 2000 RN 

## 2013-06-08 LAB — BASIC METABOLIC PANEL
BUN: 30 mg/dL — ABNORMAL HIGH (ref 6–23)
CO2: 24 mEq/L (ref 19–32)
Calcium: 8.1 mg/dL — ABNORMAL LOW (ref 8.4–10.5)
Chloride: 96 mEq/L (ref 96–112)
Creatinine, Ser: 1.07 mg/dL (ref 0.50–1.35)
GFR calc Af Amer: 70 mL/min — ABNORMAL LOW (ref >90)
GFR calc non Af Amer: 60 mL/min — ABNORMAL LOW (ref >90)
Glucose, Bld: 115 mg/dL — ABNORMAL HIGH (ref 70–99)
Potassium: 3.7 mEq/L (ref 3.5–5.1)
Sodium: 129 mEq/L — ABNORMAL LOW (ref 135–145)

## 2013-06-08 LAB — CBC
HCT: 18.1 % — ABNORMAL LOW (ref 39.0–52.0)
Hemoglobin: 6.6 g/dL — CL (ref 13.0–17.0)
MCH: 34 pg (ref 26.0–34.0)
MCHC: 36.5 g/dL — ABNORMAL HIGH (ref 30.0–36.0)
MCV: 93.3 fL (ref 78.0–100.0)
Platelets: 97 10*3/uL — ABNORMAL LOW (ref 150–400)
RBC: 1.94 MIL/uL — ABNORMAL LOW (ref 4.22–5.81)
RDW: 14.1 % (ref 11.5–15.5)
WBC: 11.2 10*3/uL — ABNORMAL HIGH (ref 4.0–10.5)

## 2013-06-08 LAB — PREPARE RBC (CROSSMATCH)

## 2013-06-08 MED ORDER — FUROSEMIDE 10 MG/ML IJ SOLN
20.0000 mg | Freq: Once | INTRAMUSCULAR | Status: AC
  Administered 2013-06-08: 20 mg via INTRAVENOUS

## 2013-06-08 MED ORDER — SODIUM CHLORIDE 0.9 % IJ SOLN
10.0000 mL | INTRAMUSCULAR | Status: DC | PRN
  Administered 2013-06-09 – 2013-06-12 (×6): 10 mL
  Administered 2013-06-12: 20 mL
  Administered 2013-06-13: 10 mL
  Administered 2013-06-13: 20 mL
  Administered 2013-06-14 (×3): 10 mL
  Administered 2013-06-15: 40 mL

## 2013-06-08 MED ORDER — AMIODARONE HCL 200 MG PO TABS
400.0000 mg | ORAL_TABLET | Freq: Two times a day (BID) | ORAL | Status: DC
  Administered 2013-06-08 – 2013-06-11 (×8): 400 mg via ORAL
  Filled 2013-06-08 (×10): qty 2

## 2013-06-08 MED ORDER — POTASSIUM CHLORIDE CRYS ER 20 MEQ PO TBCR
30.0000 meq | EXTENDED_RELEASE_TABLET | Freq: Once | ORAL | Status: AC
  Administered 2013-06-08: 30 meq via ORAL
  Filled 2013-06-08: qty 1

## 2013-06-08 MED ORDER — POLYETHYLENE GLYCOL 3350 17 G PO PACK
17.0000 g | PACK | Freq: Once | ORAL | Status: AC
  Administered 2013-06-08: 17 g via ORAL
  Filled 2013-06-08: qty 1

## 2013-06-08 MED ORDER — FUROSEMIDE 10 MG/ML IJ SOLN
INTRAMUSCULAR | Status: AC
  Filled 2013-06-08: qty 4

## 2013-06-08 MED ORDER — FUROSEMIDE 10 MG/ML IJ SOLN: 20.0000 mg | Freq: Once | INTRAMUSCULAR | Status: DC

## 2013-06-08 NOTE — Progress Notes (Signed)
1430  instructed by Dr. Tyrone Sage to hold walk until pt gets blood transfusion. We will follow up tomorrow. Luetta Nutting RNBSN

## 2013-06-08 NOTE — Progress Notes (Signed)
Received critical lab value Hgb 6.6, MD on call notified. MD gave verbal order to type and screen, no further orders given. Will continue to monitor.    Donnalynn Wheeless M

## 2013-06-08 NOTE — Progress Notes (Signed)
Talked with Joseph Garner and his daughter about the PICC placement. Reviewed risk and benefits. All questions were answered and the patient is agreeable to having the PICC placed.

## 2013-06-08 NOTE — Progress Notes (Signed)
Subjective:  No new complaints, no BM. No bleeding. Alert.   Objective:  Vital Signs in the last 24 hours: Temp:  [97.4 F (36.3 C)-98.5 F (36.9 C)] 98.2 F (36.8 C) (08/12 0534) Pulse Rate:  [79-124] 87 (08/12 0534) Resp:  [13-24] 18 (08/12 0534) BP: (107-146)/(30-75) 146/68 mmHg (08/12 0534) SpO2:  [91 %-98 %] 97 % (08/12 0534) Weight:  [76.4 kg (168 lb 6.9 oz)] 76.4 kg (168 lb 6.9 oz) (08/12 0500)  Intake/Output from previous day: 08/11 0701 - 08/12 0700 In: 952.8 [P.O.:600; I.V.:252.8; IV Piggyback:100] Out: 960 [Urine:960]   Physical Exam: General: Elderly, well nourished, in no acute distress. Head:  Normocephalic and atraumatic. Lungs: Clear to auscultation and percussion. Scar healing.  Heart: Normal S1 and S2. Soft SM, rubs or gallops.  Abdomen: soft, non-tender, positive bowel sounds. Extremities: No clubbing or cyanosis. No edema. Neurologic: Alert and oriented x 3.    Lab Results:  Recent Labs  06/07/13 0400 06/08/13 0536  WBC 11.8* 11.2*  HGB 7.1* 6.6*  PLT 77* 97*    Recent Labs  06/07/13 0400 06/08/13 0536  NA 129* 129*  K 3.5 3.7  CL 97 96  CO2 23 24  GLUCOSE 106* 115*  BUN 35* 30*  CREATININE 1.29 1.07   Telemetry: NSR, 1st degree AVB, PAC's. No AFLUTTER,  Personally viewed.    Assessment/Plan:  Principal Problem:   Acute diastolic CHF (congestive heart failure) Active Problems:   Pulmonary hamartoma- Right Lung   SVT (supraventricular tachycardia)   Hyposmolality and/or hyponatremia   Bioprosthetic aortic valve replacement during current hospitalization    - AFLUTTER  - improved on amio IV. Will change to PO.  - No brady noted. Pacer for backup  - low dose metoprolol.   - ANEMIA  - Hg in 6 range.  - Transfusion being considered.    - AVR   - Constipation    SKAINS, MARK 06/08/2013, 8:35 AM

## 2013-06-08 NOTE — Progress Notes (Signed)
CRITICAL VALUE ALERT  Critical value received:  Hgb 6.6  Date of notification:  06/08/2013  Time of notification:  615  Critical value read back:yes  Nurse who received alert:  Carlyle Lipa  MD notified (1st page):  Dr. Morton Peters  Time of first page:  0620  MD notified (2nd page):  Time of second page:  Responding MD:  Dr. Morton Peters  Time MD responded:  [phone]

## 2013-06-08 NOTE — Progress Notes (Signed)
Pt refused to take Lipitor this evening. MD made aware.

## 2013-06-08 NOTE — Progress Notes (Signed)
PICC RN here to place PICC. Awaiting PICC placement for blood transfusion. Will continue to monitor.

## 2013-06-08 NOTE — Progress Notes (Signed)
PICC team unable to place PICC, bilaterally. Midline placed by PICC team, MD paged and aware. Will continue to monitor.

## 2013-06-08 NOTE — Progress Notes (Signed)
PICC team aware of Pt's agreement to have PICC place. Awaiting PICC RN to place PICC. Vitals stable. Will continue to monitor.

## 2013-06-08 NOTE — Progress Notes (Signed)
Pt's daughter and wife present in room now. Explained the need for the PICC placement and reasons. Daughter states she wants to talk to father alone about need for PICC. Told family will return in 10 mins. Vitals stable.

## 2013-06-08 NOTE — Progress Notes (Signed)
PICC RN came by to place PICC, Pt refused to have PICC placed at this time. Pt states he wants to wait for his Wife and Daughter to come first before he decides to have PICC placed. Pt's Daughter, Nedra Hai contacted via phone. Pt's daughter states she is currently on her way to the hospital and will talk to her father. Pt's daughter instructed to call RN when she arrives in Room. Explained to pt the need for the PICC line and the importance of receiving the PICC line for blood IV Medication administration. Pt still declines PICC placement at this time, awaiting Pt's daughter and wife. Pt's VSS. Will continue to monitor.

## 2013-06-08 NOTE — Progress Notes (Signed)
PT Cancellation Note  Patient Details Name: Joseph Garner MRN: [id number] DOB: 08-01-1924   Cancelled Treatment:     Orders received to discontinue PT, please re-consult when appropriate.   Fabio Asa 06/08/2013, 8:30 AM

## 2013-06-08 NOTE — Progress Notes (Signed)
PICC team still in patient's room attempting PICC Placement. Will continue to monitor.

## 2013-06-08 NOTE — Progress Notes (Signed)
Attempted to place PICC in right arm basilic vein x 2.  Unable to thread guidewire x 2.  Moved to left side to assess veins, basilic vein too small but cephalic vein appropriate size.  Vein cannulated and guidewire threaded without problems but unable to thread PICC past clavicular level.  Second IV RN at bedside to assist in repositioning patient.  Still unable to pass PICC despite multiple repositioning attempts.  PICC pulled back to midline, bedside RN updated. Jaxsyn Catalfamo, Lajean Manes, RN IV team.

## 2013-06-08 NOTE — Progress Notes (Addendum)
      [address]       [place] [address]             [phone]        4 Days Post-Op Procedure(s) (LRB): AORTIC VALVE REPLACEMENT (AVR) (N/A) INTRAOPERATIVE TRANSESOPHAGEAL ECHOCARDIOGRAM (N/A) CORONARY ARTERY BYPASS GRAFTING (CABG) (N/A) WEDGE RESECTION Right Lower Lobe (Right)  Subjective: Patient had some discomfort in left thigh (swelling bruising secondary to [facility]) yesterday. Eating breakfast this am. No bowel movement yet.  Objective: Vital signs in last 24 hours: Temp:  [97.4 F (36.3 C)-98.5 F (36.9 C)] 98.2 F (36.8 C) (08/12 0534) Pulse Rate:  [79-124] 87 (08/12 0534) Cardiac Rhythm:  [-] Heart block (08/11 1950) Resp:  [13-24] 18 (08/12 0534) BP: (107-146)/(30-75) 146/68 mmHg (08/12 0534) SpO2:  [91 %-98 %] 97 % (08/12 0534) Weight:  [76.4 kg (168 lb 6.9 oz)] 76.4 kg (168 lb 6.9 oz) (08/12 0500)  Pre op weight  70 kg Current Weight  06/08/13 76.4 kg (168 lb 6.9 oz)      Intake/Output from previous day: 08/11 0701 - 08/12 0700 In: 952.8 [P.O.:600; I.V.:252.8; IV Piggyback:100] Out: 960 [Urine:960]   Physical Exam:  Cardiovascular: IRRR, systolic murmur Pulmonary: Clear to auscultation bilaterally; no rales, wheezes, or rhonchi. Abdomen: Soft, non tender, bowel sounds present. Extremities: Mild bilateral lower extremity edema. Ecchymosis left thigh Wounds: Clean and dry.  No erythema or signs of infection.  Lab Results: CBC: Recent Labs  06/07/13 0400 06/08/13 0536  WBC 11.8* 11.2*  HGB 7.1* 6.6*  HCT 19.6* 18.1*  PLT 77* 97*   BMET:  Recent Labs  06/07/13 0400 06/08/13 0536  NA 129* 129*  K 3.5 3.7  CL 97 96  CO2 23 24  GLUCOSE 106* 115*  BUN 35* 30*  CREATININE 1.29 1.07  CALCIUM 8.1* 8.1*    PT/INR:  Lab Results  Component Value Date   INR 1.27 06/07/2013   INR 1.55* 06/04/2013   INR 1.02 06/03/2013   ABG:  INR: Will add last result for INR, ABG once components are confirmed Will add last 4 CBG results  once components are confirmed  Assessment/Plan:  1. CV - Previous a flutter with RVR, pauses, transient heart block.SR, PVCs  this am. On Amiodarone gttp and Lopressor 12.5 bid. Per cardiology. 2.  Pulmonary - Encourage incentive spirometer 3. Volume Overload - On Lasix 40 daily 4.  Acute blood loss anemia - H and H down to 6.6 and 18.1. On hemocyte and folic acid. Will transfuse 5.Supplement potassium 6.CBGs 96/110/127. Pre op HGA1C 5.3. Stop glucose checks and sliding scale 7.Thrombocytopenia-platelets up to 97,000 8.LOC constipation  ZIMMERMAN,DONIELLE MPA-C 06/08/2013,7:27 AM  Had been trying to avoid transfusion but Hgb lower today and decision to transfuse at 7:30 am today PIC line for vascular access ordered yesterday (24 hours ago) still not placed. Blood ordered early this am before 7:30 am now 2:30 pm still no PIC line and blood not given. No notification that there were any problems in getting these orders carried out in a timely manner.   I have seen and examined [name] and agree with the above assessment  and plan.  Delight Ovens MD Beeper [phone] Office [phone] 06/08/2013 2:24 PM

## 2013-06-08 NOTE — Progress Notes (Signed)
Peripherally Inserted Central Catheter/Midline Placement  The IV Nurse has discussed with the patient and/or persons authorized to consent for the patient, the purpose of this procedure and the potential benefits and risks involved with this procedure.  The benefits include less needle sticks, lab draws from the catheter and patient may be discharged home with the catheter.  Risks include, but not limited to, infection, bleeding, blood clot (thrombus formation), and puncture of an artery; nerve damage and irregular heat beat.  Alternatives to this procedure were also discussed.  Patient preferred for wife to sign.  PICC/Midline Placement Documentation  PICC / Midline Double Lumen 06/08/13 Midline Left Cephalic (Active)       Joseph Garner, Joseph Garner 06/08/2013, 5:14 PM

## 2013-06-08 NOTE — Progress Notes (Signed)
CSW spoke with patient and family at bedside. Daughter of patient stated that their preference was [facility]. CSW spoke with [facility] who confirmed a bed when medically ready for d/c.  Maree Krabbe, MSW, Theresia Majors [phone]

## 2013-06-08 NOTE — Progress Notes (Signed)
Pt's daughter states she wants to speak with IV team concerning PICC placement questions and risks. IV team paged and aware. PICC RN made aware and to speak to Pt and family concerning PICC. Awaiting PICC RN. Vitals stable. Will continue to monitor.

## 2013-06-08 NOTE — Progress Notes (Signed)
IV team paged again concerning Pt's PICC line insertion. IV team aware. Vital signs stable, Will continue to monitor.

## 2013-06-09 ENCOUNTER — Inpatient Hospital Stay (HOSPITAL_COMMUNITY): Payer: Medicare Other

## 2013-06-09 ENCOUNTER — Inpatient Hospital Stay (HOSPITAL_COMMUNITY)
Admission: RE | Admit: 2013-06-09 | Payer: Medicare Other | Source: Ambulatory Visit | Admitting: Thoracic Surgery (Cardiothoracic Vascular Surgery)

## 2013-06-09 LAB — CBC
MCHC: 37 g/dL — ABNORMAL HIGH (ref 30.0–36.0)
MCV: 90.7 fL (ref 78.0–100.0)
Platelets: 101 10*3/uL — ABNORMAL LOW (ref 150–400)
RDW: 15.3 % (ref 11.5–15.5)
WBC: 8.7 10*3/uL (ref 4.0–10.5)

## 2013-06-09 LAB — BASIC METABOLIC PANEL
Chloride: 94 mEq/L — ABNORMAL LOW (ref 96–112)
Creatinine, Ser: 1.06 mg/dL (ref 0.50–1.35)
GFR calc Af Amer: 71 mL/min — ABNORMAL LOW (ref >90)
GFR calc non Af Amer: 61 mL/min — ABNORMAL LOW (ref >90)

## 2013-06-09 LAB — PREPARE RBC (CROSSMATCH)

## 2013-06-09 MED ORDER — POTASSIUM CHLORIDE CRYS ER 20 MEQ PO TBCR
30.0000 meq | EXTENDED_RELEASE_TABLET | Freq: Once | ORAL | Status: AC
  Administered 2013-06-09: 30 meq via ORAL
  Filled 2013-06-09: qty 1

## 2013-06-09 MED ORDER — POTASSIUM CHLORIDE CRYS ER 20 MEQ PO TBCR
20.0000 meq | EXTENDED_RELEASE_TABLET | Freq: Every day | ORAL | Status: DC
  Filled 2013-06-09 (×2): qty 1
  Filled 2013-06-09: qty 2

## 2013-06-09 NOTE — Progress Notes (Signed)
Patient ambulated 350 feet in the hallway with RW. Patient tolerated well. Returned patient to bed. Will continue to monitor closely. Lajuana Matte, RN

## 2013-06-09 NOTE — Progress Notes (Addendum)
      [address]       [place] [address]             [phone]        5 Days Post-Op Procedure(s) (LRB): AORTIC VALVE REPLACEMENT (AVR) (N/A) INTRAOPERATIVE TRANSESOPHAGEAL ECHOCARDIOGRAM (N/A) CORONARY ARTERY BYPASS GRAFTING (CABG) (N/A) WEDGE RESECTION Right Lower Lobe (Right)  Subjective: Patient had PICC line placed yesterday. He has no complaints this am.  Objective: Vital signs in last 24 hours: Temp:  [98.1 F (36.7 C)-98.9 F (37.2 C)] 98.2 F (36.8 C) (08/13 0424) Pulse Rate:  [80-88] 84 (08/13 0424) Cardiac Rhythm:  [-] Heart block (08/13 0424) Resp:  [17-18] 18 (08/13 0424) BP: (109-169)/(51-80) 169/80 mmHg (08/13 0424) SpO2:  [94 %-96 %] 94 % (08/13 0424) Weight:  [75.6 kg (166 lb 10.7 oz)] 75.6 kg (166 lb 10.7 oz) (08/13 0424)  Pre op weight  70 kg Current Weight  06/09/13 75.6 kg (166 lb 10.7 oz)      Intake/Output from previous day: 08/12 0701 - 08/13 0700 In: 822.5 [P.O.:360; I.V.:250; Blood:212.5] Out: 1000 [Urine:1000]   Physical Exam:  Cardiovascular: RRR, systolic murmur Pulmonary: Clear to auscultation bilaterally; no rales, wheezes, or rhonchi. Abdomen: Soft, non tender, bowel sounds present. Extremities: Mild bilateral lower extremity edema. Ecchymosis left thigh Wounds: Clean and dry.  No erythema or signs of infection.  Lab Results: CBC:  Recent Labs  06/08/13 0536 06/09/13 0530  WBC 11.2* 8.7  HGB 6.6* 7.6*  HCT 18.1* 20.5*  PLT 97* 101*   BMET:   Recent Labs  06/08/13 0536 06/09/13 0530  NA 129* 128*  K 3.7 3.3*  CL 96 94*  CO2 24 27  GLUCOSE 115* 102*  BUN 30* 22  CREATININE 1.07 1.06  CALCIUM 8.1* 7.8*    PT/INR:  Lab Results  Component Value Date   INR 1.27 06/07/2013   INR 1.55* 06/04/2013   INR 1.02 06/03/2013   ABG:  INR: Will add last result for INR, ABG once components are confirmed Will add last 4 CBG results once components are confirmed  Assessment/Plan:  1. CV -  Hypertensive. Previous a flutter with RVR, pauses, transient heart block. On backup pacer at 50.On Amiodarone 400 bid and Lopressor 12.5 bid. Per cardiology. 2.  Pulmonary - Encourage incentive spirometer 3. Volume Overload - On Lasix 40 daily 4.  Acute blood loss anemia - H and H up to 7.6 and 20.5 . Transfused yesterday. On hemocyte and folic acid.  5.Supplement potassium 6.Thrombocytopenia-platelets up to 101,000 7.Regarding Lipitor, patient refused last evening because he did not understand why he was on it. I talked with him this am and he is agreeable to take it. 8.Will likely need SNF when ready for discharge.  ZIMMERMAN,DONIELLE MPA-C 06/09/2013,7:57 AM  Feels better today Will give second unit PRBC today I have seen and examined [name] Life and agree with the above assessment  and plan.  Delight Ovens MD Beeper [phone] Office [phone] 06/09/2013 10:00 AM

## 2013-06-09 NOTE — Progress Notes (Addendum)
Subjective:  Did not wish to take Lipitor last night. Talk to him about this again. With coronary artery disease, encouraged. PICC line placed yesterday. Transfusion performed. Feels a little better this morning he states.  Objective:  Vital Signs in the last 24 hours: Temp:  [98.1 F (36.7 C)-98.9 F (37.2 C)] 98.2 F (36.8 C) (08/13 0424) Pulse Rate:  [80-88] 84 (08/13 0424) Resp:  [17-18] 18 (08/13 0424) BP: (109-169)/(51-80) 169/80 mmHg (08/13 0424) SpO2:  [94 %-96 %] 94 % (08/13 0424) Weight:  [75.6 kg (166 lb 10.7 oz)] 75.6 kg (166 lb 10.7 oz) (08/13 0424)  Intake/Output from previous day: 08/12 0701 - 08/13 0700 In: 822.5 [P.O.:360; I.V.:250; Blood:212.5] Out: 1000 [Urine:1000]   Physical Exam: General: Well developed, well nourished, in no acute distress. Head:  Normocephalic and atraumatic. Lungs: Clear to auscultation and percussion. Heart: Normal S1 and S2.  Occasional ectopy No murmur, rubs or gallops. Scar well healing. Abdomen: soft, non-tender, positive bowel sounds. Extremities: No clubbing or cyanosis. No edema. Neurologic: Alert and oriented x 3.    Lab Results:  Recent Labs  06/08/13 0536 06/09/13 0530  WBC 11.2* 8.7  HGB 6.6* 7.6*  PLT 97* 101*    Recent Labs  06/08/13 0536 06/09/13 0530  NA 129* 128*  K 3.7 3.3*  CL 96 94*  CO2 24 27  GLUCOSE 115* 102*  BUN 30* 22  CREATININE 1.07 1.06   Telemetry: Sinus rhythm with first degree block, occasional ventricular paced beat, no further SVT/atrial flutter. Personally viewed.   Assessment/Plan:  Principal Problem:   Acute diastolic CHF (congestive heart failure) Active Problems:   Pulmonary hamartoma- Right Lung   SVT (supraventricular tachycardia)   Hyposmolality and/or hyponatremia   Bioprosthetic aortic valve replacement during current hospitalization  1. Atrial flutter/SVT-currently stable on amiodarone 400 mg by mouth twice a day as well as low-dose metoprolol. Continue. Hopefully he  will be able to have his temporary pacemaker leads removed soon.  2. Hyperlipidemia-encouraged use of atorvastatin 20 mg. Explained to him that this is important as secondary prevention given his coronary artery disease. He has not taken statin in the past and he is not sure about taking this medication. Encouragement.  3. Anemia-improved post transfusion. PICC line.  4. Aortic valve replacement-bioprosthetic. Single-vessel bypass to diagonal branch.  Agree with low-dose Lasix.  SKAINS, MARK 06/09/2013, 8:39 AM

## 2013-06-09 NOTE — Progress Notes (Addendum)
CARDIAC REHAB PHASE I   PRE:  Rate/Rhythm: 80 SR    BP: lying 127/75, sitting 137/70, standing 132/79    SaO2: 100 2L, 97 2L standing  MODE:  Ambulation: 150 ft   POST:  Rate/Rhythm: 92 SR    BP: sitting 136/87     SaO2: 94 2L  Pt c/o some SOB upon entering room. Slightly anxious, RN took vitals. Sat pt on EOB and felt slightly better. Able to walk 150 ft with RW, gait belt, 2L O2 and assist x2. Steady, good steps. No overt SOB. Really did well. Requested to return to bed. Wants to walk later this pm. Pt needs PT order replaced. [id number]   Elissa Lovett [place] CES, ACSM 06/09/2013 1:52 PM

## 2013-06-10 ENCOUNTER — Inpatient Hospital Stay (HOSPITAL_COMMUNITY): Payer: Medicare Other

## 2013-06-10 LAB — TYPE AND SCREEN
ABO/RH(D): O NEG
Antibody Screen: NEGATIVE
Unit division: 0
Unit division: 0

## 2013-06-10 LAB — CBC
HCT: 23.8 % — ABNORMAL LOW (ref 39.0–52.0)
Hemoglobin: 8.7 g/dL — ABNORMAL LOW (ref 13.0–17.0)
MCH: 32.7 pg (ref 26.0–34.0)
MCHC: 36.6 g/dL — ABNORMAL HIGH (ref 30.0–36.0)
MCV: 89.5 fL (ref 78.0–100.0)
Platelets: 117 10*3/uL — ABNORMAL LOW (ref 150–400)
RBC: 2.66 MIL/uL — ABNORMAL LOW (ref 4.22–5.81)
RDW: 16.4 % — ABNORMAL HIGH (ref 11.5–15.5)
WBC: 9 10*3/uL (ref 4.0–10.5)

## 2013-06-10 LAB — BASIC METABOLIC PANEL
BUN: 18 mg/dL (ref 6–23)
CO2: 24 mEq/L (ref 19–32)
Calcium: 7.8 mg/dL — ABNORMAL LOW (ref 8.4–10.5)
Chloride: 93 mEq/L — ABNORMAL LOW (ref 96–112)
Creatinine, Ser: 0.98 mg/dL (ref 0.50–1.35)
GFR calc Af Amer: 83 mL/min — ABNORMAL LOW (ref >90)
GFR calc non Af Amer: 72 mL/min — ABNORMAL LOW (ref >90)
Glucose, Bld: 104 mg/dL — ABNORMAL HIGH (ref 70–99)
Potassium: 3.6 mEq/L (ref 3.5–5.1)
Sodium: 126 mEq/L — ABNORMAL LOW (ref 135–145)

## 2013-06-10 MED ORDER — POTASSIUM CHLORIDE CRYS ER 20 MEQ PO TBCR
40.0000 meq | EXTENDED_RELEASE_TABLET | Freq: Once | ORAL | Status: AC
  Administered 2013-06-10: 40 meq via ORAL

## 2013-06-10 MED ORDER — POTASSIUM CHLORIDE CRYS ER 20 MEQ PO TBCR: 30.0000 meq | EXTENDED_RELEASE_TABLET | Freq: Once | ORAL | Status: DC

## 2013-06-10 NOTE — Progress Notes (Signed)
CARDIAC REHAB PHASE I   PRE:  Rate/Rhythm: 80SR  BP:  Supine:   Sitting: 123/66  Standing:    SaO2: 95%RA  MODE:  Ambulation: 150 ft   POST:  Rate/Rhythm: 85SR  BP:  Supine:   Sitting: 118/67  Standing:    SaO2: 98%RA 1252-1324 Pt walked 150 ft on RA with rolling walker, gait belt and asst x 1.  Gait fairly steady. C/o lightheadedness during walk which he said he had with walk this morning too. This was third walk today. Wants to walk again before bedtime. To bed after walk. Call bell in reach.    Luetta Nutting, RN BSN  06/10/2013 1:20 PM

## 2013-06-10 NOTE — Progress Notes (Addendum)
      [address]       [place] [address]             [phone]        6 Days Post-Op Procedure(s) (LRB): AORTIC VALVE REPLACEMENT (AVR) (N/A) INTRAOPERATIVE TRANSESOPHAGEAL ECHOCARDIOGRAM (N/A) CORONARY ARTERY BYPASS GRAFTING (CABG) (N/A) WEDGE RESECTION Right Lower Lobe (Right)  Subjective: He has no complaints this am.  Objective: Vital signs in last 24 hours: Temp:  [97.2 F (36.2 C)-98.4 F (36.9 C)] 97.7 F (36.5 C) (08/14 0429) Pulse Rate:  [78-88] 83 (08/14 0429) Cardiac Rhythm:  [-] Heart block (08/14 0429) Resp:  [16-18] 18 (08/14 0429) BP: (103-153)/(57-99) 153/84 mmHg (08/14 0429) SpO2:  [94 %-98 %] 94 % (08/14 0429) Weight:  [77 kg (169 lb 12.1 oz)] 77 kg (169 lb 12.1 oz) (08/14 0429)  Pre op weight  70 kg Current Weight  06/10/13 77 kg (169 lb 12.1 oz)      Intake/Output from previous day: 08/13 0701 - 08/14 0700 In: 858 [P.O.:580; I.V.:3; Blood:275] Out: 1700 [Urine:1700]   Physical Exam:  Cardiovascular: RRR Pulmonary: Clear to auscultation bilaterally; no rales, wheezes, or rhonchi. Abdomen: Soft, non tender, bowel sounds present. Extremities: Mild bilateral lower extremity edema. Ecchymosis left thigh Wounds: Clean and dry.  No erythema or signs of infection.  Lab Results: CBC:  Recent Labs  06/09/13 0530 06/10/13 0515  WBC 8.7 9.0  HGB 7.6* 8.7*  HCT 20.5* 23.8*  PLT 101* 117*   BMET:   Recent Labs  06/09/13 0530 06/10/13 0515  NA 128* 126*  K 3.3* 3.6  CL 94* 93*  CO2 27 24  GLUCOSE 102* 104*  BUN 22 18  CREATININE 1.06 0.98  CALCIUM 7.8* 7.8*    PT/INR:  Lab Results  Component Value Date   INR 1.27 06/07/2013   INR 1.55* 06/04/2013   INR 1.02 06/03/2013   ABG:  INR: Will add last result for INR, ABG once components are confirmed Will add last 4 CBG results once components are confirmed  Assessment/Plan:  1. CV - Hypertensive. Previous a flutter with RVR, pauses, transient heart block. SR  this am.On backup pacer at 50, which can likely be stopped.On Amiodarone 400 bid and Lopressor 12.5 bid. Per cardiology. 2.  Pulmonary - CXR this appears to show no pneumothorax, cardiomegaly, left pleural effusion and atelectasis.Encourage incentive spirometer 3. Volume Overload - Given low dose Lasix yesterday 4.  Acute blood loss anemia - H and H up to 8.7 and 23.8. Transfused again yesterday. On hemocyte and folic acid.  5.Supplement potassium 6.Thrombocytopenia-platelets up to 117,000 7.Hyponatremia-sodium is 126. Possibly related to diuresis 8.Supplement potassium 9.Will likely need SNF when ready for discharge.  ZIMMERMAN,DONIELLE MPA-C 06/10/2013,7:38 AM   See previous notes- avoiding heparin because of thrombocytopenia which has improved but still low continue pas hose  I have seen and examined [name] Life and agree with the above assessment  and plan.  Delight Ovens MD Beeper [phone] Office [phone] 06/10/2013 9:09 AM

## 2013-06-10 NOTE — Progress Notes (Signed)
Subjective:  Doing well. No complaints. No CP, no SOB. Eager to get up.   Objective:  Vital Signs in the last 24 hours: Temp:  [97.2 F (36.2 C)-98.4 F (36.9 C)] 97.7 F (36.5 C) (08/14 0429) Pulse Rate:  [78-88] 83 (08/14 0429) Resp:  [16-18] 18 (08/14 0429) BP: (103-153)/(57-99) 153/84 mmHg (08/14 0429) SpO2:  [94 %-98 %] 94 % (08/14 0429) Weight:  [77 kg (169 lb 12.1 oz)] 77 kg (169 lb 12.1 oz) (08/14 0429)  Intake/Output from previous day: 08/13 0701 - 08/14 0700 In: 858 [P.O.:580; I.V.:3; Blood:275] Out: 1700 [Urine:1700]   Physical Exam: General: Well developed, well nourished, in no acute distress. Head:  Normocephalic and atraumatic. Lungs: Clear to auscultation and percussion. Heart: Normal S1 and S2.  No murmur, rubs or gallops.  Abdomen: soft, non-tender, positive bowel sounds. Neurologic: Alert and oriented x 3.    Lab Results:  Recent Labs  06/09/13 0530 06/10/13 0515  WBC 8.7 9.0  HGB 7.6* 8.7*  PLT 101* 117*    Recent Labs  06/09/13 0530 06/10/13 0515  NA 128* 126*  K 3.3* 3.6  CL 94* 93*  CO2 27 24  GLUCOSE 102* 104*  BUN 22 18  CREATININE 1.06 0.98     Telemetry: No AFLUTTER Personally viewed.   Assessment/Plan:  Principal Problem:   Acute diastolic CHF (congestive heart failure) Active Problems:   Pulmonary hamartoma- Right Lung   SVT (supraventricular tachycardia)   Hyposmolality and/or hyponatremia   Bioprosthetic aortic valve replacement during current hospitalization   -Doing well on amio -Continue with low dose metop -Hopeful DC pacing wires   Joseph Garner 06/10/2013, 8:54 AM

## 2013-06-10 NOTE — Progress Notes (Signed)
CSW continuing to follow patient. [facility] still confirmed for dc plan.  Maree Krabbe, MSW, Theresia Majors [phone]

## 2013-06-10 NOTE — Evaluation (Signed)
Physical Therapy Evaluation Patient Details Name: Joseph Garner MRN: [id number] DOB: 17-Apr-1924 Today's Date: 06/10/2013 Time: 1100-1117 PT Time Calculation (min): 17 min  PT Assessment / Plan / Recommendation History of Present Illness  79 y.o. male admitted to [facility] 05/31/13 with worsening SOB and DOE.  He was scheduled to have an aortic valave replacement next week.  Cardiac surgery is looking to move the surgery forward as the pt's symptoms have become more emergent and severe.    Clinical Impression  Patient received for re-evaluation orders. Patient continues to demonstrate deficits in mobility as indicated below. With limited progress since initial evaluation, and patients current levels of assist needed, feel patient will benefit from ST SNF prior to dc home. Will continue to see as indicated and progress activity as tolerated.    PT Assessment  Patient needs continued PT services    Follow Up Recommendations  SNF          Equipment Recommendations  None recommended by PT    Recommendations for Other Services     Frequency Min 4X/week    Precautions / Restrictions Precautions Precautions: Fall Precaution Comments: Monitor HR, DOE, and O2 sats with mobility Restrictions Weight Bearing Restrictions: No   Pertinent Vitals/Pain 3/10 pain, dizziness reported with ambulation      Mobility  Bed Mobility Bed Mobility: not assessed Transfers  Transfers: Sit to Stand;Stand to Sit Sit to Stand: 4: Min assist Stand to Sit: 4: Min assist Details for Transfer Assistance: VCs for sternal precautions and rocker tecnique Ambulation/Gait Ambulation/Gait Assistance: 4: Min assist Ambulation Distance (Feet): 160 Feet Assistive device: Rolling walker Ambulation/Gait Assistance Details: Light headed with ambulation, some assist for stability with fatigue, VCs for pacing Gait Pattern: Step-through pattern;Decreased stride length Gait velocity: decreased General Gait Details: very  slow gait        PT Diagnosis: Difficulty walking;Acute pain  PT Problem List: Decreased strength;Decreased activity tolerance;Decreased balance;Decreased mobility PT Treatment Interventions: DME instruction;Gait training;Stair training;Functional mobility training;Therapeutic activities;Therapeutic exercise;Balance training;Patient/family education     PT Goals(Current goals can be found in the care plan section) Acute Rehab PT Goals Patient Stated Goal: to get back to independent lifestyle PT Goal Formulation: With patient Time For Goal Achievement: 06/21/13 Potential to Achieve Goals: Fair  Visit Information  Last PT Received On: 06/10/13 Assistance Needed: +1 History of Present Illness: 79 y.o. male admitted to [facility] 05/31/13 with worsening SOB and DOE.  He was scheduled to have an aortic valave replacement next week.  Cardiac surgery is looking to move the surgery forward as the pt's symptoms have become more emergent and severe.         Prior Functioning  Home Living Family/patient expects to be discharged to:: Private residence Living Arrangements: Spouse/significant other Available Help at Discharge: Family;Available 24 hours/day Type of Home: House Home Access: Stairs to enter [employer] of Steps: 6 Entrance Stairs-Rails: Left (brick wall on right that he can use for support) Home Layout: One level Home Equipment: None;Other (comment) (reports that he might have a walker, but not sure) Prior Function Level of Independence: Independent Comments: was independent and walking any distance until his hernia surgery in April of 2014 since then he has limited ambulation which got significantly worse over the past week.   Communication Communication: HOH    Cognition  Cognition Arousal/Alertness: Awake/alert Behavior During Therapy: WFL for tasks assessed/performed Overall Cognitive Status: Within Functional Limits for tasks assessed    Extremity/Trunk Assessment  Cervical / Trunk Assessment Cervical /  Trunk Assessment: Normal   Balance Balance Balance Assessed: Yes High Level Balance High Level Balance Activites: Side stepping;Backward walking;Direction changes;Turns;Head turns High Level Balance Comments: Min guard for instability; VCs to increase speed and momentum for improved balance  End of Session PT - End of Session Equipment Utilized During Treatment: Gait belt Activity Tolerance: Patient limited by fatigue Patient left: in chair;with call bell/phone within reach Nurse Communication: Mobility status;Precautions  GP     Fabio Asa 06/10/2013, 1:59 PM Charlotte Crumb, PT DPT  [phone]

## 2013-06-11 NOTE — Progress Notes (Signed)
Physical Therapy Treatment Patient Details Name: Joseph Garner MRN: [id number] DOB: October 08, 1924 Today's Date: 06/11/2013 Time: 4098-1191 PT Time Calculation (min): 25 min  PT Assessment / Plan / Recommendation  History of Present Illness 79 y.o. male admitted to [facility] 05/31/13 with worsening SOB and DOE.  He was scheduled to have an aortic valave replacement next week.  Cardiac surgery is looking to move the surgery forward as the pt's symptoms have become more emergent and severe.     PT Comments   Patient educated at length regarding sternal precautions secondary to poor compliance. Instructed patient on technique for transfers using rocker method. Also spoke with patient regarding proper positioning.  Patient was able to increase ambulation and activity tolerance during today's session. Will continue to work with patient and progress activity as tolerated.  Follow Up Recommendations  SNF     Does the patient have the potential to tolerate intense rehabilitation     Barriers to Discharge        Equipment Recommendations  None recommended by PT    Recommendations for Other Services    Frequency Min 4X/week   Progress towards PT Goals    Plan Current plan remains appropriate    Precautions / Restrictions Precautions Precautions: Fall Precaution Comments: Monitor HR, DOE, and O2 sats with mobility Restrictions Weight Bearing Restrictions: No   Pertinent Vitals/Pain Patient reports no pain at this time.    Mobility  Bed Mobility Bed Mobility: Not assessed Transfers Transfers: Sit to Stand;Stand to Sit Sit to Stand: 4: Min assist Stand to Sit: 4: Min assist Details for Transfer Assistance: VCs for sternal precautions and rocker tecnique Ambulation/Gait Ambulation/Gait Assistance: 4: Min assist Ambulation Distance (Feet): 420 Feet Assistive device: Rolling walker Ambulation/Gait Assistance Details: gait speed minimally improved, ambulated approximately 20 ft without assistive  device Gait Pattern: Step-through pattern;Decreased stride length Gait velocity: decreased General Gait Details: very slow gait      PT Goals (current goals can now be found in the care plan section) Acute Rehab PT Goals Patient Stated Goal: to get back to independent lifestyle PT Goal Formulation: With patient Time For Goal Achievement: 06/21/13 Potential to Achieve Goals: Fair  Visit Information  Last PT Received On: 06/11/13 Assistance Needed: +1 History of Present Illness: 79 y.o. male admitted to [facility] 05/31/13 with worsening SOB and DOE.  He was scheduled to have an aortic valave replacement next week.  Cardiac surgery is looking to move the surgery forward as the pt's symptoms have become more emergent and severe.      Subjective Data  Subjective: I did well this morning Patient Stated Goal: to get back to independent lifestyle   Cognition  Cognition Arousal/Alertness: Awake/alert Behavior During Therapy: WFL for tasks assessed/performed Overall Cognitive Status: Within Functional Limits for tasks assessed    Balance  Balance Balance Assessed: Yes High Level Balance High Level Balance Activites: Side stepping;Backward walking;Direction changes;Turns;Head turns High Level Balance Comments: Min guard for instability; VCs to increase speed and momentum for improved balance  End of Session PT - End of Session Equipment Utilized During Treatment: Gait belt Activity Tolerance: Patient limited by fatigue Patient left: in bed;with call bell/phone within reach;with family/visitor present Nurse Communication: Mobility status;Precautions   GP     Fabio Asa 06/11/2013, 1:06 PM Charlotte Crumb, PT DPT  [phone]

## 2013-06-11 NOTE — Progress Notes (Signed)
Subjective:  Was a bit dizzy yesterday but now feels better. No CP, no SOB.  Objective:  Vital Signs in the last 24 hours: Temp:  [98 F (36.7 C)-98.4 F (36.9 C)] 98.4 F (36.9 C) (08/15 0428) Pulse Rate:  [73-85] 77 (08/15 0428) Resp:  [17-20] 20 (08/15 0428) BP: (118-137)/(67-72) 118/72 mmHg (08/15 0428) SpO2:  [93 %-94 %] 93 % (08/15 0428) Weight:  [77.1 kg (169 lb 15.6 oz)] 77.1 kg (169 lb 15.6 oz) (08/15 0428)  Intake/Output from previous day: 08/14 0701 - 08/15 0700 In: 360 [P.O.:360] Out: 500 [Urine:500]   Physical Exam: General: Well developed, well nourished, in no acute distress. Head:  Normocephalic and atraumatic. Lungs: Clear to auscultation and percussion. Heart: Normal S1 and S2.  No murmur, rubs or gallops.  Abdomen: soft, non-tender, positive bowel sounds. Extremities: No clubbing or cyanosis. No edema. Neurologic: Alert and oriented x 3.    Lab Results:  Recent Labs  06/09/13 0530 06/10/13 0515  WBC 8.7 9.0  HGB 7.6* 8.7*  PLT 101* 117*    Recent Labs  06/09/13 0530 06/10/13 0515  NA 128* 126*  K 3.3* 3.6  CL 94* 93*  CO2 27 24  GLUCOSE 102* 104*  BUN 22 18  CREATININE 1.06 0.98     Telemetry: No AFIB/FULTTER Personally viewed. Agree with NSR, first degree AVB   Assessment/Plan:  Principal Problem:   Acute diastolic CHF (congestive heart failure) Active Problems:   Pulmonary hamartoma- Right Lung   SVT (supraventricular tachycardia)   Hyposmolality and/or hyponatremia   Bioprosthetic aortic valve replacement during current hospitalization   1) SVT/AFLUTTER  - on discharge would decrease amiodarone to 200mg  PO BID. Will likely discontinue completely in a few weeks.   - Metop, low dose.   - Cont. ASA. No anticoag since no prolonged episode.   2) AVR  - progressing.   3) Anemia  - improved. S/p PRBC   Wise [employer]ees 06/11/2013, 8:40 AM

## 2013-06-11 NOTE — Discharge Summary (Signed)
[address]       [name] [address]             [phone]              Discharge Summary  Name: Joseph Garner DOB: [birth year] 79 y.o. MRN: [id number]   Admission Date: 05/31/2013 Discharge Date:     Admitting Diagnosis: Shortness of breath   Discharge Diagnosis:  Critical aortic stenosis Chronic systolic congestive heart failure Coronary artery disease Right middle lobe hamartoma Postoperative supraventricular tachycardia/atrial flutter Expected postoperative blood loss anemia Postoperative thrombocytopenia Acute postoperative renal insufficiency, resolved  Past Medical History  Diagnosis Date  . BPH (benign prostatic hypertrophy)   . Diverticulosis 2006  . Depression   . Anemia   . Spinal stenosis   . Lung nodules 2010  . Actinic keratosis, hx of   . Macular degeneration   . Hypertension   . Allergy     allergic rhinitis  . Hearing loss     wears bilateral hearing aids  . Inguinal hernia     left side -surgery planned  . GERD (gastroesophageal reflux disease)   . Arthritis     generalized-more back issues  . Severe aortic stenosis 04/16/2013  . Nodule of right lung 05/24/2013  . CHF (congestive heart failure)   . Shortness of breath   . Atrial flutter      Procedures: AORTIC VALVE REPLACEMENT (25 mm Edwards pericardial tissue valve) CORONARY ARTERY BYPASS GRAFTING x 1 (Saphenous vein graft to intermediate coronary artery) ENDOSCOPIC VEIN HARVEST LEFT THIGH RIGHT LOWER LOBE WEDGE RESECTION  - 06/04/2013    HPI:  The patient is a 79 y.o. male with a history of severe symptomatic aortic stenosis, single vessel CAD, and a right lung mass originally seen in consultation by Dr. Cornelius Moras on 04/27/2013 for consideration of aortic valve replacement.  He was scheduled for surgery on 06/09/2013, but in the interim, developed increasing shortness of breath with exertion, dull chest ache, dizziness and lightheadedness.   He presented to the  ER at [facility] on the date of this admission for evaluation and was noted to be in acute CHF.  He was subsequently admitted by cardiology for further management.    Hospital Course:  The patient was admitted to [facility] on 05/31/2013. He was started on diuretic therapy for his CHF.  He was seen by TCTS for consideration of more urgent surgical intervention.  Dr. Tyrone Sage reviewed his echo and catheterization and felt that in light of his worsening symptoms, he should proceed with AVR at this time, with possible CABG and right lung wedge resection.  All risks, benefits and alternatives of surgery were explained in detail, and the patient agreed to proceed. The patient was taken to the operating room and underwent the above procedure.    The postoperative course has been notable for variable rhythms.  He initially had some atrial fibrillation, and since had intermittent SVT, atrial flutter, and trasient heart block.  He was started on a low dose beta blocker and ACE-I.  Blood pressures have been fairly well controlled.  He has had a postoperative blood loss anemia, which did require transfusions of 2 units packed red blood cells.  He has had a mild thrombocytopenia, which was treated conservatively and is resolving. Lasix was continued for CHF/volume overload, and he is diuresing well. Incisions are healing well.  He has been ambulating with physical therapy and cardiac rehab, but is still quite  deconditioned.  He is tolerating a regular diet.  Pathology of the right middle lung lesion was positive for benign hamartoma.  Overall, he is progressing well.  He will need short term skilled nursing care post-discharge prior to returning home, and arrangements are being made by social work.  We plan transfer to SNF on Tuesday, 06/15/2013 provided he continues to progress.     Recent vital signs:  Filed Vitals:   06/11/13 1012  BP: 113/55  Pulse: 95  Temp:   Resp:     Recent laboratory studies:   CBC: Recent Labs  06/09/13 0530 06/10/13 0515  WBC 8.7 9.0  HGB 7.6* 8.7*  HCT 20.5* 23.8*  PLT 101* 117*   BMET:  Recent Labs  06/09/13 0530 06/10/13 0515  NA 128* 126*  K 3.3* 3.6  CL 94* 93*  CO2 27 24  GLUCOSE 102* 104*  BUN 22 18  CREATININE 1.06 0.98  CALCIUM 7.8* 7.8*    PT/INR: No results found for this basename: LABPROT, INR,  in the last 72 hours   Discharge Medications:      Medication List    STOP taking these medications       aspirin 81 MG tablet  Replaced by:  aspirin 325 MG EC tablet     meloxicam 15 MG tablet  Commonly known as:  MOBIC      TAKE these medications       amiodarone 200 MG tablet  Commonly known as:  PACERONE  Take 1 tablet (200 mg total) by mouth 2 (two) times daily.     aspirin 325 MG EC tablet  Take 1 tablet (325 mg total) by mouth daily.     atorvastatin 20 MG tablet  Commonly known as:  LIPITOR  Take 1 tablet (20 mg total) by mouth daily at 6 PM.     doxazosin 4 MG tablet  Commonly known as:  CARDURA  Take 4 mg by mouth at bedtime.     ferrous fumarate 325 (106 FE) MG Tabs tablet  Commonly known as:  HEMOCYTE - 106 mg FE  Take 1 tablet (106 mg of iron total) by mouth 2 (two) times daily.     fluticasone 50 MCG/ACT nasal spray  Commonly known as:  FLONASE  Place 2 sprays into the nose daily as needed for allergies.     folic acid 1 MG tablet  Commonly known as:  FOLVITE  Take 1 tablet (1 mg total) by mouth daily.     furosemide 40 MG tablet  Commonly known as:  LASIX  Take 1 tablet (40 mg total) by mouth daily.     loratadine 10 MG tablet  Commonly known as:  CLARITIN  Take 10 mg by mouth daily as needed for allergies.     metoprolol tartrate 12.5 mg Tabs tablet  Commonly known as:  LOPRESSOR  Take 0.5 tablets (12.5 mg total) by mouth 2 (two) times daily.     multivitamin with minerals Tabs tablet  Take 1 tablet by mouth daily.     potassium chloride SA 20 MEQ tablet  Commonly known as:   K-DUR,KLOR-CON  Take 1 tablet (20 mEq total) by mouth daily.     PRESERVISION AREDS PO  Take 2 capsules by mouth daily.     PRILOSEC OTC 20 MG tablet  Generic drug:  omeprazole  Take 20 mg by mouth daily.     solifenacin 5 MG tablet  Commonly known as:  VESICARE  Take 5  mg by mouth daily.     traMADol 50 MG tablet  Commonly known as:  ULTRAM  Take 1 tablet (50 mg total) by mouth every 6 (six) hours as needed.       The patient has been discharged on:   1.Beta Blocker:  Yes [ x  ]                              No   [   ]                              If No, reason:  2.Ace Inhibitor/ARB: Yes [   ]                                     No  [ x   ]                                     If No, reason: labile blood pressure  3.Statin:   Yes [x   ]                  No  [   ]                  If No, reason:  4.Ecasa:  Yes  [ x  ]                  No   [   ]                  If No, reason:      Discharge Instructions:  The patient is to refrain from driving, heavy lifting or strenuous activity.  May shower daily and clean incisions with soap and water.  May resume regular diet.   Follow Up: Follow-up Information   Follow up with GERHARDT,EDWARD B, MD In 3 weeks. (Office will call to arrange appointment)    Specialty:  Cardiothoracic Surgery   Contact information:   [address] [place] [address] [phone]       Follow up with Donato Schultz, MD. Schedule an appointment as soon as possible for a visit in 2 weeks.   Specialty:  Cardiology   Contact information:   [address] [place] [address] [phone]             COLLINS,GINA H 06/11/2013, 12:58 PM

## 2013-06-11 NOTE — Progress Notes (Addendum)
[address]       [place] [address]             [phone]      7 Days Post-Op  Procedure(s) (LRB): AORTIC VALVE REPLACEMENT (AVR) (N/A) INTRAOPERATIVE TRANSESOPHAGEAL ECHOCARDIOGRAM (N/A) CORONARY ARTERY BYPASS GRAFTING (CABG) (N/A) WEDGE RESECTION Right Lower Lobe (Right) Subjective: Feeling stronger  Objective  Telemetry appears to be poss first degree block  Temp:  [98 F (36.7 C)-98.4 F (36.9 C)] 98.4 F (36.9 C) (08/15 0428) Pulse Rate:  [73-85] 77 (08/15 0428) Resp:  [17-20] 20 (08/15 0428) BP: (118-137)/(67-72) 118/72 mmHg (08/15 0428) SpO2:  [93 %-94 %] 93 % (08/15 0428) Weight:  [169 lb 15.6 oz (77.1 kg)] 169 lb 15.6 oz (77.1 kg) (08/15 0428)   Intake/Output Summary (Last 24 hours) at 06/11/13 0815 Last data filed at 06/10/13 1354  Gross per 24 hour  Intake    360 ml  Output    500 ml  Net   -140 ml       General appearance: alert, cooperative and no distress Heart: regular rate and rhythm Lungs: clear to auscultation bilaterally Abdomen: benign exam Extremities: + L LE edema Wound: incis healing well  Lab Results:  Recent Labs  06/09/13 0530 06/10/13 0515  NA 128* 126*  K 3.3* 3.6  CL 94* 93*  CO2 27 24  GLUCOSE 102* 104*  BUN 22 18  CREATININE 1.06 0.98  CALCIUM 7.8* 7.8*   No results found for this basename: AST, ALT, ALKPHOS, BILITOT, PROT, ALBUMIN,  in the last 72 hours No results found for this basename: LIPASE, AMYLASE,  in the last 72 hours  Recent Labs  06/09/13 0530 06/10/13 0515  WBC 8.7 9.0  HGB 7.6* 8.7*  HCT 20.5* 23.8*  MCV 90.7 89.5  PLT 101* 117*   No results found for this basename: CKTOTAL, CKMB, TROPONINI,  in the last 72 hours No components found with this basename: POCBNP,  No results found for this basename: DDIMER,  in the last 72 hours No results found for this basename: HGBA1C,  in the last 72 hours No results found for this basename: CHOL, HDL, LDLCALC, TRIG, CHOLHDL,  in  the last 72 hours No results found for this basename: TSH, T4TOTAL, FREET3, T3FREE, THYROIDAB,  in the last 72 hours No results found for this basename: VITAMINB12, FOLATE, FERRITIN, TIBC, IRON, RETICCTPCT,  in the last 72 hours  Medications: Scheduled . amiodarone  400 mg Oral BID  . aspirin EC  325 mg Oral Daily   Or  . aspirin  324 mg Per Tube Daily  . atorvastatin  20 mg Oral q1800  . darifenacin  7.5 mg Oral Daily  . docusate sodium  200 mg Oral Daily  . doxazosin  4 mg Oral QHS  . ferrous fumarate  1 tablet Oral BID  . folic acid  1 mg Oral Daily  . furosemide  20 mg Intravenous Once  . metoprolol tartrate  12.5 mg Oral BID  . multivitamin with minerals  1 tablet Oral Daily  . pantoprazole  40 mg Oral QAC breakfast  . sodium chloride  3 mL Intravenous Q12H     Radiology/Studies:  Dg Chest 2 View  06/10/2013   *RADIOLOGY REPORT*  Clinical Data: Follow-up cardiac surgery  CHEST - 2 VIEW  Comparison: 06/06/2013  Findings: The right internal jugular Cordis has been removed since the prior study.  Left lung base opacity is stable, which  obscures the left hemidiaphragm.  This is likely a combination of pleural fluid and atelectasis.  Mild hazy opacity is noted at the right lung base, new from prior study, likely a small effusion with atelectasis.  There is no pulmonary edema.  There is no pneumothorax.  There is no mediastinal widening.  IMPRESSION: Opacity at the right lung base has increased from prior study most likely a small effusion with atelectasis.  Left lung base opacity, also most likely atelectasis and pleural fluid, is stable.  No pneumothorax or pulmonary edema.   Original Report Authenticated By: Amie Portland, M.D.    INR: Will add last result for INR, ABG once components are confirmed Will add last 4 CBG results once components are confirmed  Assessment/Plan: S/P Procedure(s) (LRB): AORTIC VALVE REPLACEMENT (AVR) (N/A) INTRAOPERATIVE TRANSESOPHAGEAL ECHOCARDIOGRAM  (N/A) CORONARY ARTERY BYPASS GRAFTING (CABG) (N/A) WEDGE RESECTION Right Lower Lobe (Right)  1 appears hemodyn stable with first degree block, occ missed beat. Will check ekg/ QTc 2 no wew labs- will recheck H/H , bmet in am, also Mg++ 3 push rehab/pulm toilet as able     LOS: 11 days    GOLD,WAYNE E 8/15/20148:15 AM  To [facility] Monday I have seen and examined [name] Life and agree with the above assessment  and plan.  Delight Ovens MD Beeper [phone] Office [phone] 06/11/2013 11:01 AM

## 2013-06-11 NOTE — Progress Notes (Signed)
CARDIAC REHAB PHASE I   PRE:  Rate/Rhythm: 86 SR    BP: lying 115/53    SaO2: 89-91 RA  MODE:  Ambulation: 300 ft   POST:  Rate/Rhythm: 93 SR    BP: sitting 113/53     SaO2: 95 RA  Tolerated well with RW. No c/o. Rest x2 with increased distance. To recliner after walk. [id number]   Elissa Lovett [place] CES, ACSM 06/11/2013 10:03 AM

## 2013-06-11 NOTE — Progress Notes (Signed)
CSW continuing to follow patient. CSW updated [facility] to let them know about possible dc Monday. [facility] stated "as far as I know, we have availability, please confirm on Monday". CSW will call Monday regarding dc plans and will update when new information arises.  Maree Krabbe, MSW, Theresia Majors [phone]

## 2013-06-12 DIAGNOSIS — I5031 Acute diastolic (congestive) heart failure: Secondary | ICD-10-CM

## 2013-06-12 DIAGNOSIS — I509 Heart failure, unspecified: Secondary | ICD-10-CM

## 2013-06-12 LAB — BASIC METABOLIC PANEL
Calcium: 7.4 mg/dL — ABNORMAL LOW (ref 8.4–10.5)
Creatinine, Ser: 0.94 mg/dL (ref 0.50–1.35)
GFR calc Af Amer: 85 mL/min — ABNORMAL LOW (ref >90)
GFR calc non Af Amer: 73 mL/min — ABNORMAL LOW (ref >90)

## 2013-06-12 LAB — MAGNESIUM: Magnesium: 2 mg/dL (ref 1.5–2.5)

## 2013-06-12 LAB — CBC
Platelets: 150 10*3/uL (ref 150–400)
RDW: 15.4 % (ref 11.5–15.5)
WBC: 11.6 10*3/uL — ABNORMAL HIGH (ref 4.0–10.5)

## 2013-06-12 MED ORDER — FUROSEMIDE 40 MG PO TABS
40.0000 mg | ORAL_TABLET | Freq: Two times a day (BID) | ORAL | Status: DC
  Administered 2013-06-13: 40 mg via ORAL
  Filled 2013-06-12 (×3): qty 1

## 2013-06-12 MED ORDER — AMIODARONE HCL 200 MG PO TABS
200.0000 mg | ORAL_TABLET | Freq: Two times a day (BID) | ORAL | Status: DC
  Administered 2013-06-12 – 2013-06-15 (×7): 200 mg via ORAL
  Filled 2013-06-12 (×8): qty 1

## 2013-06-12 MED ORDER — FUROSEMIDE 10 MG/ML IJ SOLN
40.0000 mg | Freq: Two times a day (BID) | INTRAMUSCULAR | Status: AC
  Administered 2013-06-12 (×2): 40 mg via INTRAVENOUS
  Filled 2013-06-12 (×2): qty 4

## 2013-06-12 NOTE — Progress Notes (Addendum)
      [address]       [place] [address]             [phone]      8 Days Post-Op Procedure(s) (LRB): AORTIC VALVE REPLACEMENT (AVR) (N/A) INTRAOPERATIVE TRANSESOPHAGEAL ECHOCARDIOGRAM (N/A) CORONARY ARTERY BYPASS GRAFTING (CABG) (N/A) WEDGE RESECTION Right Lower Lobe (Right)  Subjective:  Mr. Schetter is doing well this morning.  He states his heart rate got very low last night.  He states it went into the 40s and they hooked his pacer back up.  He is ambulating with assistance. +BM  Objective: Vital signs in last 24 hours: Temp:  [97.7 F (36.5 C)-97.9 F (36.6 C)] 97.7 F (36.5 C) (08/16 0439) Pulse Rate:  [73-95] 77 (08/16 0439) Cardiac Rhythm:  [-] Heart block (08/15 1940) Resp:  [17-19] 18 (08/16 0439) BP: (105-134)/(48-64) 119/56 mmHg (08/16 0439) SpO2:  [92 %-95 %] 92 % (08/16 0439) Weight:  [173 lb 1 oz (78.5 kg)] 173 lb 1 oz (78.5 kg) (08/16 0439) Intake/Output from previous day: 08/15 0701 - 08/16 0700 In: 590 [P.O.:590] Out: 750 [Urine:750]  General appearance: alert, cooperative and no distress Heart: regular rate and rhythm Lungs: clear to auscultation bilaterally Abdomen: soft, non-tender; bowel sounds normal; no masses,  no organomegaly Extremities: edema +1 Wound: clean and dry  Lab Results:  Recent Labs  06/10/13 0515 06/12/13 0500  WBC 9.0 11.6*  HGB 8.7* 8.3*  HCT 23.8* 22.5*  PLT 117* 150   BMET:  Recent Labs  06/10/13 0515 06/12/13 0500  NA 126* 123*  K 3.6 3.8  CL 93* 92*  CO2 24 23  GLUCOSE 104* 105*  BUN 18 15  CREATININE 0.98 0.94  CALCIUM 7.8* 7.4*    PT/INR: No results found for this basename: LABPROT, INR,  in the last 72 hours ABG    Component Value Date/Time   PHART 7.369 06/05/2013 0212   HCO3 20.4 06/05/2013 0212   TCO2 18 06/05/2013 1649   ACIDBASEDEF 4.0* 06/05/2013 0212   O2SAT 99.0 06/05/2013 0212   CBG (last 3)  No results found for this basename: GLUCAP,  in the last 72  hours  Assessment/Plan: S/P Procedure(s) (LRB): AORTIC VALVE REPLACEMENT (AVR) (N/A) INTRAOPERATIVE TRANSESOPHAGEAL ECHOCARDIOGRAM (N/A) CORONARY ARTERY BYPASS GRAFTING (CABG) (N/A) WEDGE RESECTION Right Lower Lobe (Right)  1. CV- previous A. Flutter/SVT- NSR this morning- on Lopressor 12.5 mg daily, Amiodarone 400 mg BID 2. Pulm- no acute issues, encouraged use of IS 3. Renal- Creatinine WNL, volume overloaded, weight is up 20 lbs since admission, will give IV Lasix today, start oral regimen in AM 4. Expected blood loss anemia- moderate Hgb 8.3 5. Deconditioning- continue PT/OT 6. Dispo- patient stable, some Bradycardia overnight may benefit from decrease in Amiodarone, diuresis, tentative for SNF Monday    LOS: 12 days    BARRETT, ERIN 06/12/2013  I have seen and examined the patient and agree with the assessment and plan as outlined.  Decrease amiodarone.  OWEN,CLARENCE H 06/12/2013 11:05 AM

## 2013-06-12 NOTE — Progress Notes (Signed)
Pt ambulated 450 ft on room air with a rolling walker, gait steady, no c/o distress noted, Pt back in room, call bell placed within reach, will continue plan of care

## 2013-06-12 NOTE — Progress Notes (Signed)
CARDIAC REHAB PHASE I   PRE:  Rate/Rhythm: 68SR  BP:  Supine:   Sitting: 116/64  Standing:    SaO2: 91%RA  MODE:  Ambulation: 350 ft   POST:  Rate/Rhythm: 88 SR  BP:  Supine:   Sitting: 129/55  Standing:    SaO2: 93-94%RA 0825-0845 Pt walked 350 ft on RA with rolling walker and asst x 1 with slow steady gait. Stopped once to rest. To recliner after walk. Sats good on RA. No c/o dizziness.   Luetta Nutting, RN BSN  06/12/2013 8:43 AM

## 2013-06-12 NOTE — Progress Notes (Signed)
Pt. Walked 150 feet using walker without O2. He tolerated the walk well.

## 2013-06-13 DIAGNOSIS — Z951 Presence of aortocoronary bypass graft: Secondary | ICD-10-CM

## 2013-06-13 DIAGNOSIS — Z952 Presence of prosthetic heart valve: Secondary | ICD-10-CM

## 2013-06-13 MED ORDER — FUROSEMIDE 10 MG/ML IJ SOLN
40.0000 mg | Freq: Two times a day (BID) | INTRAMUSCULAR | Status: AC
  Administered 2013-06-13: 40 mg via INTRAVENOUS
  Filled 2013-06-13: qty 4

## 2013-06-13 MED ORDER — POTASSIUM CHLORIDE CRYS ER 20 MEQ PO TBCR
40.0000 meq | EXTENDED_RELEASE_TABLET | Freq: Every day | ORAL | Status: DC
  Administered 2013-06-13 – 2013-06-15 (×3): 40 meq via ORAL
  Filled 2013-06-13 (×3): qty 2

## 2013-06-13 NOTE — Progress Notes (Signed)
Removed epicardial wires per order. 3 intact.  Pt tolerated procedure well.  Pt instructed to remain on bedrest for one hour.  Frequent vitals will be taken and documented. Pt resting with call bell within reach. Joseph Garner   

## 2013-06-13 NOTE — Progress Notes (Addendum)
      [address]       [place] [address]             [phone]      9 Days Post-Op Procedure(s) (LRB): AORTIC VALVE REPLACEMENT (AVR) (N/A) INTRAOPERATIVE TRANSESOPHAGEAL ECHOCARDIOGRAM (N/A) CORONARY ARTERY BYPASS GRAFTING (CABG) (N/A) WEDGE RESECTION Right Lower Lobe (Right)  Subjective:  Patient states he still feels weak this morning.  He otherwise has no complaints.  He looks great. + BM  Objective: Vital signs in last 24 hours: Temp:  [97.5 F (36.4 C)-97.9 F (36.6 C)] 97.9 F (36.6 C) (08/17 0442) Pulse Rate:  [58-85] 58 (08/17 0442) Cardiac Rhythm:  [-] Heart block (08/17 0832) Resp:  [16-18] 18 (08/17 0442) BP: (110-126)/(58-88) 121/59 mmHg (08/17 0442) SpO2:  [92 %-95 %] 92 % (08/17 0442) Weight:  [168 lb (76.204 kg)] 168 lb (76.204 kg) (08/17 0442)  Intake/Output from previous day: 08/16 0701 - 08/17 0700 In: -  Out: 2300 [Urine:2300] Intake/Output this shift: Total I/O In: -  Out: 200 [Urine:200]  General appearance: alert, cooperative and no distress Heart: regular rate and rhythm Lungs: clear to auscultation bilaterally Abdomen: soft, non-tender; bowel sounds normal; no masses,  no organomegaly Extremities: edema 1-2+ pitting Wound: clean and dry  Lab Results:  Recent Labs  06/12/13 0500  WBC 11.6*  HGB 8.3*  HCT 22.5*  PLT 150   BMET:  Recent Labs  06/12/13 0500  NA 123*  K 3.8  CL 92*  CO2 23  GLUCOSE 105*  BUN 15  CREATININE 0.94  CALCIUM 7.4*    PT/INR: No results found for this basename: LABPROT, INR,  in the last 72 hours ABG    Component Value Date/Time   PHART 7.369 06/05/2013 0212   HCO3 20.4 06/05/2013 0212   TCO2 18 06/05/2013 1649   ACIDBASEDEF 4.0* 06/05/2013 0212   O2SAT 99.0 06/05/2013 0212   CBG (last 3)  No results found for this basename: GLUCAP,  in the last 72 hours  Assessment/Plan: S/P Procedure(s) (LRB): AORTIC VALVE REPLACEMENT (AVR) (N/A) INTRAOPERATIVE TRANSESOPHAGEAL  ECHOCARDIOGRAM (N/A) CORONARY ARTERY BYPASS GRAFTING (CABG) (N/A) WEDGE RESECTION Right Lower Lobe (Right)  1. CV- NSR some bradycardia- will continue Lopressor and Amiodarone 2. Pulm- no acute issues, off oxygen, continue IS 3. Renal- remains volume overloaded, good response with IV Lasix yesterday, weight is now 14lbs over admission, will repeat IV regimen today 4. Deconditioning- PT/OT 5. Dispo- patient stable, diurese today, likely to SNF in AM   LOS: 13 days    BARRETT, ERIN 06/13/2013  I have seen and examined the patient and agree with the assessment and plan as outlined.  OWEN,CLARENCE H 06/13/2013 11:42 AM

## 2013-06-14 LAB — BASIC METABOLIC PANEL
Chloride: 88 mEq/L — ABNORMAL LOW (ref 96–112)
GFR calc Af Amer: 65 mL/min — ABNORMAL LOW (ref >90)
Potassium: 3.3 mEq/L — ABNORMAL LOW (ref 3.5–5.1)

## 2013-06-14 LAB — CBC
HCT: 24.4 % — ABNORMAL LOW (ref 39.0–52.0)
Hemoglobin: 9 g/dL — ABNORMAL LOW (ref 13.0–17.0)
RDW: 14.8 % (ref 11.5–15.5)
WBC: 12.8 10*3/uL — ABNORMAL HIGH (ref 4.0–10.5)

## 2013-06-14 NOTE — Progress Notes (Addendum)
      [address]       [place] [address]             [phone]      10 Days Post-Op Procedure(s) (LRB): AORTIC VALVE REPLACEMENT (AVR) (N/A) INTRAOPERATIVE TRANSESOPHAGEAL ECHOCARDIOGRAM (N/A) CORONARY ARTERY BYPASS GRAFTING (CABG) (N/A) WEDGE RESECTION Right Lower Lobe (Right)  Subjective:  Joseph Garner states he is dizzy this morning.  He also states that he feels short of breath.  However he standing at the sink washing his face and is stable on his feet. He is maintaining good oxygen saturations   Objective: Vital signs in last 24 hours: Temp:  [97.9 F (36.6 C)-98.2 F (36.8 C)] 97.9 F (36.6 C) (08/18 0413) Pulse Rate:  [60-80] 75 (08/18 0413) Cardiac Rhythm:  [-] Heart block (08/17 1935) Resp:  [18-19] 19 (08/18 0413) BP: (101-147)/(46-73) 147/71 mmHg (08/18 0413) SpO2:  [91 %-95 %] 91 % (08/18 0413) Weight:  [166 lb 6.4 oz (75.479 kg)] 166 lb 6.4 oz (75.479 kg) (08/18 0413)  Intake/Output from previous day: 08/17 0701 - 08/18 0700 In: 400 [P.O.:400] Out: 2175 [Urine:2175]  General appearance: alert, cooperative and no distress Heart: regular rate and rhythm Lungs: clear to auscultation bilaterally Extremities: edema +1 pitting, improved Wound: clean and dry  Lab Results:  Recent Labs  06/12/13 0500  WBC 11.6*  HGB 8.3*  HCT 22.5*  PLT 150   BMET:  Recent Labs  06/12/13 0500 06/14/13 0503  NA 123* 123*  K 3.8 3.3*  CL 92* 88*  CO2 23 25  GLUCOSE 105* 108*  BUN 15 15  CREATININE 0.94 1.13  CALCIUM 7.4* 7.9*    PT/INR: No results found for this basename: LABPROT, INR,  in the last 72 hours ABG    Component Value Date/Time   PHART 7.369 06/05/2013 0212   HCO3 20.4 06/05/2013 0212   TCO2 18 06/05/2013 1649   ACIDBASEDEF 4.0* 06/05/2013 0212   O2SAT 99.0 06/05/2013 0212   CBG (last 3)  No results found for this basename: GLUCAP,  in the last 72 hours  Assessment/Plan: S/P Procedure(s) (LRB): AORTIC VALVE REPLACEMENT (AVR)  (N/A) INTRAOPERATIVE TRANSESOPHAGEAL ECHOCARDIOGRAM (N/A) CORONARY ARTERY BYPASS GRAFTING (CABG) (N/A) WEDGE RESECTION Right Lower Lobe (Right)  1. CV-NSR rate controlled, pressure elevated this morning- will continue Lopressor and Amiodarone 2. Pulm- no acute issues, encouraged IS, O2 sats are 91% 3. Renal- mild elevation in creatinine at 1.13, remains volume overloaded, but improving continue diuresis 4. Deconditioning- PT/OT, Needs SNF 5. Dispo- patient with complaints of dizziness, he is medically stable at this time, would benefit from SNF placement today will discuss with staff   LOS: 14 days    BARRETT, ERIN 06/14/2013  I have seen and examined Joseph Garner and agree with the above assessment  and plan.  Delight Ovens MD Beeper [phone] Office [phone] 06/14/2013 6:21 PM

## 2013-06-14 NOTE — Progress Notes (Signed)
Physical Therapy Treatment Patient Details Name: Joseph Garner MRN: [id number] DOB: 20-Aug-1924 Today's Date: 06/14/2013 Time: 1345-1401 PT Time Calculation (min): 16 min  PT Assessment / Plan / Recommendation  History of Present Illness 79 y.o. male admitted to [facility] 05/31/13 with worsening SOB and DOE.  He was scheduled to have an aortic valave replacement next week.  Cardiac surgery is looking to move the surgery forward as the pt's symptoms have become more emergent and severe.     PT Comments   Patient continues to demonstrate improvements in activity tolerance and mobility. Patient still appears very guarded with ambulation despite VCs for increased pacing. Will continue to progress activity as tolerated.  Follow Up Recommendations  SNF           Equipment Recommendations  None recommended by PT       Frequency Min 3X/week   Progress towards PT Goals Progress towards PT goals: Progressing toward goals  Plan Current plan remains appropriate    Precautions / Restrictions Precautions Precautions: Fall Precaution Comments: Monitor HR, DOE, and O2 sats with mobility Restrictions Weight Bearing Restrictions: No   Pertinent Vitals/Pain No pain at this time, spO2 94% on room air    Mobility  Bed Mobility Bed Mobility: Sit to Supine Sit to Supine: 4: Min assist Details for Bed Mobility Assistance: assist for LE elevation and positioning in bed Transfers Transfers: Sit to Stand;Stand to Sit Sit to Stand: 4: Min assist Stand to Sit: 4: Min assist Details for Transfer Assistance: VCs for sternal precautions and rocker tecnique Ambulation/Gait Ambulation/Gait Assistance: 4: Min guard Ambulation Distance (Feet): 440 Feet Assistive device: Rolling walker Ambulation/Gait Assistance Details: Some instability noted with slow gait, VCs and multi modal cues for pacing.  Gait Pattern: Step-through pattern;Decreased stride length Gait velocity: decreased General Gait Details: very slow  gait      PT Goals (current goals can now be found in the care plan section) Acute Rehab PT Goals Patient Stated Goal: to get back to independent lifestyle PT Goal Formulation: With patient Time For Goal Achievement: 06/21/13 Potential to Achieve Goals: Fair  Visit Information  Last PT Received On: 06/14/13 Assistance Needed: +1 History of Present Illness: 79 y.o. male admitted to [facility] 05/31/13 with worsening SOB and DOE.  He was scheduled to have an aortic valave replacement next week.  Cardiac surgery is looking to move the surgery forward as the pt's symptoms have become more emergent and severe.      Subjective Data  Subjective: I am trying to do better Patient Stated Goal: to get back to independent lifestyle   Cognition  Cognition Arousal/Alertness: Awake/alert Behavior During Therapy: WFL for tasks assessed/performed Overall Cognitive Status: Within Functional Limits for tasks assessed    Balance  Balance Balance Assessed: Yes High Level Balance High Level Balance Activites: Side stepping;Backward walking;Direction changes;Turns;Head turns High Level Balance Comments: Min guard for instability; VCs to increase speed and momentum for improved balance  End of Session PT - End of Session Equipment Utilized During Treatment: Gait belt Activity Tolerance: Patient limited by fatigue Patient left: in bed;with call bell/phone within reach Nurse Communication: Mobility status;Precautions   GP     Fabio Asa 06/14/2013, 2:04 PM Charlotte Crumb, PT DPT  [phone]

## 2013-06-14 NOTE — Progress Notes (Signed)
CARDIAC REHAB PHASE I   PRE:  Rate/Rhythm: 81SR  BP:  Supine:   Sitting: 120/60  Standing: 100/60   SaO2: 93%RA  MODE:  Ambulation: 450 ft   POST:  Rate/Rhythm: 85SR  BP:  Supine:   Sitting: 110/70  Standing:    SaO2: 95%RA 1105-1148 Education completed with patient and family. Discussed CRP 2 and permission given to refer to [place]. Pt stated he was dizzy this a.m. when he walked, so I walked with him again. Pt has stated other walks with me that he is light headed or dizzy. BP did drop with standing as documented. Pt walked 450 ft with rolling walker and asst x 1 with slow steady gait. When asked if he felt light headed he said yes.  He also felt that the dizziness got a little worse walking. To sitting on side of bed after walk. BP 110/70. Family in room.   Luetta Nutting, RN BSN  06/14/2013 11:41 AM

## 2013-06-14 NOTE — Progress Notes (Addendum)
Clinical Social Work Department CLINICAL SOCIAL WORK PLACEMENT NOTE 06/14/2013  Patient:  JANTHONY, HOLLEMAN  Account Number:  [id number] Admit date:  05/31/2013  Clinical Social Worker:  Carren Rang  Date/time:  06/14/2013 09:19 AM  Clinical Social Work is seeking post-discharge placement for this patient at the following level of care:   SKILLED NURSING   (*CSW will update this form in Epic as items are completed)   06/08/2013  Patient/family provided with [facility] Department of Clinical Social Work's list of facilities offering this level of care within the geographic area requested by the patient (or if unable, by the patient's family).  06/08/2013  Patient/family informed of their freedom to choose among providers that offer the needed level of care, that participate in Medicare, Medicaid or managed care program needed by the patient, have an available bed and are willing to accept the patient.  06/08/2013  Patient/family informed of [facility]' ownership interest in [facility], as well as of the fact that they are under no obligation to receive care at this facility.  PASARR submitted to EDS on 06/04/2013 PASARR number received from EDS on 06/04/2013  FL2 transmitted to all facilities in geographic area requested by pt/family on  06/04/2013 FL2 transmitted to all facilities within larger geographic area on   Patient informed that his/her managed care company has contracts with or will negotiate with  certain facilities, including the following:     Patient/family informed of bed offers received:  06/08/2013 Patient chooses bed at [facility] Physician recommends and patient chooses bed at    Patient to be transferred to [facility] on  06/15/2013 Patient to be transferred to facility by Family  The following physician request were entered in Epic:   Additional Comments:  Maree Krabbe, MSW, [employer] [phone]

## 2013-06-15 ENCOUNTER — Inpatient Hospital Stay
Admission: RE | Admit: 2013-06-15 | Discharge: 2013-06-20 | Disposition: A | Discharge status: Nursing Home (Includes ICF) | Payer: PRIVATE HEALTH INSURANCE | Source: Ambulatory Visit | Attending: Internal Medicine | Admitting: Internal Medicine

## 2013-06-15 DIAGNOSIS — I5033 Acute on chronic diastolic (congestive) heart failure: Secondary | ICD-10-CM | POA: Diagnosis present

## 2013-06-15 DIAGNOSIS — I25810 Atherosclerosis of coronary artery bypass graft(s) without angina pectoris: Secondary | ICD-10-CM | POA: Diagnosis present

## 2013-06-15 DIAGNOSIS — I5022 Chronic systolic (congestive) heart failure: Secondary | ICD-10-CM | POA: Diagnosis not present

## 2013-06-15 DIAGNOSIS — I4891 Unspecified atrial fibrillation: Secondary | ICD-10-CM | POA: Diagnosis present

## 2013-06-15 DIAGNOSIS — T819XXA Unspecified complication of procedure, initial encounter: Secondary | ICD-10-CM | POA: Diagnosis not present

## 2013-06-15 DIAGNOSIS — I5031 Acute diastolic (congestive) heart failure: Secondary | ICD-10-CM | POA: Diagnosis not present

## 2013-06-15 DIAGNOSIS — Q230 Congenital stenosis of aortic valve: Secondary | ICD-10-CM | POA: Diagnosis not present

## 2013-06-15 DIAGNOSIS — IMO0002 Reserved for concepts with insufficient information to code with codable children: Secondary | ICD-10-CM | POA: Diagnosis not present

## 2013-06-15 DIAGNOSIS — R41841 Cognitive communication deficit: Secondary | ICD-10-CM | POA: Diagnosis not present

## 2013-06-15 DIAGNOSIS — R0602 Shortness of breath: Secondary | ICD-10-CM | POA: Diagnosis not present

## 2013-06-15 DIAGNOSIS — R279 Unspecified lack of coordination: Secondary | ICD-10-CM | POA: Diagnosis not present

## 2013-06-15 DIAGNOSIS — H3530 Unspecified macular degeneration: Secondary | ICD-10-CM | POA: Diagnosis present

## 2013-06-15 DIAGNOSIS — F329 Major depressive disorder, single episode, unspecified: Secondary | ICD-10-CM | POA: Diagnosis present

## 2013-06-15 DIAGNOSIS — M129 Arthropathy, unspecified: Secondary | ICD-10-CM | POA: Diagnosis present

## 2013-06-15 DIAGNOSIS — Z952 Presence of prosthetic heart valve: Secondary | ICD-10-CM | POA: Diagnosis not present

## 2013-06-15 DIAGNOSIS — I10 Essential (primary) hypertension: Secondary | ICD-10-CM | POA: Diagnosis present

## 2013-06-15 DIAGNOSIS — R5381 Other malaise: Secondary | ICD-10-CM | POA: Diagnosis present

## 2013-06-15 DIAGNOSIS — D649 Anemia, unspecified: Secondary | ICD-10-CM | POA: Diagnosis not present

## 2013-06-15 DIAGNOSIS — I509 Heart failure, unspecified: Secondary | ICD-10-CM | POA: Diagnosis not present

## 2013-06-15 DIAGNOSIS — J189 Pneumonia, unspecified organism: Secondary | ICD-10-CM | POA: Diagnosis not present

## 2013-06-15 DIAGNOSIS — E236 Other disorders of pituitary gland: Secondary | ICD-10-CM | POA: Diagnosis present

## 2013-06-15 DIAGNOSIS — R262 Difficulty in walking, not elsewhere classified: Secondary | ICD-10-CM | POA: Diagnosis not present

## 2013-06-15 DIAGNOSIS — M4800 Spinal stenosis, site unspecified: Secondary | ICD-10-CM | POA: Diagnosis present

## 2013-06-15 DIAGNOSIS — N400 Enlarged prostate without lower urinary tract symptoms: Secondary | ICD-10-CM | POA: Diagnosis present

## 2013-06-15 DIAGNOSIS — T8131XA Disruption of external operation (surgical) wound, not elsewhere classified, initial encounter: Secondary | ICD-10-CM | POA: Diagnosis not present

## 2013-06-15 DIAGNOSIS — R404 Transient alteration of awareness: Secondary | ICD-10-CM | POA: Diagnosis not present

## 2013-06-15 DIAGNOSIS — K219 Gastro-esophageal reflux disease without esophagitis: Secondary | ICD-10-CM | POA: Diagnosis present

## 2013-06-15 DIAGNOSIS — E43 Unspecified severe protein-calorie malnutrition: Secondary | ICD-10-CM | POA: Diagnosis present

## 2013-06-15 DIAGNOSIS — I2510 Atherosclerotic heart disease of native coronary artery without angina pectoris: Secondary | ICD-10-CM | POA: Diagnosis not present

## 2013-06-15 DIAGNOSIS — I951 Orthostatic hypotension: Secondary | ICD-10-CM | POA: Diagnosis not present

## 2013-06-15 DIAGNOSIS — I359 Nonrheumatic aortic valve disorder, unspecified: Secondary | ICD-10-CM | POA: Diagnosis present

## 2013-06-15 DIAGNOSIS — K4090 Unilateral inguinal hernia, without obstruction or gangrene, not specified as recurrent: Secondary | ICD-10-CM | POA: Diagnosis present

## 2013-06-15 DIAGNOSIS — J90 Pleural effusion, not elsewhere classified: Secondary | ICD-10-CM | POA: Diagnosis present

## 2013-06-15 DIAGNOSIS — J9620 Acute and chronic respiratory failure, unspecified whether with hypoxia or hypercapnia: Secondary | ICD-10-CM | POA: Diagnosis not present

## 2013-06-15 DIAGNOSIS — E871 Hypo-osmolality and hyponatremia: Secondary | ICD-10-CM | POA: Diagnosis not present

## 2013-06-15 DIAGNOSIS — Z954 Presence of other heart-valve replacement: Secondary | ICD-10-CM | POA: Diagnosis not present

## 2013-06-15 DIAGNOSIS — E876 Hypokalemia: Secondary | ICD-10-CM | POA: Diagnosis present

## 2013-06-15 DIAGNOSIS — M6281 Muscle weakness (generalized): Secondary | ICD-10-CM | POA: Diagnosis not present

## 2013-06-15 DIAGNOSIS — H9190 Unspecified hearing loss, unspecified ear: Secondary | ICD-10-CM | POA: Diagnosis present

## 2013-06-15 LAB — BASIC METABOLIC PANEL
BUN: 13 mg/dL (ref 6–23)
CO2: 23 mEq/L (ref 19–32)
Calcium: 7.8 mg/dL — ABNORMAL LOW (ref 8.4–10.5)
Chloride: 93 mEq/L — ABNORMAL LOW (ref 96–112)
Creatinine, Ser: 0.9 mg/dL (ref 0.50–1.35)
GFR calc Af Amer: 86 mL/min — ABNORMAL LOW (ref >90)
GFR calc non Af Amer: 74 mL/min — ABNORMAL LOW (ref >90)
Glucose, Bld: 95 mg/dL (ref 70–99)
Potassium: 3.9 mEq/L (ref 3.5–5.1)
Sodium: 126 mEq/L — ABNORMAL LOW (ref 135–145)

## 2013-06-15 LAB — CBC
HCT: 23.9 % — ABNORMAL LOW (ref 39.0–52.0)
Hemoglobin: 8.7 g/dL — ABNORMAL LOW (ref 13.0–17.0)
MCH: 32.8 pg (ref 26.0–34.0)
MCHC: 36.4 g/dL — ABNORMAL HIGH (ref 30.0–36.0)
MCV: 90.2 fL (ref 78.0–100.0)
Platelets: 212 10*3/uL (ref 150–400)
RBC: 2.65 MIL/uL — ABNORMAL LOW (ref 4.22–5.81)
RDW: 15.1 % (ref 11.5–15.5)
WBC: 12.9 10*3/uL — ABNORMAL HIGH (ref 4.0–10.5)

## 2013-06-15 MED ORDER — ATORVASTATIN CALCIUM 20 MG PO TABS: 20.0000 mg | ORAL_TABLET | Freq: Every day | ORAL | Status: DC

## 2013-06-15 MED ORDER — FUROSEMIDE 40 MG PO TABS: 40.0000 mg | ORAL_TABLET | Freq: Every day | ORAL | Status: DC

## 2013-06-15 MED ORDER — ASPIRIN 325 MG PO TBEC: 325.0000 mg | DELAYED_RELEASE_TABLET | Freq: Every day | ORAL | Status: DC

## 2013-06-15 MED ORDER — TRAMADOL HCL 50 MG PO TABS: 50.0000 mg | ORAL_TABLET | Freq: Four times a day (QID) | ORAL | Status: DC | PRN

## 2013-06-15 MED ORDER — AMIODARONE HCL 200 MG PO TABS: 200.0000 mg | ORAL_TABLET | Freq: Two times a day (BID) | ORAL | Status: DC

## 2013-06-15 MED ORDER — FOLIC ACID 1 MG PO TABS: 1.0000 mg | ORAL_TABLET | Freq: Every day | ORAL | Status: DC

## 2013-06-15 MED ORDER — POTASSIUM CHLORIDE CRYS ER 20 MEQ PO TBCR: 20.0000 meq | EXTENDED_RELEASE_TABLET | Freq: Every day | ORAL | Status: DC

## 2013-06-15 MED ORDER — FERROUS FUMARATE 325 (106 FE) MG PO TABS: 1.0000 | ORAL_TABLET | Freq: Two times a day (BID) | ORAL | Status: DC

## 2013-06-15 MED ORDER — METOPROLOL TARTRATE 12.5 MG HALF TABLET: 12.5000 mg | ORAL_TABLET | Freq: Two times a day (BID) | ORAL | Status: DC

## 2013-06-15 NOTE — Progress Notes (Signed)
CARDIAC REHAB PHASE I   PRE:  Rate/Rhythm: 74SR  BP:  Supine: 104/70  Sitting:   Standing:    SaO2: 93%RA  MODE:  Ambulation: 400 ft   POST:  Rate/Rhythm: 76  BP:  Supine:   Sitting: 102/70  Standing:    SaO2: 95%RA 1025-1055 Pt walked 400 ft on RA with rolling walker and asst x 1. Tolerated well. To recliner with call bell after walk. Still a little lightheaded with walk but has been this way on previous walks with me.   Luetta Nutting, RN BSN  06/15/2013 10:52 AM

## 2013-06-15 NOTE — Progress Notes (Signed)
Removed pt's chest tube sutures per MD order. Placed steri strips and benzoin over site. Pt tolerated well.

## 2013-06-15 NOTE — Progress Notes (Signed)
Discharge instructions given to pt and pt's family. Also gave family transport packet to give to facility upon arrival. Pt is getting dressed and will be transported by family to [facility].

## 2013-06-15 NOTE — Progress Notes (Signed)
Called report to [facility]. Pt is stable and ready for discharge with family. Will provide packet for family to give to [facility] upon arrival.

## 2013-06-15 NOTE — Progress Notes (Signed)
Clinical Social Worker facilitated patient discharge by contacting the patient, family and facility, [facility]. Patient agreeable to this plan and arranging transport via daughter and wife. CSW will sign off, as social work intervention is no longer needed.  Maree Krabbe, MSW, Theresia Majors [phone]

## 2013-06-15 NOTE — Progress Notes (Signed)
      [address]       [place] [address]             [phone]      11 Days Post-Op Procedure(s) (LRB): AORTIC VALVE REPLACEMENT (AVR) (N/A) INTRAOPERATIVE TRANSESOPHAGEAL ECHOCARDIOGRAM (N/A) CORONARY ARTERY BYPASS GRAFTING (CABG) (N/A) WEDGE RESECTION Right Lower Lobe (Right)  Subjective:  Mr. Bachtel states he feels good this morning.  He remains a little apprehensive about being discharged.  I spoke to the patient at length about him being medically stable and ready for more aggressive PT to increase his mobility and strength. +BM  Objective: Vital signs in last 24 hours: Temp:  [97.8 F (36.6 C)-98 F (36.7 C)] 97.8 F (36.6 C) (08/19 0458) Pulse Rate:  [66-81] 72 (08/19 0458) Cardiac Rhythm:  [-] Heart block (08/18 2106) Resp:  [16-18] 18 (08/19 0458) BP: (105-157)/(55-77) 128/74 mmHg (08/19 0458) SpO2:  [93 %-96 %] 95 % (08/19 0458) Weight:  [166 lb 10.7 oz (75.6 kg)] 166 lb 10.7 oz (75.6 kg) (08/19 0458)  Intake/Output from previous day: 08/18 0701 - 08/19 0700 In: -  Out: 300 [Urine:300]  General appearance: alert, cooperative and no distress Neurologic: intact Heart: regular rate and rhythm Lungs: clear to auscultation bilaterally Extremities: edema trace Wound: clean and dry  Lab Results:  Recent Labs  06/14/13 1330 06/15/13 0500  WBC 12.8* 12.9*  HGB 9.0* 8.7*  HCT 24.4* 23.9*  PLT 210 212   BMET:  Recent Labs  06/14/13 0503 06/15/13 0500  NA 123* 126*  K 3.3* 3.9  CL 88* 93*  CO2 25 23  GLUCOSE 108* 95  BUN 15 13  CREATININE 1.13 0.90  CALCIUM 7.9* 7.8*    PT/INR: No results found for this basename: LABPROT, INR,  in the last 72 hours ABG    Component Value Date/Time   PHART 7.369 06/05/2013 0212   HCO3 20.4 06/05/2013 0212   TCO2 18 06/05/2013 1649   ACIDBASEDEF 4.0* 06/05/2013 0212   O2SAT 99.0 06/05/2013 0212   CBG (last 3)  No results found for this basename: GLUCAP,  in the last 72  hours  Assessment/Plan: S/P Procedure(s) (LRB): AORTIC VALVE REPLACEMENT (AVR) (N/A) INTRAOPERATIVE TRANSESOPHAGEAL ECHOCARDIOGRAM (N/A) CORONARY ARTERY BYPASS GRAFTING (CABG) (N/A) WEDGE RESECTION Right Lower Lobe (Right)  1. CV- NSR, good rate and pressure controlled, Orthostatic pressures WNL- on Amiodarone and Lopressor 2. Pulm- no acute issues, continue IS 3. Renal- remains volume overloaded, continue daily Lasix 4. Expected blood loss anemia- Hgb stable at 8.7 on Iron supplements 5. Deconditioning PT/OT 6. Dispo- patient is medically stable, will plan to d/c to SNF today   LOS: 15 days    Raford Pitcher, Denny Peon 06/15/2013

## 2013-06-16 ENCOUNTER — Other Ambulatory Visit: Payer: Self-pay | Admitting: Geriatric Medicine

## 2013-06-16 MED ORDER — TRAMADOL HCL 50 MG PO TABS: 50.0000 mg | ORAL_TABLET | Freq: Four times a day (QID) | ORAL | Status: DC | PRN

## 2013-06-20 ENCOUNTER — Encounter (HOSPITAL_COMMUNITY): Payer: Self-pay

## 2013-06-20 ENCOUNTER — Emergency Department (HOSPITAL_COMMUNITY): Payer: Medicare Other

## 2013-06-20 ENCOUNTER — Inpatient Hospital Stay (HOSPITAL_COMMUNITY)
Admission: EM | Admit: 2013-06-20 | Discharge: 2013-07-02 | DRG: 193 | Disposition: A | Discharge status: Skilled Nursing Facility | Payer: Medicare Other | Attending: Cardiology | Admitting: Cardiology

## 2013-06-20 DIAGNOSIS — I5033 Acute on chronic diastolic (congestive) heart failure: Secondary | ICD-10-CM | POA: Diagnosis not present

## 2013-06-20 DIAGNOSIS — I25810 Atherosclerosis of coronary artery bypass graft(s) without angina pectoris: Secondary | ICD-10-CM | POA: Diagnosis present

## 2013-06-20 DIAGNOSIS — M6281 Muscle weakness (generalized): Secondary | ICD-10-CM | POA: Diagnosis not present

## 2013-06-20 DIAGNOSIS — M129 Arthropathy, unspecified: Secondary | ICD-10-CM | POA: Diagnosis present

## 2013-06-20 DIAGNOSIS — R0602 Shortness of breath: Secondary | ICD-10-CM | POA: Diagnosis not present

## 2013-06-20 DIAGNOSIS — K4090 Unilateral inguinal hernia, without obstruction or gangrene, not specified as recurrent: Secondary | ICD-10-CM | POA: Diagnosis present

## 2013-06-20 DIAGNOSIS — F3289 Other specified depressive episodes: Secondary | ICD-10-CM | POA: Diagnosis present

## 2013-06-20 DIAGNOSIS — J9600 Acute respiratory failure, unspecified whether with hypoxia or hypercapnia: Secondary | ICD-10-CM | POA: Diagnosis not present

## 2013-06-20 DIAGNOSIS — I4891 Unspecified atrial fibrillation: Secondary | ICD-10-CM | POA: Diagnosis present

## 2013-06-20 DIAGNOSIS — N400 Enlarged prostate without lower urinary tract symptoms: Secondary | ICD-10-CM | POA: Diagnosis present

## 2013-06-20 DIAGNOSIS — Z953 Presence of xenogenic heart valve: Secondary | ICD-10-CM

## 2013-06-20 DIAGNOSIS — Z952 Presence of prosthetic heart valve: Secondary | ICD-10-CM | POA: Diagnosis not present

## 2013-06-20 DIAGNOSIS — J90 Pleural effusion, not elsewhere classified: Secondary | ICD-10-CM | POA: Diagnosis present

## 2013-06-20 DIAGNOSIS — K219 Gastro-esophageal reflux disease without esophagitis: Secondary | ICD-10-CM | POA: Diagnosis present

## 2013-06-20 DIAGNOSIS — H3530 Unspecified macular degeneration: Secondary | ICD-10-CM | POA: Diagnosis present

## 2013-06-20 DIAGNOSIS — J9819 Other pulmonary collapse: Secondary | ICD-10-CM | POA: Diagnosis not present

## 2013-06-20 DIAGNOSIS — T8131XA Disruption of external operation (surgical) wound, not elsewhere classified, initial encounter: Secondary | ICD-10-CM | POA: Diagnosis not present

## 2013-06-20 DIAGNOSIS — F329 Major depressive disorder, single episode, unspecified: Secondary | ICD-10-CM | POA: Diagnosis present

## 2013-06-20 DIAGNOSIS — E876 Hypokalemia: Secondary | ICD-10-CM | POA: Diagnosis present

## 2013-06-20 DIAGNOSIS — IMO0002 Reserved for concepts with insufficient information to code with codable children: Secondary | ICD-10-CM | POA: Diagnosis not present

## 2013-06-20 DIAGNOSIS — R404 Transient alteration of awareness: Secondary | ICD-10-CM | POA: Diagnosis not present

## 2013-06-20 DIAGNOSIS — R41841 Cognitive communication deficit: Secondary | ICD-10-CM | POA: Diagnosis not present

## 2013-06-20 DIAGNOSIS — Z951 Presence of aortocoronary bypass graft: Secondary | ICD-10-CM | POA: Diagnosis not present

## 2013-06-20 DIAGNOSIS — D649 Anemia, unspecified: Secondary | ICD-10-CM | POA: Diagnosis not present

## 2013-06-20 DIAGNOSIS — J9620 Acute and chronic respiratory failure, unspecified whether with hypoxia or hypercapnia: Secondary | ICD-10-CM | POA: Diagnosis not present

## 2013-06-20 DIAGNOSIS — R5381 Other malaise: Secondary | ICD-10-CM | POA: Diagnosis present

## 2013-06-20 DIAGNOSIS — E236 Other disorders of pituitary gland: Secondary | ICD-10-CM | POA: Diagnosis not present

## 2013-06-20 DIAGNOSIS — J984 Other disorders of lung: Secondary | ICD-10-CM | POA: Diagnosis not present

## 2013-06-20 DIAGNOSIS — I509 Heart failure, unspecified: Secondary | ICD-10-CM | POA: Diagnosis present

## 2013-06-20 DIAGNOSIS — Z87891 Personal history of nicotine dependence: Secondary | ICD-10-CM

## 2013-06-20 DIAGNOSIS — E43 Unspecified severe protein-calorie malnutrition: Secondary | ICD-10-CM | POA: Diagnosis present

## 2013-06-20 DIAGNOSIS — I5031 Acute diastolic (congestive) heart failure: Secondary | ICD-10-CM | POA: Diagnosis not present

## 2013-06-20 DIAGNOSIS — J811 Chronic pulmonary edema: Secondary | ICD-10-CM | POA: Diagnosis not present

## 2013-06-20 DIAGNOSIS — E871 Hypo-osmolality and hyponatremia: Secondary | ICD-10-CM | POA: Diagnosis not present

## 2013-06-20 DIAGNOSIS — Q230 Congenital stenosis of aortic valve: Secondary | ICD-10-CM | POA: Diagnosis not present

## 2013-06-20 DIAGNOSIS — H9190 Unspecified hearing loss, unspecified ear: Secondary | ICD-10-CM | POA: Diagnosis present

## 2013-06-20 DIAGNOSIS — M4800 Spinal stenosis, site unspecified: Secondary | ICD-10-CM | POA: Diagnosis present

## 2013-06-20 DIAGNOSIS — R918 Other nonspecific abnormal finding of lung field: Secondary | ICD-10-CM | POA: Diagnosis not present

## 2013-06-20 DIAGNOSIS — I4892 Unspecified atrial flutter: Secondary | ICD-10-CM | POA: Diagnosis not present

## 2013-06-20 DIAGNOSIS — R262 Difficulty in walking, not elsewhere classified: Secondary | ICD-10-CM | POA: Diagnosis not present

## 2013-06-20 DIAGNOSIS — J189 Pneumonia, unspecified organism: Secondary | ICD-10-CM | POA: Diagnosis not present

## 2013-06-20 DIAGNOSIS — I2510 Atherosclerotic heart disease of native coronary artery without angina pectoris: Secondary | ICD-10-CM | POA: Diagnosis not present

## 2013-06-20 DIAGNOSIS — J9601 Acute respiratory failure with hypoxia: Secondary | ICD-10-CM

## 2013-06-20 DIAGNOSIS — I10 Essential (primary) hypertension: Secondary | ICD-10-CM | POA: Diagnosis present

## 2013-06-20 DIAGNOSIS — I359 Nonrheumatic aortic valve disorder, unspecified: Secondary | ICD-10-CM | POA: Diagnosis present

## 2013-06-20 DIAGNOSIS — I951 Orthostatic hypotension: Secondary | ICD-10-CM | POA: Diagnosis not present

## 2013-06-20 DIAGNOSIS — I5022 Chronic systolic (congestive) heart failure: Secondary | ICD-10-CM | POA: Diagnosis not present

## 2013-06-20 DIAGNOSIS — T819XXA Unspecified complication of procedure, initial encounter: Secondary | ICD-10-CM | POA: Diagnosis not present

## 2013-06-20 DIAGNOSIS — E440 Moderate protein-calorie malnutrition: Secondary | ICD-10-CM | POA: Insufficient documentation

## 2013-06-20 DIAGNOSIS — R279 Unspecified lack of coordination: Secondary | ICD-10-CM | POA: Diagnosis not present

## 2013-06-20 DIAGNOSIS — I440 Atrioventricular block, first degree: Secondary | ICD-10-CM | POA: Diagnosis not present

## 2013-06-20 DIAGNOSIS — Z954 Presence of other heart-valve replacement: Secondary | ICD-10-CM | POA: Diagnosis not present

## 2013-06-20 LAB — APTT: aPTT: 30 seconds (ref 24–37)

## 2013-06-20 LAB — CBC
HCT: 25.4 % — ABNORMAL LOW (ref 39.0–52.0)
Hemoglobin: 9.4 g/dL — ABNORMAL LOW (ref 13.0–17.0)
MCHC: 37 g/dL — ABNORMAL HIGH (ref 30.0–36.0)
MCV: 88.2 fL (ref 78.0–100.0)
WBC: 16.1 10*3/uL — ABNORMAL HIGH (ref 4.0–10.5)

## 2013-06-20 LAB — COMPREHENSIVE METABOLIC PANEL
ALT: 16 U/L (ref 0–53)
Albumin: 2.3 g/dL — ABNORMAL LOW (ref 3.5–5.2)
BUN: 14 mg/dL (ref 6–23)
BUN: 15 mg/dL (ref 6–23)
CO2: 23 mEq/L (ref 19–32)
Calcium: 7.9 mg/dL — ABNORMAL LOW (ref 8.4–10.5)
Creatinine, Ser: 0.98 mg/dL (ref 0.50–1.35)
Creatinine, Ser: 1.04 mg/dL (ref 0.50–1.35)
GFR calc Af Amer: 72 mL/min — ABNORMAL LOW (ref >90)
GFR calc non Af Amer: 62 mL/min — ABNORMAL LOW (ref >90)
Glucose, Bld: 98 mg/dL (ref 70–99)
Sodium: 118 mEq/L — CL (ref 135–145)
Total Bilirubin: 0.9 mg/dL (ref 0.3–1.2)
Total Protein: 5.8 g/dL — ABNORMAL LOW (ref 6.0–8.3)

## 2013-06-20 LAB — CBC WITH DIFFERENTIAL/PLATELET
Basophils Relative: 0 % (ref 0–1)
Eosinophils Absolute: 0.1 10*3/uL (ref 0.0–0.7)
HCT: 23.8 % — ABNORMAL LOW (ref 39.0–52.0)
Hemoglobin: 8.8 g/dL — ABNORMAL LOW (ref 13.0–17.0)
MCH: 33 pg (ref 26.0–34.0)
MCHC: 37 g/dL — ABNORMAL HIGH (ref 30.0–36.0)
Monocytes Absolute: 1.1 10*3/uL — ABNORMAL HIGH (ref 0.1–1.0)
Monocytes Relative: 6 % (ref 3–12)

## 2013-06-20 LAB — TROPONIN I: Troponin I: <0.3 ng/mL (ref <0.30)

## 2013-06-20 LAB — OSMOLALITY, URINE: Osmolality, Ur: 261 mOsm/kg — ABNORMAL LOW (ref 390–1090)

## 2013-06-20 LAB — STREP PNEUMONIAE URINARY ANTIGEN: Strep Pneumo Urinary Antigen: NEGATIVE

## 2013-06-20 LAB — PRO B NATRIURETIC PEPTIDE: Pro B Natriuretic peptide (BNP): 2996 pg/mL — ABNORMAL HIGH (ref 0–450)

## 2013-06-20 LAB — PROTIME-INR: Prothrombin Time: 16.1 seconds — ABNORMAL HIGH (ref 11.6–15.2)

## 2013-06-20 MED ORDER — PANTOPRAZOLE SODIUM 40 MG PO TBEC
40.0000 mg | DELAYED_RELEASE_TABLET | Freq: Every day | ORAL | Status: DC
  Administered 2013-06-20 – 2013-07-02 (×13): 40 mg via ORAL
  Filled 2013-06-20 (×13): qty 1

## 2013-06-20 MED ORDER — SODIUM CHLORIDE 0.9 % IJ SOLN
3.0000 mL | Freq: Two times a day (BID) | INTRAMUSCULAR | Status: DC
  Administered 2013-06-21 – 2013-07-01 (×20): 3 mL via INTRAVENOUS

## 2013-06-20 MED ORDER — AMIODARONE HCL 200 MG PO TABS
200.0000 mg | ORAL_TABLET | Freq: Two times a day (BID) | ORAL | Status: DC
  Administered 2013-06-20 – 2013-06-21 (×3): 200 mg via ORAL
  Filled 2013-06-20 (×5): qty 1

## 2013-06-20 MED ORDER — NITROGLYCERIN 2 % TD OINT
1.0000 [in_us] | TOPICAL_OINTMENT | Freq: Once | TRANSDERMAL | Status: AC
  Administered 2013-06-20: 1 [in_us] via TOPICAL
  Filled 2013-06-20: qty 1

## 2013-06-20 MED ORDER — VANCOMYCIN HCL IN DEXTROSE 750-5 MG/150ML-% IV SOLN
750.0000 mg | Freq: Two times a day (BID) | INTRAVENOUS | Status: DC
  Administered 2013-06-20 – 2013-06-22 (×5): 750 mg via INTRAVENOUS
  Filled 2013-06-20 (×7): qty 150

## 2013-06-20 MED ORDER — FERROUS FUMARATE 325 (106 FE) MG PO TABS
1.0000 | ORAL_TABLET | Freq: Two times a day (BID) | ORAL | Status: DC
  Administered 2013-06-20 – 2013-07-02 (×23): 106 mg via ORAL
  Filled 2013-06-20 (×26): qty 1

## 2013-06-20 MED ORDER — ATORVASTATIN CALCIUM 20 MG PO TABS
20.0000 mg | ORAL_TABLET | Freq: Every day | ORAL | Status: DC
  Administered 2013-06-21 – 2013-06-23 (×3): 20 mg via ORAL
  Filled 2013-06-20 (×4): qty 1

## 2013-06-20 MED ORDER — FOLIC ACID 1 MG PO TABS
1.0000 mg | ORAL_TABLET | Freq: Every day | ORAL | Status: DC
  Administered 2013-06-21 – 2013-07-02 (×11): 1 mg via ORAL
  Filled 2013-06-20 (×12): qty 1

## 2013-06-20 MED ORDER — CEFEPIME HCL 1 G IJ SOLR
1.0000 g | Freq: Two times a day (BID) | INTRAMUSCULAR | Status: DC
  Administered 2013-06-20 – 2013-06-22 (×5): 1 g via INTRAVENOUS
  Filled 2013-06-20 (×6): qty 1

## 2013-06-20 MED ORDER — METOPROLOL TARTRATE 12.5 MG HALF TABLET
12.5000 mg | ORAL_TABLET | Freq: Two times a day (BID) | ORAL | Status: DC
  Administered 2013-06-20 – 2013-07-02 (×24): 12.5 mg via ORAL
  Filled 2013-06-20 (×25): qty 1

## 2013-06-20 MED ORDER — ONDANSETRON HCL 4 MG/2ML IJ SOLN
4.0000 mg | Freq: Four times a day (QID) | INTRAMUSCULAR | Status: DC | PRN
  Filled 2013-06-20: qty 2

## 2013-06-20 MED ORDER — ENSURE PUDDING PO PUDG
1.0000 | Freq: Three times a day (TID) | ORAL | Status: DC
  Administered 2013-06-21 – 2013-06-26 (×11): 1 via ORAL

## 2013-06-20 MED ORDER — ACETAMINOPHEN 325 MG PO TABS: 650.0000 mg | ORAL_TABLET | ORAL | Status: DC | PRN

## 2013-06-20 MED ORDER — DARIFENACIN HYDROBROMIDE ER 7.5 MG PO TB24
7.5000 mg | ORAL_TABLET | Freq: Every day | ORAL | Status: DC
  Administered 2013-06-21 – 2013-07-02 (×11): 7.5 mg via ORAL
  Filled 2013-06-20 (×12): qty 1

## 2013-06-20 MED ORDER — FUROSEMIDE 10 MG/ML IJ SOLN
40.0000 mg | Freq: Two times a day (BID) | INTRAMUSCULAR | Status: DC
  Administered 2013-06-21 – 2013-06-22 (×3): 40 mg via INTRAVENOUS
  Filled 2013-06-20 (×5): qty 4

## 2013-06-20 MED ORDER — FUROSEMIDE 10 MG/ML IJ SOLN
60.0000 mg | Freq: Once | INTRAMUSCULAR | Status: AC
  Administered 2013-06-20: 60 mg via INTRAVENOUS
  Filled 2013-06-20: qty 6

## 2013-06-20 MED ORDER — FUROSEMIDE 10 MG/ML IJ SOLN
INTRAMUSCULAR | Status: AC
  Administered 2013-06-20: 40 mg
  Filled 2013-06-20: qty 4

## 2013-06-20 MED ORDER — HEPARIN SODIUM (PORCINE) 5000 UNIT/ML IJ SOLN
5000.0000 [IU] | Freq: Three times a day (TID) | INTRAMUSCULAR | Status: DC
  Administered 2013-06-20 – 2013-07-02 (×34): 5000 [IU] via SUBCUTANEOUS
  Filled 2013-06-20 (×38): qty 1

## 2013-06-20 MED ORDER — SODIUM CHLORIDE 0.9 % IV SOLN
250.0000 mL | INTRAVENOUS | Status: DC | PRN
  Administered 2013-06-22: 250 mL via INTRAVENOUS

## 2013-06-20 MED ORDER — OMEPRAZOLE MAGNESIUM 20 MG PO TBEC: 20.0000 mg | DELAYED_RELEASE_TABLET | Freq: Every day | ORAL | Status: DC

## 2013-06-20 MED ORDER — ASPIRIN EC 325 MG PO TBEC
325.0000 mg | DELAYED_RELEASE_TABLET | Freq: Every day | ORAL | Status: DC
  Administered 2013-06-21 – 2013-07-02 (×11): 325 mg via ORAL
  Filled 2013-06-20 (×12): qty 1

## 2013-06-20 MED ORDER — SODIUM CHLORIDE 0.9 % IJ SOLN
3.0000 mL | INTRAMUSCULAR | Status: DC | PRN
  Administered 2013-06-23: 3 mL via INTRAVENOUS

## 2013-06-20 MED ORDER — DOXAZOSIN MESYLATE 4 MG PO TABS
4.0000 mg | ORAL_TABLET | Freq: Every day | ORAL | Status: DC
  Administered 2013-06-20 – 2013-06-22 (×3): 4 mg via ORAL
  Filled 2013-06-20 (×4): qty 1

## 2013-06-20 MED ORDER — TRAMADOL HCL 50 MG PO TABS
50.0000 mg | ORAL_TABLET | Freq: Four times a day (QID) | ORAL | Status: DC | PRN
  Administered 2013-06-25: 50 mg via ORAL
  Filled 2013-06-20: qty 1

## 2013-06-20 MED ORDER — FUROSEMIDE 10 MG/ML IJ SOLN: 40.0000 mg | Freq: Once | INTRAMUSCULAR | Status: AC

## 2013-06-20 NOTE — ED Notes (Signed)
[facility] advised that they have several transports in front of pt, [facility] contacted for transport to [facility]

## 2013-06-20 NOTE — Progress Notes (Signed)
[facility] Medical Consultation  Hasnain Manheim ZOX:[id number] DOB: 22-Dec-1923 DOA: 06/20/2013 PCP: Georgann Housekeeper, MD   Requesting physician: Mayford Knife, M.D. Date of consultation: 8/24 Reason for consultation: pneumonia and hyponatremia  Impression/Recommendations  HCAP:  Patient has cough leukocytosis and infiltrates v. Edema on CXR.  I agree, likely combination CHF and pneumonia.  Will order Vancomycin and cefepime.    Hyponatremia:  Had sodiums in the mid 120s to low 130s last hospitalization. Today, lower at 116.  Likely due to CHF, but Dr. Mayford Knife feels patient not terribly volume overloaded. Weight fairly stable from discharge, and proBNP about the same as discharge.   However, Does have significant leg edema, and CXR likely combination of pulmonary edema and pneumonia.   Could also be SIADH, so will check serum and urine osmolality (though urine studies may not be reliable due to lasix).  TSH last admission ok.  Will also check am cortisol.  Ok to give lasix.  May help. Will fluid restrict.  Acute on chronic respiratory failure:  Secondary to CHF and pneumonia.  Maintaining sats in the 90s on [place] O2.  Anemia: unchanged from discharge  Dr. Donette Larry to follow up tomorrow.  Thank you for the consult.  Chief Complaint: shortness of breath  HPI: This is an 79yo WM with a history of HTN, GERD and severe AS. An echo in 2008 showed only mild AS. He was admitted the beginning of August when he started complaining of vertigo and increased ODE along with mild chest pressure and was found to have severe AS by echo. On July 16th he underwent cardiac cath showing moderate CAD of the LAD, 80% diagonal, 80% OM and normal right heart pressures. 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 Tyrone Sage. Postop he had some volume overload with CHF and post op afibHe was discharged to home on 8/19 to rehab at [facility]. He has not been ambulating well since then  and his appetite has been poor so he has had poor PO intake. For the past 2 days he has had worsening SOB and nonproductive cough. He has also had some problems with dizziness and presyncope and has been less mobile He was noted yesterday to have lower O2 sats. He was sent to rehab on O2. He also has chronic LE edema which was present before surgery but has worsened since then. At [facility] ER he was noted to be in acute diastolic CHF with O2 sats 89% on 4L but improved to 96% on 6L O2.   Review of Systems: systems reviewed. As above otherwise negative.  Past Medical History  Diagnosis Date  . BPH (benign prostatic hypertrophy)   . Diverticulosis 2006  . Depression   . Anemia   . Spinal stenosis   . Lung nodules 2010  . Actinic keratosis, hx of   . Macular degeneration   . Hypertension   . Allergy     allergic rhinitis  . Hearing loss     wears bilateral hearing aids  . Inguinal hernia     left side -surgery planned  . GERD (gastroesophageal reflux disease)   . Arthritis     generalized-more back issues  . Severe aortic stenosis 04/16/2013  . Nodule of right lung 05/24/2013  . CHF (congestive heart failure)   . Shortness of breath   . Atrial flutter    Past Surgical History  Procedure Laterality Date  . Tonsillectomy  1933  .  Appendectomy  1947  . Neck surgery  1997    compressed nerve  . Skin biopsy  2003-2011    multiple   . Cataract extraction, bilateral      bilateral  . Inguinal hernia repair Left 02/11/2013    Procedure: LEFT LAPAROSCOPIC REPAIR INGUINAL HERNIA WITH MESH;  Surgeon: Ernestene Mention, MD;  Location: [facility] ORS;  Service: General;  Laterality: Left;  . Insertion of mesh Left 02/11/2013    Procedure: INSERTION OF MESH;  Surgeon: Ernestene Mention, MD;  Location: [facility];  Service: General;  Laterality: Left;  . Hernia repair  02/24/13    lap LIH repair  . Aortic valve replacement N/A 06/04/2013    Procedure: AORTIC VALVE REPLACEMENT (AVR);  Surgeon: Delight Ovens, MD;  Location: [facility] OR;  Service: Open Heart Surgery;  Laterality: N/A;  . Intraoperative transesophageal echocardiogram N/A 06/04/2013    Procedure: INTRAOPERATIVE TRANSESOPHAGEAL ECHOCARDIOGRAM;  Surgeon: Delight Ovens, MD;  Location: [facility] OR;  Service: Open Heart Surgery;  Laterality: N/A;  . Coronary artery bypass graft N/A 06/04/2013    Procedure: CORONARY ARTERY BYPASS GRAFTING (CABG);  Surgeon: Delight Ovens, MD;  Location: [facility] OR;  Service: Open Heart Surgery;  Laterality: N/A;  . Wedge resection Right 06/04/2013    Procedure: WEDGE RESECTION Right Lower Lobe;  Surgeon: Delight Ovens, MD;  Location: [facility] OR;  Service: Open Heart Surgery;  Laterality: Right;   Social History:  reports that he quit smoking about 45 years ago. His smoking use included Cigarettes. He has a 30 pack-year smoking history. He has never used smokeless tobacco. He reports that  drinks alcohol. He reports that he does not use illicit drugs.  No Known Allergies Family History  Problem Relation Age of Onset  . Prostate cancer Father   . Cancer Mother     passed away age 71    Prior to Admission medications   Medication Sig Start Date End Date Taking? Authorizing Provider  amiodarone (PACERONE) 200 MG tablet Take 1 tablet (200 mg total) by mouth 2 (two) times daily. 06/15/13  Yes Erin Barrett, PA-C  aspirin EC 325 MG EC tablet Take 1 tablet (325 mg total) by mouth daily. 06/15/13  Yes Erin Barrett, PA-C  atorvastatin (LIPITOR) 20 MG tablet Take 1 tablet (20 mg total) by mouth daily at 6 PM. 06/15/13  Yes Erin Barrett, PA-C  doxazosin (CARDURA) 4 MG tablet Take 4 mg by mouth at bedtime.   Yes Historical Provider, MD  ferrous fumarate (HEMOCYTE - 106 MG FE) 325 (106 FE) MG TABS tablet Take 1 tablet (106 mg of iron total) by mouth 2 (two) times daily. 06/15/13  Yes Erin Barrett, PA-C  folic acid (FOLVITE) 1 MG tablet Take 1 tablet (1 mg total) by mouth daily. 06/15/13  Yes Erin Barrett, PA-C  furosemide (LASIX)  40 MG tablet Take 1 tablet (40 mg total) by mouth daily. 06/15/13  Yes Erin Barrett, PA-C  metoprolol tartrate (LOPRESSOR) 25 MG tablet Take 12.5 mg by mouth 2 (two) times daily.   Yes Historical Provider, MD  Multiple Vitamin (MULTIVITAMIN WITH MINERALS) TABS Take 1 tablet by mouth daily.   Yes Historical Provider, MD  Multiple Vitamins-Minerals (PRESERVISION AREDS PO) Take 2 capsules by mouth daily.    Yes Historical Provider, MD  omeprazole (PRILOSEC OTC) 20 MG tablet Take 20 mg by mouth daily.   Yes Historical Provider, MD  potassium chloride SA (K-DUR,KLOR-CON) 20 MEQ tablet Take 1 tablet (20 mEq  total) by mouth daily. 06/15/13  Yes Erin Barrett, PA-C  solifenacin (VESICARE) 5 MG tablet Take 5 mg by mouth daily.   Yes Historical Provider, MD  fluticasone (FLONASE) 50 MCG/ACT nasal spray Place 2 sprays into the nose daily as needed for allergies.     Historical Provider, MD  loratadine (CLARITIN) 10 MG tablet Take 10 mg by mouth daily as needed for allergies.     Historical Provider, MD  traMADol (ULTRAM) 50 MG tablet Take 1 tablet (50 mg total) by mouth every 6 (six) hours as needed. 06/16/13   Kimber Relic, MD   Physical Exam: Blood pressure 113/48, pulse 60, temperature 98 F (36.7 C), temperature source Oral, resp. rate 24, height 5\' 7"  (1.702 m), weight 76.658 kg (169 lb), SpO2 93.00%. Filed Vitals:   06/20/13 1536  BP: 113/48  Pulse: 60  Temp:   Resp: 24   BP 120/49  Pulse 65  Temp(Src) 98 F (36.7 C) (Oral)  Resp 18  Ht 5\' 7"  (1.702 m)  Wt 76.658 kg (169 lb)  BMI 26.46 kg/m2  SpO2 93%  General Appearance:    Alert, cooperative, no distress, appears frail  Head:    Normocephalic, without obvious abnormality, atraumatic  Eyes:    PERRL, conjunctiva/corneas clear, EOM's intact, fundi    benign, both eyes          Nose:   Nares normal, septum midline, mucosa normal, no drainage   or sinus tenderness  Throat:   Slightly dry mucous membranes  Neck:   Supple, JVD present      Lungs:     Bilateral rales, left greater than right  Chest wall:    Incision without erythema or drainage  Heart:    Regular rate and rhythm, S1 and S2 normal, no murmur, rub   or gallop  Abdomen:     Soft, non-tender, bowel sounds active   Genitalia:    deferred  Rectal:    deferred  Extremities:   2-3+ pitting edema  Pulses:   2+ and symmetric all extremities  Skin:   No rash. Scaling on feet  Lymph nodes:   Cervical, supraclavicular, and axillary nodes normal  Neurologic:   CNII-XII intact. Normal strength, sensation and reflexes      throughout    Psych:  Normal affect  Labs on Admission:  Basic Metabolic Panel:  Recent Labs Lab 06/14/13 0503 06/15/13 0500 06/20/13 0944  NA 123* 126* 116*  K 3.3* 3.9 3.8  CL 88* 93* 83*  CO2 25 23 24   GLUCOSE 108* 95 102*  BUN 15 13 14   CREATININE 1.13 0.90 0.98  CALCIUM 7.9* 7.8* 7.8*   Liver Function Tests:  Recent Labs Lab 06/20/13 0944  AST 18  ALT 15  ALKPHOS 90  BILITOT 0.9  PROT 5.8*  ALBUMIN 2.3*   No results found for this basename: LIPASE, AMYLASE,  in the last 168 hours No results found for this basename: AMMONIA,  in the last 168 hours CBC:  Recent Labs Lab 06/14/13 1330 06/15/13 0500 06/20/13 0944  WBC 12.8* 12.9* 17.1*  NEUTROABS  --   --  15.3*  HGB 9.0* 8.7* 8.8*  HCT 24.4* 23.9* 23.8*  MCV 89.4 90.2 89.1  PLT 210 212 245   Cardiac Enzymes:  Recent Labs Lab 06/20/13 0944  TROPONINI <0.30   BNP: No components found with this basename: POCBNP,  CBG: No results found for this basename: GLUCAP,  in the last 168 hours  Radiological  Exams on Admission: Dg Chest Portable 1 View  06/20/2013   *RADIOLOGY REPORT*  Clinical Data: Shortness of breath.  Recent CABG.  PORTABLE CHEST - 1 VIEW  Comparison: 06/10/2013  Findings: Changes of CABG and AVR.  Stable cardiomegaly.  Diffuse interstitial and alveolar edema or infiltrates have developed bilaterally.  There is some patchy airspace consolidation in  the lung bases, left greater than right.  Suspect small pleural effusions.  IMPRESSION:  1.  New bilateral edema/infiltrates with small effusions. 2.  Stable cardiomegaly and postop changes.   Original Report Authenticated By: D. Andria Rhein, MD   EKG: Sinus rhythm with 1st degree A-V block Non-specific intra-ventricular conduction block Abnormal ECG When compared with ECG of 05-Jun-2013 07:07, Sinus rhythm has replaced Atrial fibrillation QRS duration has increased T wave inversion now evident in Anterior leads  Time spent: 60 minutes  Rockie Schnoor L [facility] Pager [phone]  If 7PM-7AM, please contact night-coverage [email or web address] Password [facility] 06/20/2013, 6:08 PM

## 2013-06-20 NOTE — ED Notes (Signed)
Pt family requesting something to drink for pt. Per Dr. Lynelle Doctor pt can have ice chips, ice chips given

## 2013-06-20 NOTE — ED Notes (Signed)
Pt given lunch tray, family updated on plan of care.

## 2013-06-20 NOTE — ED Notes (Signed)
Report given to [facility] 

## 2013-06-20 NOTE — H&P (Signed)
Admit date: 06/20/2013 Referring Physician Dr. Lynelle Doctor Primary Cardiologist Dr. Anne Fu Chief complaint/reason for admission: SOB with hyoxia  HPI: This is an 79yo WM with a history of HTN, GERD and severe AS. An echo in 2008 showed only mild AS. He was admitted the beginning of August when he started complaining of vertigo and increased ODE along with mild chest pressure and was found to have severe AS by echo. On July 16th he underwent cardiac cath showing moderate CAD of the LAD, 80% diagonal, 80% OM and normal right heart pressures. 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 Tyrone Sage. Postop he had some volume overload with CHF and post op afibHe was discharged to home on 8/19 to rehab at [facility].  He has not been ambulating well since then and his appetite has been poor so he has had poor PO intake.  For the past 2 days he has had worsening SOB.  He has also had some problems with dizziness and presyncope and has been less mobile  He was noted yesterday to have lower O2 sats.  He was sent to rehab on O2.  He also has chronic LE edema which was present before surgery but has worsened since then.  At [facility] ER he was noted to be in acute diastolic CHF with O2 sats 89% on 4L but improved to 96% on 6L O2.  Currently he is on 3.5L O2 with O2 sats of 92%.  He denies any chest pain, nausea, vomiting or diaphoresis. He was hypotensive in the ER by he had NTPaste placed in the ER.    PMH:    Past Medical History  Diagnosis Date  . BPH (benign prostatic hypertrophy)   . Diverticulosis 2006  . Depression   . Anemia   . Spinal stenosis   . Lung nodules 2010  . Actinic keratosis, hx of   . Macular degeneration   . Hypertension   . Allergy     allergic rhinitis  . Hearing loss     wears bilateral hearing aids  . Inguinal hernia     left side -surgery planned  . GERD (gastroesophageal reflux disease)   . Arthritis     generalized-more back issues   . Severe aortic stenosis 04/16/2013  . Nodule of right lung 05/24/2013  . CHF (congestive heart failure)   . Shortness of breath   . Atrial flutter     PSH:    Past Surgical History  Procedure Laterality Date  . Tonsillectomy  1933  . Appendectomy  1947  . Neck surgery  1997    compressed nerve  . Skin biopsy  2003-2011    multiple   . Cataract extraction, bilateral      bilateral  . Inguinal hernia repair Left 02/11/2013    Procedure: LEFT LAPAROSCOPIC REPAIR INGUINAL HERNIA WITH MESH;  Surgeon: Ernestene Mention, MD;  Location: [facility]S;  Service: General;  Laterality: Left;  . Insertion of mesh Left 02/11/2013    Procedure: INSERTION OF MESH;  Surgeon: Ernestene Mention, MD;  Location: [facility]S;  Service: General;  Laterality: Left;  . Hernia repair  02/24/13    lap LIH repair  . Aortic valve replacement N/A 06/04/2013    Procedure: AORTIC VALVE REPLACEMENT (AVR);  Surgeon: Delight Ovens, MD;  Location: [facility] OR;  Service: Open Heart Surgery;  Laterality: N/A;  . Intraoperative transesophageal echocardiogram N/A 06/04/2013    Procedure:  INTRAOPERATIVE TRANSESOPHAGEAL ECHOCARDIOGRAM;  Surgeon: Delight Ovens, MD;  Location: [facility] OR;  Service: Open Heart Surgery;  Laterality: N/A;  . Coronary artery bypass graft N/A 06/04/2013    Procedure: CORONARY ARTERY BYPASS GRAFTING (CABG);  Surgeon: Delight Ovens, MD;  Location: [facility] OR;  Service: Open Heart Surgery;  Laterality: N/A;  . Wedge resection Right 06/04/2013    Procedure: WEDGE RESECTION Right Lower Lobe;  Surgeon: Delight Ovens, MD;  Location: [facility] OR;  Service: Open Heart Surgery;  Laterality: Right;    ALLERGIES:   Review of patient's allergies indicates no known allergies.  Prior to Admit Meds:   Prescriptions prior to admission  Medication Sig Dispense Refill  . amiodarone (PACERONE) 200 MG tablet Take 1 tablet (200 mg total) by mouth 2 (two) times daily.      Marland Kitchen aspirin EC 325 MG EC tablet Take 1 tablet (325 mg total) by mouth  daily.  30 tablet  0  . atorvastatin (LIPITOR) 20 MG tablet Take 1 tablet (20 mg total) by mouth daily at 6 PM.      . doxazosin (CARDURA) 4 MG tablet Take 4 mg by mouth at bedtime.      . ferrous fumarate (HEMOCYTE - 106 MG FE) 325 (106 FE) MG TABS tablet Take 1 tablet (106 mg of iron total) by mouth 2 (two) times daily.  30 each  0  . folic acid (FOLVITE) 1 MG tablet Take 1 tablet (1 mg total) by mouth daily.      . furosemide (LASIX) 40 MG tablet Take 1 tablet (40 mg total) by mouth daily.  30 tablet    . metoprolol tartrate (LOPRESSOR) 25 MG tablet Take 12.5 mg by mouth 2 (two) times daily.      . Multiple Vitamin (MULTIVITAMIN WITH MINERALS) TABS Take 1 tablet by mouth daily.      . Multiple Vitamins-Minerals (PRESERVISION AREDS PO) Take 2 capsules by mouth daily.       Marland Kitchen omeprazole (PRILOSEC OTC) 20 MG tablet Take 20 mg by mouth daily.      . potassium chloride SA (K-DUR,KLOR-CON) 20 MEQ tablet Take 1 tablet (20 mEq total) by mouth daily.      . solifenacin (VESICARE) 5 MG tablet Take 5 mg by mouth daily.      . fluticasone (FLONASE) 50 MCG/ACT nasal spray Place 2 sprays into the nose daily as needed for allergies.       Marland Kitchen loratadine (CLARITIN) 10 MG tablet Take 10 mg by mouth daily as needed for allergies.       Marland Kitchen traMADol (ULTRAM) 50 MG tablet Take 1 tablet (50 mg total) by mouth every 6 (six) hours as needed.  120 tablet  5   Family HX:    Family History  Problem Relation Age of Onset  . Prostate cancer Father   . Cancer Mother     passed away age 10   Social HX:    History   Social History  . Marital Status: Married    Spouse Name: N/A    Number of Children: 1  . Years of Education: N/A   Occupational History  . retired from [employer]    Social History Main Topics  . Smoking status: Former Smoker -- 1.50 packs/day for 20 years    Types: Cigarettes    Quit date: 10/29/1967  . Smokeless tobacco: Never Used  . Alcohol Use: Yes     Comment: occasionally  . Drug Use: No   .  Sexual Activity: Not Currently   Other Topics Concern  . Not on file   Social History Narrative  . No narrative on file     ROS:  All 11 ROS were addressed and are negative except what is stated in the HPI  PHYSICAL EXAM Filed Vitals:   06/20/13 1536  BP: 113/48  Pulse: 60  Temp:   Resp: 24   General: Well developed, well nourished, in no acute distress Head: Eyes PERRLA, No xanthomas.   Normal cephalic and atramatic  Lungs:   Crackles at bases and anteriorly bilaterally Heart:   HRRR S1 S2 Pulses are 2+ & equal.            No carotid bruit. No JVD.  No abdominal bruits. No femoral bruits. Abdomen: Bowel sounds are positive, abdomen soft and non-tender without masses or                  Hernia's noted. Msk:  Back normal, normal gait. Normal strength and tone for age. Extremities:   2+ edema L>R Neuro: Alert and oriented X 3. Psych:  Good affect, responds appropriately   Labs:   Lab Results  Component Value Date   WBC 17.1* 06/20/2013   HGB 8.8* 06/20/2013   HCT 23.8* 06/20/2013   MCV 89.1 06/20/2013   PLT 245 06/20/2013    Recent Labs Lab 06/20/13 0944  NA 116*  K 3.8  CL 83*  CO2 24  BUN 14  CREATININE 0.98  CALCIUM 7.8*  PROT 5.8*  BILITOT 0.9  ALKPHOS 90  ALT 15  AST 18  GLUCOSE 102*   Lab Results  Component Value Date   CKTOTAL 61 01/19/2009   CKMB 1.3 01/19/2009   TROPONINI <0.30 06/20/2013   No results found for this basename: PTT   Lab Results  Component Value Date   INR 1.27 06/07/2013   INR 1.55* 06/04/2013   INR 1.02 06/03/2013     Lab Results  Component Value Date   CHOL 120 06/04/2013   CHOL  Value: 148        ATP III CLASSIFICATION:  <200     mg/dL   Desirable  782-956  mg/dL   Borderline High  >=213    mg/dL   High        0/86/5784   Lab Results  Component Value Date   HDL 50 06/04/2013   HDL 44 01/19/2009   Lab Results  Component Value Date   LDLCALC 60 06/04/2013   LDLCALC  Value: 96        Total Cholesterol/HDL:CHD Risk Coronary  Heart Disease Risk Table                     Men   Women  1/2 Average Risk   3.4   3.3  Average Risk       5.0   4.4  2 X Average Risk   9.6   7.1  3 X Average Risk  23.4   11.0        Use the calculated Patient Ratio above and the CHD Risk Table to determine the patient's CHD Risk.        ATP III CLASSIFICATION (LDL):  <100     mg/dL   Optimal  696-295  mg/dL   Near or Above                    Optimal  130-159  mg/dL   Borderline  160-189  mg/dL   High  >119     mg/dL   Very High 1/47/8295   Lab Results  Component Value Date   TRIG 51 06/04/2013   TRIG 39 01/19/2009   Lab Results  Component Value Date   CHOLHDL 2.4 06/04/2013   CHOLHDL 3.4 01/19/2009   No results found for this basename: LDLDIRECT      Radiology:  *RADIOLOGY REPORT*  Clinical Data: Shortness of breath. Recent CABG.  PORTABLE CHEST - 1 VIEW  Comparison: 06/10/2013  Findings: Changes of CABG and AVR. Stable cardiomegaly. Diffuse  interstitial and alveolar edema or infiltrates have developed  bilaterally. There is some patchy airspace consolidation in the  lung bases, left greater than right. Suspect small pleural  effusions.  IMPRESSION:  1. New bilateral edema/infiltrates with small effusions.  2. Stable cardiomegaly and postop changes.  Original Report Authenticated By: D. Andria Rhein, MD   EKG:  NSR with nonspecific IVCD  ASSESSMENT: 1.  ? Acute on chronic systolic CHF with mildly elevated BNP ( 2342 to 2996) from prior BNP at last admission.  His chest xray shows infiltrated with elevated WBC so unsure how much acute CHF is present given his BNP was similar on last admission.  I suspect some of his volume overload is due to hypoalbuminemia 2.  Acute respiratory failure secondary to ?CHFand possible PNA 3.  Infiltrates on chest xray with elevated WBC compared to discharge - probable PNA 4.  Hypotension resolved 5.  Severe AS s/p AVR 6.  CAD s/p 1 vessel CABG 7.  Hamartoma s/p right lung resection 8.   Hypoalbuminemia due to protein malnutrition from poor PO intake and recent surgery  PLAN:   1.  Admit to CCU 2.  2D echo to assess AV and LVF 3.  Hospitalist consult for treatment of possible hospital acquired PNA and hyponatremia 4.  Ensure supplements 5.  Will hold on further IV Lasix for now due to severe hyponatremia with low chloride and hypotension  Quintella Reichert, MD  06/20/2013  5:01 PM

## 2013-06-20 NOTE — ED Notes (Signed)
Report given to [facility] RN [id number]

## 2013-06-20 NOTE — ED Notes (Signed)
[facility] here to transport pt. °

## 2013-06-20 NOTE — ED Notes (Signed)
CRITICAL VALUE ALERT  Critical value received:  Sodium 116  Date of notification:  06/20/2013  Time of notification:  1017  Critical value read back: yes  Nurse who received alert:  Dr. Lynelle Doctor   MD notified (1st page):  10:17  Time of first page:  10:17  MD notified (2nd page):  Time of second page:  Responding MD:  Dr. Lynelle Doctor   Time MD responded:  10:17

## 2013-06-20 NOTE — ED Notes (Signed)
Pt reports had aortic valve replacement Aug 8th.  Has been at [facility] center recently and was sent here for SOB.  Reports nonproductive cough for the past few days.  02 sat 86% on 2liters, increased to 94% on 5 liters.  Pt also has chf and was given lasix 40mg .  [facility] staff reports pt's has coarse crackles in lungs, rr 28, sodium 126, bnp 2342.

## 2013-06-20 NOTE — ED Notes (Signed)
Family requesting lunch tray for pt, states that pt has not eaten good in 2-3 days, states "he will not get better unless he gets to eat", Dr. Lynelle Doctor notified, advised to order low sodium diet tray for pt. dietary notified, tray ordered.

## 2013-06-20 NOTE — Progress Notes (Signed)
CRITICAL VALUE ALERT  Critical value received: Na 118  Date of notification:  06-20-13  Time of notification:  2133  Critical value read back:yes  Nurse who received alert:  Neville Route, RN  MD notified (1st page):  Dr. Mayford Knife  Time of first page:  [phone]  MD aware of previous Na of 116

## 2013-06-20 NOTE — ED Provider Notes (Signed)
CSN: [id number]     Arrival date & time 06/20/13  0907 History    This chart was scribed for Ward Givens, MD,  by Ashley Jacobs, ED Scribe. The patient was seen in room APA02/APA02 and the patient's care was started at 9:53 AM   First MD Initiated Contact with Patient 06/20/13 0914     Chief Complaint  Patient presents with  . Shortness of Breath   (Consider location/radiation/quality/duration/timing/severity/associated sxs/prior Treatment) HPI HPI Comments: J Tafari Humiston is a 79 y.o. male who presents to the Emergency Department complaining of SOB for two days and worsened today. He states his mobility has been less because he has been feeling dizzy like he is going to pass out for a couple of days when he gets up. He states that yesterday his oxygen levels were low and he was experiencing SOB,  and fatigued that would not resolve. He states he was sleeping a lot. Pt mentions having trouble walking or standing for the past several days due to dizziness which was a feeling he was going to pass out  and feeling tired. He reports that he recently undergoing an aortic valve replacement, coronary artery bypass graft and wedge resection on 06/04/13. Pt uses oxygen at his rehabilitation facility, but was not using it at home before his surgery. He also has diffuse lower leg edema that started last week and has worsened after surgery. Pt has a hx of atrial flutter, aortic stenosis, and CHF. He denies chest pain except for the pain he has from his surgery, pt has his Heart Pillow that he keeps on his chest to help when he moves.     Pt's PCP is Dr. Georgann Housekeeper Cardiologist Dr Anne Fu CV Surgeon Dr Sena Hitch  Past Medical History  Diagnosis Date  . BPH (benign prostatic hypertrophy)   . Diverticulosis 2006  . Depression   . Anemia   . Spinal stenosis   . Lung nodules 2010  . Actinic keratosis, hx of   . Macular degeneration   . Hypertension   . Allergy     allergic rhinitis  . Hearing  loss     wears bilateral hearing aids  . Inguinal hernia     left side -surgery planned  . GERD (gastroesophageal reflux disease)   . Arthritis     generalized-more back issues  . Severe aortic stenosis 04/16/2013  . Nodule of right lung 05/24/2013  . CHF (congestive heart failure)   . Shortness of breath   . Atrial flutter    Past Surgical History  Procedure Laterality Date  . Tonsillectomy  1933  . Appendectomy  1947  . Neck surgery  1997    compressed nerve  . Skin biopsy  2003-2011    multiple   . Cataract extraction, bilateral      bilateral  . Inguinal hernia repair Left 02/11/2013    Procedure: LEFT LAPAROSCOPIC REPAIR INGUINAL HERNIA WITH MESH;  Surgeon: Ernestene Mention, MD;  Location: [facility] ORS;  Service: General;  Laterality: Left;  . Insertion of mesh Left 02/11/2013    Procedure: INSERTION OF MESH;  Surgeon: Ernestene Mention, MD;  Location: [facility];  Service: General;  Laterality: Left;  . Hernia repair  02/24/13    lap LIH repair  . Aortic valve replacement N/A 06/04/2013    Procedure: AORTIC VALVE REPLACEMENT (AVR);  Surgeon: Delight Ovens, MD;  [facility] OR;  Service: Open Heart Surgery;  Laterality: N/A;  . Intraoperative transesophageal echocardiogram  N/A 06/04/2013    Procedure: INTRAOPERATIVE TRANSESOPHAGEAL ECHOCARDIOGRAM;  Surgeon: Delight Ovens, MD;  Location: [facility] OR;  Service: Open Heart Surgery;  Laterality: N/A;  . Coronary artery bypass graft N/A 06/04/2013    Procedure: CORONARY ARTERY BYPASS GRAFTING (CABG);  Surgeon: Delight Ovens, MD;  Location: [facility] OR;  Service: Open Heart Surgery;  Laterality: N/A;  . Wedge resection Right 06/04/2013    Procedure: WEDGE RESECTION Right Lower Lobe;  Surgeon: Delight Ovens, MD;  Location: [facility] OR;  Service: Open Heart Surgery;  Laterality: Right;   Family History  Problem Relation Age of Onset  . Prostate cancer Father   . Cancer Mother     passed away age 78   History  Substance Use Topics  . Smoking  status: Former Smoker -- 1.50 packs/day for 20 years    Types: Cigarettes    Quit date: 10/29/1967  . Smokeless tobacco: Never Used  . Alcohol Use: Yes     Comment: occasionally  was living at home with his wife, is in a NH for rehab after his surgery. States he has only been on oxygen after his surgery.  Review of Systems  Constitutional: Negative for fever.  Respiratory: Positive for shortness of breath. Negative for cough.   Cardiovascular: Positive for leg swelling.  Neurological: Positive for weakness.  All other systems reviewed and are negative.    Allergies  Review of patient's allergies indicates no known allergies.  Home Medications   Current Outpatient Rx  Name  Route  Sig  Dispense  Refill  . amiodarone (PACERONE) 200 MG tablet   Oral   Take 1 tablet (200 mg total) by mouth 2 (two) times daily.         Marland Kitchen aspirin EC 325 MG EC tablet   Oral   Take 1 tablet (325 mg total) by mouth daily.   30 tablet   0   . atorvastatin (LIPITOR) 20 MG tablet   Oral   Take 1 tablet (20 mg total) by mouth daily at 6 PM.         . doxazosin (CARDURA) 4 MG tablet   Oral   Take 4 mg by mouth at bedtime.         . ferrous fumarate (HEMOCYTE - 106 MG FE) 325 (106 FE) MG TABS tablet   Oral   Take 1 tablet (106 mg of iron total) by mouth 2 (two) times daily.   30 each   0   . folic acid (FOLVITE) 1 MG tablet   Oral   Take 1 tablet (1 mg total) by mouth daily.         . furosemide (LASIX) 40 MG tablet   Oral   Take 1 tablet (40 mg total) by mouth daily.   30 tablet      . metoprolol tartrate (LOPRESSOR) 25 MG tablet   Oral   Take 12.5 mg by mouth 2 (two) times daily.         . Multiple Vitamin (MULTIVITAMIN WITH MINERALS) TABS   Oral   Take 1 tablet by mouth daily.         . Multiple Vitamins-Minerals (PRESERVISION AREDS PO)   Oral   Take 2 capsules by mouth daily.          Marland Kitchen omeprazole (PRILOSEC OTC) 20 MG tablet   Oral   Take 20 mg by mouth  daily.         . potassium chloride SA (K-DUR,KLOR-CON)  20 MEQ tablet   Oral   Take 1 tablet (20 mEq total) by mouth daily.         . solifenacin (VESICARE) 5 MG tablet   Oral   Take 5 mg by mouth daily.         . fluticasone (FLONASE) 50 MCG/ACT nasal spray   Nasal   Place 2 sprays into the nose daily as needed for allergies.          Marland Kitchen loratadine (CLARITIN) 10 MG tablet   Oral   Take 10 mg by mouth daily as needed for allergies.          Marland Kitchen traMADol (ULTRAM) 50 MG tablet   Oral   Take 1 tablet (50 mg total) by mouth every 6 (six) hours as needed.   120 tablet   5    BP 115/55  Pulse 60  Temp(Src) 97 F (36.1 C) (Oral)  Resp 23  Ht 5\' 7"  (1.702 m)  Wt 169 lb (76.658 kg)  BMI 26.46 kg/m2  SpO2 93%  Vital signs normal   Physical Exam  Nursing note and vitals reviewed. Constitutional: He is oriented to person, place, and time. He appears well-developed and well-nourished.  Non-toxic appearance. He does not appear ill. No distress.  HENT:  Head: Normocephalic and atraumatic.  Right Ear: External ear normal.  Left Ear: External ear normal.  Nose: Nose normal. No mucosal edema or rhinorrhea.  Mouth/Throat: Oropharynx is clear and moist and mucous membranes are normal. No dental abscesses or edematous.  Eyes: Conjunctivae and EOM are normal. Pupils are equal, round, and reactive to light.  Neck: Normal range of motion and full passive range of motion without pain. Neck supple.  Cardiovascular: Normal rate, regular rhythm and normal heart sounds.  Exam reveals no gallop and no friction rub.   No murmur heard. Pulmonary/Chest: Effort normal. No respiratory distress. He has decreased breath sounds. He has no wheezes. He has no rhonchi. He has no rales. He exhibits no tenderness and no crepitus.   Diminish no wheezing with no rhonchi  Abdominal: Soft. Normal appearance and bowel sounds are normal. He exhibits no distension. There is no tenderness. There is no  rebound and no guarding.  Musculoskeletal: Normal range of motion. He exhibits edema. He exhibits no tenderness.  Diffuse swelling bilateral lower legs with mild pitting    Neurological: He is alert and oriented to person, place, and time. He has normal strength. No cranial nerve deficit.  Skin: Skin is warm, dry and intact. No rash noted. No erythema. There is pallor.  Well healing midline sternotomy scar without infection    Psychiatric: His speech is normal and behavior is normal. His mood appears not anxious.  Slow mentation and flat affect    ED Course   Medications  furosemide (LASIX) injection 60 mg (60 mg Intravenous Given 06/20/13 1023)  nitroGLYCERIN (NITROGLYN) 2 % ointment 1 inch (1 inch Topical Given 06/20/13 1023)   DIAGNOSTIC STUDIES: Oxygen Saturation is 93% on [place], low  by my interpretation.    COORDINATION OF CARE: 9:58 AM Discussed course of care with pt's family which include chest x-ray and IV fluids . Pt understands and agrees.  11:17 AM Discussed his low sodium levels, evidence of CHF and plans to consult with cardiologist for admission.  Patient has hyponatremia most likely from excess total body water since his presentation is most consistent with congestive heart failure. He would probably benefit from a transfusion however it was  felt he would be transferred soon and  transfusion was not started here. In addition he will need close monitoring when transfused because of his underlying Congestive heart failure that has flared up at this time.  11:25 Dr Kym Groom accepts in transfer to [facility] Telemetry for Dr Anne Fu.   Pt has had 600 cc or urine output after the IV lasix.   14:00 [facility] here, they feel pt needs to go to higher level of care, Step down unavailable states Dr Mayford Knife requests ICU  PT had one episode of hypotension into 83 systolic while sleeping, when awakened his BP improved to 90 systolic. Pt is noted to be breathing deeply but is in no distress. He  has been able to eat and is resting quietly in his room.    Procedures (including critical care time)   Results for orders placed during the hospital encounter of 06/20/13  CBC WITH DIFFERENTIAL      Result Value Range   WBC 17.1 (*) 4.0 - 10.5 K/uL   RBC 2.67 (*) 4.22 - 5.81 MIL/uL   Hemoglobin 8.8 (*) 13.0 - 17.0 g/dL   HCT 16.1 (*) 09.6 - 04.5 %   MCV 89.1  78.0 - 100.0 fL   MCH 33.0  26.0 - 34.0 pg   MCHC 37.0 (*) 30.0 - 36.0 g/dL   RDW 40.9  81.1 - 91.4 %   Platelets 245  150 - 400 K/uL   Neutrophils Relative % 90 (*) 43 - 77 %   Neutro Abs 15.3 (*) 1.7 - 7.7 K/uL   Lymphocytes Relative 3 (*) 12 - 46 %   Lymphs Abs 0.6 (*) 0.7 - 4.0 K/uL   Monocytes Relative 6  3 - 12 %   Monocytes Absolute 1.1 (*) 0.1 - 1.0 K/uL   Eosinophils Relative 1  0 - 5 %   Eosinophils Absolute 0.1  0.0 - 0.7 K/uL   Basophils Relative 0  0 - 1 %   Basophils Absolute 0.0  0.0 - 0.1 K/uL  COMPREHENSIVE METABOLIC PANEL      Result Value Range   Sodium 116 (*) 135 - 145 mEq/L   Potassium 3.8  3.5 - 5.1 mEq/L   Chloride 83 (*) 96 - 112 mEq/L   CO2 24  19 - 32 mEq/L   Glucose, Bld 102 (*) 70 - 99 mg/dL   BUN 14  6 - 23 mg/dL   Creatinine, Ser 7.82  0.50 - 1.35 mg/dL   Calcium 7.8 (*) 8.4 - 10.5 mg/dL   Total Protein 5.8 (*) 6.0 - 8.3 g/dL   Albumin 2.3 (*) 3.5 - 5.2 g/dL   AST 18  0 - 37 U/L   ALT 15  0 - 53 U/L   Alkaline Phosphatase 90  39 - 117 U/L   Total Bilirubin 0.9  0.3 - 1.2 mg/dL   GFR calc non Af Amer 72 (*) >90 mL/min   GFR calc Af Amer 83 (*) >90 mL/min  TROPONIN I      Result Value Range   Troponin I <0.30  <0.30 ng/mL  PRO B NATRIURETIC PEPTIDE      Result Value Range   Pro B Natriuretic peptide (BNP) 2996.0 (*) 0 - 450 pg/mL   . Laboratory interpretation all normal except stable anemia, elevated BNP, leukocytosis   Dg Chest Portable 1 View  06/20/2013   *RADIOLOGY REPORT*  Clinical Data: Shortness of breath.  Recent CABG.  PORTABLE CHEST - 1 VIEW  Comparison:  06/10/2013   Findings: Changes of CABG and AVR.  Stable cardiomegaly.  Diffuse interstitial and alveolar edema or infiltrates have developed bilaterally.  There is some patchy airspace consolidation in the lung bases, left greater than right.  Suspect small pleural effusions.  IMPRESSION:  1.  New bilateral edema/infiltrates with small effusions. 2.  Stable cardiomegaly and postop changes.   Original Report Authenticated By: D. Andria Rhein, MD    Date: 06/20/2013  Rate: 74  Rhythm: normal sinus rhythm  QRS Axis: right  Intervals: PR prolonged  ST/T Wave abnormalities: nonspecific ST/T changes  Conduction Disutrbances:nonspecific intraventricular conduction delay  Narrative Interpretation:   Old EKG Reviewed: unchanged from 06/05/2013    1. Hyponatremia   2. SOB (shortness of breath)   3. CHF (congestive heart failure)   4. Anemia    Plan admission   Devoria Albe, MD, FACEP   CRITICAL CARE Performed by: Devoria Albe L Total critical care time: 34 min Critical care time was exclusive of separately billable procedures and treating other patients. Critical care was necessary to treat or prevent imminent or life-threatening deterioration. Critical care was time spent personally by me on the following activities: development of treatment plan with patient and/or surrogate as well as nursing, discussions with consultants, evaluation of patient's response to treatment, examination of patient, obtaining history from patient or surrogate, ordering and performing treatments and interventions, ordering and review of laboratory studies, ordering and review of radiographic studies, pulse oximetry and re-evaluation of patient's condition.   MDM   I personally performed the services described in this documentation, which was scribed in my presence. The recorded information has been reviewed and considered.  Devoria Albe, MD, FACEP     Ward Givens, MD 06/20/13 [phone]

## 2013-06-20 NOTE — ED Notes (Signed)
Armed forces logistics/support/administrative officer at [employer] notified of pt's admission status.

## 2013-06-20 NOTE — ED Notes (Signed)
Pt sent to er from [facility] with c/o sob, increased weight gain of a 1lb since yesterday, oxygen sats 86% on 2LPM via [place], increased at [facility] to 5lpm with sats increase to 94%. Pt was given lasix 40mg  for increased swelling in lower extremities. Has performed a bnp with result of 2342 and sodium of 126. Pt reports that he has been sob for the past few days, dry cough, has noticed that the swelling to his lower legs has become worse. Pt alert, able to answer questions, has healing incision to mid chest area with two smaller separate incisions with steri strips in place left and right upper abd area.  No redness or drainage noted, pt first degree block on monitor with occasional pvc.

## 2013-06-20 NOTE — ED Notes (Signed)
[facility] arrived to transport pt advised that pt would need to be moved to step down or icu bed, spoke with attending who agreed. Pt and family updated on plan of care, delay explained to pt, new bed request placed,

## 2013-06-20 NOTE — ED Notes (Signed)
Dr Knapp at bedside speaking with pt and family 

## 2013-06-20 NOTE — ED Notes (Signed)
Report given to [employer] on [phone]

## 2013-06-20 NOTE — Progress Notes (Signed)
ANTIBIOTIC CONSULT NOTE - INITIAL  Pharmacy Consult for Vancomycin Indication: pneumonia  No Known Allergies  Patient Measurements: Height: 5\' 8"  (172.7 cm) Weight: 169 lb 5 oz (76.8 kg) IBW/kg (Calculated) : 68.4   Vital Signs: Temp: 97.8 F (36.6 C) (08/24 2000) Temp src: Oral (08/24 2000) BP: 137/40 mmHg (08/24 2000) Pulse Rate: 70 (08/24 2000) Intake/Output from previous day:   Intake/Output from this shift: Total I/O In: 150 [IV Piggyback:150] Out: 500 [Urine:500]  Labs:  Recent Labs  06/20/13 0944 06/20/13 2000  WBC 17.1* 16.1*  HGB 8.8* 9.4*  PLT 245 279  CREATININE 0.98  --    Estimated Creatinine Clearance: 51.4 ml/min (by C-G formula based on Cr of 0.98). No results found for this basename: VANCOTROUGH, Leodis Binet, VANCORANDOM, GENTTROUGH, GENTPEAK, GENTRANDOM, TOBRATROUGH, TOBRAPEAK, TOBRARND, AMIKACINPEAK, AMIKACINTROU, AMIKACIN,  in the last 72 hours   Microbiology: Recent Results (from the past 720 hour(s))  MRSA PCR SCREENING     Status: None   Collection Time    05/31/13  8:51 PM      Result Value Range Status   MRSA by PCR NEGATIVE  NEGATIVE Final   Comment:            The GeneXpert MRSA Assay (FDA     approved for NASAL specimens     only), is one component of a     comprehensive MRSA colonization     surveillance program. It is not     intended to diagnose MRSA     infection nor to guide or     monitor treatment for     MRSA infections.  SURGICAL PCR SCREEN     Status: Abnormal   Collection Time    06/03/13  9:35 PM      Result Value Range Status   MRSA, PCR NEGATIVE  NEGATIVE Final   Staphylococcus aureus POSITIVE (*) NEGATIVE Final   Comment:            The Xpert SA Assay (FDA     approved for NASAL specimens     in patients over 50 years of age),     is one component of     a comprehensive surveillance     program.  Test performance has     been validated by [employer] for patients greater     than or equal to 1 year  old.     It is not intended     to diagnose infection nor to     guide or monitor treatment.     RESULT CALLED TO, READ BACK BY AND VERIFIED WITH:      Lewie Chamber (RN) AT 7829 06/04/2013 L.LOMAX    Medical History: Past Medical History  Diagnosis Date  . BPH (benign prostatic hypertrophy)   . Diverticulosis 2006  . Depression   . Anemia   . Spinal stenosis   . Lung nodules 2010  . Actinic keratosis, hx of   . Macular degeneration   . Hypertension   . Allergy     allergic rhinitis  . Hearing loss     wears bilateral hearing aids  . Inguinal hernia     left side -surgery planned  . GERD (gastroesophageal reflux disease)   . Arthritis     generalized-more back issues  . Severe aortic stenosis 04/16/2013  . Nodule of right lung 05/24/2013  . CHF (congestive heart failure)   . Shortness of breath   . Atrial  flutter     Medications:  Prescriptions prior to admission  Medication Sig Dispense Refill  . amiodarone (PACERONE) 200 MG tablet Take 1 tablet (200 mg total) by mouth 2 (two) times daily.      Marland Kitchen aspirin EC 325 MG EC tablet Take 1 tablet (325 mg total) by mouth daily.  30 tablet  0  . atorvastatin (LIPITOR) 20 MG tablet Take 1 tablet (20 mg total) by mouth daily at 6 PM.      . doxazosin (CARDURA) 4 MG tablet Take 4 mg by mouth at bedtime.      . ferrous fumarate (HEMOCYTE - 106 MG FE) 325 (106 FE) MG TABS tablet Take 1 tablet (106 mg of iron total) by mouth 2 (two) times daily.  30 each  0  . folic acid (FOLVITE) 1 MG tablet Take 1 tablet (1 mg total) by mouth daily.      . furosemide (LASIX) 40 MG tablet Take 1 tablet (40 mg total) by mouth daily.  30 tablet    . metoprolol tartrate (LOPRESSOR) 25 MG tablet Take 12.5 mg by mouth 2 (two) times daily.      . Multiple Vitamin (MULTIVITAMIN WITH MINERALS) TABS Take 1 tablet by mouth daily.      . Multiple Vitamins-Minerals (PRESERVISION AREDS PO) Take 2 capsules by mouth daily.       Marland Kitchen omeprazole (PRILOSEC OTC) 20 MG tablet  Take 20 mg by mouth daily.      . potassium chloride SA (K-DUR,KLOR-CON) 20 MEQ tablet Take 1 tablet (20 mEq total) by mouth daily.      . solifenacin (VESICARE) 5 MG tablet Take 5 mg by mouth daily.      . fluticasone (FLONASE) 50 MCG/ACT nasal spray Place 2 sprays into the nose daily as needed for allergies.       Marland Kitchen loratadine (CLARITIN) 10 MG tablet Take 10 mg by mouth daily as needed for allergies.       Marland Kitchen traMADol (ULTRAM) 50 MG tablet Take 1 tablet (50 mg total) by mouth every 6 (six) hours as needed.  120 tablet  5   Assessment: 79 yo M admitted 06/20/2013  With pneumonia.  Pharmacy consulted to dose vancomcyin.  ID: pneumonia; WBC 16.1, hypothermic Vanc 8/24> Cefepime 8/24> 8/24>>blood  Goal of Therapy:  Vancomycin trough level 15-20 mcg/ml  Plan:  Vancomycin 750 mg IV q12h Follow up SCr, UOP, cultures, clinical course and adjust as clinically indicated.    Thank you for allowing pharmacy to be a part of this patients care team.  Lovenia Kim Pharm.D., BCPS Clinical Pharmacist 06/20/2013 9:12 PM Pager: [phone] Phone: [phone]

## 2013-06-20 NOTE — ED Notes (Signed)
MD at bedside. 

## 2013-06-20 NOTE — ED Notes (Signed)
Dr. Lynelle Doctor aware of vital signs, no additional orders given

## 2013-06-21 ENCOUNTER — Inpatient Hospital Stay (HOSPITAL_COMMUNITY): Payer: Medicare Other

## 2013-06-21 ENCOUNTER — Encounter (HOSPITAL_COMMUNITY): Payer: Self-pay | Admitting: Radiology

## 2013-06-21 LAB — BASIC METABOLIC PANEL
BUN: 15 mg/dL (ref 6–23)
CO2: 23 mEq/L (ref 19–32)
Calcium: 7.7 mg/dL — ABNORMAL LOW (ref 8.4–10.5)
Chloride: 83 mEq/L — ABNORMAL LOW (ref 96–112)
Creatinine, Ser: 1.01 mg/dL (ref 0.50–1.35)
Glucose, Bld: 93 mg/dL (ref 70–99)

## 2013-06-21 LAB — LEGIONELLA ANTIGEN, URINE: Legionella Antigen, Urine: NEGATIVE

## 2013-06-21 LAB — TROPONIN I: Troponin I: <0.3 ng/mL (ref <0.30)

## 2013-06-21 LAB — OSMOLALITY: Osmolality: 248 mOsm/kg — ABNORMAL LOW (ref 275–300)

## 2013-06-21 MED ORDER — IOHEXOL 350 MG/ML SOLN
100.0000 mL | Freq: Once | INTRAVENOUS | Status: AC | PRN
  Administered 2013-06-21: 100 mL via INTRAVENOUS

## 2013-06-21 MED ORDER — POTASSIUM CHLORIDE CRYS ER 20 MEQ PO TBCR
40.0000 meq | EXTENDED_RELEASE_TABLET | Freq: Once | ORAL | Status: AC
  Administered 2013-06-21: 40 meq via ORAL
  Filled 2013-06-21: qty 2

## 2013-06-21 NOTE — Progress Notes (Addendum)
Subjective:  +SOB, on FM o2. No CP. +edema LE, weak.   Objective:  Vital Signs in the last 24 hours: Temp:  [94.9 F (34.9 C)-99 F (37.2 C)] 98.6 F (37 C) (08/25 0755) Pulse Rate:  [56-75] 73 (08/25 0700) Resp:  [14-26] 20 (08/25 0700) BP: (83-137)/(33-67) 129/40 mmHg (08/25 0700) SpO2:  [86 %-96 %] 90 % (08/25 0700) FiO2 (%):  [50 %] 50 % (08/25 0400) Weight:  [73.4 kg (161 lb 13.1 oz)-76.8 kg (169 lb 5 oz)] 73.4 kg (161 lb 13.1 oz) (08/25 0500)  Intake/Output from previous day: 08/24 0701 - 08/25 0700 In: 310 [P.O.:100; I.V.:10; IV Piggyback:200] Out: 3200 [Urine:3200]   Physical Exam: General: Elderly, in no acute distress. Head:  Normocephalic and atraumatic. Lungs: Mild crackles at bases. Mildly increased resp effort Heart: Normal S1 and S2.  Soft S murmur RUSB, no rubs or gallops.  Abdomen: soft, non-tender, positive bowel sounds. Extremities: No clubbing or cyanosis. 1+ BLE edema. Neurologic: Alert and oriented x 3.    Lab Results:  Recent Labs  06/20/13 0944 06/20/13 2000  WBC 17.1* 16.1*  HGB 8.8* 9.4*  PLT 245 279    Recent Labs  06/20/13 2000 06/21/13 0500  NA 118* 118*  K 3.3* 3.2*  CL 82* 83*  CO2 23 23  GLUCOSE 98 93  BUN 15 15  CREATININE 1.04 1.01    Recent Labs  06/21/13 06/21/13 0435  TROPONINI <0.30 <0.30   Hepatic Function Panel  Recent Labs  06/20/13 2000  PROT 6.1  ALBUMIN 2.4*  AST 19  ALT 16  ALKPHOS 94  BILITOT 0.8   No results found for this basename: CHOL,  in the last 72 hours No results found for this basename: PROTIME,  in the last 72 hours  Imaging: Dg Chest Portable 1 View  06/20/2013   *RADIOLOGY REPORT*  Clinical Data: Shortness of breath.  Recent CABG.  PORTABLE CHEST - 1 VIEW  Comparison: 06/10/2013  Findings: Changes of CABG and AVR.  Stable cardiomegaly.  Diffuse interstitial and alveolar edema or infiltrates have developed bilaterally.  There is some patchy airspace consolidation in the lung bases,  left greater than right.  Suspect small pleural effusions.  IMPRESSION:  1.  New bilateral edema/infiltrates with small effusions. 2.  Stable cardiomegaly and postop changes.   Original Report Authenticated By: D. Andria Rhein, MD   Personally viewed.   Telemetry: SR, 1st degree AVB Personally viewed.   Assessment/Plan:  Principal Problem:   Acute and chronic respiratory failure Active Problems:   Orthostatic hypotension   HCAP (healthcare-associated pneumonia)   Hyponatremia  1) HCAP vs. Diastolic heart failure  - Lasix IV  - Treating with antibiotics. Appreciate Dr. Lendell Caprice consult  - Tommi Rumps sating. On FM o2  2) PAF  - currently NSR  - AMIO  3) Hyponatremia  - appreciate TRH recs as well.   - Fluid rest 1.5L  - Lasix   - ?degree of SIADH as well.  - TSH OK  4) Acute RF  - as above  Appreciate team effort.  PT     Suly Vukelich 06/21/2013, 9:14 AM    ECHO EF normal Left pleural effusion noted - ? Contributing to symptoms. Continue to diurese.  Dr. Tyrone Sage on holiday this week. If TCTS needed will call.

## 2013-06-21 NOTE — Progress Notes (Signed)
CRITICAL VALUE ALERT  Critical value received:  Na 118  Date of notification:  06/21/13  Time of notification:  0618  Critical value read back:yes  Nurse who received alert:  Neville Route, RN  MD aware of low Na, unchanged from last BMET

## 2013-06-21 NOTE — Progress Notes (Signed)
  Echocardiogram 2D Echocardiogram has been performed.  Dorothey Baseman 06/21/2013, 12:39 PM

## 2013-06-21 NOTE — Care Management Note (Signed)
    Page 1 of 1   06/21/2013     12:18:04 PM   CARE MANAGEMENT NOTE 06/21/2013  Patient:  Joseph Garner, Joseph Garner   Account Number:  [id number]  Date Initiated:  06/21/2013  Documentation initiated by:  Junius Creamer  Subjective/Objective Assessment:   adm w resp failure     Action/Plan:   lives w wife, pcp dr Burney Gauze, had gone to snf after last hosp   Anticipated DC Date:     Anticipated DC Plan:    In-house referral  Clinical Social Worker      DC Associate Professor  CM consult      Choice offered to / List presented to:             Status of service:   Medicare Important Message given?   (If response is "NO", the following Medicare IM given date fields will be blank) Date Medicare IM given:   Date Additional Medicare IM given:    Discharge Disposition:    Per UR Regulation:  Reviewed for med. necessity/level of care/duration of stay  If discussed at Long Length of Stay Meetings, dates discussed:    Comments:

## 2013-06-21 NOTE — Progress Notes (Signed)
CRITICAL VALUE ALERT  Critical value received:  Osmolality 248  Date of notification:  06/21/13  Time of notification:  0052  Critical value read back:yes  Nurse who received alert:  Neville Route, RN  MD notified (1st page):  Dr. Mayford Knife  Time of first page:  0052  Responding MD:  Dr. Mayford Knife  Time MD responded:  [phone]

## 2013-06-21 NOTE — Progress Notes (Signed)
[address]       [name] [address]             [phone]     CARDIOTHORACIC SURGERY PROGRESS NOTE   Subjective: Very dyspneic without O2.  No pain.  Objective: Vital signs in last 24 hours: Temp:  [97.8 F (36.6 C)-99 F (37.2 C)] 98.2 F (36.8 C) (08/25 1600) Pulse Rate:  [56-76] 70 (08/25 1700) Cardiac Rhythm:  [-] Normal sinus rhythm;Heart block (08/25 1600) Resp:  [16-26] 23 (08/25 1700) BP: (91-138)/(29-56) 130/56 mmHg (08/25 1700) SpO2:  [86 %-99 %] 92 % (08/25 1700) FiO2 (%):  [50 %] 50 % (08/25 1600) Weight:  [73.4 kg (161 lb 13.1 oz)-76.8 kg (169 lb 5 oz)] 73.4 kg (161 lb 13.1 oz) (08/25 0500)  Physical Exam:  Rhythm:   sinus  Breath sounds: Bibasilar crackles L>R, no wheezes  Heart sounds:  RRR  Incisions:  Clean and dry, healing nicely  Abdomen:  soft  Extremities:  warm   Intake/Output from previous day: 08/24 0701 - 08/25 0700 In: 310 [P.O.:100; I.V.:10; IV Piggyback:200] Out: 3200 [Urine:3200] Intake/Output this shift: Total I/O In: 563 [P.O.:360; I.V.:3; IV Piggyback:200] Out: 450 [Urine:450]  Lab Results:  Recent Labs  06/20/13 0944 06/20/13 2000  WBC 17.1* 16.1*  HGB 8.8* 9.4*  HCT 23.8* 25.4*  PLT 245 279   BMET:  Recent Labs  06/20/13 2000 06/21/13 0500  NA 118* 118*  K 3.3* 3.2*  CL 82* 83*  CO2 23 23  GLUCOSE 98 93  BUN 15 15  CREATININE 1.04 1.01  CALCIUM 7.9* 7.7*    CBG (last 3)  No results found for this basename: GLUCAP,  in the last 72 hours PT/INR:   Recent Labs  06/20/13 2000  LABPROT 16.1*  INR 1.32   Results for JAVIUS, SYLLA (MRN [id number]) as of 06/21/2013 18:08  Ref. Range 06/03/2013 05:48 06/20/2013 09:44  Pro B Natriuretic peptide (BNP) Latest Range: 0-450 pg/mL 2342.0 (H) 2996.0 (H)     CXR:  *RADIOLOGY REPORT*  Clinical Data: Shortness of breath. Recent CABG.  PORTABLE CHEST - 1 VIEW  Comparison: 06/10/2013  Findings: Changes of CABG and AVR. Stable cardiomegaly.  Diffuse  interstitial and alveolar edema or infiltrates have developed  bilaterally. There is some patchy airspace consolidation in the  lung bases, left greater than right. Suspect small pleural  effusions.  IMPRESSION:  1. New bilateral edema/infiltrates with small effusions.  2. Stable cardiomegaly and postop changes.  Original Report Authenticated By: D. Andria Rhein, MD    Transthoracic Echocardiography  Patient: Vi, Whitesel MR #: [id number] Study Date: 06/21/2013 Gender: M Age: 8 Height: 172.7cm Weight: 76.2kg BSA: 1.79m^2 Pt. Status: Room: 2H14C  Dois Davenport, MD PERFORMING [facility] SONOGRAPHER Jimmy Reel cc:  ------------------------------------------------------------ LV EF: 65% - 70%  ------------------------------------------------------------ Indications: Acute respiratory distress 518.82.  ------------------------------------------------------------ Study Conclusions  - Left ventricle: The cavity size was normal. There was mild concentric hypertrophy. Systolic function was vigorous. The estimated ejection fraction was in the range of 65% to 70%. Wall motion was normal; there were no regional wall motion abnormalities. Features are consistent with a pseudonormal left ventricular filling pattern, with concomitant abnormal relaxation and increased filling pressure (grade 2 diastolic dysfunction). - Aortic valve: A prosthesis was present and functioning normally. The prosthesis had a normal range of motion. The sewing ring appeared normal, had no rocking motion, and showed no evidence of dehiscence. Peak velocity:  258cm/s (S). Valve area: 1.86cm^2(VTI). Valve area: 1.57cm^2 (Vmax). - Pulmonary arteries: Systolic pressure was mildly increased. PA peak pressure: 39mm Hg (S). - Pericardium, extracardiac: There was a left pleural effusion. Transthoracic echocardiography. M-mode, complete 2D, spectral Doppler, and color Doppler.  Height: Height: 172.7cm. Height: 68in. Weight: Weight: 76.2kg. Weight: 167.7lb. Body mass index: BMI: 25.5kg/m^2. Body surface area: BSA: 1.63m^2. Blood pressure: 138/41. Patient status: Inpatient. Location: Bedside.  ------------------------------------------------------------  ------------------------------------------------------------ Left ventricle: The cavity size was normal. There was mild concentric hypertrophy. Systolic function was vigorous. The estimated ejection fraction was in the range of 65% to 70%. Wall motion was normal; there were no regional wall motion abnormalities. Features are consistent with a pseudonormal left ventricular filling pattern, with concomitant abnormal relaxation and increased filling pressure (grade 2 diastolic dysfunction).  ------------------------------------------------------------ Aortic valve: A prosthesis was present and functioning normally. The prosthesis had a normal range of motion. The sewing ring appeared normal, had no rocking motion, and showed no evidence of dehiscence. Cusp separation was normal. Doppler: VTI ratio of LVOT to aortic valve: 0.59. Valve area: 1.86cm^2(VTI). Indexed valve area: 0.98cm^2/m^2 (VTI). Peak velocity ratio of LVOT to aortic valve: 0.5. Valve area: 1.57cm^2 (Vmax). Indexed valve area: 0.83cm^2/m^2 (Vmax). Mean gradient: 11mm Hg (S). Peak gradient: 27mm Hg (S).  ------------------------------------------------------------ Aorta: The aorta was normal, not dilated, and non-diseased.  ------------------------------------------------------------ Mitral valve: Structurally normal valve. Leaflet separation was normal. Doppler: Transvalvular velocity was within the normal range. There was no evidence for stenosis. Trivial regurgitation. Peak gradient: 5mm Hg (D).  ------------------------------------------------------------ Left atrium: The atrium was normal in  size.  ------------------------------------------------------------ Right ventricle: The cavity size was normal. Wall thickness was normal. Systolic function was normal.  ------------------------------------------------------------ Pulmonic valve: Structurally normal valve. Cusp separation was normal. Doppler: Transvalvular velocity was within the normal range. Trivial regurgitation.  ------------------------------------------------------------ Tricuspid valve: Structurally normal valve. Leaflet separation was normal. Doppler: Transvalvular velocity was within the normal range. No regurgitation.  ------------------------------------------------------------ Pulmonary artery: The main pulmonary artery was normal-sized. Systolic pressure was mildly increased.  ------------------------------------------------------------ Right atrium: The atrium was normal in size.  ------------------------------------------------------------ Pericardium: There was no pericardial effusion.  ------------------------------------------------------------ Systemic veins: Inferior vena cava: The vessel was dilated; the respirophasic diameter changes were in the normal range (= 50%); findings are consistent with normal/mildly elevatedcentral venous pressure.  ------------------------------------------------------------ Pleura: There was a left pleural effusion.  ------------------------------------------------------------ Post procedure conclusions Ascending Aorta:  - The aorta was normal, not dilated, and non-diseased.  ------------------------------------------------------------  2D measurements Normal Doppler measurements Normal Left ventricle Main pulmonary LVID ED, 43.8 mm 43-52 artery chord, Pressure, 39 mm Hg =30 PLAX S LVID ES, 30.1 mm 23-38 Left ventricle chord, Ea, lat 8.89 cm/s ------ PLAX ann, tiss FS, chord, 31 % >29 DP PLAX E/Ea, lat 12.2 ------ LVPW, ED 12 mm ------ ann, tiss  6 IVS/LVPW 1.13 <1.3 DP ratio, ED Ea, med 4.83 cm/s ------ Ventricular septum ann, tiss IVS, ED 13.6 mm ------ DP LVOT E/Ea, med 22.5 ------ Diam, S 20 mm ------ ann, tiss 7 Area 3.14 cm^2 ------ DP Aorta LVOT Root diam, 31 mm ------ Peak vel, 129 cm/s ------ ED S Left atrium VTI, S 21.5 cm ------ AP dim 30 mm ------ Peak 7 mm Hg ------ AP dim 1.58 cm/m^2 <2.2 gradient, index S Aortic valve Peak vel, 258 cm/s ------ S Mean vel, 152 cm/s ------ S VTI, S 36.3 cm ------ Mean 11 mm Hg ------ gradient, S Peak 27 mm Hg ------ gradient, S VTI ratio 0.59 ------ LVOT/AV  Area, VTI 1.86 cm^2 ------ Area index 0.98 cm^2/m ------ (VTI) ^2 Peak vel 0.5 ------ ratio, LVOT/AV Area, Vmax 1.57 cm^2 ------ Area index 0.83 cm^2/m ------ (Vmax) ^2 Mitral valve Peak E vel 109 cm/s ------ Peak A vel 115 cm/s ------ Decelerati 222 ms 150-23 on time 0 Peak 5 mm Hg ------ gradient, D Peak E/A 0.9 ------ ratio Tricuspid valve Regurg 247 cm/s ------ peak vel Peak RV-RA 24 mm Hg ------ gradient, S Systemic veins Estimated 15 mm Hg ------ CVP Right ventricle Pressure, 39 mm Hg <30 S Ea vel, 9.98 cm/s ------ lat ann, tiss DP  ------------------------------------------------------------ Prepared and Electronically Authenticated by  Donato Schultz 2014-08-25T14:22:[id number]         Assessment/Plan:   Mr Markoff is well-known to me and our service having 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  His postoperative recovery has been somewhat slow but overall uncomplicated.  He was just discharged to the [facility] 6 days ago.  According to his family he has remained weak and unable to get out of bed on his own, and at the [facility] he apparently only was gotten out of bed once last Wednesday and again on Thursday by PT and OT.  His appetite has remained poor.  He developed progressive hypoxemia and SOB.  By yesterday he had  developed further hypoxemia with O2 sats 89% on 4 L/min.  He was taken to ED at [facility] and subsequently transferred back to [facility].  He denies any pain.  He does have cough and mild leukocytosis.  CXR reveals bibasilar atelectasis with diffuse mild pulmonary opacities c/w edema +/- pneumonia.  He may have small pleural effusions L>R.  Follow up ECHO looks good with normal functioning aortic valve prosthesis and good LV function without any pericardial effusion.  I agree with plans for IV antibiotics for possible HCAP and IV diuretic therapy for acute on chronic diastolic CHF.  I also think it would be wise to get CTA of chest to r/o PE and further evaluate size of effusions.  Will follow.  OWEN,CLARENCE H 06/21/2013 6:07 PM

## 2013-06-21 NOTE — Progress Notes (Signed)
PT Cancellation Note  Patient Details Name: Joseph Garner MRN: [id number] DOB: Jun 17, 1924   Cancelled Treatment:    Reason Eval/Treat Not Completed: Medical issues which prohibited therapy, pt desating with bed mobility and bathing with nsg, just put on venti mask. Nursing request that we hold PT until tomorrow morning, will check back at that time.    Dejia Ebron, [place] 06/21/2013, 12:43 PM

## 2013-06-22 ENCOUNTER — Inpatient Hospital Stay (HOSPITAL_COMMUNITY): Payer: Medicare Other

## 2013-06-22 DIAGNOSIS — I25810 Atherosclerosis of coronary artery bypass graft(s) without angina pectoris: Secondary | ICD-10-CM | POA: Diagnosis present

## 2013-06-22 DIAGNOSIS — J90 Pleural effusion, not elsewhere classified: Secondary | ICD-10-CM | POA: Diagnosis present

## 2013-06-22 LAB — BASIC METABOLIC PANEL
BUN: 15 mg/dL (ref 6–23)
CO2: 26 mEq/L (ref 19–32)
Chloride: 84 mEq/L — ABNORMAL LOW (ref 96–112)
Creatinine, Ser: 1.08 mg/dL (ref 0.50–1.35)
GFR calc Af Amer: 69 mL/min — ABNORMAL LOW (ref >90)
Potassium: 3.3 mEq/L — ABNORMAL LOW (ref 3.5–5.1)

## 2013-06-22 LAB — PREALBUMIN: Prealbumin: 5.3 mg/dL — ABNORMAL LOW (ref 17.0–34.0)

## 2013-06-22 MED ORDER — POTASSIUM CHLORIDE CRYS ER 20 MEQ PO TBCR
40.0000 meq | EXTENDED_RELEASE_TABLET | Freq: Once | ORAL | Status: AC
  Administered 2013-06-22: 40 meq via ORAL

## 2013-06-22 MED ORDER — AMIODARONE HCL 200 MG PO TABS
200.0000 mg | ORAL_TABLET | Freq: Every day | ORAL | Status: DC
  Administered 2013-06-22: 200 mg via ORAL
  Filled 2013-06-22 (×2): qty 1

## 2013-06-22 MED ORDER — POTASSIUM CHLORIDE CRYS ER 20 MEQ PO TBCR
40.0000 meq | EXTENDED_RELEASE_TABLET | Freq: Two times a day (BID) | ORAL | Status: AC
  Administered 2013-06-22 (×2): 40 meq via ORAL
  Filled 2013-06-22 (×3): qty 2

## 2013-06-22 MED ORDER — FUROSEMIDE 10 MG/ML IJ SOLN
80.0000 mg | Freq: Two times a day (BID) | INTRAMUSCULAR | Status: DC
  Administered 2013-06-22 – 2013-06-26 (×7): 80 mg via INTRAVENOUS
  Filled 2013-06-22 (×6): qty 8
  Filled 2013-06-22: qty 4
  Filled 2013-06-22 (×3): qty 8

## 2013-06-22 NOTE — Progress Notes (Signed)
PT Cancellation Note  Patient Details Name: Joseph Garner MRN: [id number] DOB: 1924/01/14   Cancelled Treatment:    Reason Eval/Treat Not Completed: Medical issues which prohibited therapy. RN deferred this date due to patient now on NRB mask with difficulty breathing and maintaining good O2 sats. PT to return as able.   Marcene Brawn 06/22/2013, 8:42 AM  Yanes Shock, PT, DPT Pager #: [phone] Office #: [phone]

## 2013-06-22 NOTE — Progress Notes (Addendum)
TCTS DAILY ICU PROGRESS NOTE                   [address]            [place] [address]          [phone]        Total Length of Stay:  LOS: 2 days   Subjective: Feels a little better , remains SOB with minimal exertion  Objective: Vital signs in last 24 hours: Temp:  [98 F (36.7 C)-98.6 F (37 C)] 98.1 F (36.7 C) (08/26 0730) Pulse Rate:  [61-79] 72 (08/26 0730) Cardiac Rhythm:  [-] Normal sinus rhythm;Heart block (08/26 0400) Resp:  [17-31] 18 (08/26 0730) BP: (95-146)/(29-56) 118/38 mmHg (08/26 0700) SpO2:  [85 %-99 %] 96 % (08/26 0730) FiO2 (%):  [20 %-100 %] 100 % (08/26 0400) Weight:  [161 lb 13.1 oz (73.4 kg)] 161 lb 13.1 oz (73.4 kg) (08/26 0500)  Filed Weights   06/20/13 1810 06/21/13 0500 06/22/13 0500  Weight: 169 lb 5 oz (76.8 kg) 161 lb 13.1 oz (73.4 kg) 161 lb 13.1 oz (73.4 kg)    Weight change: -7 lb 2.9 oz (-3.258 kg)   Hemodynamic parameters for last 24 hours:    Intake/Output from previous day: 08/25 0701 - 08/26 0700 In: 883 [P.O.:460; I.V.:23; IV Piggyback:400] Out: 1190 [Urine:1190]  Intake/Output this shift:    Current Meds: Scheduled Meds: . amiodarone  200 mg Oral BID  . aspirin EC  325 mg Oral Daily  . atorvastatin  20 mg Oral q1800  . ceFEPime (MAXIPIME) IV  1 g Intravenous Q12H  . darifenacin  7.5 mg Oral Daily  . doxazosin  4 mg Oral QHS  . feeding supplement  1 Container Oral TID WC  . ferrous fumarate  1 tablet Oral BID  . folic acid  1 mg Oral Daily  . furosemide  40 mg Intravenous Q12H  . heparin  5,000 Units Subcutaneous Q8H  . metoprolol tartrate  12.5 mg Oral BID  . pantoprazole  40 mg Oral Daily  . potassium chloride  40 mEq Oral BID  . sodium chloride  3 mL Intravenous Q12H  . vancomycin  750 mg Intravenous Q12H   Continuous Infusions:  PRN Meds:.sodium chloride, acetaminophen, ondansetron (ZOFRAN) IV, sodium chloride, traMADol  General appearance: alert, cooperative, fatigued and mild  distress Heart: regular rate and rhythm and 2/6 systolic murmur Lungs: no wheeze or crackles, dim in bases Abdomen: + dist, soft, nontender, + BS Extremities: no sig edema Wound: incis healing well  Lab Results: CBC: Recent Labs  06/20/13 0944 06/20/13 2000  WBC 17.1* 16.1*  HGB 8.8* 9.4*  HCT 23.8* 25.4*  PLT 245 279   BMET:  Recent Labs  06/21/13 0500 06/22/13 0335  NA 118* 121*  K 3.2* 3.3*  CL 83* 84*  CO2 23 26  GLUCOSE 93 105*  BUN 15 15  CREATININE 1.01 1.08  CALCIUM 7.7* 7.9*    PT/INR:  Recent Labs  06/20/13 2000  LABPROT 16.1*  INR 1.32      Radiology: Ct Angio Chest Pe W/cm &/or Wo Cm  06/21/2013   *RADIOLOGY REPORT*  Clinical Data: Shortness of breath, recent CABG and wedge resection  CT ANGIOGRAPHY CHEST  Technique:  Multidetector CT imaging of the chest using the standard protocol during bolus administration of intravenous contrast. Multiplanar reconstructed images including MIPs were obtained and reviewed to evaluate the vascular anatomy.  Contrast: OMNIPAQUE  IOHEXOL 350 MG/ML SOLN  Comparison: Chest radiograph 06/20/2013, PET CT 05/26/2013  Findings: Moderate sized bilateral pleural effusions are noted with areas of interlobular septal thickening and ground-glass airspace opacity.  Bronchial wall thickening is noted centrally.  Central airways are patent.  No pneumothorax.  Evidence of median sternotomy.  No acute osseous finding.  The study is of adequate technical quality for evaluation for pulmonary embolism up to and including the 3rd order pulmonary arteries.  Mild prominence of hila bilaterally is likely reactive without measurable lymphadenopathy. No focal filling defect is seen to suggest acute pulmonary embolism.  Mild pectus excavatum deformity.  Mild cardiomegaly with small pericardial effusion.  IMPRESSION: No CT evidence for acute pulmonary embolism.  Evidence of pulmonary edema with moderate bilateral pleural effusions.   Original Report  Authenticated By: Christiana Pellant, M.D.   Dg Chest Portable 1 View  06/20/2013   *RADIOLOGY REPORT*  Clinical Data: Shortness of breath.  Recent CABG.  PORTABLE CHEST - 1 VIEW  Comparison: 06/10/2013  Findings: Changes of CABG and AVR.  Stable cardiomegaly.  Diffuse interstitial and alveolar edema or infiltrates have developed bilaterally.  There is some patchy airspace consolidation in the lung bases, left greater than right.  Suspect small pleural effusions.  IMPRESSION:  1.  New bilateral edema/infiltrates with small effusions. 2.  Stable cardiomegaly and postop changes.   Original Report Authenticated By: D. Andria Rhein, MD     Assessment/Plan:  1 remains SOB, with mod Bil pleural effusions. Will benefit from US guided thoracentesis. Will order right for today 2 cont to diurese- responding well currently 3 sodium improved- cont fluid restrict 3 leukocytosis slightly improves, conts abx for now but may be reactive     GOLD,WAYNE E 06/22/2013 8:17 AM   I have seen and examined the patient and agree with the assessment and plan as outlined.  Ryshawn Sanzone H 06/22/2013 5:07 PM

## 2013-06-22 NOTE — Progress Notes (Signed)
Subjective:  Still short of breath.  CT with moderate effusions (R>L) No PE Venti mask  Objective:  Vital Signs in the last 24 hours: Temp:  [98 F (36.7 C)-98.6 F (37 C)] 98.1 F (36.7 C) (08/26 0730) Pulse Rate:  [61-79] 72 (08/26 0730) Resp:  [17-31] 18 (08/26 0730) BP: (95-146)/(29-56) 118/38 mmHg (08/26 0700) SpO2:  [85 %-99 %] 96 % (08/26 0730) FiO2 (%):  [20 %-100 %] 100 % (08/26 0400) Weight:  [73.4 kg (161 lb 13.1 oz)] 73.4 kg (161 lb 13.1 oz) (08/26 0500)  Intake/Output from previous day: 08/25 0701 - 08/26 0700 In: 883 [P.O.:460; I.V.:23; IV Piggyback:400] Out: 1190 [Urine:1190]   Physical Exam: General: Elderly, in mild distress. Head:  Normocephalic and atraumatic. Venti mask Lungs: Decreased bases. No wheeze.  Heart: Normal S1 and S2.  Soft systolic murmur, no rubs or gallops.  Abdomen: soft, non-tender, positive bowel sounds. Extremities: No clubbing or cyanosis. 1+ BLE edema. Neurologic: Alert and oriented x 3.    Lab Results:  Recent Labs  06/20/13 0944 06/20/13 2000  WBC 17.1* 16.1*  HGB 8.8* 9.4*  PLT 245 279    Recent Labs  06/21/13 0500 06/22/13 0335  NA 118* 121*  K 3.2* 3.3*  CL 83* 84*  CO2 23 26  GLUCOSE 93 105*  BUN 15 15  CREATININE 1.01 1.08    Recent Labs  06/21/13 06/21/13 0435  TROPONINI <0.30 <0.30   Hepatic Function Panel  Recent Labs  06/20/13 2000  PROT 6.1  ALBUMIN 2.4*  AST 19  ALT 16  ALKPHOS 94  BILITOT 0.8   Imaging: Ct Angio Chest Pe W/cm &/or Wo Cm  06/21/2013   *RADIOLOGY REPORT*  Clinical Data: Shortness of breath, recent CABG and wedge resection  CT ANGIOGRAPHY CHEST  Technique:  Multidetector CT imaging of the chest using the standard protocol during bolus administration of intravenous contrast. Multiplanar reconstructed images including MIPs were obtained and reviewed to evaluate the vascular anatomy.  Contrast: OMNIPAQUE IOHEXOL 350 MG/ML SOLN  Comparison: Chest radiograph 06/20/2013,  PET CT 05/26/2013  Findings: Moderate sized bilateral pleural effusions are noted with areas of interlobular septal thickening and ground-glass airspace opacity.  Bronchial wall thickening is noted centrally.  Central airways are patent.  No pneumothorax.  Evidence of median sternotomy.  No acute osseous finding.  The study is of adequate technical quality for evaluation for pulmonary embolism up to and including the 3rd order pulmonary arteries.  Mild prominence of hila bilaterally is likely reactive without measurable lymphadenopathy. No focal filling defect is seen to suggest acute pulmonary embolism.  Mild pectus excavatum deformity.  Mild cardiomegaly with small pericardial effusion.  IMPRESSION: No CT evidence for acute pulmonary embolism.  Evidence of pulmonary edema with moderate bilateral pleural effusions.   Original Report Authenticated By: Christiana Pellant, M.D.   Dg Chest Portable 1 View  06/20/2013   *RADIOLOGY REPORT*  Clinical Data: Shortness of breath.  Recent CABG.  PORTABLE CHEST - 1 VIEW  Comparison: 06/10/2013  Findings: Changes of CABG and AVR.  Stable cardiomegaly.  Diffuse interstitial and alveolar edema or infiltrates have developed bilaterally.  There is some patchy airspace consolidation in the lung bases, left greater than right.  Suspect small pleural effusions.  IMPRESSION:  1.  New bilateral edema/infiltrates with small effusions. 2.  Stable cardiomegaly and postop changes.   Original Report Authenticated By: D. Andria Rhein, MD   Personally viewed.   Telemetry: NSR Personally viewed.  Cardiac Studies:  Normal EF. Left pleural effusion  Assessment/Plan:  Principal Problem:   Acute and chronic respiratory failure Active Problems:   Orthostatic hypotension   HCAP (healthcare-associated pneumonia)   Hyponatremia    - Throacentesis  - Remains quite SOB despite net -3.2 L.  - Component of diastolic HF, atelectasis, effusions, ?HCAP  - Fluid restrict 1.5L. Hyponatremia  -  Lasix IV continue  - Holding heparin SQ for thoracentesis  - PT when appropriate  - I will decrease amio to 200mg  QD. (mild nausea)  - AVR intact, normal EF reassuring  - On IV abx as well.    SKAINS, MARK 06/22/2013, 8:36 AM

## 2013-06-22 NOTE — Progress Notes (Signed)
Thoracentesis: 500cc blood tinged, right side.  CXR, personally viewed, increased bilateral pulmonary edema pattern.   Increased lasix IV from 40mg IV to 80mg  IV BID.

## 2013-06-22 NOTE — Procedures (Signed)
US guided therapeutic right thoracentesis performed yielding 500 cc's blood-tinged fluid. F/u CXR pending. No immediate complications.

## 2013-06-22 NOTE — Progress Notes (Signed)
Subjective: No CP, still on facemask.  CTA- no PE  Objective: Vital signs in last 24 hours: Temp:  [98 F (36.7 C)-98.6 F (37 C)] 98.6 F (37 C) (08/26 0400) Pulse Rate:  [61-79] 74 (08/26 0600) Resp:  [17-31] 31 (08/26 0600) BP: (95-146)/(29-56) 130/46 mmHg (08/26 0600) SpO2:  [85 %-99 %] 98 % (08/26 0600) FiO2 (%):  [20 %-100 %] 100 % (08/26 0400) Weight:  [73.4 kg (161 lb 13.1 oz)] 73.4 kg (161 lb 13.1 oz) (08/26 0500) Weight change: -3.258 kg (-7 lb 2.9 oz) Last BM Date: 06/16/13  Intake/Output from previous day: 08/25 0701 - 08/26 0700 In: 883 [P.O.:460; I.V.:23; IV Piggyback:400] Out: 925 [Urine:925] Intake/Output this shift: Total I/O In: 320 [P.O.:100; I.V.:20; IV Piggyback:200] Out: 475 [Urine:475]  General appearance: alert Resp: diminished breath sounds bibasilar and bilaterally Cardio: regular rate and rhythm Extremities: edema +2  Lab Results:  Recent Labs  06/20/13 0944 06/20/13 2000  WBC 17.1* 16.1*  HGB 8.8* 9.4*  HCT 23.8* 25.4*  PLT 245 279   BMET  Recent Labs  06/21/13 0500 06/22/13 0335  NA 118* 121*  K 3.2* 3.3*  CL 83* 84*  CO2 23 26  GLUCOSE 93 105*  BUN 15 15  CREATININE 1.01 1.08  CALCIUM 7.7* 7.9*    Studies/Results: Ct Angio Chest Pe W/cm &/or Wo Cm  06/21/2013   *RADIOLOGY REPORT*  Clinical Data: Shortness of breath, recent CABG and wedge resection  CT ANGIOGRAPHY CHEST  Technique:  Multidetector CT imaging of the chest using the standard protocol during bolus administration of intravenous contrast. Multiplanar reconstructed images including MIPs were obtained and reviewed to evaluate the vascular anatomy.  Contrast: OMNIPAQUE IOHEXOL 350 MG/ML SOLN  Comparison: Chest radiograph 06/20/2013, PET CT 05/26/2013  Findings: Moderate sized bilateral pleural effusions are noted with areas of interlobular septal thickening and ground-glass airspace opacity.  Bronchial wall thickening is noted centrally.  Central airways are  patent.  No pneumothorax.  Evidence of median sternotomy.  No acute osseous finding.  The study is of adequate technical quality for evaluation for pulmonary embolism up to and including the 3rd order pulmonary arteries.  Mild prominence of hila bilaterally is likely reactive without measurable lymphadenopathy. No focal filling defect is seen to suggest acute pulmonary embolism.  Mild pectus excavatum deformity.  Mild cardiomegaly with small pericardial effusion.  IMPRESSION: No CT evidence for acute pulmonary embolism.  Evidence of pulmonary edema with moderate bilateral pleural effusions.   Original Report Authenticated By: Christiana Pellant, M.D.   Dg Chest Portable 1 View  06/20/2013   *RADIOLOGY REPORT*  Clinical Data: Shortness of breath.  Recent CABG.  PORTABLE CHEST - 1 VIEW  Comparison: 06/10/2013  Findings: Changes of CABG and AVR.  Stable cardiomegaly.  Diffuse interstitial and alveolar edema or infiltrates have developed bilaterally.  There is some patchy airspace consolidation in the lung bases, left greater than right.  Suspect small pleural effusions.  IMPRESSION:  1.  New bilateral edema/infiltrates with small effusions. 2.  Stable cardiomegaly and postop changes.   Original Report Authenticated By: D. Andria Rhein, MD    Medications: I have reviewed the patient's current medications.  Assessment/Plan: Respiratory failure- combination of Pleural effusion, CHF, PNA- CTA - no PE Hypoxia more from Effusions and ataectasis then infiltrate - Repeat CXR PA/Lat if possible to reevaluate left effusion if still significant- may need needle drainage. Continue ABX and lasix Replace K Hyponatremia- combination of underlying Pulmonary process- CHF/ SIADH/ [place] - Continue  Fluid restriction Check Pre albumin- nutrition consult PT consult  Anemia- stable  LOS: 2 days   Joseph Garner 06/22/2013, 6:24 AM

## 2013-06-23 ENCOUNTER — Inpatient Hospital Stay (HOSPITAL_COMMUNITY): Payer: Medicare Other

## 2013-06-23 DIAGNOSIS — J9601 Acute respiratory failure with hypoxia: Secondary | ICD-10-CM

## 2013-06-23 DIAGNOSIS — R918 Other nonspecific abnormal finding of lung field: Secondary | ICD-10-CM

## 2013-06-23 DIAGNOSIS — J9600 Acute respiratory failure, unspecified whether with hypoxia or hypercapnia: Secondary | ICD-10-CM

## 2013-06-23 LAB — PREPARE RBC (CROSSMATCH)

## 2013-06-23 LAB — POCT I-STAT 3, ART BLOOD GAS (G3+)
Bicarbonate: 26.5 mEq/L — ABNORMAL HIGH (ref 20.0–24.0)
O2 Saturation: 91 %
TCO2: 28 mmol/L (ref 0–100)
pCO2 arterial: 35 mmHg (ref 35.0–45.0)
pH, Arterial: 7.489 — ABNORMAL HIGH (ref 7.350–7.450)

## 2013-06-23 LAB — BASIC METABOLIC PANEL
BUN: 17 mg/dL (ref 6–23)
CO2: 26 mEq/L (ref 19–32)
Chloride: 86 mEq/L — ABNORMAL LOW (ref 96–112)
Creatinine, Ser: 1.06 mg/dL (ref 0.50–1.35)
GFR calc Af Amer: 71 mL/min — ABNORMAL LOW (ref >90)
Potassium: 4 mEq/L (ref 3.5–5.1)

## 2013-06-23 LAB — CBC
HCT: 22.9 % — ABNORMAL LOW (ref 39.0–52.0)
Hemoglobin: 8.4 g/dL — ABNORMAL LOW (ref 13.0–17.0)
MCV: 87.7 fL (ref 78.0–100.0)
RDW: 14.5 % (ref 11.5–15.5)
WBC: 15.4 10*3/uL — ABNORMAL HIGH (ref 4.0–10.5)

## 2013-06-23 MED ORDER — DOXAZOSIN MESYLATE 2 MG PO TABS
2.0000 mg | ORAL_TABLET | Freq: Every day | ORAL | Status: DC
  Administered 2013-06-23 – 2013-07-01 (×9): 2 mg via ORAL
  Filled 2013-06-23 (×10): qty 1

## 2013-06-23 MED ORDER — FUROSEMIDE 10 MG/ML IJ SOLN
80.0000 mg | Freq: Once | INTRAMUSCULAR | Status: AC
  Administered 2013-06-23: 80 mg via INTRAVENOUS

## 2013-06-23 NOTE — Clinical Social Work Note (Signed)
Clinical Social Work Department BRIEF PSYCHOSOCIAL ASSESSMENT 06/23/2013  Patient:  Joseph Garner, Joseph Garner     Account Number:  [id number]     Admit date:  06/20/2013  Clinical Social Worker:  Hulan Fray  Date/Time:  06/23/2013 11:41 AM  Referred by:  Care Management  Date Referred:  06/22/2013 Referred for  Other - See comment   Other Referral:   Admit from facility   Interview type:  Patient Other interview type:    PSYCHOSOCIAL DATA Living Status:  FACILITY Admitted from facility:  [facility] Level of care:  Skilled Nursing Facility Primary support name:  Rhyatt Muska Primary support relationship to patient:  SPOUSE Degree of support available:   supportive    CURRENT CONCERNS Current Concerns  Post-Acute Placement   Other Concerns:    SOCIAL WORK ASSESSMENT / PLAN Clinical Social Worker received referral for patient being admitted from facility. CSW introduced self and explained reason for visit. Per Patient he reported that he was from [facility], but is unsure if he will be returning back. CSW asked if wife could be contacted and patient was agreeable. CSW called wife and left a voice message to return call. CSW will complete FL2 for MD's signature and will continue to follow.   Assessment/plan status:  Psychosocial Support/Ongoing Assessment of Needs Other assessment/ plan:   11:31am received call that family was in room after CSW left. Per daughter, patient is not going to return back to [facility] because they felt as if the patient was not getting enough exercise and walking with him. Daughter and spouse are agreeable for CSW to start a new search in [facility] and they are also interested in CIR if he is able to tolerate it.   Information/referral to community resources:   Patient is from facility    PATIENT'S/FAMILY'S RESPONSE TO PLAN OF CARE: Per patient he was admitted from [facility]. Patient is unsure of plans at  discharge and was agreeable for CSW to contact wife.

## 2013-06-23 NOTE — Clinical Social Work Placement (Signed)
Clinical Social Work Department CLINICAL SOCIAL WORK PLACEMENT NOTE 06/23/2013  Patient:  Joseph Garner, Joseph Garner  Account Number:  [id number] Admit date:  06/20/2013  Clinical Social Worker:  Hulan Fray  Date/time:  06/23/2013 03:37 PM  Clinical Social Work is seeking post-discharge placement for this patient at the following level of care:   SKILLED NURSING   (*CSW will update this form in Epic as items are completed)   06/23/2013  Patient/family provided with [facility] Department of Clinical Social Work's list of facilities offering this level of care within the geographic area requested by the patient (or if unable, by the patient's family).  06/23/2013  Patient/family informed of their freedom to choose among providers that offer the needed level of care, that participate in Medicare, Medicaid or managed care program needed by the patient, have an available bed and are willing to accept the patient.  06/23/2013  Patient/family informed of [facility]' ownership interest in [facility], as well as of the fact that they are under no obligation to receive care at this facility.  PASARR submitted to [employer] on 06/04/2013 PASARR number received from [employer] on 06/04/2013  FL2 transmitted to all facilities in geographic area requested by pt/family on  06/23/2013 FL2 transmitted to all facilities within larger geographic area on   Patient informed that his/her managed care company has contracts with or will negotiate with  certain facilities, including the following:     Patient/family informed of bed offers received:   Patient chooses bed at  Physician recommends and patient chooses bed at    Patient to be transferred to  on   Patient to be transferred to facility by   The following physician request were entered in Epic:   Additional Comments:

## 2013-06-23 NOTE — Consult Note (Signed)
PULMONARY  / CRITICAL CARE MEDICINE  Name: Devansh Riese MRN: [id number] DOB: [birth year]    ADMISSION DATE:  06/20/2013 CONSULTATION DATE:  8/27  REFERRING MD :  Anne Fu PRIMARY SERVICE:  [place]  CHIEF COMPLAINT/reason for consult: hypoxia and pulmonary infiltrates   BRIEF PATIENT DESCRIPTION:  79yo WM with a history of HTN, GERD CAD and severe AS, s/p AVR, 1 vessel CABG and RLL wedge resection of lung for lesion on 8/8 by Dr. Tyrone Sage. Admitted on 8/24 and felt to be  in acute diastolic CHF with O2 sats 89%. PCCM asked to see on 8/27 for persistent hypoxia and pulmonary infiltrates in spite of diuretics.    SIGNIFICANT EVENTS / STUDIES:  ECHO 8/25: nml LV size, EF 65-70%, gd 2 diastolic dysfxn, mild PAH (PAS 39), Aortic Valve prosthesis w/ nml ROM CT chest 8/25: bilateral effusions, bilateral diffuse airspace disease.  Right thoracentesis 8/26: 500 ml No samples sent  LINES / TUBES:   CULTURES: bcx2 8/24>>>> Urine strep and legionella antigens 8/24: both neg   ANTIBIOTICS: Cefepime 8/24>>> vanc 8/24>>>  HISTORY OF PRESENT ILLNESS:   79yo WM with a history of HTN, GERD CAD and severe AS, s/p AVR, 1 vessel CABG and RLL wedge resection of lung for lesion on 8/8 by Dr. Tyrone Sage. Postop he had some volume overload with CHF and post op afib. He was discharged 8/19 to rehab at [facility]. He has not been ambulating well since then and his appetite has been poor so he has had poor PO intake. For the past 2 days prior to admit he has had worsening SOB. He had also had some problems with dizziness and presyncope and has been less mobile. He He also has chronic LE edema which was present before surgery but has worsened since then. He was admitted on 8/24 and felt to be  in acute diastolic CHF with O2 sats 89% on 4L but improved to 96% on 6L O2. His CXR showed diffuse bilateral airspace disease c/w edema, f/u CT scan on admit  c/w edema and bilateral effusions. Treatment to date has included: IV  diuresis, Right thoracentesis on 8/26, empiric antibiotics and Amiodarone was stopped (new med about a month ago). We have been asked to see on 8/27 in setting of persistent hypoxia and pulmonary infiltrates.  PAST MEDICAL HISTORY :  Past Medical History  Diagnosis Date  . BPH (benign prostatic hypertrophy)   . Diverticulosis 2006  . Depression   . Anemia   . Spinal stenosis   . Lung nodules 2010  . Actinic keratosis, hx of   . Macular degeneration   . Hypertension   . Allergy     allergic rhinitis  . Hearing loss     wears bilateral hearing aids  . Inguinal hernia     left side -surgery planned  . GERD (gastroesophageal reflux disease)   . Arthritis     generalized-more back issues  . Severe aortic stenosis 04/16/2013  . Nodule of right lung 05/24/2013  . CHF (congestive heart failure)   . Shortness of breath   . Atrial flutter    Past Surgical History  Procedure Laterality Date  . Tonsillectomy  1933  . Appendectomy  1947  . Neck surgery  1997    compressed nerve  . Skin biopsy  2003-2011    multiple   . Cataract extraction, bilateral      bilateral  . Inguinal hernia repair Left 02/11/2013    Procedure: LEFT LAPAROSCOPIC REPAIR INGUINAL  HERNIA WITH MESH;  Surgeon: Ernestene Mention, MD;  Location: [facility] ORS;  Service: General;  Laterality: Left;  . Insertion of mesh Left 02/11/2013    Procedure: INSERTION OF MESH;  Surgeon: Ernestene Mention, MD;  Location: [facility]S;  Service: General;  Laterality: Left;  . Hernia repair  02/24/13    lap LIH repair  . Aortic valve replacement N/A 06/04/2013    Procedure: AORTIC VALVE REPLACEMENT (AVR);  Surgeon: Delight Ovens, MD;  Location: [facility] OR;  Service: Open Heart Surgery;  Laterality: N/A;  . Intraoperative transesophageal echocardiogram N/A 06/04/2013    Procedure: INTRAOPERATIVE TRANSESOPHAGEAL ECHOCARDIOGRAM;  Surgeon: Delight Ovens, MD;  Location: [facility] OR;  Service: Open Heart Surgery;  Laterality: N/A;  . Coronary artery bypass  graft N/A 06/04/2013    Procedure: CORONARY ARTERY BYPASS GRAFTING (CABG);  Surgeon: Delight Ovens, MD;  Location: [facility] OR;  Service: Open Heart Surgery;  Laterality: N/A;  . Wedge resection Right 06/04/2013    Procedure: WEDGE RESECTION Right Lower Lobe;  Surgeon: Delight Ovens, MD;  Location: [facility] OR;  Service: Open Heart Surgery;  Laterality: Right;   Prior to Admission medications   Medication Sig Start Date End Date Taking? Authorizing Provider  amiodarone (PACERONE) 200 MG tablet Take 1 tablet (200 mg total) by mouth 2 (two) times daily. 06/15/13  Yes Erin Barrett, PA-C  aspirin EC 325 MG EC tablet Take 1 tablet (325 mg total) by mouth daily. 06/15/13  Yes Erin Barrett, PA-C  atorvastatin (LIPITOR) 20 MG tablet Take 1 tablet (20 mg total) by mouth daily at 6 PM. 06/15/13  Yes Erin Barrett, PA-C  doxazosin (CARDURA) 4 MG tablet Take 4 mg by mouth at bedtime.   Yes Historical Provider, MD  ferrous fumarate (HEMOCYTE - 106 MG FE) 325 (106 FE) MG TABS tablet Take 1 tablet (106 mg of iron total) by mouth 2 (two) times daily. 06/15/13  Yes Erin Barrett, PA-C  folic acid (FOLVITE) 1 MG tablet Take 1 tablet (1 mg total) by mouth daily. 06/15/13  Yes Erin Barrett, PA-C  furosemide (LASIX) 40 MG tablet Take 1 tablet (40 mg total) by mouth daily. 06/15/13  Yes Erin Barrett, PA-C  metoprolol tartrate (LOPRESSOR) 25 MG tablet Take 12.5 mg by mouth 2 (two) times daily.   Yes Historical Provider, MD  Multiple Vitamin (MULTIVITAMIN WITH MINERALS) TABS Take 1 tablet by mouth daily.   Yes Historical Provider, MD  Multiple Vitamins-Minerals (PRESERVISION AREDS PO) Take 2 capsules by mouth daily.    Yes Historical Provider, MD  omeprazole (PRILOSEC OTC) 20 MG tablet Take 20 mg by mouth daily.   Yes Historical Provider, MD  potassium chloride SA (K-DUR,KLOR-CON) 20 MEQ tablet Take 1 tablet (20 mEq total) by mouth daily. 06/15/13  Yes Erin Barrett, PA-C  solifenacin (VESICARE) 5 MG tablet Take 5 mg by mouth daily.   Yes  Historical Provider, MD  fluticasone (FLONASE) 50 MCG/ACT nasal spray Place 2 sprays into the nose daily as needed for allergies.     Historical Provider, MD  loratadine (CLARITIN) 10 MG tablet Take 10 mg by mouth daily as needed for allergies.     Historical Provider, MD  traMADol (ULTRAM) 50 MG tablet Take 1 tablet (50 mg total) by mouth every 6 (six) hours as needed. 06/16/13   Kimber Relic, MD   No Known Allergies  FAMILY HISTORY:  Family History  Problem Relation Age of Onset  . Prostate cancer Father   . Cancer  Mother     passed away age 50   SOCIAL HISTORY:  reports that he quit smoking about 45 years ago. His smoking use included Cigarettes. He has a 30 pack-year smoking history. He has never used smokeless tobacco. He reports that  drinks alcohol. He reports that he does not use illicit drugs.  REVIEW OF SYSTEMS bolds will be positive :   Constitutional: Negative for fever, chills, weight loss, malaise/fatigue and diaphoresis.  HENT: Negative for hearing loss, ear pain, nosebleeds, congestion, sore throat, neck pain, tinnitus and ear discharge.   Eyes: Negative for blurred vision, double vision, photophobia, pain, discharge and redness.  Respiratory: Negative for cough, hemoptysis, sputum production, shortness of breath, first noted about 3 d after d/c to SNF, progressive to the point where he can only speak 1-2 word phrases before SOB. No wheezing and stridor.   Cardiovascular: Negative for chest pain, palpitations, orthopnea, since after surg claudication, leg swelling and PND.  Gastrointestinal: Negative for heartburn, nausea, vomiting, abdominal pain, diarrhea, constipation, blood in stool and melena.  Genitourinary: Negative for dysuria, urgency, frequency, hematuria and flank pain.  Musculoskeletal: Negative for myalgias, back pain, joint pain and falls.  Skin: Negative for itching and rash.  Neurological: Negative for dizziness, tingling, tremors, sensory change, speech  change, focal weakness, seizures, loss of consciousness, weakness and headaches.  Endo/Heme/Allergies: Negative for environmental allergies and polydipsia. Does not bruise/bleed easily.  SUBJECTIVE:  No sig improvement  VITAL SIGNS: Temp:  [97.7 F (36.5 C)-99.2 F (37.3 C)] 98.9 F (37.2 C) (08/27 0400) Pulse Rate:  [59-74] 74 (08/27 0600) Resp:  [15-28] 25 (08/27 0600) BP: (98-134)/(29-81) 126/44 mmHg (08/27 0600) SpO2:  [88 %-100 %] 92 % (08/27 0600) FiO2 (%):  [100 %] 100 % (08/27 0400) Weight:  [71.3 kg (157 lb 3 oz)] 71.3 kg (157 lb 3 oz) (08/27 0500)  PHYSICAL EXAMINATION: General:  Chronically ill appearing white male, SOB after any exertion  Neuro:  Awake, HOH, moves all ext  HEENT:  [place], no JVD  Cardiovascular: rrr + click  Lungs:  Crackles in bases mild accessory muscle use after exertion  Abdomen:  Soft, no OM  Musculoskeletal:  Intact  Skin:  Dry, + LE edema    Recent Labs Lab 06/21/13 0500 06/22/13 0335 06/23/13 0420  NA 118* 121* 123*  K 3.2* 3.3* 4.0  CL 83* 84* 86*  CO2 23 26 26   BUN 15 15 17   CREATININE 1.01 1.08 1.06  GLUCOSE 93 105* 108*    Recent Labs Lab 06/20/13 0944 06/20/13 2000 06/23/13 0420  HGB 8.8* 9.4* 8.4*  HCT 23.8* 25.4* 22.9*  WBC 17.1* 16.1* 15.4*  PLT 245 279 295   PCXR Bilateral interstitial airspace disease/edema , can't exclude LLL infiltrate  ASSESSMENT / PLAN:  Acute hypoxic respiratory failure in setting of diffuse pulmonary infiltrates of unclear etiology.  Certainly think some degree of this is/was edema/ effusion in setting of decompensated diastolic dysfxn, agree HCAP or aspiration (less likely)  As is drug related pneumonitis (amiodarone). Favor mix of edema and pneumonitis. Thoracentesis performed 8/26 - unfortunately, no chemistries sent on fluid apparently.  Recommendations: -check PCT and ESR. If PCT is low could start to taper off abx (not a compelling story for PNA here) -agree with stopping amiodarone -  cont lasix - if ESR high and PCT low will trial systemic steroids.  -f/u serial CXR  Zenia Resides, ACNP Pulmonary and Critical Care Medicine [facility] Pager: [phone]  06/23/2013, 11:14 AM  PCCM ATTENDING: I have interviewed and examined the patient and reviewed the database. I have formulated the assessment and plan as reflected in the note above with amendments made by me.   Challenging diagnostic case with acute hypoxic respiratory failure and non-specific diffuse interstitial and alveolar infiltrates as wel as R>L effusions. The elevated BNP, findings of diastolic dysfunction, exam findings of peripheral edema and hyponatremia all support a dx of pulmonary edema. However, he has failed to improve clinically and radiographically with diuresis.alternative diagnoses include pneumonitis, NOS (including amiodarone related pulmonary toxicity) and infectious pneumonia (either atypical of bacterial PNA with ALI). Of course, any combination of these possibilities is likewise a consideration. I favor components of edema and pneumonitis. If ESR markedly ordered, would initiate empiric steroids. Continue diuresis and empiric antibiotics. Follow CXRs  Billy Fischer, MD;  PCCM service; Mobile [phone]

## 2013-06-23 NOTE — Progress Notes (Signed)
PT Cancellation Note  Patient Details Name: Joseph Garner MRN: [id number] DOB: 07/30/1924   Cancelled Treatment:    Reason Eval/Treat Not Completed: Medical issues which prohibited therapy. Pt remains not medically appropriate for PT per RN report. PT signing off on pt. Please re-consult when pt medically appropriate.   Marcene Brawn 06/23/2013, 1:18 PM

## 2013-06-23 NOTE — Progress Notes (Signed)
Getting unit of PRBC Giving extra lasix 80 IV. Diffuse crackles  Reviewed CCM note. PT tomorrow.

## 2013-06-23 NOTE — Progress Notes (Signed)
Subjective:  Hypoxia, PO2 56 on FM O2 Yesterday 500 cc thoracentesis right lung, no clinical change. Chest x-ray post thoracentesis actually looked worse with worsening pulmonary edema pattern  EF normal, diastolic dysfunction, grade 2  Mild confusion overnight   Objective:  Vital Signs in the last 24 hours: Temp:  [97.7 F (36.5 C)-99.2 F (37.3 C)] 98.9 F (37.2 C) (08/27 0400) Pulse Rate:  [59-74] 74 (08/27 0600) Resp:  [15-28] 25 (08/27 0600) BP: (98-134)/(29-81) 126/44 mmHg (08/27 0600) SpO2:  [88 %-100 %] 92 % (08/27 0600) FiO2 (%):  [100 %] 100 % (08/27 0400) Weight:  [71.3 kg (157 lb 3 oz)] 71.3 kg (157 lb 3 oz) (08/27 0500)  Intake/Output from previous day: 08/26 0701 - 08/27 0700 In: 1156 [P.O.:740; I.V.:16; IV Piggyback:400] Out: 2640 [Urine:2640]   Physical Exam: General: Elderly, upright in bed, in minimal  distress. Head:  Normocephalic and atraumatic. Face mask oxygen Lungs: Improved breath sounds right base, decreased breath sounds left base, crackles scattered heard throughout lung fields Heart: Normal S1 and S2.  Soft systolic murmur, no rubs or gallops.  Abdomen: soft, non-tender, positive bowel sounds. Extremities: No clubbing or cyanosis. No edema. Previously 2+ lower extremity Neurologic: Alert and oriented x 3.    Lab Results:  Recent Labs  06/20/13 2000 06/23/13 0420  WBC 16.1* 15.4*  HGB 9.4* 8.4*  PLT 279 295    Recent Labs  06/22/13 0335 06/23/13 0420  NA 121* 123*  K 3.3* 4.0  CL 84* 86*  CO2 26 26  GLUCOSE 105* 108*  BUN 15 17  CREATININE 1.08 1.06    Recent Labs  06/21/13 06/21/13 0435  TROPONINI <0.30 <0.30   Hepatic Function Panel  Recent Labs  06/20/13 2000  PROT 6.1  ALBUMIN 2.4*  AST 19  ALT 16  ALKPHOS 94  BILITOT 0.8    Imaging: Dg Chest 1 View  06/22/2013   *RADIOLOGY REPORT*  Clinical Data: Post thoracentesis, right-sided  CHEST - 1 VIEW  Comparison: 06/20/2013  Findings: Decrease in right  pleural effusion.  No significant residual effusion on the right.  No pneumothorax.  Progression of diffuse pulmonary edema since yesterday.  There remains left lower lobe atelectasis and effusion, unchanged.  IMPRESSION: Negative for pneumothorax post right thoracentesis  Progression of pulmonary edema since yesterday.   Original Report Authenticated By: Janeece Riggers, M.D.   Ct Angio Chest Pe W/cm &/or Wo Cm  06/21/2013   *RADIOLOGY REPORT*  Clinical Data: Shortness of breath, recent CABG and wedge resection  CT ANGIOGRAPHY CHEST  Technique:  Multidetector CT imaging of the chest using the standard protocol during bolus administration of intravenous contrast. Multiplanar reconstructed images including MIPs were obtained and reviewed to evaluate the vascular anatomy.  Contrast: OMNIPAQUE IOHEXOL 350 MG/ML SOLN  Comparison: Chest radiograph 06/20/2013, PET CT 05/26/2013  Findings: Moderate sized bilateral pleural effusions are noted with areas of interlobular septal thickening and ground-glass airspace opacity.  Bronchial wall thickening is noted centrally.  Central airways are patent.  No pneumothorax.  Evidence of median sternotomy.  No acute osseous finding.  The study is of adequate technical quality for evaluation for pulmonary embolism up to and including the 3rd order pulmonary arteries.  Mild prominence of hila bilaterally is likely reactive without measurable lymphadenopathy. No focal filling defect is seen to suggest acute pulmonary embolism.  Mild pectus excavatum deformity.  Mild cardiomegaly with small pericardial effusion.  IMPRESSION: No CT evidence for acute pulmonary embolism.  Evidence of  pulmonary edema with moderate bilateral pleural effusions.   Original Report Authenticated By: Christiana Pellant, M.D.   Dg Chest Port 1 View  06/23/2013   CLINICAL DATA:  Shortness of breath, CHF.  EXAM: PORTABLE CHEST - 1 VIEW  COMPARISON:  06/22/2013  FINDINGS: Prior CABG and valve replacement. Improving  but persistent pulmonary edema. Small bilateral pleural effusions. No acute bony abnormality.  IMPRESSION: Mild edema/ CHF with slight improvement since prior study. Small bilateral effusions.   Electronically Signed   By: Charlett Nose   On: 06/23/2013 08:09   Ir Thoracentesis Asp Pleural Space W/img Guide  06/22/2013   *RADIOLOGY REPORT*  Clinical Data:  CHF, pneumonia, bilateral pleural effusions. Request is made for US guided therapeutic  right thoracentesis.  ULTRASOUND GUIDED THERAPEUTIC  RIGHT  THORACENTESIS  An ultrasound guided thoracentesis was thoroughly discussed with the patient and questions answered.  The benefits, risks, alternatives and complications were also discussed.  The patient understands and wishes to proceed with the procedure.  Written consent was obtained.  Ultrasound was performed to localize and Mansel Strother an adequate pocket of fluid in the right chest.  The area was then prepped and draped in the normal sterile fashion.  1% Lidocaine was used for local anesthesia.  Under ultrasound guidance a 19 gauge Yueh catheter was introduced.  Thoracentesis was performed.  The catheter was removed and a dressing applied.  Complications:  None  Findings: A total of approximately 500 cc's of blood tinged fluid was removed.  IMPRESSION: Successful ultrasound guided therapeutic right thoracentesis yielding 500 cc's of pleural fluid.  Read by: Jeananne Rama, P.A.-C   Original Report Authenticated By: Richarda Overlie, M.D.   Personally viewed.   Telemetry: Sinus rhythm, postoperative atrial fibrillation resolved Personally viewed.    Assessment/Plan:  Principal Problem:   Acute and chronic respiratory failure Active Problems:   S/P AVR   S/P CABG x 1   Orthostatic hypotension   HCAP (healthcare-associated pneumonia)   Hyponatremia   Pleural effusion   CAD (coronary artery disease) of artery bypass graft  79 year old postop aortic valve replacement, one-vessel bypass, right lower lobe lung wedge  resection for hamartoma on 06/04/13, postop anemia, deconditioning here with acute respiratory failure, hypoxia, persistent anemia, pleural effusions.  1. Acute respiratory failure  - I contacted pulmonary critical care medicine for consultation  - Worsening appearance to bilateral pulmonary infiltrate of pattern despite continued IV Lasix.  - Yesterday increased IV Lasix to 80 mg twice a day. Improved overall output. He is net 4.6 L out  - We have also stopped amiodarone. He has been in sinus rhythm for quite some time. Amiodarone is being stopped in case this is contributing to overall pulmonary inflammatory pattern  - He is also being transfused to improve oxygen-carrying capacity. IV Lasix will be administered.  - With leukocytosis, continuing with IV antibiotics for now in case this is a hospital acquired pneumonia.  2. Acute diastolic heart failure  - Likely playing a role in his pulmonary edema pattern. Continue with aggressive IV Lasix. Heart rate well controlled. He would not tolerate atrial fibrillation very well.  - Creatinine remains 1.06.  3. Hyponatremia  - Improved with fluid restriction, IV Lasix.  4. Anemia  - Transfusion  - Hemoglobin this morning 8.4.  5. nutrition/deconditioning  - Encourage PT when able. Encourage overall nutrition.  Overall, continue with aggressive IV Lasix with the hope that the majority of his pulmonary edema is cardiogenic. Also continue to treat for possible  pneumonia and discontinuation of amiodarone.  Appreciate coordination of care with Dr. Donette Larry, Dr. Cornelius Moras and his team and now pulmonary/critical care.   Tanijah Morais 06/23/2013, 8:30 AM

## 2013-06-23 NOTE — Progress Notes (Signed)
Subjective: Pt still hypoxia S/p right thorocentesis  Objective: Vital signs in last 24 hours: Temp:  [97.7 F (36.5 C)-99.2 F (37.3 C)] 98.9 F (37.2 C) (08/27 0400) Pulse Rate:  [59-74] 74 (08/27 0600) Resp:  [15-28] 25 (08/27 0600) BP: (98-134)/(29-81) 126/44 mmHg (08/27 0600) SpO2:  [88 %-100 %] 92 % (08/27 0600) FiO2 (%):  [100 %] 100 % (08/27 0400) Weight:  [71.3 kg (157 lb 3 oz)] 71.3 kg (157 lb 3 oz) (08/27 0500) Weight change: -2.1 kg (-4 lb 10.1 oz) Last BM Date: 06/16/13  Intake/Output from previous day: 08/26 0701 - 08/27 0700 In: 1156 [P.O.:740; I.V.:16; IV Piggyback:400] Out: 2640 [Urine:2640] Intake/Output this shift:    General appearance: alert Resp: rales bibasilar and bilaterally Cardio: regular rate and rhythm  Lab Results:  Recent Labs  06/20/13 2000 06/23/13 0420  WBC 16.1* 15.4*  HGB 9.4* 8.4*  HCT 25.4* 22.9*  PLT 279 295   BMET  Recent Labs  06/22/13 0335 06/23/13 0420  NA 121* 123*  K 3.3* 4.0  CL 84* 86*  CO2 26 26  GLUCOSE 105* 108*  BUN 15 17  CREATININE 1.08 1.06  CALCIUM 7.9* 8.0*    Studies/Results: Dg Chest 1 View  06/22/2013   *RADIOLOGY REPORT*  Clinical Data: Post thoracentesis, right-sided  CHEST - 1 VIEW  Comparison: 06/20/2013  Findings: Decrease in right pleural effusion.  No significant residual effusion on the right.  No pneumothorax.  Progression of diffuse pulmonary edema since yesterday.  There remains left lower lobe atelectasis and effusion, unchanged.  IMPRESSION: Negative for pneumothorax post right thoracentesis  Progression of pulmonary edema since yesterday.   Original Report Authenticated By: Janeece Riggers, M.D.   Ct Angio Chest Pe W/cm &/or Wo Cm  06/21/2013   *RADIOLOGY REPORT*  Clinical Data: Shortness of breath, recent CABG and wedge resection  CT ANGIOGRAPHY CHEST  Technique:  Multidetector CT imaging of the chest using the standard protocol during bolus administration of intravenous contrast.  Multiplanar reconstructed images including MIPs were obtained and reviewed to evaluate the vascular anatomy.  Contrast: OMNIPAQUE IOHEXOL 350 MG/ML SOLN  Comparison: Chest radiograph 06/20/2013, PET CT 05/26/2013  Findings: Moderate sized bilateral pleural effusions are noted with areas of interlobular septal thickening and ground-glass airspace opacity.  Bronchial wall thickening is noted centrally.  Central airways are patent.  No pneumothorax.  Evidence of median sternotomy.  No acute osseous finding.  The study is of adequate technical quality for evaluation for pulmonary embolism up to and including the 3rd order pulmonary arteries.  Mild prominence of hila bilaterally is likely reactive without measurable lymphadenopathy. No focal filling defect is seen to suggest acute pulmonary embolism.  Mild pectus excavatum deformity.  Mild cardiomegaly with small pericardial effusion.  IMPRESSION: No CT evidence for acute pulmonary embolism.  Evidence of pulmonary edema with moderate bilateral pleural effusions.   Original Report Authenticated By: Christiana Pellant, M.D.   Ir Thoracentesis Asp Pleural Space W/img Guide  06/22/2013   *RADIOLOGY REPORT*  Clinical Data:  CHF, pneumonia, bilateral pleural effusions. Request is made for US guided therapeutic  right thoracentesis.  ULTRASOUND GUIDED THERAPEUTIC  RIGHT  THORACENTESIS  An ultrasound guided thoracentesis was thoroughly discussed with the patient and questions answered.  The benefits, risks, alternatives and complications were also discussed.  The patient understands and wishes to proceed with the procedure.  Written consent was obtained.  Ultrasound was performed to localize and mark an adequate pocket of fluid in the right chest.  The area was then prepped and draped in the normal sterile fashion.  1% Lidocaine was used for local anesthesia.  Under ultrasound guidance a 19 gauge Yueh catheter was introduced.  Thoracentesis was performed.  The catheter was  removed and a dressing applied.  Complications:  None  Findings: A total of approximately 500 cc's of blood tinged fluid was removed.  IMPRESSION: Successful ultrasound guided therapeutic right thoracentesis yielding 500 cc's of pleural fluid.  Read by: Jeananne Rama, P.A.-C   Original Report Authenticated By: Richarda Overlie, M.D.    Medications: I have reviewed the patient's current medications.  Assessment/Plan: Respiratory failure- pleural effusion- s/p right drained- still hypoxia- ABG- low Po2; CHF, PNA- CTA - no PE - CXR ? inflammatory picture- Pulmonary consult will be helepful- less likley infection-  Amiodarone stop- per card - repeat CXr; D/c ABX ; continue Lasix NSR; Echo EF is ok Anemia- given hypoxia- transfuse 2 units- keep HBG close to 9 or above Hyponatremia- combination of underlying Pulmonary process- CHF/ SIADH/ Hamartoma - Continue Fluid restriction  Poor nutritional and Decondionting- PT and nutritional supplement    LOS: 3 days   Joseph Garner 06/23/2013, 7:50 AM

## 2013-06-23 NOTE — Progress Notes (Signed)
Pt desat to low 80s on NRBM. Upon arrival to pt's room, pt confused and trying to get out of bed. Notified Dr. Terressa Koyanagi of pt status. Pt now 91% on NRBM. ABG ordered. Will continue to monitor closely.

## 2013-06-23 NOTE — Progress Notes (Addendum)
[facility] DAILY ICU PROGRESS NOTE                   [address]            [place] [id number]          [phone]        Total Length of Stay:  LOS: 3 days   Subjective: Conts to require high FiO2, minus 1.4 liters yesterday, also 500 cc removed via right thoracentesis  Objective: Vital signs in last 24 hours: Temp:  [97.7 F (36.5 C)-99.2 F (37.3 C)] 98.9 F (37.2 C) (08/27 0400) Pulse Rate:  [59-74] 74 (08/27 0600) Cardiac Rhythm:  [-] Normal sinus rhythm;Heart block (08/27 0400) Resp:  [15-28] 25 (08/27 0600) BP: (98-134)/(29-81) 126/44 mmHg (08/27 0600) SpO2:  [88 %-100 %] 92 % (08/27 0600) FiO2 (%):  [100 %] 100 % (08/27 0400) Weight:  [157 lb 3 oz (71.3 kg)] 157 lb 3 oz (71.3 kg) (08/27 0500)  Filed Weights   06/21/13 0500 06/22/13 0500 06/23/13 0500  Weight: 161 lb 13.1 oz (73.4 kg) 161 lb 13.1 oz (73.4 kg) 157 lb 3 oz (71.3 kg)    Weight change: -4 lb 10.1 oz (-2.1 kg)   Hemodynamic parameters for last 24 hours:    Intake/Output from previous day: 08/26 0701 - 08/27 0700 In: 1156 [P.O.:740; I.V.:16; IV Piggyback:400] Out: 2640 [Urine:2640]  Intake/Output this shift:    Current Meds: Scheduled Meds: . amiodarone  200 mg Oral Daily  . aspirin EC  325 mg Oral Daily  . atorvastatin  20 mg Oral q1800  . ceFEPime (MAXIPIME) IV  1 g Intravenous Q12H  . darifenacin  7.5 mg Oral Daily  . doxazosin  4 mg Oral QHS  . feeding supplement  1 Container Oral TID WC  . ferrous fumarate  1 tablet Oral BID  . folic acid  1 mg Oral Daily  . furosemide  80 mg Intravenous Q12H  . heparin  5,000 Units Subcutaneous Q8H  . metoprolol tartrate  12.5 mg Oral BID  . pantoprazole  40 mg Oral Daily  . sodium chloride  3 mL Intravenous Q12H  . vancomycin  750 mg Intravenous Q12H   Continuous Infusions:  PRN Meds:.sodium chloride, acetaminophen, ondansetron (ZOFRAN) IV, sodium chloride, traMADol  General appearance: alert, cooperative, fatigued and mild  distress Heart: regular rate and rhythm and soft syst murmur Lungs: dim in bases Abdomen: soft, nontender, mild dist Extremities: no LE edema Wound: healing well  Lab Results: CBC: Recent Labs  06/20/13 2000 06/23/13 0420  WBC 16.1* 15.4*  HGB 9.4* 8.4*  HCT 25.4* 22.9*  PLT 279 295   BMET:  Recent Labs  06/22/13 0335 06/23/13 0420  NA 121* 123*  K 3.3* 4.0  CL 84* 86*  CO2 26 26  GLUCOSE 105* 108*  BUN 15 17  CREATININE 1.08 1.06  CALCIUM 7.9* 8.0*    PT/INR:  Recent Labs  06/20/13 2000  LABPROT 16.1*  INR 1.32    ABG    Component Value Date/Time   PHART 7.489* 06/23/2013 0221   PCO2ART 35.0 06/23/2013 0221   PO2ART 56.0* 06/23/2013 0221   HCO3 26.5* 06/23/2013 0221   TCO2 28 06/23/2013 0221   ACIDBASEDEF 4.0* 06/05/2013 0212   O2SAT 91.0 06/23/2013 0221    Radiology: Dg Chest 1 View  06/22/2013   *RADIOLOGY REPORT*  Clinical Data: Post thoracentesis, right-sided  CHEST - 1 VIEW  Comparison: 06/20/2013  Findings: Decrease  in right pleural effusion.  No significant residual effusion on the right.  No pneumothorax.  Progression of diffuse pulmonary edema since yesterday.  There remains left lower lobe atelectasis and effusion, unchanged.  IMPRESSION: Negative for pneumothorax post right thoracentesis  Progression of pulmonary edema since yesterday.   Original Report Authenticated By: Janeece Riggers, M.D.   Ct Angio Chest Pe W/cm &/or Wo Cm  06/21/2013   *RADIOLOGY REPORT*  Clinical Data: Shortness of breath, recent CABG and wedge resection  CT ANGIOGRAPHY CHEST  Technique:  Multidetector CT imaging of the chest using the standard protocol during bolus administration of intravenous contrast. Multiplanar reconstructed images including MIPs were obtained and reviewed to evaluate the vascular anatomy.  Contrast: OMNIPAQUE IOHEXOL 350 MG/ML SOLN  Comparison: Chest radiograph 06/20/2013, PET CT 05/26/2013  Findings: Moderate sized bilateral pleural effusions are noted with  areas of interlobular septal thickening and ground-glass airspace opacity.  Bronchial wall thickening is noted centrally.  Central airways are patent.  No pneumothorax.  Evidence of median sternotomy.  No acute osseous finding.  The study is of adequate technical quality for evaluation for pulmonary embolism up to and including the 3rd order pulmonary arteries.  Mild prominence of hila bilaterally is likely reactive without measurable lymphadenopathy. No focal filling defect is seen to suggest acute pulmonary embolism.  Mild pectus excavatum deformity.  Mild cardiomegaly with small pericardial effusion.  IMPRESSION: No CT evidence for acute pulmonary embolism.  Evidence of pulmonary edema with moderate bilateral pleural effusions.   Original Report Authenticated By: Christiana Pellant, M.D.   Ir Thoracentesis Asp Pleural Space W/img Guide  06/22/2013   *RADIOLOGY REPORT*  Clinical Data:  CHF, pneumonia, bilateral pleural effusions. Request is made for US guided therapeutic  right thoracentesis.  ULTRASOUND GUIDED THERAPEUTIC  RIGHT  THORACENTESIS  An ultrasound guided thoracentesis was thoroughly discussed with the patient and questions answered.  The benefits, risks, alternatives and complications were also discussed.  The patient understands and wishes to proceed with the procedure.  Written consent was obtained.  Ultrasound was performed to localize and mark an adequate pocket of fluid in the right chest.  The area was then prepped and draped in the normal sterile fashion.  1% Lidocaine was used for local anesthesia.  Under ultrasound guidance a 19 gauge Yueh catheter was introduced.  Thoracentesis was performed.  The catheter was removed and a dressing applied.  Complications:  None  Findings: A total of approximately 500 cc's of blood tinged fluid was removed.  IMPRESSION: Successful ultrasound guided therapeutic right thoracentesis yielding 500 cc's of pleural fluid.  Read by: Jeananne Rama, P.A.-C   Original  Report Authenticated By: Richarda Overlie, M.D.     Assessment/Plan: 1 Have discussed with medicine and cardiology. Gas exchange is significant issue from parenchymal / edema findings.  2 plan is to d/c amio, obtain pulm consult and transfuse. Cont aggressive diuresis. Hold off on left thoracentesis for now   GOLD,WAYNE E 06/23/2013 7:30 AM  I have seen and examined the patient and agree with the assessment and plan as outlined.  I think primary problem remains CHF with possible HCAP.  Although Mr Hires' has normal LV systolic function, he has significant LVH and chronic diastolic dysfunction.  He also remains somewhat anemic, and given the circumstances I agree with plans for transfusion to improve O2 carrying capacity and facilitate further diuresis.  Stopping amiodarone is reasonable, although he really hasn't been on it very long and he likely will not do  well if PAF recurs.  In addition, I would encourage as much mobility as possible.  Keeping Mr Burkett in bed for a prolonged period of time will likely only make matters worse.  Resume phase I cardiac rehab as soon as possible.  Kailana Benninger H 06/23/2013 8:27 AM

## 2013-06-23 NOTE — Clinical Social Work Note (Deleted)
Clinical Social Work Department BRIEF PSYCHOSOCIAL ASSESSMENT 06/23/2013  Patient:  Joseph Garner, Joseph Garner     Account Number:  [id number]     Admit date:  10/02/2010  Clinical Social Worker:  Hulan Fray  Date/Time:  06/23/2013 11:12 AM  Referred by:  Care Management  Date Referred:  06/22/2013 Referred for  Other - See comment   Other Referral:   Admit from facility   Interview type:  Patient Other interview type:    PSYCHOSOCIAL DATA Living Status:  FACILITY Admitted from facility:  [facility] Level of care:  Skilled Nursing Facility Primary support name:  Kace Hartje Primary support relationship to patient:  SPOUSE Degree of support available:   supportive    CURRENT CONCERNS Current Concerns  Post-Acute Placement   Other Concerns:    SOCIAL WORK ASSESSMENT / PLAN Clinical Social Worker received referral for patient being admitted from facility. CSW introduced self and explained reason for visit. Per Patient he reported that he was from [facility], but is unsure if he will be returning back. CSW asked if wife could be contacted and patient was agreeable. CSW called wife and left a voice message to return call. CSW will complete FL2 for MD's signature and will continue to follow.   Assessment/plan status:  Psychosocial Support/Ongoing Assessment of Needs Other assessment/ plan: 11:31am received call that family was in room after CSW left. Per daughter, patient is not going to return back to [facility] because they felt as if the patient was not getting enough exercise and walking with him. Daughter and spouse are agreeable for CSW to start a new search in [facility] and they are also interested in CIR if he is able to tolerate it.    Information/referral to community resources:   Patient is from facility    PATIENT'S/FAMILY'S RESPONSE TO PLAN OF CARE: Per patient he was admitted from [facility]. Patient is unsure of plans at discharge  and was agreeable for CSW to contact wife.

## 2013-06-24 ENCOUNTER — Inpatient Hospital Stay (HOSPITAL_COMMUNITY): Payer: Medicare Other

## 2013-06-24 DIAGNOSIS — J9620 Acute and chronic respiratory failure, unspecified whether with hypoxia or hypercapnia: Secondary | ICD-10-CM

## 2013-06-24 LAB — BASIC METABOLIC PANEL
BUN: 24 mg/dL — ABNORMAL HIGH (ref 6–23)
Chloride: 87 mEq/L — ABNORMAL LOW (ref 96–112)
Creatinine, Ser: 1.18 mg/dL (ref 0.50–1.35)
GFR calc Af Amer: 62 mL/min — ABNORMAL LOW (ref >90)
Glucose, Bld: 100 mg/dL — ABNORMAL HIGH (ref 70–99)

## 2013-06-24 LAB — CBC
HCT: 28.2 % — ABNORMAL LOW (ref 39.0–52.0)
MCV: 87 fL (ref 78.0–100.0)
RDW: 14.7 % (ref 11.5–15.5)
WBC: 14 10*3/uL — ABNORMAL HIGH (ref 4.0–10.5)

## 2013-06-24 LAB — TYPE AND SCREEN
ABO/RH(D): O NEG
Antibody Screen: NEGATIVE
Unit division: 0

## 2013-06-24 MED ORDER — POTASSIUM CHLORIDE CRYS ER 20 MEQ PO TBCR
40.0000 meq | EXTENDED_RELEASE_TABLET | Freq: Two times a day (BID) | ORAL | Status: AC
  Administered 2013-06-24 – 2013-06-25 (×4): 40 meq via ORAL
  Filled 2013-06-24 (×4): qty 2

## 2013-06-24 MED ORDER — POTASSIUM CHLORIDE CRYS ER 20 MEQ PO TBCR
40.0000 meq | EXTENDED_RELEASE_TABLET | Freq: Once | ORAL | Status: AC
  Administered 2013-06-24: 40 meq via ORAL
  Filled 2013-06-24: qty 2

## 2013-06-24 MED ORDER — METHYLPREDNISOLONE SODIUM SUCC 125 MG IJ SOLR
60.0000 mg | Freq: Four times a day (QID) | INTRAMUSCULAR | Status: DC
  Administered 2013-06-24 – 2013-06-26 (×8): 60 mg via INTRAVENOUS
  Filled 2013-06-24: qty 2
  Filled 2013-06-24 (×5): qty 0.96
  Filled 2013-06-24: qty 2
  Filled 2013-06-24: qty 0.96
  Filled 2013-06-24: qty 2
  Filled 2013-06-24 (×2): qty 0.96
  Filled 2013-06-24: qty 2

## 2013-06-24 NOTE — Evaluation (Signed)
Physical Therapy Evaluation Patient Details Name: Joseph Garner MRN: [id number] DOB: 06-13-1924 Today's Date: 06/24/2013 Time: 1478-2956 PT Time Calculation (min): 24 min  PT Assessment / Plan / Recommendation History of Present Illness  79yo WM with a history of HTN, GERD CAD and severe AS, s/p AVR, 1 vessel CABG and RLL wedge resection of lung for lesion on 8/8 by Dr. Tyrone Sage. Admitted on 8/24 and felt to be  in acute diastolic CHF with O2 sats 89%. PCCM asked to see on 8/27 for persistent hypoxia and pulmonary infiltrates in spite of diuretics.     Clinical Impression  Pt admitted with recent CABG d/c to SNF and currently readmitted for CHF. Pt currently with functional limitations due to the deficits listed below (see PT Problem List). Family stated pt was not walking enough at SNF and felt like pt was getting weaker.  Pt may benefit from more intense therapy per family therefore recommending inpatient rehab. Pt will benefit from skilled PT to increase their independence and safety with mobility to allow discharge to the venue listed below.      PT Assessment  Patient needs continued PT services    Follow Up Recommendations  CIR    Equipment Recommendations  None recommended by PT    Recommendations for Other Services Rehab consult   Frequency Min 3X/week    Precautions / Restrictions Precautions Precautions: Fall;Sternal Precaution Comments: Monitor HR, DOE, and O2 sats with mobility; currently on partial Non-rebreather   Pertinent Vitals/Pain No c/o pain; vitals maintained on non-rebeather      Mobility  Bed Mobility Bed Mobility: Sit to Supine Sit to Supine: 3: Mod assist Details for Bed Mobility Assistance: assist for LE elevation and positioning in bed Transfers Transfers: Sit to Stand;Stand to Sit Sit to Stand: 1: +2 Total assist;From bed Sit to Stand: Patient Percentage: 60% Stand to Sit: 1: +2 Total assist;To chair/3-in-1 Stand to Sit: Patient Percentage:  60% Stand Pivot Transfers: 1: +2 Total assist Stand Pivot Transfers: Patient Percentage: 60% Details for Transfer Assistance: +2 (A) to maintain balance and due to sternal precautions needs extra assistance Ambulation/Gait Ambulation/Gait Assistance: Not tested (comment)    Exercises     PT Diagnosis: Difficulty walking;Generalized weakness  PT Problem List: Decreased strength;Decreased activity tolerance;Decreased balance;Decreased mobility PT Treatment Interventions: DME instruction;Gait training;Stair training;Functional mobility training;Therapeutic activities;Therapeutic exercise;Balance training;Patient/family education     PT Goals(Current goals can be found in the care plan section) Acute Rehab PT Goals Patient Stated Goal: to get back to independent lifestyle PT Goal Formulation: With patient Time For Goal Achievement: 06/21/13 Potential to Achieve Goals: Good  Visit Information  Last PT Received On: 06/24/13 History of Present Illness: 79yo WM with a history of HTN, GERD CAD and severe AS, s/p AVR, 1 vessel CABG and RLL wedge resection of lung for lesion on 8/8 by Dr. Tyrone Sage. Admitted on 8/24 and felt to be  in acute diastolic CHF with O2 sats 89%. PCCM asked to see on 8/27 for persistent hypoxia and pulmonary infiltrates in spite of diuretics.          Prior Functioning  Home Living Family/patient expects to be discharged to:: Inpatient rehab Living Arrangements: Spouse/significant other Available Help at Discharge: Family;Available 24 hours/day Type of Home: House Home Access: Stairs to enter [employer] of Steps: 6 Entrance Stairs-Rails: Left Home Layout: One level Home Equipment: None;Other (comment) Prior Function Level of Independence: Independent Comments: was independent and walking any distance until his hernia surgery in April  of 2014 since then he has limited ambulation which got significantly worse over the past week.    Communication Communication: HOH    Cognition  Cognition Arousal/Alertness: Awake/alert Behavior During Therapy: WFL for tasks assessed/performed Overall Cognitive Status: Within Functional Limits for tasks assessed    Extremity/Trunk Assessment Lower Extremity Assessment Lower Extremity Assessment: Generalized weakness Cervical / Trunk Assessment Cervical / Trunk Assessment: Normal   Balance Dynamic Standing Balance Dynamic Standing - Balance Support: Bilateral upper extremity supported Dynamic Standing - Level of Assistance: 1: +2 Total assist;Patient percentage (comment) Dynamic Standing - Balance Activities: Forward lean/weight shifting;Lateral lean/weight shifting;Other (comment) (Marching)  End of Session PT - End of Session Equipment Utilized During Treatment: Gait belt Activity Tolerance: Patient limited by fatigue Patient left: in chair;with call bell/phone within reach Nurse Communication: Mobility status;Precautions  GP     Joseph Garner 06/24/2013, 4:38 PM  [name], PT DPT [phone]

## 2013-06-24 NOTE — Progress Notes (Addendum)
[facility] DAILY ICU PROGRESS NOTE                   [address]            [place] [address]          [phone]        Total Length of Stay:  LOS: 4 days   Subjective: Feels like breathing is a little better.  Objective: Vital signs in last 24 hours: Temp:  [97.7 F (36.5 C)-99.1 F (37.3 C)] 98.6 F (37 C) (08/28 0000) Pulse Rate:  [69-85] 83 (08/28 0600) Cardiac Rhythm:  [-] Normal sinus rhythm;Heart block (08/27 2000) Resp:  [16-29] 28 (08/28 0600) BP: (94-154)/(35-55) 154/35 mmHg (08/28 0600) SpO2:  [84 %-98 %] 92 % (08/28 0600) FiO2 (%):  [100 %] 100 % (08/27 2000) Weight:  [156 lb 1.4 oz (70.8 kg)] 156 lb 1.4 oz (70.8 kg) (08/28 0500)  Filed Weights   06/22/13 0500 06/23/13 0500 06/24/13 0500  Weight: 161 lb 13.1 oz (73.4 kg) 157 lb 3 oz (71.3 kg) 156 lb 1.4 oz (70.8 kg)    Weight change: -1 lb 1.6 oz (-0.5 kg)   Hemodynamic parameters for last 24 hours:    Intake/Output from previous day: 08/27 0701 - 08/28 0700 In: 1335 [P.O.:660; Blood:675] Out: 2230 [Urine:2230]  Intake/Output this shift:    Current Meds: Scheduled Meds: . aspirin EC  325 mg Oral Daily  . atorvastatin  20 mg Oral q1800  . darifenacin  7.5 mg Oral Daily  . doxazosin  2 mg Oral QHS  . feeding supplement  1 Container Oral TID WC  . ferrous fumarate  1 tablet Oral BID  . folic acid  1 mg Oral Daily  . furosemide  80 mg Intravenous Q12H  . heparin  5,000 Units Subcutaneous Q8H  . metoprolol tartrate  12.5 mg Oral BID  . pantoprazole  40 mg Oral Daily  . sodium chloride  3 mL Intravenous Q12H   Continuous Infusions:  PRN Meds:.sodium chloride, acetaminophen, ondansetron (ZOFRAN) IV, sodium chloride, traMADol  General appearance: alert, cooperative and no distress Heart: regular rate and rhythm Lungs: dim in bases, mildly, some crackles Abdomen: soft, nontender, nondistended Extremities: no signif edema Wound: healing well  Lab Results: CBC: Recent Labs  06/23/13 0420 06/24/13 0425  WBC 15.4* 14.0*  HGB 8.4* 10.3*  HCT 22.9* 28.2*  PLT 295 282   BMET:  Recent Labs  06/23/13 0420 06/24/13 0425  NA 123* 126*  K 4.0 3.3*  CL 86* 87*  CO2 26 26  GLUCOSE 108* 100*  BUN 17 24*  CREATININE 1.06 1.18  CALCIUM 8.0* 8.4    PT/INR: No results found for this basename: LABPROT, INR,  in the last 72 hours Radiology: Dg Chest 1 View  06/22/2013   *RADIOLOGY REPORT*  Clinical Data: Post thoracentesis, right-sided  CHEST - 1 VIEW  Comparison: 06/20/2013  Findings: Decrease in right pleural effusion.  No significant residual effusion on the right.  No pneumothorax.  Progression of diffuse pulmonary edema since yesterday.  There remains left lower lobe atelectasis and effusion, unchanged.  IMPRESSION: Negative for pneumothorax post right thoracentesis  Progression of pulmonary edema since yesterday.   Original Report Authenticated By: Janeece Riggers, M.D.   Dg Chest Port 1 View  06/23/2013   CLINICAL DATA:  Shortness of breath, CHF.  EXAM: PORTABLE CHEST - 1 VIEW  COMPARISON:  06/22/2013  FINDINGS: Prior CABG and valve replacement. Improving  but persistent pulmonary edema. Small bilateral pleural effusions. No acute bony abnormality.  IMPRESSION: Mild edema/ CHF with slight improvement since prior study. Small bilateral effusions.   Electronically Signed   By: Charlett Nose   On: 06/23/2013 08:09   Ir Thoracentesis Asp Pleural Space W/img Guide  06/22/2013   *RADIOLOGY REPORT*  Clinical Data:  CHF, pneumonia, bilateral pleural effusions. Request is made for US guided therapeutic  right thoracentesis.  ULTRASOUND GUIDED THERAPEUTIC  RIGHT  THORACENTESIS  An ultrasound guided thoracentesis was thoroughly discussed with the patient and questions answered.  The benefits, risks, alternatives and complications were also discussed.  The patient understands and wishes to proceed with the procedure.  Written consent was obtained.  Ultrasound was performed to localize  and mark an adequate pocket of fluid in the right chest.  The area was then prepped and draped in the normal sterile fashion.  1% Lidocaine was used for local anesthesia.  Under ultrasound guidance a 19 gauge Yueh catheter was introduced.  Thoracentesis was performed.  The catheter was removed and a dressing applied.  Complications:  None  Findings: A total of approximately 500 cc's of blood tinged fluid was removed.  IMPRESSION: Successful ultrasound guided therapeutic right thoracentesis yielding 500 cc's of pleural fluid.  Read by: Jeananne Rama, P.A.-C   Original Report Authenticated By: Richarda Overlie, M.D.     Assessment/Plan: 1 Stable with some clinical improvement. No surgical issues per se. Cont aggressive management per Cardiol/Pulm/medicine. Considering addition of steroids. Abx have been stopped.      GOLD,WAYNE E 06/24/2013 7:32 AM   I have seen and examined the patient and agree with the assessment and plan as outlined.  Joseph Garner looks and feels a little better.  O2 requirement decreased.  He looks much less fluid overloaded on exam although he still has significant bilateral inspiratory rales.  Oral intake remains terrible.  He is severely malnourished.  He might need feeding tube placement if he cannot do better with eating and taking nutritional supplements.  I have encouraged his family to bring him ice cream, smoothies, or anything else that he might eat as long as the salt content is low.  OWEN,CLARENCE H 06/24/2013 5:09 PM

## 2013-06-24 NOTE — Progress Notes (Signed)
Subjective:  FM O2 continued PRBC yesterday Feels a little bit better he states. CCM consult  Objective:  Vital Signs in the last 24 hours: Temp:  [97.6 F (36.4 C)-99.1 F (37.3 C)] 97.6 F (36.4 C) (08/28 0800) Pulse Rate:  [69-85] 85 (08/28 0800) Resp:  [17-32] 23 (08/28 0800) BP: (94-154)/(35-55) 129/40 mmHg (08/28 0800) SpO2:  [84 %-98 %] 93 % (08/28 0800) FiO2 (%):  [100 %] 100 % (08/27 2000) Weight:  [70.8 kg (156 lb 1.4 oz)] 70.8 kg (156 lb 1.4 oz) (08/28 0500)  Intake/Output from previous day: 08/27 0701 - 08/28 0700 In: 1335 [P.O.:660; Blood:675] Out: 2230 [Urine:2230]   Physical Exam: General: Elderly, upright in bed, in minimal distress.  Head: Normocephalic and atraumatic. Face mask oxygen  Lungs: Improved breath sounds right base, decreased breath sounds left base, crackles scattered heard throughout lung fields  Heart: Normal S1 and S2. Soft systolic murmur, no rubs or gallops.  Abdomen: soft, non-tender, positive bowel sounds.  Extremities: No clubbing or cyanosis. No edema. Previously 2+ lower extremity  Neurologic: Alert and oriented x 3.    Lab Results:  Recent Labs  06/23/13 0420 06/24/13 0425  WBC 15.4* 14.0*  HGB 8.4* 10.3*  PLT 295 282    Recent Labs  06/23/13 0420 06/24/13 0425  NA 123* 126*  K 4.0 3.3*  CL 86* 87*  CO2 26 26  GLUCOSE 108* 100*  BUN 17 24*  CREATININE 1.06 1.18  Imaging: Dg Chest 1 View  06/22/2013   *RADIOLOGY REPORT*  Clinical Data: Post thoracentesis, right-sided  CHEST - 1 VIEW  Comparison: 06/20/2013  Findings: Decrease in right pleural effusion.  No significant residual effusion on the right.  No pneumothorax.  Progression of diffuse pulmonary edema since yesterday.  There remains left lower lobe atelectasis and effusion, unchanged.  IMPRESSION: Negative for pneumothorax post right thoracentesis  Progression of pulmonary edema since yesterday.   Original Report Authenticated By: Janeece Riggers, M.D.   Dg Chest  Port 1 View  06/23/2013   CLINICAL DATA:  Shortness of breath, CHF.  EXAM: PORTABLE CHEST - 1 VIEW  COMPARISON:  06/22/2013  FINDINGS: Prior CABG and valve replacement. Improving but persistent pulmonary edema. Small bilateral pleural effusions. No acute bony abnormality.  IMPRESSION: Mild edema/ CHF with slight improvement since prior study. Small bilateral effusions.   Electronically Signed   By: Charlett Nose   On: 06/23/2013 08:09   Ir Thoracentesis Asp Pleural Space W/img Guide  06/22/2013   *RADIOLOGY REPORT*  Clinical Data:  CHF, pneumonia, bilateral pleural effusions. Request is made for US guided therapeutic  right thoracentesis.  ULTRASOUND GUIDED THERAPEUTIC  RIGHT  THORACENTESIS  An ultrasound guided thoracentesis was thoroughly discussed with the patient and questions answered.  The benefits, risks, alternatives and complications were also discussed.  The patient understands and wishes to proceed with the procedure.  Written consent was obtained.  Ultrasound was performed to localize and mark an adequate pocket of fluid in the right chest.  The area was then prepped and draped in the normal sterile fashion.  1% Lidocaine was used for local anesthesia.  Under ultrasound guidance a 19 gauge Yueh catheter was introduced.  Thoracentesis was performed.  The catheter was removed and a dressing applied.  Complications:  None  Findings: A total of approximately 500 cc's of blood tinged fluid was removed.  IMPRESSION: Successful ultrasound guided therapeutic right thoracentesis yielding 500 cc's of pleural fluid.  Read by: Jeananne Rama, P.A.-C  Original Report Authenticated By: Richarda Overlie, M.D.   Personally viewed.   Telemetry: *1st degree AVB, PAC's. Labeled afib but I do not believe this to be the case. Personally viewed.     Cardiac Studies:  Normal EF, DD  Assessment/Plan:  Principal Problem:   Acute and chronic respiratory failure Active Problems:   S/P AVR   S/P CABG x 1   Orthostatic  hypotension   HCAP (healthcare-associated pneumonia)   Hyponatremia   Pleural effusion   CAD (coronary artery disease) of artery bypass graft   Acute respiratory failure with hypoxia   Pulmonary infiltrates  79 year old postop aortic valve replacement, one-vessel bypass, right lower lobe lung wedge resection for hamartoma on 06/04/13, postop anemia, deconditioning here with acute respiratory failure, hypoxia, persistent anemia, pleural effusions.    1) Acute resp failure  - CCM  - steriod trial. ?Pneumonitis/inflammatory response  - off amiodarone   - Procalcitonin normal  - ESR 60  - PRBC improved HG. O2 carrying capacity.   - supportive care, OOB, PT  - IV diuresis continued Creat 1.18, stable  - CXR may be slightly improved from edema standpoint.   2) Acute diastolic HF  - as above  - lasix  - normal EF  3) First degree AVB/PAC  - tele. I do not believe this is AFIB in strips that I was able to review.   - Off amio  - Would not tolerate AFIB with RVR well from hemodynamic standpoint if this did occur  4) CAD  - single vessel CABG  - stable  5) AV replacement  - bioprosthetic  - normal on ECHO  6) hyponatremia  - improving with diuresis  - multifact. SIADH, lung process, HF  7) Deconditioning  - PT, nutrition  8) HEP DVT PROPH, Protonix GI proph  9) Hypokalemia  - replete  10) Anemia  - improved with PRBC   Overall see mild improvement today.   SKAINS, MARK 06/24/2013, 8:25 AM

## 2013-06-24 NOTE — Clinical Social Work Note (Signed)
Clinical Social Worker left list of bed offers received and SNF packet in patient's room for family to review. CSW will follow up for confirmation on SNF placement.   Rozetta Nunnery MSW, [employer] [phone]

## 2013-06-24 NOTE — Progress Notes (Signed)
Rehab Admissions Coordinator Note:  Patient was screened by Brock Ra for appropriateness for an Inpatient Acute Rehab Consult.  At this time, we are recommending Inpatient Rehab consult.  Brock Ra 06/24/2013, 5:29 PM  I can be reached at [phone].

## 2013-06-24 NOTE — Progress Notes (Signed)
Subjective: CXR still unchanged CCM input noted Sed Rate  elevated  Objective: Vital signs in last 24 hours: Temp:  [97.7 F (36.5 C)-99.1 F (37.3 C)] 98.6 F (37 C) (08/28 0000) Pulse Rate:  [69-85] 83 (08/28 0600) Resp:  [16-29] 28 (08/28 0600) BP: (94-154)/(35-55) 154/35 mmHg (08/28 0600) SpO2:  [84 %-98 %] 92 % (08/28 0600) FiO2 (%):  [100 %] 100 % (08/27 2000) Weight:  [70.8 kg (156 lb 1.4 oz)] 70.8 kg (156 lb 1.4 oz) (08/28 0500) Weight change: -0.5 kg (-1 lb 1.6 oz) Last BM Date: 06/16/13  Intake/Output from previous day: 08/27 0701 - 08/28 0700 In: 1335 [P.O.:660; Blood:675] Out: 2230 [Urine:2230] Intake/Output this shift:    General appearance: alert Resp: bilat Crackle Cardio: regular rate and rhythm  Lab Results:  Recent Labs  06/23/13 0420 06/24/13 0425  WBC 15.4* 14.0*  HGB 8.4* 10.3*  HCT 22.9* 28.2*  PLT 295 282   BMET  Recent Labs  06/23/13 0420 06/24/13 0425  NA 123* 126*  K 4.0 3.3*  CL 86* 87*  CO2 26 26  GLUCOSE 108* 100*  BUN 17 24*  CREATININE 1.06 1.18  CALCIUM 8.0* 8.4    Studies/Results: Dg Chest 1 View  06/22/2013   *RADIOLOGY REPORT*  Clinical Data: Post thoracentesis, right-sided  CHEST - 1 VIEW  Comparison: 06/20/2013  Findings: Decrease in right pleural effusion.  No significant residual effusion on the right.  No pneumothorax.  Progression of diffuse pulmonary edema since yesterday.  There remains left lower lobe atelectasis and effusion, unchanged.  IMPRESSION: Negative for pneumothorax post right thoracentesis  Progression of pulmonary edema since yesterday.   Original Report Authenticated By: Janeece Riggers, M.D.   Dg Chest Port 1 View  06/23/2013   CLINICAL DATA:  Shortness of breath, CHF.  EXAM: PORTABLE CHEST - 1 VIEW  COMPARISON:  06/22/2013  FINDINGS: Prior CABG and valve replacement. Improving but persistent pulmonary edema. Small bilateral pleural effusions. No acute bony abnormality.  IMPRESSION: Mild edema/ CHF  with slight improvement since prior study. Small bilateral effusions.   Electronically Signed   By: Charlett Nose   On: 06/23/2013 08:09   Ir Thoracentesis Asp Pleural Space W/img Guide  06/22/2013   *RADIOLOGY REPORT*  Clinical Data:  CHF, pneumonia, bilateral pleural effusions. Request is made for US guided therapeutic  right thoracentesis.  ULTRASOUND GUIDED THERAPEUTIC  RIGHT  THORACENTESIS  An ultrasound guided thoracentesis was thoroughly discussed with the patient and questions answered.  The benefits, risks, alternatives and complications were also discussed.  The patient understands and wishes to proceed with the procedure.  Written consent was obtained.  Ultrasound was performed to localize and mark an adequate pocket of fluid in the right chest.  The area was then prepped and draped in the normal sterile fashion.  1% Lidocaine was used for local anesthesia.  Under ultrasound guidance a 19 gauge Yueh catheter was introduced.  Thoracentesis was performed.  The catheter was removed and a dressing applied.  Complications:  None  Findings: A total of approximately 500 cc's of blood tinged fluid was removed.  IMPRESSION: Successful ultrasound guided therapeutic right thoracentesis yielding 500 cc's of pleural fluid.  Read by: Jeananne Rama, P.A.-C   Original Report Authenticated By: Richarda Overlie, M.D.    Medications: I have reviewed the patient's current medications.  Assessment/Plan: Respiratory failure- pleural effusion- s/p right drained- CCM input noted- favour toward Pnumonitis- mild sed rate elevated-  Trail [employer] Steroids - off ABX continue  lasix NSR; Echo EF is ok  Anemia- better after transfusion Hyponatremia- combination of underlying Pulmonary process- CHF/ SIADH/ Hamartoma - Continue Fluid restriction  Poor nutritional and Decondionting- PT and nutritional supplement      LOS: 4 days   Joseph Garner 06/24/2013, 7:43 AM

## 2013-06-24 NOTE — Consult Note (Addendum)
PULMONARY  / CRITICAL CARE MEDICINE  Name: Joseph Garner MRN: [id number] DOB: 1924/04/16    ADMISSION DATE:  06/20/2013 CONSULTATION DATE:  8/27  REFERRING MD :  Anne Fu PRIMARY SERVICE:  [place]  CHIEF COMPLAINT/reason for consult: hypoxia and pulmonary infiltrates   BRIEF PATIENT DESCRIPTION:  79yo WM with a history of HTN, GERD CAD and severe AS, s/p AVR, 1 vessel CABG and RLL wedge resection of lung for lesion on 8/8 by Dr. Tyrone Sage. Admitted on 8/24 and felt to be  in acute diastolic CHF with O2 sats 89%. PCCM asked to see on 8/27 for persistent hypoxia and pulmonary infiltrates in spite of diuretics.    SIGNIFICANT EVENTS / STUDIES:  ECHO 8/25: nml LV size, EF 65-70%, gd 2 diastolic dysfxn, mild PAH (PAS 39), Aortic Valve prosthesis w/ nml ROM CT chest 8/25: bilateral effusions, bilateral diffuse airspace disease.  Right thoracentesis 8/26: 500 ml No samples sent   LINES / TUBES:  CULTURES: bcx2 8/24>>>> Urine strep and legionella antigens 8/24: both neg   ANTIBIOTICS: Cefepime 8/24>>> vanc 8/24>>>  SUBJECTIVE:  Remains on 100% NRB  VITAL SIGNS: Temp:  [97.6 F (36.4 C)-99.1 F (37.3 C)] 97.6 F (36.4 C) (08/28 0800) Pulse Rate:  [69-85] 85 (08/28 0800) Resp:  [17-32] 23 (08/28 0800) BP: (94-154)/(35-55) 129/40 mmHg (08/28 0800) SpO2:  [84 %-97 %] 93 % (08/28 0800) FiO2 (%):  [100 %] 100 % (08/27 2000) Weight:  [70.8 kg (156 lb 1.4 oz)] 70.8 kg (156 lb 1.4 oz) (08/28 0500)  PHYSICAL EXAMINATION: General:  Chronically ill appearing white male, mild distress, sleeping Neuro:  Awake, HOH, moves all ext  HEENT:  [place], no JVD  Cardiovascular: rrr + click  Lungs:  Crackles diffuse Abdomen:  Soft, no OM  Musculoskeletal:  Intact  Skin:  Dry, + LE edema    Recent Labs Lab 06/22/13 0335 06/23/13 0420 06/24/13 0425  NA 121* 123* 126*  K 3.3* 4.0 3.3*  CL 84* 86* 87*  CO2 26 26 26   BUN 15 17 24*  CREATININE 1.08 1.06 1.18  GLUCOSE 105* 108* 100*    Recent  Labs Lab 06/20/13 2000 06/23/13 0420 06/24/13 0425  HGB 9.4* 8.4* 10.3*  HCT 25.4* 22.9* 28.2*  WBC 16.1* 15.4* 14.0*  PLT 279 295 282   PCXR Bilateral interstitial airspace disease/edema increased even given penetration difference from day prior ASSESSMENT / PLAN:  Acute hypoxic respiratory failure in setting of diffuse pulmonary infiltrates of unclear etiology.  Certainly think some degree of this is/was edema/ effusion in setting of decompensated diastolic dysfxn, agree HCAP or aspiration (less likely)  As is drug related pneumonitis (amiodarone). Favor mix of edema and pneumonitis. Thoracentesis performed 8/26 - doubt that emptied half of what's noted on CT chest  Recommendations: -No amiodarone - cont lasix to neg balance further -some of what we see can be continued layering of semi therapeutic thora performed -may need repeat thora on rt to fully therapeutic tap, follow response steroids first -Sed rate 60, recent OR, with clinical status not progressing well, will agree solumedrol IV -may need repeat abg -if declines, would NOT offer NIMV , would place ETT and bronch -with this degree of infiltrates, a consideration for DNI STATUS is reasonable -dc STATIN, rare case reports pneumonitis -pct again in am , if rise, restart nosocomial abx, low threshold  Mcarthur Rossetti. Tyson Alias, MD, FACP Pgr: [phone] [facility]

## 2013-06-25 ENCOUNTER — Inpatient Hospital Stay (HOSPITAL_COMMUNITY): Payer: Medicare Other

## 2013-06-25 DIAGNOSIS — R5381 Other malaise: Secondary | ICD-10-CM

## 2013-06-25 LAB — GLUCOSE, CAPILLARY: Glucose-Capillary: 146 mg/dL — ABNORMAL HIGH (ref 70–99)

## 2013-06-25 LAB — CBC
HCT: 28.7 % — ABNORMAL LOW (ref 39.0–52.0)
Hemoglobin: 10.4 g/dL — ABNORMAL LOW (ref 13.0–17.0)
MCV: 87.8 fL (ref 78.0–100.0)
RBC: 3.27 MIL/uL — ABNORMAL LOW (ref 4.22–5.81)
RDW: 14.5 % (ref 11.5–15.5)
WBC: 11 10*3/uL — ABNORMAL HIGH (ref 4.0–10.5)

## 2013-06-25 LAB — COMPREHENSIVE METABOLIC PANEL
ALT: 43 U/L (ref 0–53)
Alkaline Phosphatase: 126 U/L — ABNORMAL HIGH (ref 39–117)
CO2: 28 mEq/L (ref 19–32)
Chloride: 89 mEq/L — ABNORMAL LOW (ref 96–112)
Glucose, Bld: 131 mg/dL — ABNORMAL HIGH (ref 70–99)
Potassium: 4.1 mEq/L (ref 3.5–5.1)
Sodium: 128 mEq/L — ABNORMAL LOW (ref 135–145)

## 2013-06-25 LAB — PROCALCITONIN: Procalcitonin: 0.51 ng/mL

## 2013-06-25 MED ORDER — ZOLPIDEM TARTRATE 5 MG PO TABS
5.0000 mg | ORAL_TABLET | Freq: Once | ORAL | Status: AC
  Administered 2013-06-25: 5 mg via ORAL
  Filled 2013-06-25: qty 1

## 2013-06-25 NOTE — Progress Notes (Addendum)
[facility] DAILY ICU PROGRESS NOTE                   [address]            [place] [address]          [phone]        Total Length of Stay:  LOS: 5 days   Subjective: Looks and feels a bit better, stronger. Up in chair currently  Objective: Vital signs in last 24 hours: Temp:  [97.2 F (36.2 C)-98 F (36.7 C)] 97.6 F (36.4 C) (08/29 0400) Pulse Rate:  [64-85] 64 (08/29 0600) Cardiac Rhythm:  [-] Normal sinus rhythm (08/29 0400) Resp:  [14-29] 18 (08/29 0600) BP: (98-143)/(37-60) 133/55 mmHg (08/29 0600) SpO2:  [85 %-98 %] 97 % (08/29 0600) FiO2 (%):  [50 %-100 %] 50 % (08/29 0400) Weight:  [151 lb 10.8 oz (68.8 kg)] 151 lb 10.8 oz (68.8 kg) (08/29 0500)  Filed Weights   06/23/13 0500 06/24/13 0500 06/25/13 0500  Weight: 157 lb 3 oz (71.3 kg) 156 lb 1.4 oz (70.8 kg) 151 lb 10.8 oz (68.8 kg)    Weight change: -4 lb 6.6 oz (-2 kg)   Hemodynamic parameters for last 24 hours:    Intake/Output from previous day: 08/28 0701 - 08/29 0700 In: 903 [P.O.:900; I.V.:3] Out: 4050 [Urine:4050]  Intake/Output this shift:    Current Meds: Scheduled Meds: . aspirin EC  325 mg Oral Daily  . darifenacin  7.5 mg Oral Daily  . doxazosin  2 mg Oral QHS  . feeding supplement  1 Container Oral TID WC  . ferrous fumarate  1 tablet Oral BID  . folic acid  1 mg Oral Daily  . furosemide  80 mg Intravenous Q12H  . heparin  5,000 Units Subcutaneous Q8H  . methylPREDNISolone (SOLU-MEDROL) injection  60 mg Intravenous Q6H  . metoprolol tartrate  12.5 mg Oral BID  . pantoprazole  40 mg Oral Daily  . potassium chloride  40 mEq Oral BID  . sodium chloride  3 mL Intravenous Q12H   Continuous Infusions:  PRN Meds:.sodium chloride, acetaminophen, ondansetron (ZOFRAN) IV, sodium chloride, traMADol  General appearance: alert, cooperative, fatigued and no distress Heart: regular rate and rhythm and occas extrasystole Lungs: crackles and dim in bases Abdomen: soft, mild  distension Extremities: no edema Wound: incis healing well  Lab Results: CBC: Recent Labs  06/24/13 0425 06/25/13 0438  WBC 14.0* 11.0*  HGB 10.3* 10.4*  HCT 28.2* 28.7*  PLT 282 278   BMET:  Recent Labs  06/24/13 0425 06/25/13 0438  NA 126* 128*  K 3.3* 4.1  CL 87* 89*  CO2 26 28  GLUCOSE 100* 131*  BUN 24* 33*  CREATININE 1.18 1.20  CALCIUM 8.4 8.5    PT/INR: No results found for this basename: LABPROT, INR,  in the last 72 hours Radiology: Dg Chest Port 1 View  06/24/2013   *RADIOLOGY REPORT*  Clinical Data: Follow up infiltrates.  Shortness of breath.  PORTABLE CHEST - 1 VIEW  Comparison: 06/23/2013  Findings: There has been mild progression of the bilateral airspace disease.  The patient has a prosthetic cardiac valve.  Heart size is upper limits normal.  Persistent consolidation or densities at the left lung base.  Negative for pneumothorax.  IMPRESSION: Progression of the bilateral airspace disease.  Findings are suggestive for pulmonary edema.  Persistent densities at the left lung base may represent pleural fluid and consolidation.  Original Report Authenticated By: Richarda Overlie, M.D.   Dg Chest Port 1 View  06/23/2013   CLINICAL DATA:  Shortness of breath, CHF.  EXAM: PORTABLE CHEST - 1 VIEW  COMPARISON:  06/22/2013  FINDINGS: Prior CABG and valve replacement. Improving but persistent pulmonary edema. Small bilateral pleural effusions. No acute bony abnormality.  IMPRESSION: Mild edema/ CHF with slight improvement since prior study. Small bilateral effusions.   Electronically Signed   By: Charlett Nose   On: 06/23/2013 08:09     Assessment/Plan:  1. He looks better today, dyspnea somewhat improved. 2 I/O neg 3500 yesterday 3 now on steroids, sugars controlled 4 sodium conts to improve renal fxn stable 5 push nutrition and rehab as able   GOLD,WAYNE E 06/25/2013 7:37 AM  I have seen and examined the patient and agree with the assessment and plan as  outlined.  Purcell Nails 06/25/2013 6:58 PM

## 2013-06-25 NOTE — Progress Notes (Signed)
Roderic Palau will follow up with pt's social supports on Monday to assist with planning most appropriate disposition. [phone]

## 2013-06-25 NOTE — Progress Notes (Signed)
eLink Physician-Brief Progress Note Patient Name: Demaris Leavell DOB: [birth year] MRN: [id number]  Date of Service  06/25/2013   HPI/Events of Note  Call from bedside nurse reporting that patient is confused and paranoid.  Not combative or agitated.  VSS with good sats on RA.  Camera check shows patient to not be in distress.  eICU Interventions  Plan: Check CBG QTc greater than 500 so hold on hadol Try Ambien 5 mg po times one for sleep Continue to monitor   Intervention Category Minor Interventions: Agitation / anxiety - evaluation and management  DETERDING,ELIZABETH 06/25/2013, 9:29 PM

## 2013-06-25 NOTE — Progress Notes (Signed)
Subjective: Pt sitting in chair More awake today  Objective: Vital signs in last 24 hours: Temp:  [97.2 F (36.2 C)-98 F (36.7 C)] 97.7 F (36.5 C) (08/29 0730) Pulse Rate:  [64-85] 64 (08/29 0600) Resp:  [14-29] 18 (08/29 0600) BP: (98-143)/(37-60) 133/55 mmHg (08/29 0600) SpO2:  [85 %-98 %] 97 % (08/29 0600) FiO2 (%):  [50 %-100 %] 50 % (08/29 0400) Weight:  [68.8 kg (151 lb 10.8 oz)] 68.8 kg (151 lb 10.8 oz) (08/29 0500) Weight change: -2 kg (-4 lb 6.6 oz) Last BM Date: 06/24/13  Intake/Output from previous day: 08/28 0701 - 08/29 0700 In: 903 [P.O.:900; I.V.:3] Out: 4450 [Urine:4450] Intake/Output this shift:    General appearance: alert Resp: rales bilaterally Cardio: regular rate and rhythm  Lab Results:  Recent Labs  06/24/13 0425 06/25/13 0438  WBC 14.0* 11.0*  HGB 10.3* 10.4*  HCT 28.2* 28.7*  PLT 282 278   BMET  Recent Labs  06/24/13 0425 06/25/13 0438  NA 126* 128*  K 3.3* 4.1  CL 87* 89*  CO2 26 28  GLUCOSE 100* 131*  BUN 24* 33*  CREATININE 1.18 1.20  CALCIUM 8.4 8.5    Studies/Results: Dg Chest Port 1 View  06/24/2013   *RADIOLOGY REPORT*  Clinical Data: Follow up infiltrates.  Shortness of breath.  PORTABLE CHEST - 1 VIEW  Comparison: 06/23/2013  Findings: There has been mild progression of the bilateral airspace disease.  The patient has a prosthetic cardiac valve.  Heart size is upper limits normal.  Persistent consolidation or densities at the left lung base.  Negative for pneumothorax.  IMPRESSION: Progression of the bilateral airspace disease.  Findings are suggestive for pulmonary edema.  Persistent densities at the left lung base may represent pleural fluid and consolidation.   Original Report Authenticated By: Richarda Overlie, M.D.   Dg Chest Port 1 View  06/23/2013   CLINICAL DATA:  Shortness of breath, CHF.  EXAM: PORTABLE CHEST - 1 VIEW  COMPARISON:  06/22/2013  FINDINGS: Prior CABG and valve replacement. Improving but persistent  pulmonary edema. Small bilateral pleural effusions. No acute bony abnormality.  IMPRESSION: Mild edema/ CHF with slight improvement since prior study. Small bilateral effusions.   Electronically Signed   By: Charlett Nose   On: 06/23/2013 08:09    Medications: I have reviewed the patient's current medications.  Assessment/Plan: Respiratory failure- pleural effusion- s/p right drained- favour toward Pnumonitis- continue steroid and continue lasix  Leukocytosis better NSR; Echo EF is ok  Anemia- better after transfusion  Hyponatremia-  Improved; combination of underlying Pulmonary process- CHF/ SIADH/ [place] - Continue Fluid restriction  Poor nutritional and Decondionting- PT and nutritional supplement  Agree with CIR- inpatient rehab.   LOS: 5 days   Custer Pimenta 06/25/2013, 7:49 AM

## 2013-06-25 NOTE — Progress Notes (Signed)
Pt c/o "hearing airplane sounds/roaring" in ears intermittently, for 2 sec intervals; family concerned; emotional support given;  Paul Half, RN

## 2013-06-25 NOTE — Consult Note (Signed)
Physical Medicine and Rehabilitation Consult   Reason for Consult: Deconditioning Referring Physician:  Dr. Dione Housekeeper.    HPI: Joseph Garner is a 79 y.o. male with a history of HTN, GERD CAD and severe AS, s/p AVR, 1 vessel CABG and RLL wedge resection of lung for lesion on 8/8 by Dr. Tyrone Sage. Admitted from SNF on 8/24 and felt to be in acute diastolic CHF with O2 sats 89%. CT chest with bilateral effusions and diffuse air space disease.  Patient underwent right thoracocentesis on 06/22/13 and lasix held due to severe hyponatremia and hypotension. 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  Diuresis and empiric antibiotics continued for treatment and steroids added to help with symptoms. PT evaluation done yesterday and patient noted to be deconditioned. MD, PT recommending CIR.   Review of Systems  Constitutional: Positive for malaise/fatigue.  HENT: Positive for hearing loss.   Respiratory: Positive for cough and shortness of breath.   Cardiovascular: Negative for chest pain and palpitations.  Psychiatric/Behavioral: The patient is nervous/anxious.    Past Medical History  Diagnosis Date  . BPH (benign prostatic hypertrophy)   . Diverticulosis 2006  . Depression   . Anemia   . Spinal stenosis   . Lung nodules 2010  . Actinic keratosis, hx of   . Macular degeneration   . Hypertension   . Allergy     allergic rhinitis  . Hearing loss     wears bilateral hearing aids  . Inguinal hernia     left side -surgery planned  . GERD (gastroesophageal reflux disease)   . Arthritis     generalized-more back issues  . Severe aortic stenosis 04/16/2013  . Nodule of right lung 05/24/2013  . CHF (congestive heart failure)   . Shortness of breath   . Atrial flutter    Past Surgical History  Procedure Laterality Date  . Tonsillectomy  1933  . Appendectomy  1947  . Neck surgery   1997    compressed nerve  . Skin biopsy  2003-2011    multiple   . Cataract extraction, bilateral      bilateral  . Inguinal hernia repair Left 02/11/2013    Procedure: LEFT LAPAROSCOPIC REPAIR INGUINAL HERNIA WITH MESH;  Surgeon: Ernestene Mention, MD;  Location: [facility];  Service: General;  Laterality: Left;  . Insertion of mesh Left 02/11/2013    Procedure: INSERTION OF MESH;  Surgeon: Ernestene Mention, MD;  Location: [facility];  Service: General;  Laterality: Left;  . Hernia repair  02/24/13    lap LIH repair  . Aortic valve replacement N/A 06/04/2013    Procedure: AORTIC VALVE REPLACEMENT (AVR);  Surgeon: Delight Ovens, MD;  Location: [facility] OR;  Service: Open Heart Surgery;  Laterality: N/A;  . Intraoperative transesophageal echocardiogram N/A 06/04/2013    Procedure: INTRAOPERATIVE TRANSESOPHAGEAL ECHOCARDIOGRAM;  Surgeon: Delight Ovens, MD;  Location: [facility] OR;  Service: Open Heart Surgery;  Laterality: N/A;  . Coronary artery bypass graft N/A 06/04/2013    Procedure: CORONARY ARTERY BYPASS GRAFTING (CABG);  Surgeon: Delight Ovens, MD;  Location: [facility] OR;  Service: Open Heart Surgery;  Laterality: N/A;  . Wedge resection Right 06/04/2013    Procedure: WEDGE RESECTION Right Lower Lobe;  Surgeon: Delight Ovens, MD;  Location: [facility] OR;  Service: Open Heart Surgery;  Laterality: Right;   Family History  Problem Relation Age of  Onset  . Prostate cancer Father   . Cancer Mother     passed away age 16   Social History: Married. Independent and active prior to 4/14. Has had decline in mobility for the past few months. He reports that he quit smoking about 45 years ago. His smoking use included Cigarettes. He has a 30 pack-year smoking history. He has never used smokeless tobacco. He reports that  drinks alcohol. He reports that he does not use illicit drugs.  Allergies: No Known Allergies  Medications Prior to Admission  Medication Sig Dispense Refill  . amiodarone (PACERONE) 200 MG tablet  Take 1 tablet (200 mg total) by mouth 2 (two) times daily.      Marland Kitchen aspirin EC 325 MG EC tablet Take 1 tablet (325 mg total) by mouth daily.  30 tablet  0  . atorvastatin (LIPITOR) 20 MG tablet Take 1 tablet (20 mg total) by mouth daily at 6 PM.      . doxazosin (CARDURA) 4 MG tablet Take 4 mg by mouth at bedtime.      . ferrous fumarate (HEMOCYTE - 106 MG FE) 325 (106 FE) MG TABS tablet Take 1 tablet (106 mg of iron total) by mouth 2 (two) times daily.  30 each  0  . folic acid (FOLVITE) 1 MG tablet Take 1 tablet (1 mg total) by mouth daily.      . furosemide (LASIX) 40 MG tablet Take 1 tablet (40 mg total) by mouth daily.  30 tablet    . metoprolol tartrate (LOPRESSOR) 25 MG tablet Take 12.5 mg by mouth 2 (two) times daily.      . Multiple Vitamin (MULTIVITAMIN WITH MINERALS) TABS Take 1 tablet by mouth daily.      . Multiple Vitamins-Minerals (PRESERVISION AREDS PO) Take 2 capsules by mouth daily.       Marland Kitchen omeprazole (PRILOSEC OTC) 20 MG tablet Take 20 mg by mouth daily.      . potassium chloride SA (K-DUR,KLOR-CON) 20 MEQ tablet Take 1 tablet (20 mEq total) by mouth daily.      . solifenacin (VESICARE) 5 MG tablet Take 5 mg by mouth daily.      . fluticasone (FLONASE) 50 MCG/ACT nasal spray Place 2 sprays into the nose daily as needed for allergies.       Marland Kitchen loratadine (CLARITIN) 10 MG tablet Take 10 mg by mouth daily as needed for allergies.       Marland Kitchen traMADol (ULTRAM) 50 MG tablet Take 1 tablet (50 mg total) by mouth every 6 (six) hours as needed.  120 tablet  5    Home: Home Living Family/patient expects to be discharged to:: Inpatient rehab Living Arrangements: Spouse/significant other Available Help at Discharge: Family;Available 24 hours/day Type of Home: House Home Access: Stairs to enter [employer] of Steps: 6 Entrance Stairs-Rails: Left Home Layout: One level Home Equipment: None;Other (comment)  Functional History: Prior Function Comments: was independent and  walking any distance until his hernia surgery in April of 2014 since then he has limited ambulation which got significantly worse over the past week.   Functional Status:  Mobility: Bed Mobility Bed Mobility: Not assessed Sit to Supine: 3: Mod assist Transfers Transfers: Sit to Stand;Stand to Sit Sit to Stand: 3: Mod assist;From chair/3-in-1 Sit to Stand: Patient Percentage: 60% Stand to Sit: 4: Min assist;To chair/3-in-1 Stand to Sit: Patient Percentage: 60% Stand Pivot Transfers: 1: +2 Total assist Stand Pivot Transfers: Patient Percentage: 60% Ambulation/Gait Ambulation/Gait Assistance: 4:  Min assist Ambulation Distance (Feet): 30 Feet (15' x 2 with seated rest break) Assistive device: Rolling walker Ambulation/Gait Assistance Details: (A) to manage RW with cues for correct placement.  Pt needed seated rest break due to overall fatigue and decrease SaO2 with ambulation Gait Pattern: Step-through pattern;Decreased stride length Gait velocity: decreased General Gait Details: very slow gait    ADL:    Cognition: Cognition Overall Cognitive Status: Within Functional Limits for tasks assessed Orientation Level: Oriented X4 Cognition Arousal/Alertness: Awake/alert Behavior During Therapy: WFL for tasks assessed/performed Overall Cognitive Status: Within Functional Limits for tasks assessed  Blood pressure 124/50, pulse 65, temperature 97.7 F (36.5 C), temperature source Oral, resp. rate 23, height 5\' 8"  (1.727 m), weight 68.8 kg (151 lb 10.8 oz), SpO2 94.00%.   Physical Exam  Nursing note and vitals reviewed. Constitutional:  Frail elderly male, pulling on face mask and complaining of "sound of wind" in his ears.   HENT:  Head: Normocephalic and atraumatic.  Eyes: Conjunctivae are normal. Pupils are equal, round, and reactive to light.  Neck: Normal range of motion. Neck supple.  Cardiovascular: Normal rate.   Pulmonary/Chest: Effort normal. No respiratory distress. He  has decreased breath sounds. He has wheezes in the right upper field and the left upper field. He has rales in the left lower field. He exhibits no tenderness.  Abdominal: Soft. Bowel sounds are normal. He exhibits no distension. There is no tenderness.  Musculoskeletal: He exhibits no edema and no tenderness.  Neurological: He is alert.  HOH. Distracted and fidgeting. Needed redirection but able to follow simple commands. Strength 3+ to 4/5 in all 4. No gross sensory deficits. Fair insight and awareness  Skin: Skin is warm and dry.  Psychiatric: He has a normal mood and affect. His behavior is normal.    Results for orders placed during the hospital encounter of 06/20/13 (from the past 24 hour(s))  COMPREHENSIVE METABOLIC PANEL     Status: Abnormal   Collection Time    06/25/13  4:38 AM      Result Value Range   Sodium 128 (*) 135 - 145 mEq/L   Potassium 4.1  3.5 - 5.1 mEq/L   Chloride 89 (*) 96 - 112 mEq/L   CO2 28  19 - 32 mEq/L   Glucose, Bld 131 (*) 70 - 99 mg/dL   BUN 33 (*) 6 - 23 mg/dL   Creatinine, Ser 7.82  0.50 - 1.35 mg/dL   Calcium 8.5  8.4 - 95.6 mg/dL   Total Protein 6.1  6.0 - 8.3 g/dL   Albumin 2.3 (*) 3.5 - 5.2 g/dL   AST 55 (*) 0 - 37 U/L   ALT 43  0 - 53 U/L   Alkaline Phosphatase 126 (*) 39 - 117 U/L   Total Bilirubin 0.8  0.3 - 1.2 mg/dL   GFR calc non Af Amer 53 (*) >90 mL/min   GFR calc Af Amer 61 (*) >90 mL/min  PROCALCITONIN     Status: None   Collection Time    06/25/13  4:38 AM      Result Value Range   Procalcitonin 0.51    CBC     Status: Abnormal   Collection Time    06/25/13  4:38 AM      Result Value Range   WBC 11.0 (*) 4.0 - 10.5 K/uL   RBC 3.27 (*) 4.22 - 5.81 MIL/uL   Hemoglobin 10.4 (*) 13.0 - 17.0 g/dL   HCT 21.3 (*)  39.0 - 52.0 %   MCV 87.8  78.0 - 100.0 fL   MCH 31.8  26.0 - 34.0 pg   MCHC 36.2 (*) 30.0 - 36.0 g/dL   RDW 16.1  09.6 - 04.5 %   Platelets 278  150 - 400 K/uL   Dg Chest Port 1 View  06/24/2013   *RADIOLOGY REPORT*   Clinical Data: Follow up infiltrates.  Shortness of breath.  PORTABLE CHEST - 1 VIEW  Comparison: 06/23/2013  Findings: There has been mild progression of the bilateral airspace disease.  The patient has a prosthetic cardiac valve.  Heart size is upper limits normal.  Persistent consolidation or densities at the left lung base.  Negative for pneumothorax.  IMPRESSION: Progression of the bilateral airspace disease.  Findings are suggestive for pulmonary edema.  Persistent densities at the left lung base may represent pleural fluid and consolidation.   Original Report Authenticated By: Richarda Overlie, M.D.    Assessment/Plan: Diagnosis: Deconditioning after multiple medical/surgical issues as above 1. Does the need for close, 24 hr/day medical supervision in concert with the patient's rehab needs make it unreasonable for this patient to be served in a less intensive setting? No/potentially 2. Co-Morbidities requiring supervision/potential complications: CAD s/p CABG, AVR, pneumonia 3. Due to bladder management, bowel management, safety, skin/wound care, disease management, medication administration, pain management and patient education, does the patient require 24 hr/day rehab nursing? Potentially 4. Does the patient require coordinated care of a physician, rehab nurse, PT (1-2 hrs/day, 5 days/week) and OT (1-2 hrs/day, 5 days/week) to address physical and functional deficits in the context of the above medical diagnosis(es)? Potentially Addressing deficits in the following areas: balance, endurance, locomotion, strength, transferring, bowel/bladder control, bathing, dressing, feeding, grooming, toileting and psychosocial support 5. Can the patient actively participate in an intensive therapy program of at least 3 hrs of therapy per day at least 5 days per week? Yes 6. The potential for patient to make measurable gains while on inpatient rehab is good 7. Anticipated functional outcomes upon discharge from  inpatient rehab are supervision with PT, supervision to min assist with OT, n/a with SLP. 8. Estimated rehab length of stay to reach the above functional goals is: ?1-2 weeks 9. Does the patient have adequate social supports to accommodate these discharge functional goals? No and Potentially 10. Anticipated D/C setting: Home 11. Anticipated post D/C treatments: HH therapy 12. Overall Rehab/Functional Prognosis: good  RECOMMENDATIONS: This patient's condition is appropriate for continued rehabilitative care in the following setting: SNF (vs CIR--see below_ Patient has agreed to participate in recommended program. Potentially Note that insurance prior authorization may be required for reimbursement for recommended care.  Comment: I'm not clear as to why inpatient rehab is being considered now when it wasn't before.  From the pt's description, his wife is unable to provide substantial assistance once home. ?perhaps his daughter can?  If we were to bring him to inpatient rehab, someone would need to be at home to provide supervision. Will follow along for progress.    Ranelle Oyster, MD, [facility] Physical Medicine & Rehabilitation     06/25/2013

## 2013-06-25 NOTE — Consult Note (Signed)
PULMONARY  / CRITICAL CARE MEDICINE  Name: Joseph Garner MRN: [id number] DOB: [birth year]    ADMISSION DATE:  06/20/2013 CONSULTATION DATE:  8/27  REFERRING MD :  Anne Fu PRIMARY SERVICE:  [place]  CHIEF COMPLAINT/reason for consult: hypoxia and pulmonary infiltrates   BRIEF PATIENT DESCRIPTION:  79yo WM with a history of HTN, GERD CAD and severe AS, s/p AVR, 1 vessel CABG and RLL wedge resection of lung for lesion on 8/8 by Dr. Tyrone Sage. Admitted on 8/24 and felt to be  in acute diastolic CHF with O2 sats 89%. PCCM asked to see on 8/27 for persistent hypoxia and pulmonary infiltrates in spite of diuretics.    SIGNIFICANT EVENTS / STUDIES:  ECHO 8/25: nml LV size, EF 65-70%, gd 2 diastolic dysfxn, mild PAH (PAS 39), Aortic Valve prosthesis w/ nml ROM CT chest 8/25: bilateral effusions, bilateral diffuse airspace disease.  Right thoracentesis 8/26: 500 ml No samples sent   LINES / TUBES:  CULTURES: bcx2 8/24>>>> Urine strep and legionella antigens 8/24: both neg   ANTIBIOTICS: Cefepime 8/24>>> vanc 8/24>>>  SUBJECTIVE:  Remains on 100% NRB, much stronger appearing NEg 3.1 liters!!  VITAL SIGNS: Temp:  [97.2 F (36.2 C)-98 F (36.7 C)] 97.7 F (36.5 C) (08/29 0730) Pulse Rate:  [64-79] 65 (08/29 0700) Resp:  [14-29] 23 (08/29 0700) BP: (103-143)/(37-60) 124/50 mmHg (08/29 0700) SpO2:  [85 %-98 %] 94 % (08/29 0700) FiO2 (%):  [50 %-60 %] 50 % (08/29 0400) Weight:  [68.8 kg (151 lb 10.8 oz)] 68.8 kg (151 lb 10.8 oz) (08/29 0500)  PHYSICAL EXAMINATION: General:  Stronger in chair Neuro:  Awake, HOH, moves all ext  HEENT:  [place], no JVD  Cardiovascular: rrr + click  Lungs:  Crackles diffuse improved Abdomen:  Soft, no OM  Musculoskeletal:  Intact  Skin:  Dry, + LE edema    Recent Labs Lab 06/23/13 0420 06/24/13 0425 06/25/13 0438  NA 123* 126* 128*  K 4.0 3.3* 4.1  CL 86* 87* 89*  CO2 26 26 28   BUN 17 24* 33*  CREATININE 1.06 1.18 1.20  GLUCOSE 108* 100* 131*     Recent Labs Lab 06/23/13 0420 06/24/13 0425 06/25/13 0438  HGB 8.4* 10.3* 10.4*  HCT 22.9* 28.2* 28.7*  WBC 15.4* 14.0* 11.0*  PLT 295 282 278   PCXR Bilateral interstitial airspace disease/edema increased even given penetration difference from day prior ASSESSMENT / PLAN:  Acute hypoxic respiratory failure in setting of diffuse pulmonary infiltrates of unclear etiology.  Certainly think some degree of this is/was edema/ effusion in setting of decompensated diastolic dysfxn, agree HCAP or aspiration (less likely)  As is drug related pneumonitis (amiodarone). Favor mix of edema and pneumonitis. Thoracentesis performed 8/26 - doubt that emptied half of what's noted on CT chest  Recommendations: -No amiodarone - cont lasix to neg balance further, he did exceptionally well with this, maintain neg 2 liters as goal -may need repeat thora on rt to fully therapeutic tap, will re eval pcxr today -Sed rate 60, steroids started, repeat in am  -if declines, would NOT offer NIMV , would place ETT and bronch -No STATIN, rare case reports pneumonitis -pct does not support infection, no fevers -would keep steroids same dose and re eval pcxr in am  -I spoke to wife and daughter updated -re attempt to 50%, may be a candidate for high flow cannula with reservoir -Will follow in am, hopefull to get to 50%   Mcarthur Rossetti. Tyson Alias, MD, FACP Pgr:  [phone] [facility]

## 2013-06-25 NOTE — Progress Notes (Signed)
Subjective:  Sitting on the bedside commode. Bowel movement. Still short of breath but feeling better.  Objective:  Vital Signs in the last 24 hours: Temp:  [97.2 F (36.2 C)-98 F (36.7 C)] 97.7 F (36.5 C) (08/29 0730) Pulse Rate:  [64-79] 65 (08/29 0700) Resp:  [14-29] 23 (08/29 0700) BP: (98-143)/(37-60) 124/50 mmHg (08/29 0700) SpO2:  [85 %-98 %] 94 % (08/29 0700) FiO2 (%):  [50 %-60 %] 50 % (08/29 0400) Weight:  [68.8 kg (151 lb 10.8 oz)] 68.8 kg (151 lb 10.8 oz) (08/29 0500)  Intake/Output from previous day: 08/28 0701 - 08/29 0700 In: 1143 [P.O.:1140; I.V.:3] Out: 4450 [Urine:4450]   Physical Exam: General: Elderly, sitting, in minimal distress.  Head: Normocephalic and atraumatic. Face mask oxygen  Lungs: Improved breath sounds right base, decreased breath sounds left base, crackles scattered heard throughout lung fields  Heart: Normal S1 and S2. Soft systolic murmur, no rubs or gallops.  Abdomen: soft, non-tender, positive bowel sounds.  Extremities: No clubbing or cyanosis. No edema. Previously 2+ lower extremity  Neurologic: Alert and oriented x 3.     Lab Results:  Recent Labs  06/24/13 0425 06/25/13 0438  WBC 14.0* 11.0*  HGB 10.3* 10.4*  PLT 282 278    Recent Labs  06/24/13 0425 06/25/13 0438  NA 126* 128*  K 3.3* 4.1  CL 87* 89*  CO2 26 28  GLUCOSE 100* 131*  BUN 24* 33*  CREATININE 1.18 1.20   Recent Labs  06/25/13 0438  PROT 6.1  ALBUMIN 2.3*  AST 55*  ALT 43  ALKPHOS 126*  BILITOT 0.8    Imaging: Dg Chest Port 1 View  06/24/2013   *RADIOLOGY REPORT*  Clinical Data: Follow up infiltrates.  Shortness of breath.  PORTABLE CHEST - 1 VIEW  Comparison: 06/23/2013  Findings: There has been mild progression of the bilateral airspace disease.  The patient has a prosthetic cardiac valve.  Heart size is upper limits normal.  Persistent consolidation or densities at the left lung base.  Negative for pneumothorax.  IMPRESSION: Progression of  the bilateral airspace disease.  Findings are suggestive for pulmonary edema.  Persistent densities at the left lung base may represent pleural fluid and consolidation.   Original Report Authenticated By: Richarda Overlie, M.D.   Personally viewed.   Telemetry: Sinus rhythm, first degree AV block Personally viewed.    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    1. Acute respiratory failure  - Appreciate CCM, pulmonary.  - Possible pneumonitis, steroids, discontinuation of amiodarone.  - Off antibiotics. Leukocytosis improved despite steroids.  - No fevers.  - Continues with facemask oxygen however.  - We will continue with aggressive IV Lasix, diuresis. Creatinine currently 1.2.  2. acute diastolic heart failure  - Likely playing a role  - IV diuresis, total -8.8 L  3. prior atrial flutter/SVT  - Earlier on this admission, discontinued amiodarone  -  Maintaining sinus rhythm with first-degree AV block. Monitor.  4. Deconditioning  - Appreciate rehabilitation consultation. He would be a candidate for inpatient rehabilitation here. Excellent. I would like to continue with aggressive diuresis today is inpatient, reevaluate tomorrow.  5. Hyponatremia  - Improved, Lasix  6. Anemia  - Stable post transfusion.  DVT prophylaxis as well as GI  prophylaxis ordered.    Fonda Rochon 06/25/2013, 11:46 AM

## 2013-06-25 NOTE — Progress Notes (Signed)
Physical Therapy Treatment Patient Details Name: Joseph Garner MRN: [id number] DOB: 1924-10-12 Today's Date: 06/25/2013 Time: 3086-5784 PT Time Calculation (min): 23 min  PT Assessment / Plan / Recommendation  History of Present Illness 79yo WM with a history of HTN, GERD CAD and severe AS, s/p AVR, 1 vessel CABG and RLL wedge resection of lung for lesion on 8/8 by Dr. Tyrone Sage. Admitted on 8/24 and felt to be  in acute diastolic CHF with O2 sats 89%. PCCM asked to see on 8/27 for persistent hypoxia and pulmonary infiltrates in spite of diuretics.      PT Comments   Pt able to increase ambulation distance and perform HEP.  Spoke with Cardiac Rehab about pt wanting to start Cardiac Rehab again.  Cardiac Rehab awaiting an order from MD when appropriate.  Continue to recommend inpatient rehab to increase pt's overall independence for d/c home.   Follow Up Recommendations  CIR     Equipment Recommendations  None recommended by PT    Frequency Min 3X/week   Progress towards PT Goals Progress towards PT goals: Progressing toward goals  Plan Current plan remains appropriate    Precautions / Restrictions Precautions Precautions: Fall;Sternal Precaution Comments: Monitor HR, DOE, and O2 sats with mobility; currently on partial Non-rebreather Restrictions Weight Bearing Restrictions: No   Pertinent Vitals/Pain No c/o pain; FiO2 28% with destats to 87% with ambulation and needs standing rest break to increase to low 90s.    Mobility  Bed Mobility Bed Mobility: Not assessed Details for Bed Mobility Assistance: Pt in recliner when entering room. Transfers Transfers: Sit to Stand;Stand to Sit Sit to Stand: 3: Mod assist;From chair/3-in-1 Stand to Sit: 4: Min assist;To chair/3-in-1 Details for Transfer Assistance: +2 (A) to maintain balance and due to sternal precautions needs extra assistance Ambulation/Gait Ambulation/Gait Assistance: 4: Min assist Ambulation Distance (Feet): 30 Feet  (15' x 2 with seated rest break) Assistive device: Rolling walker Ambulation/Gait Assistance Details: (A) to manage RW with cues for correct placement.  Pt needed seated rest break due to overall fatigue and decrease SaO2 with ambulation Gait Pattern: Step-through pattern;Decreased stride length Gait velocity: decreased General Gait Details: very slow gait    Exercises General Exercises - Lower Extremity Long Arc Quad: Strengthening;Both;10 reps Straight Leg Raises: Strengthening;Both;10 reps Hip Flexion/Marching: Strengthening;10 reps;Both   PT Diagnosis:    PT Problem List:   PT Treatment Interventions:     PT Goals (current goals can now be found in the care plan section) Acute Rehab PT Goals Patient Stated Goal: to get back to independent lifestyle PT Goal Formulation: With patient Time For Goal Achievement: 06/21/13 Potential to Achieve Goals: Good  Visit Information  Last PT Received On: 07/02/13 Assistance Needed: +1 History of Present Illness: 79yo WM with a history of HTN, GERD CAD and severe AS, s/p AVR, 1 vessel CABG and RLL wedge resection of lung for lesion on 8/8 by Dr. Tyrone Sage. Admitted on 8/24 and felt to be  in acute diastolic CHF with O2 sats 89%. PCCM asked to see on 8/27 for persistent hypoxia and pulmonary infiltrates in spite of diuretics.       Subjective Data  Subjective: Did I do good today? Patient Stated Goal: to get back to independent lifestyle   Cognition  Cognition Arousal/Alertness: Awake/alert Behavior During Therapy: WFL for tasks assessed/performed Overall Cognitive Status: Within Functional Limits for tasks assessed    Balance  Dynamic Standing Balance Dynamic Standing - Balance Support: Bilateral upper extremity supported Dynamic  Standing - Level of Assistance: 1: +2 Total assist;Patient percentage (comment) Dynamic Standing - Balance Activities: Forward lean/weight shifting;Lateral lean/weight shifting;Other (comment)  End of Session  PT - End of Session Equipment Utilized During Treatment: Gait belt Activity Tolerance: Patient limited by fatigue Patient left: in chair;with call bell/phone within reach Nurse Communication: Mobility status;Precautions   GP     Yohana Bartha 06/25/2013, 11:26 AM   [name], PT DPT [phone]

## 2013-06-26 ENCOUNTER — Inpatient Hospital Stay (HOSPITAL_COMMUNITY): Payer: Medicare Other

## 2013-06-26 DIAGNOSIS — I4892 Unspecified atrial flutter: Secondary | ICD-10-CM

## 2013-06-26 LAB — BASIC METABOLIC PANEL
BUN: 46 mg/dL — ABNORMAL HIGH (ref 6–23)
CO2: 30 mEq/L (ref 19–32)
Calcium: 8.7 mg/dL (ref 8.4–10.5)
Creatinine, Ser: 1.37 mg/dL — ABNORMAL HIGH (ref 0.50–1.35)
Glucose, Bld: 138 mg/dL — ABNORMAL HIGH (ref 70–99)

## 2013-06-26 MED ORDER — MAGNESIUM HYDROXIDE 400 MG/5ML PO SUSP
15.0000 mL | Freq: Every day | ORAL | Status: DC | PRN
  Administered 2013-06-27: 15 mL via ORAL
  Filled 2013-06-26 (×2): qty 30

## 2013-06-26 MED ORDER — METHYLPREDNISOLONE SODIUM SUCC 125 MG IJ SOLR
60.0000 mg | Freq: Two times a day (BID) | INTRAMUSCULAR | Status: DC
  Administered 2013-06-26 – 2013-06-29 (×6): 60 mg via INTRAVENOUS
  Filled 2013-06-26 (×6): qty 0.96

## 2013-06-26 MED ORDER — BIOTENE DRY MOUTH MT LIQD
15.0000 mL | Freq: Two times a day (BID) | OROMUCOSAL | Status: DC
  Administered 2013-06-26 – 2013-07-02 (×13): 15 mL via OROMUCOSAL

## 2013-06-26 MED ORDER — FUROSEMIDE 40 MG PO TABS
60.0000 mg | ORAL_TABLET | Freq: Every day | ORAL | Status: DC
  Administered 2013-06-27 – 2013-06-30 (×4): 60 mg via ORAL
  Filled 2013-06-26 (×6): qty 1

## 2013-06-26 MED ORDER — FUROSEMIDE 40 MG PO TABS
60.0000 mg | ORAL_TABLET | Freq: Two times a day (BID) | ORAL | Status: DC
  Filled 2013-06-26 (×2): qty 1

## 2013-06-26 NOTE — Progress Notes (Signed)
Subjective: Patient alert sitting up, very pleasant  Objective: Vital signs in last 24 hours: Temp:  [97.3 F (36.3 C)-97.9 F (36.6 C)] 97.9 F (36.6 C) (08/30 0000) Pulse Rate:  [58-78] 65 (08/30 0500) Resp:  [14-29] 19 (08/30 0500) BP: (104-158)/(41-86) 125/57 mmHg (08/30 0500) SpO2:  [85 %-100 %] 89 % (08/30 0500) Weight change:  Last BM Date: 06/24/13  Intake/Output from previous day: 08/29 0701 - 08/30 0700 In: 240 [P.O.:240] Out: 1750 [Urine:1750] Intake/Output this shift: Total I/O In: -  Out: 650 [Urine:650]  Resp: clear to auscultation bilaterally Cardio: systolic murmur: systolic ejection 1/6, crescendo at 2nd right intercostal space Neurologic: Mental status: Alert, oriented, thought content appropriate, patient is alert but confused re location and current situation, no agitation  Lab Results:  Recent Labs  06/24/13 0425 06/25/13 0438  WBC 14.0* 11.0*  HGB 10.3* 10.4*  HCT 28.2* 28.7*  PLT 282 278   BMET  Recent Labs  06/24/13 0425 06/25/13 0438  NA 126* 128*  K 3.3* 4.1  CL 87* 89*  CO2 26 28  GLUCOSE 100* 131*  BUN 24* 33*  CREATININE 1.18 1.20  CALCIUM 8.4 8.5    Studies/Results: Dg Chest Port 1 View  06/26/2013   *RADIOLOGY REPORT*  Clinical Data: Evaluate edema  PORTABLE CHEST - 1 VIEW  Comparison: Prior radiograph from 06/25/2013  Findings: Median sternotomy wires remain intact.  Prosthetic mitral valve is again noted.  Cardiomegaly is not significantly changed.  Patchy left lower lobe opacity persists, which likely reflects a combination of atelectasis, pleural effusion, and edema. Interstitial edema has slightly worsened as compared to the prior exam with increased perihilar vascular congestion and peribronchial cuffing.  No pneumothorax.  Osseous structures are unchanged.  IMPRESSION: Interval worsening of interstitial pulmonary edema as compared to 06/25/2013.   Original Report Authenticated By: Rise Mu, M.D.   Dg Chest  Port 1 View  06/25/2013   *RADIOLOGY REPORT*  Clinical Data: Evaluate edema  PORTABLE CHEST - 1 VIEW  Comparison: 06/24/2013  Findings: Mild interstitial edema, improved.  Patchy left lower lobe opacity, likely a combination atelectasis and small pleural effusion.  No pneumothorax.  Cardiomegaly. Postsurgical changes related to prior CABG. Prosthetic valve.  IMPRESSION: Mild interstitial edema, improved.  Suspected small left pleural effusion.  Associated left lower lobe opacity, likely atelectasis.   Original Report Authenticated By: Charline Bills, M.D.    Medications:  Scheduled: . antiseptic oral rinse  15 mL Mouth Rinse BID  . aspirin EC  325 mg Oral Daily  . darifenacin  7.5 mg Oral Daily  . doxazosin  2 mg Oral QHS  . feeding supplement  1 Container Oral TID WC  . ferrous fumarate  1 tablet Oral BID  . folic acid  1 mg Oral Daily  . furosemide  80 mg Intravenous Q12H  . heparin  5,000 Units Subcutaneous Q8H  . methylPREDNISolone (SOLU-MEDROL) injection  60 mg Intravenous Q6H  . metoprolol tartrate  12.5 mg Oral BID  . pantoprazole  40 mg Oral Daily  . sodium chloride  3 mL Intravenous Q12H    Assessment/Plan: 1.Delirium, patient now has developed delirium secondary to acute illness likely will take weeks to totally clear, will help when he is able to come out of unit  2. Hyponatremia sodium pending this am 3.pulmonary doing much better now saturation 97% on 6 liters while sitting up appreciate cardiology and ccm management   LOS: 6 days   [facility] 06/26/2013, 6:34 AM

## 2013-06-26 NOTE — Progress Notes (Addendum)
[address]       [name] [address]             [phone]               Subjective: "I believe I feel a little bit better today."  States he ate more for breakfast.  Wants to move out of unit.   Objective: Vital signs in last 24 hours: Patient Vitals for the past 24 hrs:  BP Temp Temp src Pulse Resp SpO2 Weight  06/26/13 0800 112/72 mmHg 98.1 F (36.7 C) Oral 64 25 94 % -  06/26/13 0700 141/48 mmHg - - 71 24 93 % -  06/26/13 0600 147/49 mmHg - - 68 18 88 % -  06/26/13 0500 125/57 mmHg - - 65 19 89 % 150 lb 5.7 oz (68.2 kg)  06/26/13 0430 - - - 69 17 90 % -  06/26/13 0400 128/55 mmHg - - 66 20 86 % -  06/26/13 0300 130/52 mmHg - - 62 15 93 % -  06/26/13 0200 133/55 mmHg - - 67 22 85 % -  06/26/13 0100 133/46 mmHg - - 59 29 93 % -  06/26/13 0000 130/49 mmHg 97.9 F (36.6 C) Oral 60 14 92 % -  06/25/13 2300 125/55 mmHg - - 62 16 93 % -  06/25/13 2200 139/46 mmHg - - 73 28 93 % -  06/25/13 2100 158/70 mmHg - - 78 18 89 % -  06/25/13 2000 132/59 mmHg 97.7 F (36.5 C) Oral 67 22 93 % -  06/25/13 1940 - - - 71 16 88 % -  06/25/13 1900 131/57 mmHg - - 72 23 92 % -  06/25/13 1800 132/50 mmHg - - 68 17 92 % -  06/25/13 1700 116/48 mmHg - - 62 15 90 % -  06/25/13 1600 112/52 mmHg 97.3 F (36.3 C) Oral 64 20 92 % -  06/25/13 1500 104/48 mmHg - - 58 19 95 % -  06/25/13 1400 110/44 mmHg - - 60 15 92 % -  06/25/13 1300 150/68 mmHg - - 61 14 100 % -  06/25/13 1200 113/52 mmHg 97.5 F (36.4 C) Oral 58 18 96 % -  06/25/13 1100 121/49 mmHg - - 61 26 98 % -  06/25/13 1000 104/41 mmHg - - 68 18 94 % -   Current Weight  06/26/13 150 lb 5.7 oz (68.2 kg)     Intake/Output from previous day: 08/29 0701 - 08/30 0700 In: 240 [P.O.:240] Out: 1900 [Urine:1900]    PHYSICAL EXAM:  Heart: RRR Lungs: Scattered crackles bilaterally Wound: Healing well Extremities: Mild LE edema    Lab Results: CBC: Recent Labs  06/24/13 0425 06/25/13 0438  WBC  14.0* 11.0*  HGB 10.3* 10.4*  HCT 28.2* 28.7*  PLT 282 278   BMET:  Recent Labs  06/24/13 0425 06/25/13 0438  NA 126* 128*  K 3.3* 4.1  CL 87* 89*  CO2 26 28  GLUCOSE 100* 131*  BUN 24* 33*  CREATININE 1.18 1.20  CALCIUM 8.4 8.5    PT/INR: No results found for this basename: LABPROT, INR,  in the last 72 hours  CXR: Findings: Median sternotomy wires remain intact. Prosthetic mitral valve is again noted. Cardiomegaly is not significantly changed. Patchy left lower lobe opacity persists, which likely reflects a combination of atelectasis, pleural effusion, and edema.  Interstitial edema has slightly worsened as compared to the prior  exam with increased perihilar vascular congestion and peribronchial cuffing. No pneumothorax.  Osseous structures are unchanged.  IMPRESSION:  Interval worsening of interstitial pulmonary edema as compared to 06/25/2013.   Assessment/Plan: Appears to be stable from a pulmonary standpoint.  Continue current care per cardiology, pulm/CCM.      LOS: 6 days    COLLINS,GINA H 06/26/2013  I have seen and examined the patient and agree with the assessment and plan as outlined.  OWEN,CLARENCE H 06/26/2013 12:14 PM

## 2013-06-26 NOTE — Progress Notes (Addendum)
Patient ID: Joseph Garner, male   DOB: 09-08-1924, 79 y.o.   MRN: [id number]    SUBJECTIVE: Breathing better.  Remains in NSR.  Diuresed well yesterday. Weight trending down.  BUN/creatinine rising.   Marland Kitchen antiseptic oral rinse  15 mL Mouth Rinse BID  . aspirin EC  325 mg Oral Daily  . darifenacin  7.5 mg Oral Daily  . doxazosin  2 mg Oral QHS  . feeding supplement  1 Container Oral TID WC  . ferrous fumarate  1 tablet Oral BID  . folic acid  1 mg Oral Daily  . furosemide  60 mg Oral BID  . heparin  5,000 Units Subcutaneous Q8H  . methylPREDNISolone (SOLU-MEDROL) injection  60 mg Intravenous Q6H  . metoprolol tartrate  12.5 mg Oral BID  . pantoprazole  40 mg Oral Daily  . sodium chloride  3 mL Intravenous Q12H      Filed Vitals:   06/26/13 0500 06/26/13 0600 06/26/13 0700 06/26/13 0800  BP: 125/57 147/49 141/48 112/72  Pulse: 65 68 71 64  Temp:    98.1 F (36.7 C)  TempSrc:    Oral  Resp: 19 18 24 25   Height:      Weight: 68.2 kg (150 lb 5.7 oz)     SpO2: 89% 88% 93% 94%    Intake/Output Summary (Last 24 hours) at 06/26/13 1007 Last data filed at 06/26/13 0800  Gross per 24 hour  Intake    240 ml  Output   2080 ml  Net  -1840 ml    LABS: Basic Metabolic Panel:  Recent Labs  40/34/74 0438 06/26/13 0900  NA 128* 133*  K 4.1 3.5  CL 89* 91*  CO2 28 30  GLUCOSE 131* 138*  BUN 33* 46*  CREATININE 1.20 1.37*  CALCIUM 8.5 8.7   Liver Function Tests:  Recent Labs  06/25/13 0438  AST 55*  ALT 43  ALKPHOS 126*  BILITOT 0.8  PROT 6.1  ALBUMIN 2.3*   No results found for this basename: LIPASE, AMYLASE,  in the last 72 hours CBC:  Recent Labs  06/24/13 0425 06/25/13 0438  WBC 14.0* 11.0*  HGB 10.3* 10.4*  HCT 28.2* 28.7*  MCV 87.0 87.8  PLT 282 278   Cardiac Enzymes: No results found for this basename: CKTOTAL, CKMB, CKMBINDEX, TROPONINI,  in the last 72 hours BNP: No components found with this basename: POCBNP,  D-Dimer: No results found for  this basename: DDIMER,  in the last 72 hours Hemoglobin A1C: No results found for this basename: HGBA1C,  in the last 72 hours Fasting Lipid Panel: No results found for this basename: CHOL, HDL, LDLCALC, TRIG, CHOLHDL, LDLDIRECT,  in the last 72 hours Thyroid Function Tests: No results found for this basename: TSH, T4TOTAL, FREET3, T3FREE, THYROIDAB,  in the last 72 hours Anemia Panel: No results found for this basename: VITAMINB12, FOLATE, FERRITIN, TIBC, IRON, RETICCTPCT,  in the last 72 hours  RADIOLOGY: Ct Angio Chest Pe W/cm &/or Wo Cm  06/21/2013   *RADIOLOGY REPORT*  Clinical Data: Shortness of breath, recent CABG and wedge resection  CT ANGIOGRAPHY CHEST  Technique:  Multidetector CT imaging of the chest using the standard protocol during bolus administration of intravenous contrast. Multiplanar reconstructed images including MIPs were obtained and reviewed to evaluate the vascular anatomy.  Contrast: OMNIPAQUE IOHEXOL 350 MG/ML SOLN  Comparison: Chest radiograph 06/20/2013, PET CT 05/26/2013  Findings: Moderate sized bilateral pleural effusions are noted with areas of interlobular  septal thickening and ground-glass airspace opacity.  Bronchial wall thickening is noted centrally.  Central airways are patent.  No pneumothorax.  Evidence of median sternotomy.  No acute osseous finding.  The study is of adequate technical quality for evaluation for pulmonary embolism up to and including the 3rd order pulmonary arteries.  Mild prominence of hila bilaterally is likely reactive without measurable lymphadenopathy. No focal filling defect is seen to suggest acute pulmonary embolism.  Mild pectus excavatum deformity.  Mild cardiomegaly with small pericardial effusion.  IMPRESSION: No CT evidence for acute pulmonary embolism.  Evidence of pulmonary edema with moderate bilateral pleural effusions.   Original Report Authenticated By: Christiana Pellant, M.D.   Dg Chest Port 1 View  06/26/2013    *RADIOLOGY REPORT*  Clinical Data: Evaluate edema  PORTABLE CHEST - 1 VIEW  Comparison: Prior radiograph from 06/25/2013  Findings: Median sternotomy wires remain intact.  Prosthetic mitral valve is again noted.  Cardiomegaly is not significantly changed.  Patchy left lower lobe opacity persists, which likely reflects a combination of atelectasis, pleural effusion, and edema. Interstitial edema has slightly worsened as compared to the prior exam with increased perihilar vascular congestion and peribronchial cuffing.  No pneumothorax.  Osseous structures are unchanged.  IMPRESSION: Interval worsening of interstitial pulmonary edema as compared to 06/25/2013.   Original Report Authenticated By: Rise Mu, M.D.   Dg Chest Port 1 View  06/25/2013   *RADIOLOGY REPORT*  Clinical Data: Evaluate edema  PORTABLE CHEST - 1 VIEW  Comparison: 06/24/2013  Findings: Mild interstitial edema, improved.  Patchy left lower lobe opacity, likely a combination atelectasis and small pleural effusion.  No pneumothorax.  Cardiomegaly. Postsurgical changes related to prior CABG. Prosthetic valve.  IMPRESSION: Mild interstitial edema, improved.  Suspected small left pleural effusion.  Associated left lower lobe opacity, likely atelectasis.   Original Report Authenticated By: Charline Bills, M.D.   Dg Chest Port 1 View  06/24/2013   *RADIOLOGY REPORT*  Clinical Data: Follow up infiltrates.  Shortness of breath.  PORTABLE CHEST - 1 VIEW  Comparison: 06/23/2013  Findings: There has been mild progression of the bilateral airspace disease.  The patient has a prosthetic cardiac valve.  Heart size is upper limits normal.  Persistent consolidation or densities at the left lung base.  Negative for pneumothorax.  IMPRESSION: Progression of the bilateral airspace disease.  Findings are suggestive for pulmonary edema.  Persistent densities at the left lung base may represent pleural fluid and consolidation.   Original Report  Authenticated By: Richarda Overlie, M.D.    PHYSICAL EXAM General: NAD Neck: No JVD, no thyromegaly or thyroid nodule.  Lungs: Crackles at bases bilaterally.  CV: Nondisplaced PMI.  Heart regular S1/S2, no S3/S4, 2/6 SEM.  1+ ankle edema.  No carotid bruit.    Abdomen: Soft, nontender, no hepatosplenomegaly, no distention.  Neurologic: Alert and oriented x 3.  Psych: Normal affect. Extremities: No clubbing or cyanosis.   TELEMETRY: Reviewed telemetry pt in NSR with 1st degree AV block.  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  1. CHF: Acute on chronic diastolic CHF.  Volume status looks better, weight is down, and creatinine/BUN are rising.  Will transition to po Lasix today.  2. Atrial flutter: Remains in NSR today.   3. Possible pneumonitis: On steroids per pulmonary.  Could be related to amiodarone.  Off  amiodarone now.  4. Pulmonary: s/p RLL wedge resection.  Still on high oxygen rate.  Does not look markedly volume overloaded at this point. Suspect main issue currently causing hypoxia may be pneumonitis. As above, po Lasix with rising creatinine.  5. CAD: Stable with no chest pain.  6. Can transfer step down.  Will need CIR.   Marca Ancona 06/26/2013 10:11 AM

## 2013-06-26 NOTE — Progress Notes (Signed)
PULMONARY  / CRITICAL CARE MEDICINE  Name: Joseph Garner MRN: [id number] DOB: 14-Jul-1924    ADMISSION DATE:  06/20/2013 CONSULTATION DATE:  8/27  REFERRING MD :  Anne Fu PRIMARY SERVICE:  [place]  CHIEF COMPLAINT/reason for consult: hypoxia and pulmonary infiltrates   BRIEF PATIENT DESCRIPTION:  79yo WM with a history of HTN, GERD CAD and severe AS, s/p AVR, 1 vessel CABG and RLL wedge resection of lung for lesion on 8/8 by Dr. Tyrone Sage. Admitted on 8/24 and felt to be  in acute diastolic CHF with O2 sats 89%. PCCM asked to see on 8/27 for persistent hypoxia and pulmonary infiltrates in spite of diuretics.    SIGNIFICANT EVENTS / STUDIES:  ECHO 8/25: nml LV size, EF 65-70%, gd 2 diastolic dysfxn, mild PAH (PAS 39), Aortic Valve prosthesis w/ nml ROM CT chest 8/25: bilateral effusions, bilateral diffuse airspace disease.  Right thoracentesis 8/26: 500 ml No samples sent   LINES / TUBES:  CULTURES: bcx2 8/24>>>> Urine strep and legionella antigens 8/24: both neg   ANTIBIOTICS: Cefepime 8/24>>> vanc 8/24>>>  SUBJECTIVE:  On [place] O2 and weaning off well. No distress. No new complaints  VITAL SIGNS: Temp:  [97.7 F (36.5 C)-98.1 F (36.7 C)] 98.1 F (36.7 C) (08/30 1600) Pulse Rate:  [56-78] 59 (08/30 1600) Resp:  [11-29] 18 (08/30 1600) BP: (112-158)/(40-72) 128/42 mmHg (08/30 1600) SpO2:  [85 %-98 %] 95 % (08/30 1600) Weight:  [68.2 kg (150 lb 5.7 oz)] 68.2 kg (150 lb 5.7 oz) (08/30 0500)  PHYSICAL EXAMINATION: General:  Stronger in chair Neuro:  Awake, HOH, moves all ext  HEENT:  [place], no JVD  Cardiovascular: Reg, no M noted  Lungs:  Basilar crackles Abdomen:  Soft, no OM  Musculoskeletal:  Intact  Skin:  Dry, + LE edema    Recent Labs Lab 06/24/13 0425 06/25/13 0438 06/26/13 0900  NA 126* 128* 133*  K 3.3* 4.1 3.5  CL 87* 89* 91*  CO2 26 28 30   BUN 24* 33* 46*  CREATININE 1.18 1.20 1.37*  GLUCOSE 100* 131* 138*    Recent Labs Lab 06/23/13 0420  06/24/13 0425 06/25/13 0438  HGB 8.4* 10.3* 10.4*  HCT 22.9* 28.2* 28.7*  WBC 15.4* 14.0* 11.0*  PLT 295 282 278   CXR: no sig change bilateral AS disease ASSESSMENT / PLAN:  Acute hypoxic respiratory failure with bilateral infiltrates Suspect components of edema and pneumonitis Clinically improving  Plan/rec: Decrease Lasix to daily Decrease methylpred to q 12 hrs  Billy Fischer, MD ; [facility] service Mobile [phone].  After 5:30 PM or weekends, call [phone]

## 2013-06-27 LAB — BASIC METABOLIC PANEL
BUN: 47 mg/dL — ABNORMAL HIGH (ref 6–23)
Calcium: 8.4 mg/dL (ref 8.4–10.5)
GFR calc non Af Amer: 52 mL/min — ABNORMAL LOW (ref >90)
Glucose, Bld: 115 mg/dL — ABNORMAL HIGH (ref 70–99)
Potassium: 3.3 mEq/L — ABNORMAL LOW (ref 3.5–5.1)

## 2013-06-27 LAB — CULTURE, BLOOD (ROUTINE X 2)
Culture: NO GROWTH
Culture: NO GROWTH

## 2013-06-27 MED ORDER — POTASSIUM CHLORIDE CRYS ER 20 MEQ PO TBCR
20.0000 meq | EXTENDED_RELEASE_TABLET | Freq: Every day | ORAL | Status: DC
  Administered 2013-06-27 – 2013-06-29 (×3): 20 meq via ORAL
  Filled 2013-06-27 (×4): qty 1

## 2013-06-27 NOTE — Progress Notes (Addendum)
Subjective: States he feals better,stronger  Objective: Vital signs in last 24 hours: Temp:  [98 F (36.7 C)-98.4 F (36.9 C)] 98.4 F (36.9 C) (08/31 0520) Pulse Rate:  [56-67] 60 (08/31 0520) Resp:  [11-24] 16 (08/31 0520) BP: (118-141)/(40-59) 132/50 mmHg (08/31 0520) SpO2:  [95 %-98 %] 98 % (08/31 0520) Weight:  [67.1 kg (147 lb 14.9 oz)] 67.1 kg (147 lb 14.9 oz) (08/31 0500) Weight change: -1.1 kg (-2 lb 6.8 oz) Last BM Date: 06/26/13  Intake/Output from previous day: 08/30 0701 - 08/31 0700 In: 900 [P.O.:900] Out: 1680 [Urine:1680] Intake/Output this shift:    Neurologic: Mental status: Alert, oriented, thought content appropriate, alert, thinks he is in [place] is easily reoriented and realizes he is intemittantly confused  Lab Results:  Recent Labs  06/25/13 0438  WBC 11.0*  HGB 10.4*  HCT 28.7*  PLT 278   BMET  Recent Labs  06/26/13 0900 06/27/13 0500  NA 133* 130*  K 3.5 3.3*  CL 91* 91*  CO2 30 29  GLUCOSE 138* 115*  BUN 46* 47*  CREATININE 1.37* 1.21  CALCIUM 8.7 8.4    Studies/Results: Dg Chest Port 1 View  06/26/2013   *RADIOLOGY REPORT*  Clinical Data: Evaluate edema  PORTABLE CHEST - 1 VIEW  Comparison: Prior radiograph from 06/25/2013  Findings: Median sternotomy wires remain intact.  Prosthetic mitral valve is again noted.  Cardiomegaly is not significantly changed.  Patchy left lower lobe opacity persists, which likely reflects a combination of atelectasis, pleural effusion, and edema. Interstitial edema has slightly worsened as compared to the prior exam with increased perihilar vascular congestion and peribronchial cuffing.  No pneumothorax.  Osseous structures are unchanged.  IMPRESSION: Interval worsening of interstitial pulmonary edema as compared to 06/25/2013.   Original Report Authenticated By: Rise Mu, M.D.   Dg Chest Port 1 View  06/25/2013   *RADIOLOGY REPORT*  Clinical Data: Evaluate edema  PORTABLE CHEST - 1  VIEW  Comparison: 06/24/2013  Findings: Mild interstitial edema, improved.  Patchy left lower lobe opacity, likely a combination atelectasis and small pleural effusion.  No pneumothorax.  Cardiomegaly. Postsurgical changes related to prior CABG. Prosthetic valve.  IMPRESSION: Mild interstitial edema, improved.  Suspected small left pleural effusion.  Associated left lower lobe opacity, likely atelectasis.   Original Report Authenticated By: Charline Bills, M.D.    Medications:  Scheduled: . antiseptic oral rinse  15 mL Mouth Rinse BID  . aspirin EC  325 mg Oral Daily  . darifenacin  7.5 mg Oral Daily  . doxazosin  2 mg Oral QHS  . feeding supplement  1 Container Oral TID WC  . ferrous fumarate  1 tablet Oral BID  . folic acid  1 mg Oral Daily  . furosemide  60 mg Oral Daily  . heparin  5,000 Units Subcutaneous Q8H  . methylPREDNISolone (SOLU-MEDROL) injection  60 mg Intravenous Q12H  . metoprolol tartrate  12.5 mg Oral BID  . pantoprazole  40 mg Oral Daily  . sodium chloride  3 mL Intravenous Q12H    Assessment/Plan: 1. Delirium persists may benefit from moving  Out of unit and when solumedrol can be reduced 2. Creatinine up yesterday and lasix reduced.  I will reorder basic metabolic tomorrow 3. Sodium improved with contraction of volume..133   LOS: 7 days   Ginette Otto 06/27/2013, 8:29 AM  Will replace potassium

## 2013-06-27 NOTE — Progress Notes (Addendum)
       [address]       Joseph Garner [address]             [phone]               Subjective: Stable, feels better overall.   Objective: Vital signs in last 24 hours: Patient Vitals for the past 24 hrs:  BP Temp Temp src Pulse Resp SpO2 Weight  06/27/13 0800 129/54 mmHg 96.6 F (35.9 C) - 53 18 96 % -  06/27/13 0520 132/50 mmHg 98.4 F (36.9 C) Oral 60 16 98 % -  06/27/13 0500 - - - - - - 147 lb 14.9 oz (67.1 kg)  06/27/13 0000 138/46 mmHg - - 59 15 96 % -  06/26/13 2343 - 98.3 F (36.8 C) Oral 59 17 96 % -  06/26/13 2000 140/48 mmHg 98.3 F (36.8 C) Oral 61 16 96 % -  06/26/13 1800 137/56 mmHg - - 67 24 96 % -  06/26/13 1600 128/42 mmHg 98.1 F (36.7 C) Oral 59 18 95 % -  06/26/13 1400 141/59 mmHg - - 58 16 98 % -  06/26/13 1300 134/51 mmHg - - 61 15 95 % -  06/26/13 1200 118/46 mmHg 98 F (36.7 C) Oral 56 11 97 % -  06/26/13 1000 122/40 mmHg - - 61 17 96 % -   Current Weight  06/27/13 147 lb 14.9 oz (67.1 kg)     Intake/Output from previous day: 08/30 0701 - 08/31 0700 In: 900 [P.O.:900] Out: 1680 [Urine:1680]    PHYSICAL EXAM:  Heart: RRR Lungs: Scattered crackles bilaterally Wound: Healing well Extremities: Mild LE edema    Lab Results: CBC: Recent Labs  06/25/13 0438  WBC 11.0*  HGB 10.4*  HCT 28.7*  PLT 278   BMET:  Recent Labs  06/26/13 0900 06/27/13 0500  NA 133* 130*  K 3.5 3.3*  CL 91* 91*  CO2 30 29  GLUCOSE 138* 115*  BUN 46* 47*  CREATININE 1.37* 1.21  CALCIUM 8.7 8.4    PT/INR: No results found for this basename: LABPROT, INR,  in the last 72 hours    Assessment/Plan: Remains stable, pulm status improving slowly. Continue current care.   LOS: 7 days    Joseph Garner,Joseph Garner 06/27/2013  I have seen and examined the patient and agree with the assessment and plan as outlined.  Poor po intake and severe malnutrition remains a serious problem.  I would favor resuming cardiac rehab.  Bartholomew Ramesh  Garner 06/27/2013 11:41 AM

## 2013-06-27 NOTE — Progress Notes (Signed)
Clinical Social Worker (CSW) spoke with daughter Tiajuana Amass and wife Dianne at patient's bedside. Daughter and wife reported that the MD mentioned CIR on Friday 06/25/13 and the daughter and wife want to wait and see if CIR accepts the patient before they make a choice between nursing facilities. CSW explained that CIR might not accept the patient and they will have to make a decision about bed offers at nursing facilites. Daughter and wife verbalized their understanding and were agreeable. [facility] admissions assessment ordered 06/24/13.   Jetta Lout, LCSWA Weekend CSW [phone]

## 2013-06-27 NOTE — Progress Notes (Signed)
Patient ID: Joseph Garner, male   DOB: [birth year], 79 y.o.   MRN: [id number]   SUBJECTIVE: Breathing better.  Remains in NSR.  Renal function stable.  Some confusion, waxes and wanes per nurse.   Marland Kitchen antiseptic oral rinse  15 mL Mouth Rinse BID  . aspirin EC  325 mg Oral Daily  . darifenacin  7.5 mg Oral Daily  . doxazosin  2 mg Oral QHS  . feeding supplement  1 Container Oral TID WC  . ferrous fumarate  1 tablet Oral BID  . folic acid  1 mg Oral Daily  . furosemide  60 mg Oral Daily  . heparin  5,000 Units Subcutaneous Q8H  . methylPREDNISolone (SOLU-MEDROL) injection  60 mg Intravenous Q12H  . metoprolol tartrate  12.5 mg Oral BID  . pantoprazole  40 mg Oral Daily  . potassium chloride  20 mEq Oral Daily  . sodium chloride  3 mL Intravenous Q12H      Filed Vitals:   06/27/13 0000 06/27/13 0500 06/27/13 0520 06/27/13 0800  BP: 138/46  132/50 129/54  Pulse: 59  60 53  Temp:   98.4 F (36.9 C) 96.6 F (35.9 C)  TempSrc:   Oral   Resp: 15  16 18   Height:      Weight:  67.1 kg (147 lb 14.9 oz)    SpO2: 96%  98% 96%    Intake/Output Summary (Last 24 hours) at 06/27/13 0917 Last data filed at 06/27/13 0600  Gross per 24 hour  Intake    660 ml  Output   1500 ml  Net   -840 ml    LABS: Basic Metabolic Panel:  Recent Labs  40/98/11 0900 06/27/13 0500  NA 133* 130*  K 3.5 3.3*  CL 91* 91*  CO2 30 29  GLUCOSE 138* 115*  BUN 46* 47*  CREATININE 1.37* 1.21  CALCIUM 8.7 8.4   Liver Function Tests:  Recent Labs  06/25/13 0438  AST 55*  ALT 43  ALKPHOS 126*  BILITOT 0.8  PROT 6.1  ALBUMIN 2.3*   No results found for this basename: LIPASE, AMYLASE,  in the last 72 hours CBC:  Recent Labs  06/25/13 0438  WBC 11.0*  HGB 10.4*  HCT 28.7*  MCV 87.8  PLT 278   Cardiac Enzymes: No results found for this basename: CKTOTAL, CKMB, CKMBINDEX, TROPONINI,  in the last 72 hours BNP: No components found with this basename: POCBNP,  D-Dimer: No results found  for this basename: DDIMER,  in the last 72 hours Hemoglobin A1C: No results found for this basename: HGBA1C,  in the last 72 hours Fasting Lipid Panel: No results found for this basename: CHOL, HDL, LDLCALC, TRIG, CHOLHDL, LDLDIRECT,  in the last 72 hours Thyroid Function Tests: No results found for this basename: TSH, T4TOTAL, FREET3, T3FREE, THYROIDAB,  in the last 72 hours Anemia Panel: No results found for this basename: VITAMINB12, FOLATE, FERRITIN, TIBC, IRON, RETICCTPCT,  in the last 72 hours  RADIOLOGY: Ct Angio Chest Pe W/cm &/or Wo Cm  06/21/2013   *RADIOLOGY REPORT*  Clinical Data: Shortness of breath, recent CABG and wedge resection  CT ANGIOGRAPHY CHEST  Technique:  Multidetector CT imaging of the chest using the standard protocol during bolus administration of intravenous contrast. Multiplanar reconstructed images including MIPs were obtained and reviewed to evaluate the vascular anatomy.  Contrast: OMNIPAQUE IOHEXOL 350 MG/ML SOLN  Comparison: Chest radiograph 06/20/2013, PET CT 05/26/2013  Findings: Moderate sized bilateral pleural  effusions are noted with areas of interlobular septal thickening and ground-glass airspace opacity.  Bronchial wall thickening is noted centrally.  Central airways are patent.  No pneumothorax.  Evidence of median sternotomy.  No acute osseous finding.  The study is of adequate technical quality for evaluation for pulmonary embolism up to and including the 3rd order pulmonary arteries.  Mild prominence of hila bilaterally is likely reactive without measurable lymphadenopathy. No focal filling defect is seen to suggest acute pulmonary embolism.  Mild pectus excavatum deformity.  Mild cardiomegaly with small pericardial effusion.  IMPRESSION: No CT evidence for acute pulmonary embolism.  Evidence of pulmonary edema with moderate bilateral pleural effusions.   Original Report Authenticated By: Christiana Pellant, M.D.   Dg Chest Port 1 View  06/26/2013    *RADIOLOGY REPORT*  Clinical Data: Evaluate edema  PORTABLE CHEST - 1 VIEW  Comparison: Prior radiograph from 06/25/2013  Findings: Median sternotomy wires remain intact.  Prosthetic mitral valve is again noted.  Cardiomegaly is not significantly changed.  Patchy left lower lobe opacity persists, which likely reflects a combination of atelectasis, pleural effusion, and edema. Interstitial edema has slightly worsened as compared to the prior exam with increased perihilar vascular congestion and peribronchial cuffing.  No pneumothorax.  Osseous structures are unchanged.  IMPRESSION: Interval worsening of interstitial pulmonary edema as compared to 06/25/2013.   Original Report Authenticated By: Rise Mu, M.D.   Dg Chest Port 1 View  06/25/2013   *RADIOLOGY REPORT*  Clinical Data: Evaluate edema  PORTABLE CHEST - 1 VIEW  Comparison: 06/24/2013  Findings: Mild interstitial edema, improved.  Patchy left lower lobe opacity, likely a combination atelectasis and small pleural effusion.  No pneumothorax.  Cardiomegaly. Postsurgical changes related to prior CABG. Prosthetic valve.  IMPRESSION: Mild interstitial edema, improved.  Suspected small left pleural effusion.  Associated left lower lobe opacity, likely atelectasis.   Original Report Authenticated By: Charline Bills, M.D.   Dg Chest Port 1 View  06/24/2013   *RADIOLOGY REPORT*  Clinical Data: Follow up infiltrates.  Shortness of breath.  PORTABLE CHEST - 1 VIEW  Comparison: 06/23/2013  Findings: There has been mild progression of the bilateral airspace disease.  The patient has a prosthetic cardiac valve.  Heart size is upper limits normal.  Persistent consolidation or densities at the left lung base.  Negative for pneumothorax.  IMPRESSION: Progression of the bilateral airspace disease.  Findings are suggestive for pulmonary edema.  Persistent densities at the left lung base may represent pleural fluid and consolidation.   Original Report  Authenticated By: Richarda Overlie, M.D.    PHYSICAL EXAM General: NAD Neck: No JVD, no thyromegaly or thyroid nodule.  Lungs: Crackles at bases bilaterally.  CV: Nondisplaced PMI.  Heart regular S1/S2, no S3/S4, 2/6 SEM.  1+ ankle edema.  No carotid bruit.    Abdomen: Soft, nontender, no hepatosplenomegaly, no distention.  Neurologic: Alert and oriented x 3.  Psych: Normal affect. Extremities: No clubbing or cyanosis.   TELEMETRY: Reviewed telemetry pt in NSR with 1st degree AV block.  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  1. CHF: Acute on chronic diastolic CHF.  Volume status looks better.  He has been transitioned to po Lasix.  Creatinine lower today. 2. Atrial flutter: Remains in NSR today.   3. Possible pneumonitis: On steroids per pulmonary.  Could be related  to amiodarone.  Off amiodarone now.  4. Pulmonary: s/p RLL wedge resection.  Still on high oxygen rate.  Does not look markedly volume overloaded at this point. Suspect main issue currently causing hypoxia may be pneumonitis. As above, now on po Lasix. 5. CAD: Stable with no chest pain.  6. Delirium: Some confusion this morning.  Suspect related to multiple active medical problems.   Marca Ancona 06/27/2013 9:17 AM

## 2013-06-28 ENCOUNTER — Inpatient Hospital Stay (HOSPITAL_COMMUNITY): Payer: Medicare Other

## 2013-06-28 LAB — CBC WITH DIFFERENTIAL/PLATELET
Basophils Relative: 0 % (ref 0–1)
Eosinophils Absolute: 0 10*3/uL (ref 0.0–0.7)
Hemoglobin: 10.5 g/dL — ABNORMAL LOW (ref 13.0–17.0)
MCH: 31.3 pg (ref 26.0–34.0)
MCHC: 34.3 g/dL (ref 30.0–36.0)
Monocytes Absolute: 0.4 10*3/uL (ref 0.1–1.0)
Monocytes Relative: 4 % (ref 3–12)
Neutrophils Relative %: 92 % — ABNORMAL HIGH (ref 43–77)

## 2013-06-28 LAB — BASIC METABOLIC PANEL
BUN: 43 mg/dL — ABNORMAL HIGH (ref 6–23)
GFR calc non Af Amer: 56 mL/min — ABNORMAL LOW (ref >90)
Glucose, Bld: 124 mg/dL — ABNORMAL HIGH (ref 70–99)
Potassium: 3.8 mEq/L (ref 3.5–5.1)

## 2013-06-28 MED ORDER — BOOST / RESOURCE BREEZE PO LIQD
1.0000 | Freq: Three times a day (TID) | ORAL | Status: DC
  Administered 2013-06-28 – 2013-07-02 (×7): 1 via ORAL

## 2013-06-28 NOTE — Progress Notes (Signed)
Subjective: Patient pleasant, no apparent distress  Objective: Vital signs in last 24 hours: Temp:  [97.5 F (36.4 C)-98.7 F (37.1 C)] 98.7 F (37.1 C) (09/01 0000) Pulse Rate:  [50-62] 56 (09/01 1100) Resp:  [8-26] 12 (09/01 1100) BP: (122-161)/(50-69) 143/69 mmHg (09/01 1100) SpO2:  [93 %-100 %] 99 % (09/01 1100) Weight:  [67.4 kg (148 lb 9.4 oz)] 67.4 kg (148 lb 9.4 oz) (09/01 0500) Weight change: 0.3 kg (10.6 oz) Last BM Date: 06/27/13  Intake/Output from previous day: 08/31 0701 - 09/01 0700 In: 625 [P.O.:625] Out: 2150 [Urine:2150] Intake/Output this shift: Total I/O In: 100 [P.O.:100] Out: 200 [Urine:200]  Resp: Moderate air movement bilateral without rales or rhonchi Cardio: regular rate and rhythm  Lab Results:  Results for orders placed during the hospital encounter of 06/20/13 (from the past 24 hour(s))  BASIC METABOLIC PANEL     Status: Abnormal   Collection Time    06/28/13  4:20 AM      Result Value Range   Sodium 136  135 - 145 mEq/L   Potassium 3.8  3.5 - 5.1 mEq/L   Chloride 95 (*) 96 - 112 mEq/L   CO2 33 (*) 19 - 32 mEq/L   Glucose, Bld 124 (*) 70 - 99 mg/dL   BUN 43 (*) 6 - 23 mg/dL   Creatinine, Ser 4.69  0.50 - 1.35 mg/dL   Calcium 8.4  8.4 - 62.9 mg/dL   GFR calc non Af Amer 56 (*) >90 mL/min   GFR calc Af Amer 65 (*) >90 mL/min  CBC WITH DIFFERENTIAL     Status: Abnormal   Collection Time    06/28/13  4:20 AM      Result Value Range   WBC 11.3 (*) 4.0 - 10.5 K/uL   RBC 3.36 (*) 4.22 - 5.81 MIL/uL   Hemoglobin 10.5 (*) 13.0 - 17.0 g/dL   HCT 52.8 (*) 41.3 - 24.4 %   MCV 91.1  78.0 - 100.0 fL   MCH 31.3  26.0 - 34.0 pg   MCHC 34.3  30.0 - 36.0 g/dL   RDW 01.0  27.2 - 53.6 %   Platelets 241  150 - 400 K/uL   Neutrophils Relative % 92 (*) 43 - 77 %   Neutro Abs 10.4 (*) 1.7 - 7.7 K/uL   Lymphocytes Relative 5 (*) 12 - 46 %   Lymphs Abs 0.5 (*) 0.7 - 4.0 K/uL   Monocytes Relative 4  3 - 12 %   Monocytes Absolute 0.4  0.1 - 1.0 K/uL   Eosinophils Relative 0  0 - 5 %   Eosinophils Absolute 0.0  0.0 - 0.7 K/uL   Basophils Relative 0  0 - 1 %   Basophils Absolute 0.0  0.0 - 0.1 K/uL      Studies/Results: Dg Chest 2 View  06/28/2013   *RADIOLOGY REPORT*  Clinical Data: Weakness  CHEST - 2 VIEW  Comparison: 06/26/2013  Findings: Cardiac shadow is stable.  Persistent bilateral basilar changes are seen although some improved aeration is noted.  No new focal infiltrate is seen.  IMPRESSION: Persistent bibasilar changes although slight improvement is noted.   Original Report Authenticated By: Alcide Clever, M.D.    Medications:  Scheduled: . antiseptic oral rinse  15 mL Mouth Rinse BID  . aspirin EC  325 mg Oral Daily  . darifenacin  7.5 mg Oral Daily  . doxazosin  2 mg Oral QHS  . feeding supplement  1 Container  Oral TID WC  . ferrous fumarate  1 tablet Oral BID  . folic acid  1 mg Oral Daily  . furosemide  60 mg Oral Daily  . heparin  5,000 Units Subcutaneous Q8H  . methylPREDNISolone (SOLU-MEDROL) injection  60 mg Intravenous Q12H  . metoprolol tartrate  12.5 mg Oral BID  . pantoprazole  40 mg Oral Daily  . potassium chloride  20 mEq Oral Daily  . sodium chloride  3 mL Intravenous Q12H   Continuous:   Assessment/Plan: Delirium stable Hyponatremia resolved Acute/chronic diastolic CHF /pneumonitis improved, continue treatment as outlined by cardiology, pulmonary critical care medicine  LOS: 8 days   Joseph Garner D 06/28/2013, 11:41 AM

## 2013-06-28 NOTE — Progress Notes (Signed)
Physical Therapy Treatment Patient Details Name: Joseph Garner MRN: [id number] DOB: 08-23-1924 Today's Date: 06/28/2013 Time: 4098-1191 PT Time Calculation (min): 27 min  PT Assessment / Plan / Recommendation  History of Present Illness 79yo WM with a history of HTN, GERD CAD and severe AS, s/p AVR, 1 vessel CABG and RLL wedge resection of lung for lesion on 8/8 by Dr. Tyrone Sage. Admitted on 8/24 and felt to be  in acute diastolic CHF with O2 sats 89%. PCCM asked to see on 8/27 for persistent hypoxia and pulmonary infiltrates in spite of diuretics.      PT Comments   Pt progressing well and con't to demo to be a great canidate for CIR. PT con't to be deconditioned and requires use of RW for safe amb as well as assist for ADLs.  Pt motivated to work with PT and return to independent function.   Follow Up Recommendations  CIR     Does the patient have the potential to tolerate intense rehabilitation     Barriers to Discharge        Equipment Recommendations  None recommended by PT    Recommendations for Other Services    Frequency Min 3X/week   Progress towards PT Goals Progress towards PT goals: Progressing toward goals  Plan Current plan remains appropriate    Precautions / Restrictions Precautions Precautions: Fall;Sternal Precaution Comments: v/c's to use heart pillow Restrictions Weight Bearing Restrictions: No   Pertinent Vitals/Pain Denies pain    Mobility  Bed Mobility Bed Mobility: Supine to Sit Supine to Sit: 3: Mod assist;HOB elevated Details for Bed Mobility Assistance: A for trunk elevation due to pt unable to pull with UEs due to sternal precautions Transfers Transfers: Sit to Stand;Stand to Sit Sit to Stand: 4: Min assist;From bed;Without upper extremity assist Details for Transfer Assistance: used rocking/anterior weight shift method due to pt inability to push up with UEs due to sternal precautions Ambulation/Gait Ambulation/Gait Assistance: 4: Min  assist Ambulation Distance (Feet): 120 Feet Assistive device: Rolling walker Ambulation/Gait Assistance Details: short, shuffled steps, freq mini standing rest breaks but did not require to sit and rest. pt with improved walker management this date Gait Pattern: Step-to pattern;Trunk flexed;Shuffle Gait velocity: slow Stairs: No    Exercises General Exercises - Lower Extremity Ankle Circles/Pumps: AROM;10 reps;Seated;Both Long Arc Quad: AROM;10 reps;Seated Hip ABduction/ADduction: AROM;10 reps;Seated Hip Flexion/Marching: AROM;Both;10 reps;Seated   PT Diagnosis:    PT Problem List:   PT Treatment Interventions:     PT Goals (current goals can now be found in the care plan section) Acute Rehab PT Goals PT Goal Formulation: With patient Time For Goal Achievement: 07/12/13 Potential to Achieve Goals: Good  Visit Information  Last PT Received On: 06/28/13 Assistance Needed: +1 History of Present Illness: 79yo WM with a history of HTN, GERD CAD and severe AS, s/p AVR, 1 vessel CABG and RLL wedge resection of lung for lesion on 8/8 by Dr. Tyrone Sage. Admitted on 8/24 and felt to be  in acute diastolic CHF with O2 sats 89%. PCCM asked to see on 8/27 for persistent hypoxia and pulmonary infiltrates in spite of diuretics.       Subjective Data  Subjective: pt eage for PT   Cognition  Cognition Arousal/Alertness: Awake/alert Behavior During Therapy: WFL for tasks assessed/performed Overall Cognitive Status: Within Functional Limits for tasks assessed    Balance     End of Session PT - End of Session Equipment Utilized During Treatment: Gait belt Activity Tolerance:  Patient limited by fatigue Patient left: in chair;with call bell/phone within reach Nurse Communication: Mobility status;Precautions   GP     Marcene Brawn 06/28/2013, 10:26 AM  Henegar Shock, PT, DPT Pager #: [phone] Office #: [phone]

## 2013-06-28 NOTE — Progress Notes (Signed)
INITIAL NUTRITION ASSESSMENT  DOCUMENTATION CODES Per approved criteria  -Non-severe (moderate) malnutrition in the context of chronic illness   INTERVENTION: 1.  Meals/snacks; peanut butter and cheese crackers between meals.  2.  General healthful diet; encouraged intake.  Discussed needs and nutrition goals with pt.  NUTRITION DIAGNOSIS: Inadequate oral intake related to poor appetite as evidenced by pt report.   Monitor:  1.  Food/Beverage; pt meeting >/=90% estimated needs with tolerance. 2.  Wt/wt change; monitor trends   Reason for Assessment: MST  79 y.o. male  Admitting Dx: Acute and chronic respiratory failure  ASSESSMENT: Pt admitted with shortness of breath. Pt with acute/chronic diastolic CHF/pneumonitis.  Pt eating lunch at time of visit.  Report that his appetite has been very small but is improving. Discussed nutrition needs with patient.  States that his physicians has already impressed upon him the importance of eating better, but "I don't know how to eat more right now."  Discussed eating throughout the day.  Reviewed menu and high-calorie, high-protein foods with pt.  Difficult for pt to find high-protein sources due to personal preferences, but is willing to try snacks between meals.  States that he "hates" Ensure.  RD to change order to [facility].  Pt is engaged and appropriate during visit.  Correctly provides weight history, but did not realize his new wt of 148 lbs after lasix therapy. States "I haven't worn pants in a month."   Nutrition Focused Physical Exam:  Subcutaneous Fat:  Orbital Region: moderate Upper Arm Region: moderate Thoracic and Lumbar Region: moderate-severe  Muscle:  Temple Region: severe Clavicle Bone Region: moderate-severe Clavicle and Acromion Bone Region: moderate Scapular Bone Region: moderate Dorsal Hand: n/a Patellar Region: n/a Anterior Thigh Region: n/a Posterior Calf Region: n/a  Edema: non-pitting  Pt meets criteria  for moderate MALNUTRITION in the context of chronic as evidenced by poor PO meeting <75% of needs for >/=1 month and moderate-severe wasting.  Pt is at risk for developing severe malnutrition based on mental status and ongoing poor PO.    Height: Ht Readings from Last 1 Encounters:  06/20/13 5\' 8"  (1.727 m)    Weight: Wt Readings from Last 1 Encounters:  06/28/13 148 lb 9.4 oz (67.4 kg)    Ideal Body Weight: 70 kg  % Ideal Body Weight: 96%  Wt Readings from Last 10 Encounters:  06/28/13 148 lb 9.4 oz (67.4 kg)  06/15/13 166 lb 10.7 oz (75.6 kg)  06/15/13 166 lb 10.7 oz (75.6 kg)  05/24/13 162 lb (73.483 kg)  05/12/13 165 lb (74.844 kg)  05/12/13 165 lb (74.844 kg)  04/27/13 165 lb (74.844 kg)  03/09/13 165 lb (74.844 kg)  02/03/13 165 lb 6 oz (75.014 kg)  01/25/13 166 lb (75.297 kg)    Usual Body Weight: 165 lbs  % Usual Body Weight: 89%  BMI:  Body mass index is 22.6 kg/(m^2).  Estimated Nutritional Needs: Kcal: 2841-3244 Protein: 75-90g Fluid: ~1.8 L/day or per MD  Skin: incision to chest, thigh, L leg, non-pitting generalized edema  Diet Order: Cardiac  EDUCATION NEEDS: -Education needs addressed   Intake/Output Summary (Last 24 hours) at 06/28/13 1212 Last data filed at 06/28/13 1000  Gross per 24 hour  Intake    300 ml  Output   1675 ml  Net  -1375 ml    Last BM: 8/31  Labs:   Recent Labs Lab 06/26/13 0900 06/27/13 0500 06/28/13 0420  NA 133* 130* 136  K 3.5 3.3* 3.8  CL 91* 91* 95*  CO2 30 29 33*  BUN 46* 47* 43*  CREATININE 1.37* 1.21 1.13  CALCIUM 8.7 8.4 8.4  GLUCOSE 138* 115* 124*    CBG (last 3)   Recent Labs  06/25/13 2127  GLUCAP 146*    Scheduled Meds: . antiseptic oral rinse  15 mL Mouth Rinse BID  . aspirin EC  325 mg Oral Daily  . darifenacin  7.5 mg Oral Daily  . doxazosin  2 mg Oral QHS  . feeding supplement  1 Container Oral TID WC  . ferrous fumarate  1 tablet Oral BID  . folic acid  1 mg Oral Daily  .  furosemide  60 mg Oral Daily  . heparin  5,000 Units Subcutaneous Q8H  . methylPREDNISolone (SOLU-MEDROL) injection  60 mg Intravenous Q12H  . metoprolol tartrate  12.5 mg Oral BID  . pantoprazole  40 mg Oral Daily  . potassium chloride  20 mEq Oral Daily  . sodium chloride  3 mL Intravenous Q12H    Continuous Infusions:   Past Medical History  Diagnosis Date  . BPH (benign prostatic hypertrophy)   . Diverticulosis 2006  . Depression   . Anemia   . Spinal stenosis   . Lung nodules 2010  . Actinic keratosis, hx of   . Macular degeneration   . Hypertension   . Allergy     allergic rhinitis  . Hearing loss     wears bilateral hearing aids  . Inguinal hernia     left side -surgery planned  . GERD (gastroesophageal reflux disease)   . Arthritis     generalized-more back issues  . Severe aortic stenosis 04/16/2013  . Nodule of right lung 05/24/2013  . CHF (congestive heart failure)   . Shortness of breath   . Atrial flutter     Past Surgical History  Procedure Laterality Date  . Tonsillectomy  1933  . Appendectomy  1947  . Neck surgery  1997    compressed nerve  . Skin biopsy  2003-2011    multiple   . Cataract extraction, bilateral      bilateral  . Inguinal hernia repair Left 02/11/2013    Procedure: LEFT LAPAROSCOPIC REPAIR INGUINAL HERNIA WITH MESH;  Surgeon: Ernestene Mention, MD;  Location: [facility] ORS;  Service: General;  Laterality: Left;  . Insertion of mesh Left 02/11/2013    Procedure: INSERTION OF MESH;  Surgeon: Ernestene Mention, MD;  Location: [facility];  Service: General;  Laterality: Left;  . Hernia repair  02/24/13    lap LIH repair  . Aortic valve replacement N/A 06/04/2013    Procedure: AORTIC VALVE REPLACEMENT (AVR);  Surgeon: Delight Ovens, MD;  Location: [facility] OR;  Service: Open Heart Surgery;  Laterality: N/A;  . Intraoperative transesophageal echocardiogram N/A 06/04/2013    Procedure: INTRAOPERATIVE TRANSESOPHAGEAL ECHOCARDIOGRAM;  Surgeon: Delight Ovens, MD;  Location: [facility] OR;  Service: Open Heart Surgery;  Laterality: N/A;  . Coronary artery bypass graft N/A 06/04/2013    Procedure: CORONARY ARTERY BYPASS GRAFTING (CABG);  Surgeon: Delight Ovens, MD;  Location[facility] OR;  Service: Open Heart Surgery;  Laterality: N/A;  . Wedge resection Right 06/04/2013    Procedure: WEDGE RESECTION Right Lower Lobe;  Surgeon: Delight Ovens, MD;  Location: [facility] OR;  Service: Open Heart Surgery;  Laterality: Right;    Loyce Dys, MS RD LDN Clinical Inpatient Dietitian Pager: [phone] Weekend/After hours pager: [phone]

## 2013-06-28 NOTE — Progress Notes (Addendum)
Patient Name: Joseph Garner Date of Encounter: 06/28/2013    SUBJECTIVE: The patient is awake. Eating breakfast. In no respiratory distress and carries on a reasonable conversation. Recognized me by name.  TELEMETRY:  Normal sinus rhythm.: Filed Vitals:   06/28/13 0600 06/28/13 0700 06/28/13 0800 06/28/13 0900  BP: 159/54 140/60 143/57 154/55  Pulse: 56 62 59 61  Temp:      TempSrc:      Resp: 13 17 18 17   Height:      Weight:      SpO2: 98% 97% 98% 97%    Intake/Output Summary (Last 24 hours) at 06/28/13 0955 Last data filed at 06/28/13 0900  Gross per 24 hour  Intake    525 ml  Output   1775 ml  Net  -1250 ml    LABS: Basic Metabolic Panel:  Recent Labs  40/98/11 0500 06/28/13 0420  NA 130* 136  K 3.3* 3.8  CL 91* 95*  CO2 29 33*  GLUCOSE 115* 124*  BUN 47* 43*  CREATININE 1.21 1.13  CALCIUM 8.4 8.4   CBC:  Recent Labs  06/28/13 0420  WBC 11.3*  NEUTROABS 10.4*  HGB 10.5*  HCT 30.6*  MCV 91.1  PLT 241   I/O: -12,750 cc since admission  Radiology/Studies:  *RADIOLOGY REPORT*  Clinical Data: Weakness  CHEST - 2 VIEW  Comparison: 06/26/2013  Findings: Cardiac shadow is stable. Persistent bilateral basilar  changes are seen although some improved aeration is noted. No new  focal infiltrate is seen.  IMPRESSION:  Persistent bibasilar changes although slight improvement is noted.  Original Report Authenticated By: Alcide Clever, M.D.         Physical Exam: Blood pressure 154/55, pulse 61, temperature 98.7 F (37.1 C), temperature source Oral, resp. rate 17, height 5\' 8"  (1.727 m), weight 67.4 kg (148 lb 9.4 oz), SpO2 97.00%. Weight change: 0.3 kg (10.6 oz)   Clear anterior and posterior lung fields  Aortic valve/ outflow systolic murmur but no aortic regurgitation.  No edema.  No significant JVD.  Alert and conversant.  ASSESSMENT:  1. Acute on chronic respiratory failure, improving. Etiology likely mixed with a component of  interstitial lung disease and CHF.  2. Acute on chronic  diastolic heart failure, improving  3. No Atrial flutter, now off amiodarone.  4. Status post aortic valve replaced   Plan:  1. Continue current heart failure regimen  2. Physical therapy  3. No change in diuretic regimen  Signed, Lesleigh Noe 06/28/2013, 9:55 AM

## 2013-06-29 DIAGNOSIS — E440 Moderate protein-calorie malnutrition: Secondary | ICD-10-CM | POA: Insufficient documentation

## 2013-06-29 LAB — BASIC METABOLIC PANEL
BUN: 38 mg/dL — ABNORMAL HIGH (ref 6–23)
Calcium: 8.2 mg/dL — ABNORMAL LOW (ref 8.4–10.5)
Creatinine, Ser: 1.06 mg/dL (ref 0.50–1.35)
GFR calc Af Amer: 71 mL/min — ABNORMAL LOW (ref >90)
GFR calc non Af Amer: 61 mL/min — ABNORMAL LOW (ref >90)
Potassium: 3.6 mEq/L (ref 3.5–5.1)

## 2013-06-29 MED ORDER — PREDNISONE 50 MG PO TABS
60.0000 mg | ORAL_TABLET | Freq: Every day | ORAL | Status: DC
  Administered 2013-06-29 – 2013-07-02 (×4): 60 mg via ORAL
  Filled 2013-06-29 (×5): qty 1

## 2013-06-29 NOTE — Progress Notes (Signed)
Subjective:  Sitting up in chair, nasal cannula oxygen, doing much better today. No complaints.  Objective:  Vital Signs in the last 24 hours: Temp:  [97.4 F (36.3 C)-98.5 F (36.9 C)] 98.2 F (36.8 C) (09/02 0808) Pulse Rate:  [49-58] 56 (09/02 0808) Resp:  [11-20] 14 (09/02 0808) BP: (118-152)/(50-69) 132/55 mmHg (09/02 0808) SpO2:  [87 %-100 %] 98 % (09/02 0808) Weight:  [65.4 kg (144 lb 2.9 oz)] 65.4 kg (144 lb 2.9 oz) (09/02 0500)  Intake/Output from previous day: 09/01 0701 - 09/02 0700 In: 423 [P.O.:420; I.V.:3] Out: 1600 [Urine:1600]   Physical Exam: General: Somewhat frail, in no acute distress. Elderly Head:  Normocephalic and atraumatic. Lungs: Less crackles heard at bases.Marland Kitchen Heart: Normal S1 and S2.  Soft systolic murmur, no rubs or gallops.  Abdomen: soft, non-tender, positive bowel sounds. Extremities: No clubbing or cyanosis. No edema. Neurologic: Alert and oriented x 3.    Lab Results:  Recent Labs  06/28/13 0420  WBC 11.3*  HGB 10.5*  PLT 241    Recent Labs  06/28/13 0420 06/29/13 0500  NA 136 135  K 3.8 3.6  CL 95* 95*  CO2 33* 33*  GLUCOSE 124* 117*  BUN 43* 38*  CREATININE 1.13 1.06     Telemetry: Sinus rhythm, first degree AV block Personally viewed.    Cardiac Studies:  Normal ejection fraction, diastolic dysfunction.  Most recent chest x-ray showed improved aeration.  Assessment/Plan:  Principal Problem:   Acute and chronic respiratory failure Active Problems:   S/P AVR   S/P CABG x 1   Orthostatic hypotension   HCAP (healthcare-associated pneumonia)   Hyponatremia   Pleural effusion   CAD (coronary artery disease) of artery bypass graft   Acute respiratory failure with hypoxia   Pulmonary infiltrates   Malnutrition of moderate degree    - Inpatient rehabilitation has reassessed, does not feel that he needs inpatient rehabilitation at this time. Skilled nursing facility.  - Continuing with oral Lasix. Excellent  diuresis throughout hospitalization, net 14 L.  - Off amiodarone due to possible pneumonitis  - Steroid taper directed by pulmonary, now on prednisone 60. Appreciate input.   - Encourage a nutrition   - Bypass stable   - Off antibiotics   - Aortic valve replacement stable   - Respiratory status much improved. Multifactorial from pulmonary edema, diastolic dysfunction as well as pneumonitis.    - Transfer to telemetry   - Await skilled nursing facility.   - Okay to discharge when skilled nursing facility available.    Joseph Garner 06/29/2013, 10:34 AM

## 2013-06-29 NOTE — Progress Notes (Signed)
Subjective: Pt feel much better o2 sat good   Objective: Vital signs in last 24 hours: Temp:  [97.4 F (36.3 C)-98.5 F (36.9 C)] 97.9 F (36.6 C) (09/02 0400) Pulse Rate:  [49-61] 52 (09/02 0400) Resp:  [11-25] 13 (09/02 0400) BP: (118-154)/(50-69) 136/53 mmHg (09/02 0400) SpO2:  [87 %-100 %] 99 % (09/02 0400) Weight:  [65.4 kg (144 lb 2.9 oz)] 65.4 kg (144 lb 2.9 oz) (09/02 0500) Weight change: -2 kg (-4 lb 6.6 oz) Last BM Date: 06/28/13  Intake/Output from previous day: 09/01 0701 - 09/02 0700 In: 423 [P.O.:420; I.V.:3] Out: 1600 [Urine:1600] Intake/Output this shift:    General appearance: alert Resp: rales bibasilar Cardio: regular rate and rhythm  Lab Results:  Recent Labs  06/28/13 0420  WBC 11.3*  HGB 10.5*  HCT 30.6*  PLT 241   BMET  Recent Labs  06/28/13 0420 06/29/13 0500  NA 136 135  K 3.8 3.6  CL 95* 95*  CO2 33* 33*  GLUCOSE 124* 117*  BUN 43* 38*  CREATININE 1.13 1.06  CALCIUM 8.4 8.2*    Studies/Results: Dg Chest 2 View  06/28/2013   *RADIOLOGY REPORT*  Clinical Data: Weakness  CHEST - 2 VIEW  Comparison: 06/26/2013  Findings: Cardiac shadow is stable.  Persistent bilateral basilar changes are seen although some improved aeration is noted.  No new focal infiltrate is seen.  IMPRESSION: Persistent bibasilar changes although slight improvement is noted.   Original Report Authenticated By: Alcide Clever, M.D.    Medications: I have reviewed the patient's current medications.  Assessment/Plan: Respiratory failure- pleural effusion-/ resolving;  s/p right drained-  Pnumonitis-improved with steroid probable amnoidorone Toxicity- taper steroid per Pulmonary- CXR better NSR; Echo EF is ok  Anemia-improved Hyponatremia- Improved;  Poor nutritional and Decondionting- PT and nutritional supplement  Agree with CIR- inpatient rehab.- ready for Rehab Will sign off   LOS: 9 days   Joseph Garner 06/29/2013, 7:53 AM

## 2013-06-29 NOTE — Progress Notes (Signed)
Physical Therapy Treatment Patient Details Name: Joseph Garner MRN: [id number] DOB: [birth year] Today's Date: 06/29/2013 Time: 1324-4010 PT Time Calculation (min): 35 min  PT Assessment / Plan / Recommendation  History of Present Illness 79yo WM with a history of HTN, GERD CAD and severe AS, s/p AVR, 1 vessel CABG and RLL wedge resection of lung for lesion on 8/8 by Dr. Tyrone Sage. Admitted on 8/24 and felt to be  in acute diastolic CHF with O2 sats 89%. PCCM asked to see on 8/27 for persistent hypoxia and pulmonary infiltrates in spite of diuretics.      PT Comments   Pt con't to progress towards goals and demo's motivation to achieve safe indep function for safe d/c home. Pt con't to require use of RW for safe ambulation, minA for stair negotiation and assist for ADLs. Pt also con't to require use of O2 for ambulation. Pt con't to be appropriate for CIR. Anticipate pt would be able to achieve safe mod I function for safe transition home with wife s/p CIR stay.   Follow Up Recommendations  CIR     Does the patient have the potential to tolerate intense rehabilitation     Barriers to Discharge        Equipment Recommendations  None recommended by PT    Recommendations for Other Services    Frequency Min 3X/week   Progress towards PT Goals Progress towards PT goals: Progressing toward goals  Plan Current plan remains appropriate    Precautions / Restrictions Precautions Precautions: Fall;Sternal Precaution Comments: pt aware of precautions and used heart pillow Restrictions Weight Bearing Restrictions: No   Pertinent Vitals/Pain SATURATION QUALIFICATIONS: (This note is used to comply with regulatory documentation for home oxygen)  Patient Saturations on Room Air at Rest = 90%  Patient Saturations on Room Air while Ambulating = 83-90%  Patient Saturations on 2 Liters of oxygen while Ambulating = >95%  Please briefly explain why patient needs home oxygen: SpO2 ,88% on RA     Mobility  Bed Mobility Bed Mobility: Not assessed (pt recieved sitting up in chair) Transfers Transfers: Sit to Stand;Stand to Sit Sit to Stand: 4: Min assist;From bed;Without upper extremity assist Stand to Sit: 5: Supervision;With upper extremity assist;To chair/3-in-1 Details for Transfer Assistance: v/c's for safe hand placement Ambulation/Gait Ambulation/Gait Assistance: 4: Min assist Ambulation Distance (Feet): 250 Feet Assistive device: Rolling walker Ambulation/Gait Assistance Details: pt with slow, steady pace, decreased foot clearance but not shuffling. Gait Pattern: Step-through pattern;Decreased stride length;Narrow base of support Gait velocity: slow and steady General Gait Details: SpO2 dropped to 83% on RA during amb. Pt placed back on 2Lo2 via [place], SpO2 >95% Stairs: Yes Stairs Assistance: 4: Min Editor, commissioning Details (indicate cue type and reason): slow and cautious Stair Management Technique: One rail Right;Step to pattern (and HHA due to pt unable to reach bilat HR) Number of Stairs: 2 (to mimic home entry)    Exercises     PT Diagnosis:    PT Problem List:   PT Treatment Interventions:     PT Goals (current goals can now be found in the care plan section)    Visit Information  Last PT Received On: 06/29/13 Assistance Needed: +1 History of Present Illness: 79yo WM with a history of HTN, GERD CAD and severe AS, s/p AVR, 1 vessel CABG and RLL wedge resection of lung for lesion on 8/8 by Dr. Tyrone Sage. Admitted on 8/24 and felt to be  in acute diastolic CHF with  O2 sats 89%. PCCM asked to see on 8/27 for persistent hypoxia and pulmonary infiltrates in spite of diuretics.       Subjective Data  Subjective: pt eager for PT   Cognition  Cognition Arousal/Alertness: Awake/alert Behavior During Therapy: WFL for tasks assessed/performed Overall Cognitive Status: Within Functional Limits for tasks assessed    Balance     End of Session PT - End of  Session Equipment Utilized During Treatment: Gait belt;Oxygen Activity Tolerance: Patient tolerated treatment well Patient left: in chair;with call bell/phone within reach;with chair alarm set Nurse Communication: Mobility status (SpO2 sats during amb)   GP Functional Assessment Tool Used: Marland Kitchen   Marcene Brawn 06/29/2013, 9:06 AM  Haberman Shock, PT, DPT Pager #: [phone] Office #: [phone]

## 2013-06-29 NOTE — Progress Notes (Addendum)
[facility] DAILY ICU PROGRESS NOTE                   [address]            Joseph Garner [address]          [phone]        Total Length of Stay:  LOS: 9 days   Subjective: Looks and feels better, remains weak and deconditioned, less dyspneic  Objective: Vital signs in last 24 hours: Temp:  [97.4 F (36.3 C)-98.5 F (36.9 C)] 98.2 F (36.8 C) (09/02 0808) Pulse Rate:  [49-61] 56 (09/02 0808) Cardiac Rhythm:  [-] Sinus bradycardia;Heart block (09/02 0700) Resp:  [11-25] 14 (09/02 0808) BP: (118-154)/(50-69) 132/55 mmHg (09/02 0808) SpO2:  [87 %-100 %] 98 % (09/02 0808) Weight:  [144 lb 2.9 oz (65.4 kg)] 144 lb 2.9 oz (65.4 kg) (09/02 0500)  Filed Weights   06/27/13 0500 06/28/13 0500 06/29/13 0500  Weight: 147 lb 14.9 oz (67.1 kg) 148 lb 9.4 oz (67.4 kg) 144 lb 2.9 oz (65.4 kg)    Weight change: -4 lb 6.6 oz (-2 kg)   Hemodynamic parameters for last 24 hours:    Intake/Output from previous day: 09/01 0701 - 09/02 0700 In: 423 [P.O.:420; I.V.:3] Out: 1600 [Urine:1600]  Intake/Output this shift:    Current Meds: Scheduled Meds: . antiseptic oral rinse  15 mL Mouth Rinse BID  . aspirin EC  325 mg Oral Daily  . darifenacin  7.5 mg Oral Daily  . doxazosin  2 mg Oral QHS  . feeding supplement  1 Container Oral TID BM  . ferrous fumarate  1 tablet Oral BID  . folic acid  1 mg Oral Daily  . furosemide  60 mg Oral Daily  . heparin  5,000 Units Subcutaneous Q8H  . methylPREDNISolone (SOLU-MEDROL) injection  60 mg Intravenous Q12H  . metoprolol tartrate  12.5 mg Oral BID  . pantoprazole  40 mg Oral Daily  . potassium chloride  20 mEq Oral Daily  . sodium chloride  3 mL Intravenous Q12H   Continuous Infusions:  PRN Meds:.sodium chloride, acetaminophen, magnesium hydroxide, ondansetron (ZOFRAN) IV, sodium chloride, traMADol  General appearance: alert, cooperative and no distress Heart: regular rate and rhythm Lungs: dim in bases Abdomen: soft, non  tender Extremities: minor edema Wound: incis healing well  Lab Results: CBC: Recent Labs  06/28/13 0420  WBC 11.3*  HGB 10.5*  HCT 30.6*  PLT 241   BMET:  Recent Labs  06/28/13 0420 06/29/13 0500  NA 136 135  K 3.8 3.6  CL 95* 95*  CO2 33* 33*  GLUCOSE 124* 117*  BUN 43* 38*  CREATININE 1.13 1.06  CALCIUM 8.4 8.2*    PT/INR: No results found for this basename: LABPROT, INR,  in the last 72 hours Radiology: Dg Chest 2 View  06/28/2013   *RADIOLOGY REPORT*  Clinical Data: Weakness  CHEST - 2 VIEW  Comparison: 06/26/2013  Findings: Cardiac shadow is stable.  Persistent bilateral basilar changes are seen although some improved aeration is noted.  No new focal infiltrate is seen.  IMPRESSION: Persistent bibasilar changes although slight improvement is noted.   Original Report Authenticated By: Alcide Clever, M.D.     Assessment/Plan: 1 Steady improvement overall, cont to push rehab/nutrition 2 medical management as per IM/Cards/pulm     GOLD,WAYNE E 06/29/2013 8:56 AM  Suspect lung toxicity from Cordarone. TSH nl Severe malnutrition   I have seen and examined  Joseph Garner and agree with the above assessment  and plan.  Delight Ovens MD Beeper [phone] Office [phone] 06/29/2013 3:53 PM

## 2013-06-29 NOTE — Progress Notes (Signed)
Rehab admissions - Evaluated for possible admission.  I spoke with patient this am.  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  His daughter has returned to her home and lives 250 miles away.  I spoke with PT who feels patient can get to Mod I with rehab stay.  I talked with Dr. Riley Kill and with PA, Marissa Nestle.  Rehab team feel patient needs SNF to allow a longer, slower recovery.  Since patient does not have adequate assistance from wife at home and because he needs more recovery time, I am recommending SNF placement at this time.  He does look good this morning and he did well with therapies.  Call me for questions.  #[phone]

## 2013-06-29 NOTE — Progress Notes (Signed)
CARDIAC REHAB PHASE I   PRE:  Rate/Rhythm: 56SB  BP:  Supine:   Sitting: 142/50  Standing:    SaO2: 97%2L  MODE:  Ambulation: 350 ft   POST:  Rate/Rhythm: 71SR  BP:  Supine:   Sitting: 150/58  Standing:    SaO2: 97%2L 1300-1324 Pt walked 350 ft on 2L with rolling walker and asst x 2 with fairly steady gait. Tolerated well on oxygen, motivated to walk. To recliner after walk. Wife in room. No c/o dizziness.   Luetta Nutting, RN BSN  06/29/2013 1:21 PM

## 2013-06-29 NOTE — Progress Notes (Signed)
PULMONARY  / CRITICAL CARE MEDICINE  Name: Joseph Garner MRN: [id number] DOB: [birth year]    ADMISSION DATE:  06/20/2013 CONSULTATION DATE:  8/27  REFERRING MD :  Anne Fu PRIMARY SERVICE:  [place]  CHIEF COMPLAINT/reason for consult: hypoxia and pulmonary infiltrates   BRIEF PATIENT DESCRIPTION:  79yo WM with a history of HTN, GERD CAD and severe AS, s/p AVR, 1 vessel CABG and RLL wedge resection of lung for lesion on 8/8 by Dr. Tyrone Sage. Admitted on 8/24 and felt to be  in acute diastolic CHF with O2 sats 89%. PCCM asked to see on 8/27 for persistent hypoxia and pulmonary infiltrates in spite of diuretics.    SIGNIFICANT EVENTS / STUDIES:  ECHO 8/25: nml LV size, EF 65-70%, gd 2 diastolic dysfxn, mild PAH (PAS 39), Aortic Valve prosthesis w/ nml ROM CT chest 8/25: bilateral effusions, bilateral diffuse airspace disease.  Right thoracentesis 8/26: 500 ml No samples sent   LINES / TUBES:  CULTURES: bcx2 8/24 >> negative Urine strep and legionella antigens 8/24: both neg   ANTIBIOTICS: Cefepime 8/24>> 8/27 vanc 8/24>> 8/27  SUBJECTIVE:  No distress. No new complaints. SpO2 94% on 2 lpm [place]  VITAL SIGNS: Temp:  [97.6 F (36.4 C)-98.5 F (36.9 C)] 98 F (36.7 C) (09/02 1214) Pulse Rate:  [49-58] 58 (09/02 1214) Resp:  [12-20] 17 (09/02 1214) BP: (118-136)/(50-63) 129/57 mmHg (09/02 1214) SpO2:  [87 %-100 %] 98 % (09/02 1214) Weight:  [65.4 kg (144 lb 2.9 oz)] 65.4 kg (144 lb 2.9 oz) (09/02 0500)  PHYSICAL EXAMINATION: General:  NAD Neuro:  No focal deficits HEENT:  WNL  Cardiovascular: RRR s M Lungs:  Basilar crackles improved Abdomen:  Soft, NT, NABS Ext: trace ankle edema    Recent Labs Lab 06/27/13 0500 06/28/13 0420 06/29/13 0500  NA 130* 136 135  K 3.3* 3.8 3.6  CL 91* 95* 95*  CO2 29 33* 33*  BUN 47* 43* 38*  CREATININE 1.21 1.13 1.06  GLUCOSE 115* 124* 117*    Recent Labs Lab 06/24/13 0425 06/25/13 0438 06/28/13 0420  HGB 10.3* 10.4* 10.5*   HCT 28.2* 28.7* 30.6*  WBC 14.0* 11.0* 11.3*  PLT 282 278 241   CXR: improving bilateral infiltrates  ASSESSMENT / PLAN:  Acute hypoxic respiratory failure with bilateral infiltrates Suspect components of edema and pneumonitis Clinically improving   Plan/rec: Change methylpred to pred 60 mg/d Taper steroids slowly over 2-4 wks  Billy Fischer, MD ; [facility] service Mobile [phone].  After 5:30 PM or weekends, call [phone]

## 2013-06-29 NOTE — Progress Notes (Signed)
Awaiting pulm recs on steroids (see Dr. Donette Larry note).   Ready from my point of view for discharge to inpatient rehab.  Looks much better.

## 2013-06-30 LAB — BASIC METABOLIC PANEL
Calcium: 8 mg/dL — ABNORMAL LOW (ref 8.4–10.5)
Creatinine, Ser: 1.07 mg/dL (ref 0.50–1.35)
GFR calc Af Amer: 70 mL/min — ABNORMAL LOW (ref >90)
GFR calc non Af Amer: 60 mL/min — ABNORMAL LOW (ref >90)

## 2013-06-30 MED ORDER — POTASSIUM CHLORIDE CRYS ER 20 MEQ PO TBCR
40.0000 meq | EXTENDED_RELEASE_TABLET | Freq: Every day | ORAL | Status: DC
  Administered 2013-06-30 – 2013-07-02 (×3): 40 meq via ORAL
  Filled 2013-06-30 (×3): qty 2

## 2013-06-30 MED ORDER — POTASSIUM CHLORIDE CRYS ER 20 MEQ PO TBCR
40.0000 meq | EXTENDED_RELEASE_TABLET | Freq: Once | ORAL | Status: AC
  Administered 2013-06-30: 40 meq via ORAL
  Filled 2013-06-30: qty 2

## 2013-06-30 NOTE — Progress Notes (Signed)
PT Cancellation Note  Patient Details Name: Riaan Toledo MRN: [id number] DOB: [birth year]   Cancelled Treatment:    Reason Eval/Treat Not Completed: Fatigue/lethargy limiting ability to participate Pt just ambulated in hall 500 feet with nsg and too fatigued for therapy at this time.   Zanaya Baize,KATHrine E 06/30/2013, 9:56 AM Zenovia Jarred, PT, DPT 06/30/2013 Pager: [phone]

## 2013-06-30 NOTE — Progress Notes (Signed)
CSW received handoff from Child psychotherapist regarding patient dc plans. CSW spoke with patient and wife (at bedside) as well as daughter (over the phone)  and gave them updated bed offers. Patient's daughter stated she will look over her choices and asked to speak to CM about Home Health options. CSW referred this on to Case Manager who will follow up with daughter. CSW awaiting family to pick Home health vs SNF. CSW will update when new information arises.  Maree Krabbe, MSW, [facility] [phone]

## 2013-06-30 NOTE — Progress Notes (Addendum)
       [address]       Joseph Garner [address]             [phone]               Subjective: Feeling better.  Still has some coughing and DOE, but overall breathing improving.  No new complaints.   Objective: Vital signs in last 24 hours: Patient Vitals for the past 24 hrs:  BP Temp Temp src Pulse Resp SpO2 Weight  06/30/13 0421 149/67 mmHg 98.7 F (37.1 C) Oral 53 18 96 % 144 lb 13.5 oz (65.7 kg)  06/29/13 2100 153/75 mmHg 97.7 F (36.5 C) Oral 54 17 97 % -  06/29/13 1444 128/55 mmHg 97.6 F (36.4 C) Oral 55 16 98 % -  06/29/13 1214 129/57 mmHg 98 F (36.7 C) Oral 58 17 98 % -   Current Weight  06/30/13 144 lb 13.5 oz (65.7 kg)     Intake/Output from previous day: 09/02 0701 - 09/03 0700 In: 600 [P.O.:600] Out: 1615 [Urine:1615]    PHYSICAL EXAM:  Heart: RRR Lungs: Rare crackles in bases Wound: Clean and dry Extremities: Trace LE edema    Lab Results: CBC: Recent Labs  06/28/13 0420  WBC 11.3*  HGB 10.5*  HCT 30.6*  PLT 241   BMET:  Recent Labs  06/29/13 0500 06/30/13 0405  NA 135 135  K 3.6 3.2*  CL 95* 96  CO2 33* 31  GLUCOSE 117* 113*  BUN 38* 35*  CREATININE 1.06 1.07  CALCIUM 8.2* 8.0*    PT/INR: No results found for this basename: LABPROT, INR,  in the last 72 hours    Assessment/Plan: Stable from surgical standpoint, still quite weak. Pulmonary status slowly improving.  Down to 2L O2.  Continue current care per pulmonary, cardiology.    LOS: 10 days    COLLINS,GINA H 06/30/2013  I have seen and examined Joseph Garner and agree with the above assessment  and plan.  Delight Ovens MD Beeper [phone] Office [phone] 06/30/2013 2:56 PM

## 2013-06-30 NOTE — Progress Notes (Signed)
Patients daughter contacted CSW about possible placement at [facility]. Daughter stated she is out of town until Friday and does not want to give CSW a facility choice until she calls all of them. Daughter asked CSW to contact the MD and have MD call daughter. CSW passed this information on to MD who states he will follow up with daughter. CSW will update tomorrow.   Maree Krabbe, MSW, [facility] [phone]

## 2013-06-30 NOTE — Clinical Social Work Note (Signed)
Unit 2H CSW will sign off, and Unit 2W CSW will continue to follow for discharge planning needs. Patient's daughter has been given list of SNF bed offers received.   Rozetta Nunnery MSW, [employer] [phone]

## 2013-06-30 NOTE — Progress Notes (Signed)
CARDIAC REHAB PHASE I   PRE:  Rate/Rhythm: 66 SR  BP:  Supine:   Sitting: 142/65  Standing:    SaO2: 98%RA  MODE:  Ambulation: 550 ft   POST:  Rate/Rhythm: 108  BP:  Supine:   Sitting: 131/60  Standing:    SaO2: 90-92%RA whole walk. 1610-9604 Pt walked 550 ft on RA with rolling walker and asst x 1 with steady gait. Tolerated well. Monitored sats whole walk and never below 90%. To recliner after walk. Motivated to walk. No complaints.   Luetta Nutting, RN BSN  06/30/2013 9:52 AM

## 2013-06-30 NOTE — Care Management Note (Signed)
Spoke with pt's daughter, Tiajuana Amass by phone, and reviewed home health options with her.  [place] [facility] List to Ms. Barbera Setters at [email or web address], per her request.   Contact # for daughter: [phone].

## 2013-06-30 NOTE — Progress Notes (Signed)
Subjective:  Better. Mild SOB. No CP.   Objective:  Vital Signs in the last 24 hours: Temp:  [97.6 F (36.4 C)-98.7 F (37.1 C)] 98.7 F (37.1 C) (09/03 0421) Pulse Rate:  [53-58] 53 (09/03 0421) Resp:  [16-18] 18 (09/03 0421) BP: (128-153)/(55-75) 149/67 mmHg (09/03 0421) SpO2:  [96 %-98 %] 96 % (09/03 0421) Weight:  [65.7 kg (144 lb 13.5 oz)] 65.7 kg (144 lb 13.5 oz) (09/03 0421)  Intake/Output from previous day: 09/02 0701 - 09/03 0700 In: 600 [P.O.:600] Out: 1615 [Urine:1615]   Physical Exam: General: Well developed, well nourished, in no acute distress. Head:  Normocephalic and atraumatic. Lungs:No significant crackles. Mildly decreased bases. Heart: Normal S1 and S2.  No murmur, rubs or gallops.  Abdomen: soft, non-tender, positive bowel sounds. Extremities: No clubbing or cyanosis. No edema. Neurologic: Alert and oriented x 3.    Lab Results:  Recent Labs  06/28/13 0420  WBC 11.3*  HGB 10.5*  PLT 241    Recent Labs  06/29/13 0500 06/30/13 0405  NA 135 135  K 3.6 3.2*  CL 95* 96  CO2 33* 31  GLUCOSE 117* 113*  BUN 38* 35*  CREATININE 1.06 1.07   Telemetry: NSR, first degree AVB Personally viewed.  Cardiac Studies:  Normal EF  Assessment/Plan:  Principal Problem:   Acute and chronic respiratory failure Active Problems:   S/P AVR   S/P CABG x 1   Orthostatic hypotension   HCAP (healthcare-associated pneumonia)   Hyponatremia   Pleural effusion   CAD (coronary artery disease) of artery bypass graft   Acute respiratory failure with hypoxia   Pulmonary infiltrates   Malnutrition of moderate degree    - Await SNF, OK to DC once facility found  - replete hypokalemia  - pred taper (appreciate Pulm recs) over 2-4 wks. Now on 60.   - Weight 73.4 to 65.7 KG. Excellent diuresis.   - BP, CABG, AVR stable  SKAINS, MARK 06/30/2013, 8:37 AM

## 2013-07-01 LAB — BASIC METABOLIC PANEL
Calcium: 7.9 mg/dL — ABNORMAL LOW (ref 8.4–10.5)
GFR calc non Af Amer: 65 mL/min — ABNORMAL LOW (ref >90)
Sodium: 136 mEq/L (ref 135–145)

## 2013-07-01 MED ORDER — FUROSEMIDE 40 MG PO TABS
40.0000 mg | ORAL_TABLET | Freq: Every day | ORAL | Status: DC
  Administered 2013-07-01 – 2013-07-02 (×2): 40 mg via ORAL
  Filled 2013-07-01 (×2): qty 1

## 2013-07-01 MED ORDER — POTASSIUM CHLORIDE CRYS ER 20 MEQ PO TBCR
30.0000 meq | EXTENDED_RELEASE_TABLET | Freq: Once | ORAL | Status: AC
  Administered 2013-07-01: 30 meq via ORAL
  Filled 2013-07-01: qty 1

## 2013-07-01 NOTE — Progress Notes (Signed)
Patients wife requested to speak to CSW. CSW went to speak to patient and wife per nurses request. Patient's wife stated they went to [facility] and want to go ahead with the discharge tomorrow at [facility]. Patient's wife stated they feel that [facility] will care for patient "better" this time around. Patient's wife stated they are going to take patient to [facility] tomorrow by vehicle. CSW contacted [facility] who stated that they prefer to have the patient discharged early in the morning. CSW will update MD about dc plan.   Maree Krabbe, MSW, [facility] [phone]

## 2013-07-01 NOTE — Progress Notes (Signed)
CARDIAC REHAB PHASE I   PRE:  Rate/Rhythm: 82SR  BP:  Supine:   Sitting: 115/68  Standing:    SaO2: 96%RA  MODE:  Ambulation: 550 ft   POST:  Rate/Rhythm: 83  BP:  Supine: 131/66  Sitting:   Standing:    SaO2: 98%RA 0932-1008 Pt walked 550 ft on RA with rolling walker and asst x 1 with steady gait. Stopped once to rest. Slightly SOB during walk but sats good. To bed after walk. Tolerated well.   Luetta Nutting, RN BSN  07/01/2013 10:05 AM

## 2013-07-01 NOTE — Progress Notes (Signed)
CSW spoke with patient regarding SNF placement. Patient stated that they have not chosen a facility yet, due to his daughter being in class all day. Patient stated CSW could call daughter for an update. CSW attempted to call daughter, went to voicemail and voicemail box was full. CSW awaiting phone call back or will attempt to try again later.  Maree Krabbe, MSW, [facility] [phone]

## 2013-07-01 NOTE — Progress Notes (Addendum)
      [address]       Joseph Garner [address]             [phone]             Subjective: Patient states overall, he is feeling better but on occasion gets "short winded"  Objective: Vital signs in last 24 hours: Temp:  [97 F (36.1 C)-98.6 F (37 C)] 98.6 F (37 C) (09/04 0440) Pulse Rate:  [51-60] 60 (09/04 0440) Cardiac Rhythm:  [-] Sinus bradycardia;Heart block (09/03 2100) Resp:  [18] 18 (09/04 0440) BP: (117-144)/(56-78) 144/62 mmHg (09/04 0440) SpO2:  [94 %-97 %] 95 % (09/04 0440) Weight:  [65.7 kg (144 lb 13.5 oz)] 65.7 kg (144 lb 13.5 oz) (09/04 0440)   Current Weight  07/01/13 65.7 kg (144 lb 13.5 oz)      Intake/Output from previous day: 09/03 0701 - 09/04 0700 In: 840 [P.O.:840] Out: 2026 [Urine:2025; Stool:1]   Physical Exam:  Cardiovascular: RRR, no murmurs, gallops, or rubs. Pulmonary: Slightly diminished at bases but otherwise clear; no rales, wheezes, or rhonchi. Abdomen: Soft, non tender, bowel sounds present. Extremities: No lower extremity edema. Wound: Clean and dry.  No erythema or signs of infection.  Lab Results: CBC:No results found for this basename: WBC, HGB, HCT, PLT,  in the last 72 hours BMET:  Recent Labs  06/30/13 0405 07/01/13 0615  NA 135 136  K 3.2* 3.4*  CL 96 99  CO2 31 29  GLUCOSE 113* 82  BUN 35* 34*  CREATININE 1.07 1.01  CALCIUM 8.0* 7.9*    PT/INR:  Lab Results  Component Value Date   INR 1.32 06/20/2013   INR 1.27 06/07/2013   INR 1.55* 06/04/2013   ABG:  INR: Will add last result for INR, ABG once components are confirmed Will add last 4 CBG results once components are confirmed  Assessment/Plan:  1. CV - SR. On Lopressor 12.5 bid 2.  Pulmonary - Was on 2 liters of oxygen via [place], but has been weaned to room air. Prednisone to be tapered (per pulmonary) 3. Volume Overload - On Lasix 60 daily 4.Supplement potassium 5.Hopefully, to facility soon  ZIMMERMAN,DONIELLE  MPA-C 07/01/2013,7:58 AM  I have seen and examined [name] Life and agree with the above assessment  and plan.  Delight Ovens MD Beeper [phone] Office [phone] 07/01/2013 9:32 AM

## 2013-07-01 NOTE — Progress Notes (Addendum)
Subjective:  Spoke to daughter on phone yesterday about SNF.  Still feels weak but much improved since admit.   Objective:  Vital Signs in the last 24 hours: Temp:  [97 F (36.1 C)-98.6 F (37 C)] 98.6 F (37 C) (09/04 0440) Pulse Rate:  [51-60] 60 (09/04 0440) Resp:  [18] 18 (09/04 0440) BP: (117-144)/(56-78) 144/62 mmHg (09/04 0440) SpO2:  [94 %-97 %] 95 % (09/04 0440) Weight:  [65.7 kg (144 lb 13.5 oz)] 65.7 kg (144 lb 13.5 oz) (09/04 0440)  Intake/Output from previous day: 09/03 0701 - 09/04 0700 In: 840 [P.O.:840] Out: 2026 [Urine:2025; Stool:1]   Physical Exam: General: Well developed, well nourished, in no acute distress.Elderly Extremities: No lower extremity edema.   Lab Results: No results found for this basename: WBC, HGB, PLT,  in the last 72 hours  Recent Labs  06/30/13 0405 07/01/13 0615  NA 135 136  K 3.2* 3.4*  CL 96 99  CO2 31 29  GLUCOSE 113* 82  BUN 35* 34*  CREATININE 1.07 1.01  Telemetry: first degree avb, SR Personally viewed.  Cardiac Studies:  Normal EF  Assessment/Plan:  Principal Problem:   Acute and chronic respiratory failure Active Problems:   S/P AVR   S/P CABG x 1   Orthostatic hypotension   HCAP (healthcare-associated pneumonia)   Hyponatremia   Pleural effusion   CAD (coronary artery disease) of artery bypass graft   Acute respiratory failure with hypoxia   Pulmonary infiltrates   Malnutrition of moderate degree   - Feel comfortable with discharge once SNF chosen.   - Replete KCL  - Lasix from 60 to 40mg  QD. Excellent diuresis.  - CABG x 1, AVR -stable  - Pneumonitis/pulm edema - off amio, pred taper, pulm consult.   - nutrition    Aradia Estey 07/01/2013, 8:40 AM

## 2013-07-01 NOTE — Progress Notes (Signed)
PCCM Interval Note  Chart reviewed. Agree with the plan to taper prednisone from 60mg  to off over approximately 2 weeks. He walked on RA today without desaturation.   Please call if we can help before he is discharged  Levy Pupa, MD, PhD 07/01/2013, 12:06 PM [facility] [phone] or if no answer [phone]

## 2013-07-02 ENCOUNTER — Inpatient Hospital Stay
Admission: RE | Admit: 2013-07-02 | Discharge: 2013-08-16 | Disposition: A | Discharge status: Home / Self Care | Payer: Medicare Other | Source: Ambulatory Visit | Attending: Internal Medicine | Admitting: Internal Medicine

## 2013-07-02 DIAGNOSIS — I4892 Unspecified atrial flutter: Secondary | ICD-10-CM | POA: Diagnosis not present

## 2013-07-02 DIAGNOSIS — J449 Chronic obstructive pulmonary disease, unspecified: Secondary | ICD-10-CM | POA: Diagnosis not present

## 2013-07-02 DIAGNOSIS — I5032 Chronic diastolic (congestive) heart failure: Secondary | ICD-10-CM | POA: Diagnosis not present

## 2013-07-02 DIAGNOSIS — R262 Difficulty in walking, not elsewhere classified: Secondary | ICD-10-CM | POA: Diagnosis not present

## 2013-07-02 DIAGNOSIS — M6281 Muscle weakness (generalized): Secondary | ICD-10-CM | POA: Diagnosis not present

## 2013-07-02 DIAGNOSIS — I509 Heart failure, unspecified: Secondary | ICD-10-CM | POA: Diagnosis not present

## 2013-07-02 DIAGNOSIS — J811 Chronic pulmonary edema: Secondary | ICD-10-CM | POA: Diagnosis not present

## 2013-07-02 DIAGNOSIS — J9620 Acute and chronic respiratory failure, unspecified whether with hypoxia or hypercapnia: Secondary | ICD-10-CM | POA: Diagnosis not present

## 2013-07-02 DIAGNOSIS — R279 Unspecified lack of coordination: Secondary | ICD-10-CM | POA: Diagnosis not present

## 2013-07-02 DIAGNOSIS — Z954 Presence of other heart-valve replacement: Secondary | ICD-10-CM | POA: Diagnosis not present

## 2013-07-02 DIAGNOSIS — E871 Hypo-osmolality and hyponatremia: Secondary | ICD-10-CM | POA: Diagnosis not present

## 2013-07-02 DIAGNOSIS — Z951 Presence of aortocoronary bypass graft: Secondary | ICD-10-CM | POA: Diagnosis not present

## 2013-07-02 DIAGNOSIS — R41841 Cognitive communication deficit: Secondary | ICD-10-CM | POA: Diagnosis not present

## 2013-07-02 DIAGNOSIS — J708 Respiratory conditions due to other specified external agents: Secondary | ICD-10-CM | POA: Diagnosis not present

## 2013-07-02 DIAGNOSIS — I959 Hypotension, unspecified: Secondary | ICD-10-CM | POA: Diagnosis not present

## 2013-07-02 DIAGNOSIS — N390 Urinary tract infection, site not specified: Secondary | ICD-10-CM | POA: Diagnosis not present

## 2013-07-02 DIAGNOSIS — J189 Pneumonia, unspecified organism: Principal | ICD-10-CM

## 2013-07-02 DIAGNOSIS — I25810 Atherosclerosis of coronary artery bypass graft(s) without angina pectoris: Secondary | ICD-10-CM | POA: Diagnosis not present

## 2013-07-02 DIAGNOSIS — J90 Pleural effusion, not elsewhere classified: Secondary | ICD-10-CM | POA: Diagnosis not present

## 2013-07-02 DIAGNOSIS — T466X5A Adverse effect of antihyperlipidemic and antiarteriosclerotic drugs, initial encounter: Secondary | ICD-10-CM | POA: Diagnosis not present

## 2013-07-02 DIAGNOSIS — D649 Anemia, unspecified: Secondary | ICD-10-CM | POA: Diagnosis not present

## 2013-07-02 DIAGNOSIS — I5031 Acute diastolic (congestive) heart failure: Secondary | ICD-10-CM | POA: Diagnosis not present

## 2013-07-02 DIAGNOSIS — R5381 Other malaise: Secondary | ICD-10-CM | POA: Diagnosis not present

## 2013-07-02 DIAGNOSIS — R0602 Shortness of breath: Secondary | ICD-10-CM | POA: Diagnosis not present

## 2013-07-02 DIAGNOSIS — I471 Supraventricular tachycardia: Secondary | ICD-10-CM | POA: Diagnosis not present

## 2013-07-02 DIAGNOSIS — I951 Orthostatic hypotension: Secondary | ICD-10-CM | POA: Diagnosis not present

## 2013-07-02 DIAGNOSIS — I5022 Chronic systolic (congestive) heart failure: Secondary | ICD-10-CM | POA: Diagnosis not present

## 2013-07-02 DIAGNOSIS — I2510 Atherosclerotic heart disease of native coronary artery without angina pectoris: Secondary | ICD-10-CM | POA: Diagnosis not present

## 2013-07-02 DIAGNOSIS — Q230 Congenital stenosis of aortic valve: Secondary | ICD-10-CM | POA: Diagnosis not present

## 2013-07-02 DIAGNOSIS — I5030 Unspecified diastolic (congestive) heart failure: Secondary | ICD-10-CM | POA: Diagnosis not present

## 2013-07-02 LAB — BASIC METABOLIC PANEL
BUN: 26 mg/dL — ABNORMAL HIGH (ref 6–23)
Calcium: 7.7 mg/dL — ABNORMAL LOW (ref 8.4–10.5)
GFR calc Af Amer: 83 mL/min — ABNORMAL LOW (ref >90)
GFR calc non Af Amer: 71 mL/min — ABNORMAL LOW (ref >90)
Potassium: 3.8 mEq/L (ref 3.5–5.1)
Sodium: 132 mEq/L — ABNORMAL LOW (ref 135–145)

## 2013-07-02 MED ORDER — PREDNISONE 20 MG PO TABS: 40.0000 mg | ORAL_TABLET | Freq: Every day | ORAL | Status: DC

## 2013-07-02 MED ORDER — POTASSIUM CHLORIDE CRYS ER 20 MEQ PO TBCR: 40.0000 meq | EXTENDED_RELEASE_TABLET | Freq: Every day | ORAL | Status: DC

## 2013-07-02 MED ORDER — POTASSIUM CHLORIDE CRYS ER 20 MEQ PO TBCR
20.0000 meq | EXTENDED_RELEASE_TABLET | Freq: Once | ORAL | Status: AC
  Administered 2013-07-02: 20 meq via ORAL
  Filled 2013-07-02: qty 1

## 2013-07-02 MED ORDER — MAGNESIUM HYDROXIDE 400 MG/5ML PO SUSP: 15.0000 mL | Freq: Every day | ORAL | Status: DC | PRN

## 2013-07-02 MED ORDER — PREDNISONE 20 MG PO TABS
40.0000 mg | ORAL_TABLET | Freq: Every day | ORAL | Status: DC
  Filled 2013-07-02: qty 2

## 2013-07-02 MED ORDER — BOOST / RESOURCE BREEZE PO LIQD: 1.0000 | Freq: Three times a day (TID) | ORAL | Status: DC

## 2013-07-02 NOTE — Progress Notes (Signed)
Clinical Social Work Department CLINICAL SOCIAL WORK PLACEMENT NOTE 07/02/2013  Patient:  Joseph Garner, Joseph Garner  Account Number:  [id number] Admit date:  06/20/2013  Clinical Social Worker:  Hulan Fray  Date/time:  06/23/2013 03:37 PM  Clinical Social Work is seeking post-discharge placement for this patient at the following level of care:   SKILLED NURSING   (*CSW will update this form in Epic as items are completed)   06/23/2013  Patient/family provided with [facility] Department of Clinical Social Work's list of facilities offering this level of care within the geographic area requested by the patient (or if unable, by the patient's family).  06/23/2013  Patient/family informed of their freedom to choose among providers that offer the needed level of care, that participate in Medicare, Medicaid or managed care program needed by the patient, have an available bed and are willing to accept the patient.  06/23/2013  Patient/family informed of [facility]' ownership interest in [facility], as well as of the fact that they are under no obligation to receive care at this facility.  PASARR submitted to EDS on 06/04/2013 PASARR number received from EDS on 06/04/2013  FL2 transmitted to all facilities in geographic area requested by pt/family on  06/23/2013 FL2 transmitted to all facilities within larger geographic area on   Patient informed that his/her managed care company has contracts with or will negotiate with  certain facilities, including the following:     Patient/family informed of bed offers received:  06/30/2013 Patient chooses bed at [facility] Physician recommends and patient chooses bed at    Patient to be transferred to [facility] on  07/02/2013 Patient to be transferred to facility by Family  The following physician request were entered in Epic:   Additional Comments:   Maree Krabbe, MSW, [employer] [phone]

## 2013-07-02 NOTE — Progress Notes (Signed)
Clinical Social Worker facilitated patient discharge by contacting the patient, family and facility, [facility]. Patient agreeable to this plan and arranging transport via family . CSW will sign off, as social work intervention is no longer needed.  Jeb Schloemer, MSW, LCSWA [phone] 

## 2013-07-02 NOTE — Progress Notes (Signed)
Physical Therapy Treatment Patient Details Name: Joseph Garner MRN: [id number] DOB: 1924/05/19 Today's Date: 07/02/2013 Time: 1191-4782 PT Time Calculation (min): 26 min  PT Assessment / Plan / Recommendation  History of Present Illness 79yo WM with a history of HTN, GERD CAD and severe AS, s/p AVR, 1 vessel CABG and RLL wedge resection of lung for lesion on 8/8 by Dr. Tyrone Sage. Admitted on 8/24 and felt to be  in acute diastolic CHF with O2 sats 89%. PCCM asked to see on 8/27 for persistent hypoxia and pulmonary infiltrates in spite of diuretics.      PT Comments   Pt mobilizing well and ready for DC to SNF with family present. Pt with excellent progress and pt desire SNF for short term but pt could progress at home with assist. Educated pt and family for HEP and provided handout as well as encouraged daily ambulation and maintenance of sternal precautions until cleared by surgeon.  Follow Up Recommendations  Home health PT;Supervision for mobility/OOB     Does the patient have the potential to tolerate intense rehabilitation     Barriers to Discharge        Equipment Recommendations       Recommendations for Other Services    Frequency     Progress towards PT Goals Progress towards PT goals: Progressing toward goals  Plan Discharge plan needs to be updated    Precautions / Restrictions Precautions Precautions: Fall;Sternal Restrictions Weight Bearing Restrictions: No   Pertinent Vitals/Pain No pain HR 69 with sats 98% on RA    Mobility  Bed Mobility Bed Mobility: Not assessed Transfers Sit to Stand: 6: Modified independent (Device/Increase time);From bed Stand to Sit: To bed;6: Modified independent (Device/Increase time) Ambulation/Gait Ambulation/Gait Assistance: 5: Supervision Ambulation Distance (Feet): 350 Feet Assistive device: Rolling walker Ambulation/Gait Assistance Details: cueing to increase stride length. steady gait. 15' without RW with shorter cautious  steps  Gait Pattern: Step-through pattern;Decreased stride length Gait velocity: slow and steady Stairs: Yes Stairs Assistance: 5: Supervision Stair Management Technique: One rail Right;Step to pattern Number of Stairs: 3    Exercises     PT Diagnosis:    PT Problem List:   PT Treatment Interventions:     PT Goals (current goals can now be found in the care plan section)    Visit Information  Last PT Received On: 07/02/13 Assistance Needed: +1 History of Present Illness: 79yo WM with a history of HTN, GERD CAD and severe AS, s/p AVR, 1 vessel CABG and RLL wedge resection of lung for lesion on 8/8 by Dr. Tyrone Sage. Admitted on 8/24 and felt to be  in acute diastolic CHF with O2 sats 89%. PCCM asked to see on 8/27 for persistent hypoxia and pulmonary infiltrates in spite of diuretics.       Subjective Data      Cognition  Cognition Arousal/Alertness: Awake/alert Behavior During Therapy: WFL for tasks assessed/performed Overall Cognitive Status: Within Functional Limits for tasks assessed    Balance     End of Session PT - End of Session Activity Tolerance: Patient tolerated treatment well Patient left: in bed;with call bell/phone within reach;with family/visitor present Nurse Communication: Mobility status   GP     Toney Sang [facility] 07/02/2013, 10:33 AM Delaney Meigs, PT [phone]

## 2013-07-02 NOTE — Progress Notes (Signed)
Pt/family given discharge instructions, medication lists, follow up appointments, and when to call the doctor.  Pt/family verbalizes understanding. Pt being transported to [facility] via private vehicle.  Will call [facility] and give report. Thomas Hoff

## 2013-07-02 NOTE — Discharge Summary (Signed)
Patient ID: Joseph Garner MRN: [id number] DOB/AGE: 1924-03-14 79 y.o.  Admit date: 06/20/2013 Discharge date: 07/02/2013  Primary Discharge Diagnosis: Acute respiratory failure/pneumonitis Secondary Discharge Diagnosis: Acute diastolic heart failure, hyponatremia, moderate malnutrition, hypokalemia, coronary artery disease status post bypass x1, aortic valve disease status post aortic valve replacement bioprosthetic, hypertension, hyperlipidemia, deconditioning, first degree AV block  Significant Diagnostic Studies: Multiple chest x-rays originally demonstrating bilateral pulmonary infiltrates, significant right pleural effusion status post thoracentesis. Echocardiogram demonstrated normal ejection fraction with diastolic dysfunction, normal functioning bioprosthetic aortic valve.  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 Tyrone Sage who was discharged postoperatively to [facility] rehabilitation where he developed increasing shortness of breath, hypoxia requiring readmission to CCU with facemask oxygen, non re breather.  Bilateral pulmonary infiltrates as well as moderate-sized pleural effusions were noted, right greater than left. Thoracentesis was performed which evacuated 500 mL of serous sanguinous fluid. After thoracentesis, clinically he did not improve. His bilateral pulmonary infiltrates worsened despite initial aggressive diuresis. Amiodarone which was used postoperatively for atrial flutter was therefore discontinued. IV Solu-Medrol/steroids were utilized for treatment of possible pneumonitis. IV diuresis continued. Initially IV antibiotics were utilized at these were shortly discontinued after no evidence of fever or other subjective evidence of pneumonia persisted.  Over the next several days,  his clinical status improved and eventually, nasal cannula oxygen and discontinuation of oxygen was achieved.  In total, 16 L was removed via diuresis with Lasix. Lasix dosage was reduced to 40 mg once a day.  Hypokalemia was corrected and he will continue with potassium 40 mEq daily.  Hyponatremia was also noted and with diuresis as well as fluid restriction, his sodium increased to a range of 136-132.   Physical therapy worked with him and inpatient rehabilitation consultation was obtained. They recommended outpatient skilled nursing facility.  We had a lengthy conversation with him as well as his daughter. Joseph Garner was most convenient to his wife/family.   Prednisone taper will continue as prescribed.  Amiodarone has been discontinued and will no longer be utilized.  There has been no evidence of atrial flutter throughout this admission. He does however have first degree AV block.  Originally on admission, hemoglobin 8.4. He was transfused. BNP was as high as 4300. TSH was normal. A.m. cortisol was normal. Blood cultures were normal. CT of chest showed no evidence of pulmonary embolism.  He has made a remarkable improvement over the last several days. He is ready for skilled nursing facility. He knows to contact us if any clinical change occurs.   Discharge Exam: Blood pressure 129/75, pulse 58, temperature 98.3 F (36.8 C), temperature source Oral, resp. rate 18, height 5\' 8"  (1.727 m), weight 65.4 kg (144 lb 2.9 oz), SpO2 97.00%.    General: Elderly, in no acute distress, sitting up comfortably eating breakfast with no oxygen  Lungs: Clear to auscultation bilaterally, no wheezes minimally decreased breath sounds right lower lobe  Cardiovascular: Regular rate and rhythm, soft systolic murmur, no JVD  Abdomen: Soft, nontender, normal bowel sounds  Extremities: No edema.   Labs:   Lab Results  Component Value Date   WBC 11.3* 06/28/2013   HGB 10.5* 06/28/2013   HCT 30.6*  06/28/2013   MCV 91.1 06/28/2013   PLT 241 06/28/2013    Recent Labs Lab 07/02/13 0515  NA 132*  K 3.8  CL 97  CO2 29  BUN 26*  CREATININE 0.99  CALCIUM 7.7*  GLUCOSE 85   Lab Results  Component Value Date   CKTOTAL 61 01/19/2009   CKMB 1.3 01/19/2009   TROPONINI <0.30 06/21/2013    Lab Results  Component Value Date   CHOL 120 06/04/2013   CHOL  Value: 148        ATP III CLASSIFICATION:  <200     mg/dL   Desirable  161-096  mg/dL   Borderline High  >=045    mg/dL   High        02/03/8118   Lab Results  Component Value Date   HDL 50 06/04/2013   HDL 44 01/19/2009   Lab Results  Component Value Date   LDLCALC 60 06/04/2013   LDLCALC  Value: 96        Total Cholesterol/HDL:CHD Risk Coronary Heart Disease Risk Table                     Men   Women  1/2 Average Risk   3.4   3.3  Average Risk       5.0   4.4  2 X Average Risk   9.6   7.1  3 X Average Risk  23.4   11.0        Use the calculated Patient Ratio above and the CHD Risk Table to determine the patient's CHD Risk.        ATP III CLASSIFICATION (LDL):  <100     mg/dL   Optimal  147-829  mg/dL   Near or Above                    Optimal  130-159  mg/dL   Borderline  562-130  mg/dL   High  >865     mg/dL   Very High 7/84/6962   Lab Results  Component Value Date   TRIG 51 06/04/2013   TRIG 39 01/19/2009   Lab Results  Component Value Date   CHOLHDL 2.4 06/04/2013   CHOLHDL 3.4 01/19/2009   No results found for this basename: LDLDIRECT    FOLLOW UP PLANS AND APPOINTMENTS Discharge Orders   Future Appointments Provider Department Dept Phone   07/22/2013 3:00 PM Delight Ovens, MD [facility] [phone]   Future Orders Complete By Expires   Diet - low sodium heart healthy  As directed    Increase activity slowly  As directed        Medication List    STOP taking these medications       amiodarone 200 MG tablet  Commonly known as:  PACERONE     doxazosin 4 MG tablet  Commonly known  as:  CARDURA      TAKE these medications       aspirin 325 MG EC tablet  Take 1 tablet (325 mg total) by mouth daily.     atorvastatin 20 MG tablet  Commonly known as:  LIPITOR  Take 1 tablet (20 mg total) by mouth daily at 6 PM.     feeding supplement Liqd  Take 1 Container by mouth 3 (three) times daily between meals.     ferrous fumarate 325 (106 FE) MG Tabs tablet  Commonly known as:  HEMOCYTE - 106 mg FE  Take 1 tablet (106 mg of iron total) by mouth 2 (two) times daily.     fluticasone 50 MCG/ACT nasal spray  Commonly known as:  FLONASE  Place 2 sprays into the nose daily as needed for allergies.     folic acid 1 MG tablet  Commonly known as:  FOLVITE  Take 1 tablet (1 mg total) by mouth daily.     furosemide 40 MG tablet  Commonly known as:  LASIX  Take 1 tablet (40 mg total) by mouth daily.     loratadine 10 MG tablet  Commonly known as:  CLARITIN  Take 10 mg by mouth daily as needed for allergies.     magnesium hydroxide 400 MG/5ML suspension  Commonly known as:  MILK OF MAGNESIA  Take 15 mLs by mouth daily as needed.     metoprolol tartrate 25 MG tablet  Commonly known as:  LOPRESSOR  Take 12.5 mg by mouth 2 (two) times daily.     multivitamin with minerals Tabs tablet  Take 1 tablet by mouth daily.     potassium chloride SA 20 MEQ tablet  Commonly known as:  K-DUR,KLOR-CON  Take 2 tablets (40 mEq total) by mouth daily.     predniSONE 20 MG tablet  Commonly known as:  DELTASONE  Take 2 tablets (40 mg total) by mouth daily with breakfast.  Start taking on:  07/03/2013     PRESERVISION AREDS PO  Take 2 capsules by mouth daily.     PRILOSEC OTC 20 MG tablet  Generic drug:  omeprazole  Take 20 mg by mouth daily.     solifenacin 5 MG tablet  Commonly known as:  VESICARE  Take 5 mg by mouth daily.     traMADol 50 MG tablet  Commonly known as:  ULTRAM  Take 1 tablet (50 mg total) by mouth every 6 (six) hours as needed.           Follow-up  Information   Follow up with GERHARDT,EDWARD B, MD. (PA/LAT CXR to be taken (at [facility] which is in the same building as Dr. Dennie Maizes office) on 9/25/20014 at 2:00 pm;Appointment with Dr. Tyrone Sage is on 07/22/2013 at 3:00 pm)    Specialty:  Cardiothoracic Surgery   Contact information:   [address] [employer] [address] [place] [address] [phone]       Follow up with Donato Schultz, MD. Schedule an appointment as soon as possible for a visit in 1 week.   Specialty:  Cardiology   Contact information:   [address] [place] [address] [phone]       BRING ALL MEDICATIONS WITH YOU TO FOLLOW UP APPOINTMENTS  Time spent with patient to include physician time: 35 minutes spent with patient, instructions, review of medical records, discussion with cardiothoracic surgery team.  Signed: Donato Schultz 07/02/2013, 7:50 AM

## 2013-07-02 NOTE — Progress Notes (Signed)
      [address]       [name] [address]             [phone]             Subjective: Patient eating breakfast. He feels better everyday.  Objective: Vital signs in last 24 hours: Temp:  [98.3 F (36.8 C)-98.5 F (36.9 C)] 98.3 F (36.8 C) (09/05 0418) Pulse Rate:  [58-64] 58 (09/05 0418) Cardiac Rhythm:  [-] Sinus bradycardia;Heart block (09/04 2030) Resp:  [18] 18 (09/05 0418) BP: (124-129)/(68-75) 129/75 mmHg (09/05 0418) SpO2:  [96 %-97 %] 97 % (09/05 0418) Weight:  [65.4 kg (144 lb 2.9 oz)] 65.4 kg (144 lb 2.9 oz) (09/05 0418)   Current Weight  07/02/13 65.4 kg (144 lb 2.9 oz)      Intake/Output from previous day: 09/04 0701 - 09/05 0700 In: 1180 [P.O.:940; I.V.:240] Out: 1376 [Urine:1375; Stool:1]   Physical Exam:  Cardiovascular: RRR, no murmurs, gallops, or rubs. Pulmonary: Slightly diminished at bases but otherwise clear; no rales, wheezes, or rhonchi. Abdomen: Soft, non tender, bowel sounds present. Extremities: No lower extremity edema. Wound: Clean and dry.  No erythema or signs of infection.  Lab Results: CBC:No results found for this basename: WBC, HGB, HCT, PLT,  in the last 72 hours BMET:   Recent Labs  07/01/13 0615 07/02/13 0515  NA 136 132*  K 3.4* 3.8  CL 99 97  CO2 29 29  GLUCOSE 82 85  BUN 34* 26*  CREATININE 1.01 0.99  CALCIUM 7.9* 7.7*    PT/INR:  Lab Results  Component Value Date   INR 1.32 06/20/2013   INR 1.27 06/07/2013   INR 1.55* 06/04/2013   ABG:  INR: Will add last result for INR, ABG once components are confirmed Will add last 4 CBG results once components are confirmed  Assessment/Plan:  1. CV - SR. On Lopressor 12.5 bid 2.  Pulmonary - Has been on room air with oxygenation 98%.On Prednisone and to be tapered  3. Volume Overload - On Lasix 40 daily 4.Supplement potassium 5.To [facility] today  Janece Laidlaw MPA-C 07/02/2013,7:32 AM

## 2013-07-05 ENCOUNTER — Other Ambulatory Visit: Payer: Self-pay | Admitting: *Deleted

## 2013-07-05 ENCOUNTER — Ambulatory Visit (HOSPITAL_COMMUNITY)
Admit: 2013-07-05 | Discharge: 2013-07-05 | Disposition: A | Discharge status: Home / Self Care | Payer: Medicare Other | Attending: Internal Medicine | Admitting: Internal Medicine

## 2013-07-05 ENCOUNTER — Ambulatory Visit: Payer: Medicare Other

## 2013-07-05 DIAGNOSIS — R05 Cough: Secondary | ICD-10-CM | POA: Insufficient documentation

## 2013-07-05 DIAGNOSIS — J811 Chronic pulmonary edema: Secondary | ICD-10-CM | POA: Diagnosis not present

## 2013-07-05 DIAGNOSIS — R059 Cough, unspecified: Secondary | ICD-10-CM | POA: Insufficient documentation

## 2013-07-05 MED ORDER — TRAMADOL HCL 50 MG PO TABS: 50.0000 mg | ORAL_TABLET | Freq: Four times a day (QID) | ORAL | Status: DC | PRN

## 2013-07-06 ENCOUNTER — Non-Acute Institutional Stay (SKILLED_NURSING_FACILITY): Payer: Medicare Other | Admitting: Internal Medicine

## 2013-07-06 DIAGNOSIS — I5031 Acute diastolic (congestive) heart failure: Secondary | ICD-10-CM | POA: Diagnosis not present

## 2013-07-06 DIAGNOSIS — T466X5A Adverse effect of antihyperlipidemic and antiarteriosclerotic drugs, initial encounter: Secondary | ICD-10-CM

## 2013-07-06 DIAGNOSIS — J708 Respiratory conditions due to other specified external agents: Secondary | ICD-10-CM

## 2013-07-06 DIAGNOSIS — I4892 Unspecified atrial flutter: Secondary | ICD-10-CM

## 2013-07-07 ENCOUNTER — Non-Acute Institutional Stay (SKILLED_NURSING_FACILITY): Payer: Medicare Other | Admitting: Internal Medicine

## 2013-07-07 DIAGNOSIS — I509 Heart failure, unspecified: Secondary | ICD-10-CM

## 2013-07-07 DIAGNOSIS — I25810 Atherosclerosis of coronary artery bypass graft(s) without angina pectoris: Secondary | ICD-10-CM

## 2013-07-07 DIAGNOSIS — N390 Urinary tract infection, site not specified: Secondary | ICD-10-CM | POA: Diagnosis not present

## 2013-07-07 NOTE — Progress Notes (Signed)
Patient ID: Joseph Garner, male   DOB: Dec 05, 1923, 79 y.o.   MRN: [id number] Facility; [facility] Chief complaint; review of cardiac status and dysuria History; Joseph Garner is a gentleman that I just met the other day. He had been in the facility very temporally after undergoing aortic valve replacement with a bioprosthetic valve and a CABG x1. 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 He returned to the facility and I saw him for the first time 2 days ago. Since that time he complains of dyspnea on exertion which forces him to stop before he can resume his walking. He does not clearly describe chest pain although I am worried that this represents some form of anginal equivalent. The other issue appears to be that he is complaining of extreme urinary frequency with some dysuria.  Lab work from this morning showed a white count of 13.9 normal differential hemoglobin is 12.5 sodium 132 potassium 3.8 BUN 22 creatinine 1.22  Review of system Respiratory; exertional shortness of breath, no cough Cardiac; the differential between exertional shortness of breath and an anginal equivalent here is somewhat difficult however I am concerned GI no diarrhea GU urinary frequency with some dysuria is noted no nausea or vomiting  Physical examination Weight 137 pounds, blood pressure 138/70, respirations 18, pulse 71, temperature 98.2, O2 sat 96% on room air Gen. patient is not in overt medical distress Respiratory shallow but otherwise clear entry bilaterally there is no wheezing no crackles Cardiac; his heart sounds are soft. I think there is a soft S4 midsystolic murmur and somewhat accentuated S2. His JVP is not elevated there is trivial lower extremity no evidence of a DVT GU; I see no evidence of urinary retention at the bedside however he does have suprapubic tenderness without CVA tenderness  Impression/plan #1 I find  his exertional complaint is somewhat concerning. Further I believe he has a soft S4. He has a followup with his cardiologist on Friday. He is on aspirin 325, Lipitor 20, Lasix 40 mg, metoprolol 12.5 twice a day, KCl 40 mEq daily, he remains on prednisone 40 mg daily. I am going to increase his beta blocker and add Imdur. #2 question amiodarone induced pulmonary infiltrates. He remains on prednisone 40 a day. I am going to repeat his chest x-ray. #3 CHF was aggressively diuresed in the hospital. I don't see firm evidence of this at the bedside. His weights in the facility are really difficult to follow. From his prior admission he was 168 on August 23. When he returned to the facility on September 5 he was 147 pounds. His current weight is 137 pounds therefore we appear to be gradually diuresis and him successfully. I don't feel that he needs an increase in his diuretic. #4 UTI. I think there is enough to justify empiric antibiotics will await a urine culture. We'll likely start him on cipro  My major concern here is the exertional shortness of breath which seems to stop him having to recover before starting over again. Some of this is no doubt prolonged illness and recumbency however the S4 makes me think that this might represent an anginal equivalent. OC if I can move his cardiology followup up for tomorrow. I am going to increase his beta blocker add Imdur. PA and lateral chest x-ray will be ordered as well as a BNP

## 2013-07-08 ENCOUNTER — Non-Acute Institutional Stay (SKILLED_NURSING_FACILITY): Payer: Medicare Other | Admitting: Internal Medicine

## 2013-07-08 DIAGNOSIS — J90 Pleural effusion, not elsewhere classified: Secondary | ICD-10-CM | POA: Diagnosis not present

## 2013-07-08 DIAGNOSIS — I5030 Unspecified diastolic (congestive) heart failure: Secondary | ICD-10-CM | POA: Diagnosis not present

## 2013-07-08 DIAGNOSIS — I471 Supraventricular tachycardia: Secondary | ICD-10-CM | POA: Diagnosis not present

## 2013-07-08 DIAGNOSIS — J9620 Acute and chronic respiratory failure, unspecified whether with hypoxia or hypercapnia: Secondary | ICD-10-CM

## 2013-07-08 DIAGNOSIS — J189 Pneumonia, unspecified organism: Secondary | ICD-10-CM | POA: Diagnosis not present

## 2013-07-08 DIAGNOSIS — I509 Heart failure, unspecified: Secondary | ICD-10-CM | POA: Diagnosis not present

## 2013-07-08 DIAGNOSIS — Z954 Presence of other heart-valve replacement: Secondary | ICD-10-CM | POA: Diagnosis not present

## 2013-07-08 DIAGNOSIS — I5031 Acute diastolic (congestive) heart failure: Secondary | ICD-10-CM

## 2013-07-08 LAB — GLUCOSE, CAPILLARY: Glucose-Capillary: 92 mg/dL (ref 70–99)

## 2013-07-08 NOTE — Progress Notes (Signed)
Patient ID: Joseph Garner, male   DOB: 08/05/1924, 79 y.o.   MRN: [id number]  This is an acute visit.  Level care skilled.  Facility [facility].  Chief complaint- acute visit followup CHF and pneumonitis.  History of present illness.  Patient is a pleasant 79 year old male with recent history of a bioprosthetic aortic valve replacement-he was readmitted to the hospital with acute diastolic heart failure pneumonitis with a mean of around discontinuation.  He had significant diuresis in the hospital as well and improved.  He was seen by Dr. Leanord Hawking yesterday and there was concern with some exertional dyspnea-and thus cardiology follow up was pursued today.  Patient was started on Imdur and metoprolol was increased yesterday this was followed up by cardiology today.  Of note a BNP was also drawn which was elevated at 2474-however compared with previous values this appears relatively baseline and actually week ago BNP was over 4000.  He also had a chest x-ray done on September 8 which she'll regress to pulmonary edema and improved bilateral ventilation.  Patient says he feels okay today is not complaining of dyspnea although he says he did not do an exercise in therapy today which required significant effort apparently yesterday it was somewhat more strenuous which may explain the exertional dyspnea.  His vital signs continued to be stable his weight is relatively stable hovering in the higher 130s.  He does not complaining of any chest pain and appears to be comfortable sitting in his wheelchair actually was talking on the phone when I entered the room.  . Family medical social history has been reviewed per discharge note on 07/02/2013.  Medications have been reviewed per MAR.  Review of systems.  General denies any fever or chills.  Respiratory-does not complaining of shortness of breath-apparently he has not had much exertion today.  Cardiac-does not complaining of chest pain or  palpitations.  GI no complaints of abdominal pain nausea or vomiting.  Muscle skeletal has significant weakness but does not complaining of pain today.  Neurologic no complaints of headache or dizziness.  Physical exam.  Temperature is 98.1 pulse 72 respirations 20 blood pressure 139/82 weight is relatively stable at 139.  In general this is a frail elderly male in no distress sitting comfortably in his wheelchair.  Skin is warm and dry.  Neck-it did not appreciate any JVD.  Heart is regular rate and rhythm without murmur gallop or rub.  He does have a well-healed mid chest scar.  Chest is clear to auscultation without rhonchi rales wheezes or any labored breathing.  The abdomen is soft nontender with active bowel sounds.  Edema he has some minimal pedal edema.  Labs.  As stated in history of present illness.  BNP 2474.  07/02/2013.  Sodium 132 potassium 3.8 BUN 26 creatinine 0.99.  06/28/2013.  WBC 11.3 hemoglobin 10.5 platelets 241.  Assessment and plan.  #1-CHF--with history of pneumonitis-per cardiology this appears to be stable he is maintaining patient on discharge dose of metoprolol 12.5 twice a day-with orders to monitor weights closely clinical status-at this point he appears to be stable--- he continues on Lasix 40 mg a day as well as potassium Chest x-ray actually showed improvement BNP appears to be at baseline.  Update metabolic panel next week  [id number]

## 2013-07-09 LAB — GLUCOSE, CAPILLARY: Glucose-Capillary: 90 mg/dL (ref 70–99)

## 2013-07-12 ENCOUNTER — Non-Acute Institutional Stay (SKILLED_NURSING_FACILITY): Payer: Medicare Other | Admitting: Internal Medicine

## 2013-07-12 DIAGNOSIS — J708 Respiratory conditions due to other specified external agents: Secondary | ICD-10-CM

## 2013-07-12 DIAGNOSIS — T466X5A Adverse effect of antihyperlipidemic and antiarteriosclerotic drugs, initial encounter: Secondary | ICD-10-CM | POA: Diagnosis not present

## 2013-07-12 DIAGNOSIS — I25810 Atherosclerosis of coronary artery bypass graft(s) without angina pectoris: Secondary | ICD-10-CM

## 2013-07-12 LAB — GLUCOSE, CAPILLARY: Glucose-Capillary: 96 mg/dL (ref 70–99)

## 2013-07-12 NOTE — Progress Notes (Signed)
Patient ID: Joseph Garner, male   DOB: 03/17/1924, 79 y.o.   MRN: [id number]           HISTORY & PHYSICAL  DATE:  07/05/2013  FACILITY: [facility]   LEVEL OF CARE:   SNF   CHIEF COMPLAINT:  This is an admission to SNF, post stay at [facility], 06/20/2013 through 07/02/2013.    HISTORY OF PRESENT ILLNESS:  Joseph Garner is a gentleman who was here earlier in August for a short stay.  At that point, I believe he had been in hospital status post aortic valve replacement and a single-vessel bypass as well as a right lower lobe resection of the lung for hamartoma.  He was here for a short period of time where he became increasingly dyspneic.  I did not see him on that admission.  He was transferred back to hospital.    In the hospital, he was discovered to have bilateral pulmonary infiltrates as well as a moderate sized pleural effusion, right greater than left.  Thoracentesis was performed with 500 mL of serosanguineous fluid removed.   He did not improve after thoracentesis.  His bilateral pulmonary infiltrates worsened despite aggressive diuresis.  Amiodarone, which was used postoperatively for atrial flutter, was therefore discontinued.    He was given IV Solu-Medrol for the possibility of amiodarone-induced pneumonitis.  IV diuresis continued and a total of 16 L were removed.  He had diuresis with Lasix and his Lasix dosage was decreased to 40 mg once a day.  He was also noted to have hypokalemia and hyponatremia.    PAST MEDICAL HISTORY/PROBLEM LIST:  Acute diastolic heart failure.   Hyponatremia.    Moderate malnutrition.    Hypokalemia.    Coronary artery disease, status post aortic valve bypass with a bioprosthetic valve, status post replacement and CABG x1.    Hypertension.    Hyperlipidemia.    First-degree AV block.    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    Right pleural effusion, status post thoracentesis.    CURRENT MEDICATIONS:  Medication list is reviewed.     ASA 325 q.d.    Atorvastatin 20 q.d.    Ferrous fumarate 325, 1 tablet b.i.d.    Flonase 10 sprays into the nostril as needed for allergies.    Folic acid 1 mg daily.    Lasix 40 daily.    Claritin 10 daily.    Magnesium hydroxide 400 mg/5 mL suspension, 15 mL by mouth daily.      Lopressor 1/2 tablet, 12.5 b.i.d.    Potassium 40 mEq daily.    Prednisone 2 tablets, 40 mg by mouth daily with breakfast.    Prilosec OTC 20 mg daily.    VESIcare 5 mg daily.    Tramadol (Ultram) 1 tablet, 50 mg q.6 h p.r.n.    SOCIAL HISTORY: HOUSING:  The patient lives in [place] with his wife.   FUNCTIONAL STATUS:  I am not totally certain of his premorbid functional status, although I believe he has been in hospital for a month now.    REVIEW OF SYSTEMS:   GENERAL:  According to the three days of weights here, we have a weight of 147.4 lbs on 07/02/2013, 144.1 lbs on 07/03/2013, and 130 lbs on 07/04/2013 which would represent a loss of 17 lbs if this is accurate.   CHEST/RESPIRATORY:  Does not complain of cough or shortness of breath.   CARDIAC:   No chest pain or palpitations.   GI:  No  nausea, vomiting, abdominal pain, or diarrhea.   GU:  No dysuria.    PHYSICAL EXAMINATION:   VITAL SIGNS:   PULSE:  75.   O2 SATURATIONS:  95% on room air.   RESPIRATIONS:  18.   GENERAL APPEARANCE:  This patient looks remarkably well.  He is in absolutely no distress.    CHEST/RESPIRATORY:  Air entry is remarkably clear bilaterally.    CARDIOVASCULAR:  CARDIAC:   Heart sounds are normal.  No murmurs.  His JVP is not elevated.  No gallops.   GASTROINTESTINAL:  ABDOMEN:   Obvious signs of weight loss.   LIVER/SPLEEN/KIDNEYS:  No liver, no spleen.  No tenderness.   GENITOURINARY:  BLADDER:   No suprapubic tenderness or fullness.  No costovertebral angle tenderness.   CIRCULATION:  EDEMA/VARICOSITIES:  Extremities:  Trace edema.   NEUROLOGICAL:    SENSATION/STRENGTH:  He has good lower  extremity strength.  His knee jerks are intact.   PSYCHIATRIC:   MENTAL STATUS:   Patient appears bright, alert.    LABORATORY DATA/PERTINENT DIAGNOSTIC TESTS:   Echocardiogram on this admission showed a normal ejection fraction with diastolic dysfunction, a normally-functioning bioprosthetic aortic valve.     Hemoglobin on admission was 8.4.  He was transfused.    BNP was as high as 4300.    TSH was normal.    A.m. cortisol was normal.    Blood cultures were normal.    CT of the chest showed no PE.    ASSESSMENT/PLAN:  Admission to hospital with, among other things, pneumonitis, possibly amiodarone-induced.  He is on prednisone 40 mg.  I will see how this gentleman is doing.  I think it is possible that this could be tapered.  I will put him on surveillance CBGs, as well.    Status post CABG x1 and bioprosthetic aortic valve replacement.  From a cardiac point of view, he really looks very stable.  I have asked for an urgent re-weight this morning so I can make sense out of the 17 lbs weight loss he has supposedly had in the facility.  If this is accurate, he may need to have a reduction in diuretic, although I have my doubts at this point.    Moderate size pleural effusions, right greater than left.  There is no evidence of this at the bedside.    Hypokalemia.  This will need to be rechecked.  He is on potassium 40 a day.    Hyponatremia with diuresis.  With fluid restriction, his sodium increased to 136.    Atrial flutter, which was the reason the amiodarone was started in the first place.  He does have first-degree AV block.  There was nothing recorded during his hospitalization in terms of atrial flutter.    Anemia.  His hemoglobin was 8.4.  He was transfused.  He was on iron.    This is a medically complex man with now two very complex hospitalizations.  He looks remarkably stable at the bedside.  Follow up on the weights as noted above.  Follow up on the lab work for my  review in two days.    CPT CODE: 09811

## 2013-07-15 LAB — GLUCOSE, CAPILLARY: Glucose-Capillary: 86 mg/dL (ref 70–99)

## 2013-07-16 LAB — GLUCOSE, CAPILLARY: Glucose-Capillary: 86 mg/dL (ref 70–99)

## 2013-07-18 LAB — GLUCOSE, CAPILLARY: Glucose-Capillary: 89 mg/dL (ref 70–99)

## 2013-07-19 LAB — GLUCOSE, CAPILLARY: Glucose-Capillary: 87 mg/dL (ref 70–99)

## 2013-07-20 ENCOUNTER — Other Ambulatory Visit: Payer: Self-pay | Admitting: *Deleted

## 2013-07-20 DIAGNOSIS — I350 Nonrheumatic aortic (valve) stenosis: Secondary | ICD-10-CM

## 2013-07-21 NOTE — Progress Notes (Addendum)
Patient ID: Joseph Garner, male   DOB: 08-29-1924, 79 y.o.   MRN: [id number]           PROGRESS NOTE  DATE:   07/12/2013  FACILITY:  [facility]    LEVEL OF CARE:   SNF   Acute Visit   CHIEF COMPLAINT:   Follow up cardiac status.    HISTORY OF PRESENT ILLNESS:  I saw [name] last week.   At that point, he had described to me a sense of exhaustion when he is up walking.  I may have put words into his mouth.  However, I described this in my note as dyspnea, but clearly there is something exertional that stopped him from walking freely.  I actually was concerned enough that I increased his beta blocker and Imdur.  He went on to see his cardiologist, who I do not think shared my concern about things.  He discontinued the Imdur and reduced the metoprolol to 12.5 b.i.d.    Today, the patient is describing a sense of exertional dizziness and/or unsteadiness.  I actually took him walking and, after 15 yards, he stops to report that he feels unsteady but does not describe real dyspnea or chest pain, pressure, etc.    REVIEW OF SYSTEMS:   CHEST/RESPIRATORY:  Exertional shortness of breath.  Does not seem to be part of his major complaint.   CARDIAC:   No chest pain is described.  No exertional chest heaviness.   GU:  He described some urinary frequency last week.  However, his urine culture was only 7000 colonies, which was insignificant growth.     PHYSICAL EXAMINATION:   VITAL SIGNS:   WEIGHT:  His weight is stable at 137 lbs.   BLOOD PRESSURE:  His blood pressure yesterday morning was 119/57.   PULSE:  69.   O2 SATURATIONS:  Well into the normal range at 96% on room air.   CHEST/RESPIRATORY:  Clear air entry bilaterally.   CARDIOVASCULAR:  CARDIAC:  I continue to hear a soft S4 over the PMI.  There is a soft, benign-sounding mid systolic murmur but no evidence of CHF.   NEUROLOGICAL:   BALANCE/GAIT:  I took this gentleman on a walk.  He walks 15 yards freely and then stops,  complaining of a dizzy sensation.  He then is able to move on.    ASSESSMENT/PLAN:  Amiodarone-induced pulmonary toxicity.  This looks better radiographically.  He is off his amiodarone and completing his prednisone taper.    Exertional symptoms which today seem a lot less like dyspnea on exertion.  He is complaining of "a dizzy sensation".  I might pass all of this off except for the continued presence of a soft S4.    CHF.  I see no evidence of this at the bedside in spite of the elevated Pro-BNP.  He remains on Lasix 40 mg and I do not think this needs to be adjusted.    I soul like to check his orthostatic blood pressures

## 2013-07-22 ENCOUNTER — Ambulatory Visit (INDEPENDENT_AMBULATORY_CARE_PROVIDER_SITE_OTHER): Payer: Self-pay | Admitting: Cardiothoracic Surgery

## 2013-07-22 ENCOUNTER — Inpatient Hospital Stay
Admit: 2013-07-22 | Discharge: 2013-07-22 | Disposition: A | Discharge status: Home / Self Care | Payer: Medicare Other | Attending: Cardiothoracic Surgery | Admitting: Cardiothoracic Surgery

## 2013-07-22 ENCOUNTER — Encounter: Payer: Self-pay | Admitting: Cardiothoracic Surgery

## 2013-07-22 VITALS — BP 97/63 | HR 70 | Resp 20 | Ht 68.0 in | Wt 144.0 lb

## 2013-07-22 DIAGNOSIS — I2510 Atherosclerotic heart disease of native coronary artery without angina pectoris: Secondary | ICD-10-CM

## 2013-07-22 DIAGNOSIS — Z954 Presence of other heart-valve replacement: Secondary | ICD-10-CM

## 2013-07-22 DIAGNOSIS — R0602 Shortness of breath: Secondary | ICD-10-CM | POA: Diagnosis not present

## 2013-07-22 DIAGNOSIS — I350 Nonrheumatic aortic (valve) stenosis: Secondary | ICD-10-CM

## 2013-07-22 DIAGNOSIS — I359 Nonrheumatic aortic valve disorder, unspecified: Secondary | ICD-10-CM

## 2013-07-22 DIAGNOSIS — R911 Solitary pulmonary nodule: Secondary | ICD-10-CM

## 2013-07-22 DIAGNOSIS — Z952 Presence of prosthetic heart valve: Secondary | ICD-10-CM

## 2013-07-22 DIAGNOSIS — Z951 Presence of aortocoronary bypass graft: Secondary | ICD-10-CM

## 2013-07-22 LAB — GLUCOSE, CAPILLARY: Glucose-Capillary: 87 mg/dL (ref 70–99)

## 2013-07-22 NOTE — Progress Notes (Signed)
[address]       [place] [address]             [phone]                  [facility] Medical Record #[id number] Date of Birth: 03-18-1924  Joseph Schultz, MD Georgann Housekeeper, MD  Chief Complaint:   PostOp Follow Up Visit  06/03/2013  DATE OF DISCHARGE:  OPERATIVE REPORT  PREOPERATIVE DIAGNOSES: Critical aortic stenosis with acute and chronic  systolic heart failure and coronary occlusive disease and right lung  nodule.  POSTOPERATIVE DIAGNOSES: Critical aortic stenosis with acute and  chronic systolic heart failure and coronary occlusive disease and right  lung nodule.  SURGICAL PROCEDURE:  1. Aortic valve replacement with pericardial tissue valve, [employer], model #3300 TFX 25 mm, serial [id number].  2. Coronary artery bypass grafting x1 with the reverse saphenous vein  graft to the intermediate coronary artery with left thigh  endo vein harvesting  3. Wedge resection of right lung lesion.  SURGEON: Sheliah Plane, MD   Path: Diagnosis 1. Lung, biopsy, Right lung - FINDINGS CONSISTENT WITH BENIGN PULMONARY HAMARTOMA / CHONDROMA. - NO EVIDENCE OF MALIGNANCY. 2. Heart valve leaflets, aortic - BENIGN VALVULAR TISSUE WITH EMBEDDED CALCIFICATION AND SCATTERED MILD CHRONIC INFLAMMATION. - NO TUMOR SEEN.   History of Present Illness:     Patient now returns for followup office visit after aortic valve replacement and wedge resection of the right lung. 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 He cleared with diuresis and course of steroids. He now continues to be [facility] but making Significant progress. He is now off tapering dose of prednisone. He is increasing his weight appropriately, still has some lower extremity edema. Overall his respiratory  status is markedly improved.   History  Smoking status  . Former Smoker -- 1.50 packs/day for 20 years  . Types: Cigarettes  . Quit date: 10/29/1967  Smokeless tobacco  . Never Used       No Known Allergies  Current Outpatient Prescriptions  Medication Sig Dispense Refill  . traMADol (ULTRAM) 50 MG tablet Take 1 tablet (50 mg total) by mouth every 6 (six) hours as needed.  120 tablet  5   No current facility-administered medications for this visit.       Physical Exam: BP 97/63  Pulse 70  Resp 20  Ht 5\' 8"  (1.727 m)  Wt 144 lb (65.318 kg)  BMI 21.9 kg/m2  SpO2 97%  General appearance: alert, cooperative and appears stated age Neurologic: intact Heart: regular rate and rhythm, S1, S2 normal, no murmur, click, rub or gallop Lungs: clear to auscultation bilaterally and normal percussion bilaterally Abdomen: soft, non-tender; bowel sounds normal; no masses,  no organomegaly Extremities: edema 1-2+ edema both ankles Wound: Sternum is stable and well healed   Diagnostic Studies & Laboratory data:         Recent Radiology Findings: Dg Chest 2 View  07/22/2013   CLINICAL DATA:  Shortness of breath. Recent aortic valve replacement for aortic stenosis.  EXAM: CHEST  2 VIEW  COMPARISON:  Chest x-ray dated 07/05/2013  FINDINGS: There is slight cardiomegaly, diminished since the prior exam. Prosthetic aortic valve noted. No infiltrates or effusions. Pulmonary vascularity is normal. No pneumothorax.  IMPRESSION: Slight cardiomegaly, diminished  since the prior exam. No acute abnormalities.   Electronically Signed   By: Geanie Cooley   On: 07/22/2013 14:11      Recent Labs: Lab Results  Component Value Date   WBC 11.3* 06/28/2013   HGB 10.5* 06/28/2013   HCT 30.6* 06/28/2013   PLT 241 06/28/2013   GLUCOSE 85 07/02/2013   CHOL 120 06/04/2013   TRIG 51 06/04/2013   HDL 50 06/04/2013   LDLCALC 60 06/04/2013   ALT 43 06/25/2013   AST 55* 06/25/2013   NA 132* 07/02/2013   K 3.8 07/02/2013   CL  97 07/02/2013   CREATININE 0.99 07/02/2013   BUN 26* 07/02/2013   CO2 29 07/02/2013   TSH 2.039 06/20/2013   INR 1.32 06/20/2013   HGBA1C 5.3 06/04/2013      Assessment / Plan:      Patient improving after recent aortic valve replacement and wedge resection of the right lung for hamartoma. The patient had developed after surgery what appeared to be pulmonary toxicity secondary to amiodarone, the inflammatory process in the lungs has now resolved The patient is making good progress in rehabilitation and hopefully will be discharged home so. I plan to see him back in 6 weeks.      Joseph Garner B 07/22/2013 5:30 PM

## 2013-07-26 LAB — GLUCOSE, CAPILLARY: Glucose-Capillary: 89 mg/dL (ref 70–99)

## 2013-07-27 ENCOUNTER — Ambulatory Visit (INDEPENDENT_AMBULATORY_CARE_PROVIDER_SITE_OTHER): Payer: Medicare Other | Admitting: Urology

## 2013-07-27 DIAGNOSIS — N401 Enlarged prostate with lower urinary tract symptoms: Secondary | ICD-10-CM

## 2013-07-27 DIAGNOSIS — R351 Nocturia: Secondary | ICD-10-CM

## 2013-07-27 DIAGNOSIS — R350 Frequency of micturition: Secondary | ICD-10-CM

## 2013-07-27 LAB — GLUCOSE, CAPILLARY: Glucose-Capillary: 91 mg/dL (ref 70–99)

## 2013-07-28 DIAGNOSIS — I25810 Atherosclerosis of coronary artery bypass graft(s) without angina pectoris: Secondary | ICD-10-CM | POA: Diagnosis not present

## 2013-07-28 DIAGNOSIS — I509 Heart failure, unspecified: Secondary | ICD-10-CM

## 2013-07-28 LAB — GLUCOSE, CAPILLARY: Glucose-Capillary: 88 mg/dL (ref 70–99)

## 2013-07-29 ENCOUNTER — Non-Acute Institutional Stay (SKILLED_NURSING_FACILITY): Payer: Medicare Other | Admitting: Internal Medicine

## 2013-07-29 DIAGNOSIS — I509 Heart failure, unspecified: Secondary | ICD-10-CM

## 2013-07-29 DIAGNOSIS — E871 Hypo-osmolality and hyponatremia: Secondary | ICD-10-CM

## 2013-07-29 DIAGNOSIS — I951 Orthostatic hypotension: Secondary | ICD-10-CM

## 2013-07-29 DIAGNOSIS — I5031 Acute diastolic (congestive) heart failure: Secondary | ICD-10-CM | POA: Diagnosis not present

## 2013-07-29 DIAGNOSIS — I25810 Atherosclerosis of coronary artery bypass graft(s) without angina pectoris: Secondary | ICD-10-CM

## 2013-07-29 LAB — GLUCOSE, CAPILLARY: Glucose-Capillary: 98 mg/dL (ref 70–99)

## 2013-07-29 NOTE — Progress Notes (Signed)
Patient ID: Joseph Garner, male   DOB: [birth year], 79 y.o.   MRN: [id number]  This is an acute visit.  Level of care skilled.  Facility [facility].  Chief complaint-acute visit secondary to hypotension-dizziness.  History of present illness.  Patient is a very pleasant elderly male history of coronary artery disease status post  aortic valve replacement and CABG bypass x1.  Also recent right lower lobe toxicity felt secondary to the amiodarone which has been discontinued he did receive thoracentesis-  He has been complaining of some fairly persistent dizziness-also appears to have some orthostatic issues and this has been followed for some time.  Today his blood pressures appear to be in the 70s over 40s according to nursing he is complaining of some dizziness but apparently not greatly increased from his baseline.  I did see him in annually his blood pressure sitting was 88/48 he said the dizziness was a bit better this afternoon.  His vital signs otherwise continued to be stable.  He does have a history CHF his weight appears to be relatively stable as well at 141.6 he offers in the high 130s low 140s.  He does not complain interestingly have any increased shortness of breath or chest pain associated with this.  I do note he does have a history of urinary issues has seen urology and on September 30 Flomax was started and his VESIcare was increased to 10 mg a day.  Family medical social history has been reviewed per progress note on 07/05/2013.  Medications have been reviewed per MAR.  Of note he does continue on Lasix with history of significant CHF he is also on Imdur as well as Lopressor 12.5 mg twice a day.  Review of systems.  In general denies any fever or chills.  Respiratory-denies any increased shortness of breath or cough.  Cardiac-extensive history as noted above but does not complaining of any chest pain.  GI-does not complaining of any nausea or vomiting.  Muscle  skeletal does have some weakness but does not complaining of any joint pain.  Neurologic does complain of dizziness does not complaining of headache or visual changes area  Physical exam.  He is afebrile pulse 83 respirations 18 blood pressure 88/48 sitting.  In general this is a very pleasant somewhat frail elderly male in no distress.  His skin is warm and dry.  Oropharynx clear mucous membranes moist.  Chest is clear to auscultation without any labored breathing.  Heart is regular rate and rhythm with an in frequent irregular beat--with a slight 1-2-6r systolic murmur I would say he has trace lower extremity edema.  Abdomen-soft nontender with positive bowel sounds.  Musculoskeletal I do not know any form obese-moves all extremities at baseline is able to emulate.  Neurologic is grossly intact cranial nerves intact speech is clear and did not note any focal deficits.  Psych he is alert and oriented x3 pleasant conversant and appropriate  Labs.  07/14/2013.  Sodium 129 potassium 3.9 BUN 23 creatinine 1.21.  07/07/2013.  WBC 13.9 hemoglobin 12.5 platelets 231.  Sodium 132 potassium 3.8 BUN 22 creatinine 1.22.  Assessment and plan.  #1-hypotension-I did check his blood pressure later he was actually ambulating down the hall with his walker said he was feeling a bit better I got 120/68 in both arms- Do no recent medication changes including addition of Flomax which may be contributing to hypotension-we'll hold this for now as continue current medications and continue to keep a close eye on  this.  This was discussed with Dr. Leanord Hawking via phone.  2-hyponatremia apparently this is chronic I do note he is on Lasix Will update labs tomorrow for followup this could be contributing certainly to his dizziness as well.  #3-CHF-this appears to be relatively stable--  [id number] note 25 minutes spent assessing patient -reassessing patient-and formulating plan of  care  .

## 2013-07-30 ENCOUNTER — Non-Acute Institutional Stay (SKILLED_NURSING_FACILITY): Payer: Medicare Other | Admitting: Internal Medicine

## 2013-07-30 DIAGNOSIS — E871 Hypo-osmolality and hyponatremia: Secondary | ICD-10-CM

## 2013-07-30 DIAGNOSIS — I959 Hypotension, unspecified: Secondary | ICD-10-CM | POA: Insufficient documentation

## 2013-07-30 DIAGNOSIS — D649 Anemia, unspecified: Secondary | ICD-10-CM | POA: Diagnosis not present

## 2013-07-30 LAB — GLUCOSE, CAPILLARY: Glucose-Capillary: 98 mg/dL (ref 70–99)

## 2013-07-30 NOTE — Progress Notes (Signed)
Patient ID: Joseph Garner, male   DOB: Oct 29, 1923, 79 y.o.   MRN: [id number] This is an acute visit.  Level of care skilled.  Facility [facility].   Chief complaint-acute visit follow up hypotension-dizziness--anemia .  History of present illness.   Patient is a very pleasant elderly male history of coronary artery disease status post aortic valve replacement and CABG bypass x1.  Also recent right lower lobe toxicity felt secondary to the amiodarone which has been discontinued he did receive thoracentesis-  He has been complaining of some fairly persistent dizziness-also appears to have some orthostatic issues and this has been followed for some time  did see him yesterday for occasional systolic pressures in the 70s.--This had improved later in the evening to systolics over 100 and he was actually ambulating with a walker.  We have held his Flomax which he was recently started on for urinary issues.  This morning his systolics were in the 80s his Lopressor was held this morning I am following up on this.  His blood pressure did improve later in the day and actually when I took his orthostatics they were generally 108/60 lying sitting and standing systolic dropped slightly to 102.  He continues to complain of dizziness but was able to stand without difficulty     He does have a history CHF his weight appears to be relatively stable -- in the high 130s low 140s--he is on Lasix 40 mg a day with potassium supplementation.  He also has a history of hyponatremia and actually this appears to be relatively baseline to slowly improved today with a sodium of 1:30.  He does not complain interestingly have any increased shortness of breath or chest pain associated with this  We have also obtained a CBC which shows a hemoglobin of 9.2 this is normocytic-per chart review it appears on September 1 his hemoglobin was 10.5.  During his hospitalization appeared to her reach a low of 8.4 largely ran in the  eights and l nines during his hospitalization--but I see lab done back on September 10th showed a hemoglobin of 12.5--- he is on iron twice a day  .   Marland Kitchen  Family medical social history has been reviewed per progress note on 07/05/2013.   Medications have been reviewed per MAR.   Of note he does continue on Lasix with history of significant CHF he is also on Imdur as well as Lopressor 12.5 mg twice a day.-andf iron twice a day   Review of systems.  In general denies any fever or chills. Skin no complaints of increased bruising bleeding  Respiratory-denies any increased shortness of breath or cough.  Cardiac-extensive history as noted above but does not complaining of any chest pain. .  Muscle skeletal does have some weakness but does not complaining of any joint pain. GI-no complaints of nausea or vomiting diarrhea or constipation  Neurologic does complain of dizziness does not complaining of headache or visual changes area  Psych does not exhibit any depressive symptoms continues to be upbeat  Physical exam.  He is afebrile pulse 80 respirations 18 blood pressure as noted in history of present illness-of note all his systolics orthostatically this p.m. Were  over 100   In general this is a very pleasant somewhat frail elderly male in no distress.  His skin is warm and dry.  Oropharynx clear mucous membranes moist.  Chest is clear to auscultation without any labored breathing.  Heart is regular rate and rhythm with occasional  irregular beast--with a slight 1-2- systolic murmur I would say he has trace lower extremity edema.  Abdomen-soft nontender with positive bowel sounds. Rectal- I did perform a digital guaic exam which appeared to be negative  Musculoskeletal I do not know any form obese-moves all extremities at baseline is able to ambulate.  Neurologic is grossly intact cranial nerves intact speech is clear and did not note any focal deficits--he was able to stand without  difficulty.  Psych he is alert and oriented x3 pleasant conversant and appropriate   Labs 07/30/2013.  WBC 7.7 hemoglobin 9.2 platelets 209.  Sodium 1:30 potassium 3.9 BUN 19 creatinine 1.2.  07/14/2013.  Sodium 129 potassium 3.9 BUN 23 creatinine 1.21.  07/07/2013.  WBC 13.9 hemoglobin 12.5 platelets 231.  Sodium 132 potassium 3.8 BUN 22 creatinine 1.22.   Assessment and plan.  #1-hypotension-this appears to be somewhat improved this afternoon-Will continue his beta blocker low dose routinely it would be problematic to discontinue this.  We'll reduce his Lasix to 20 mg a day and potassium to 20 meq a day as well.  Will update metabolic panel on Monday  Continue to hold  Flomax .  2-hyponatremia --this remains relatively mild and at baseline  l.  #3-CHF-this appears to be relatively stable-have reduced his Lasix secondary to hypotension concerns continue to monitor this closely.  #4-anemia-he does continue on iron- Guaic stool test was negative tonight.  Will update CBC on Monday, October 6 along with reticulocyte count ferritin total iron binding capacity and serum iron as well as B12 and Folate.. Will also order guaiac stools x3    [id number]-of note 30 minutes spent assessing patient--reviewing his medical history and labs- -and formulating and coordinating plan of care .

## 2013-07-31 LAB — GLUCOSE, CAPILLARY: Glucose-Capillary: 86 mg/dL (ref 70–99)

## 2013-08-01 ENCOUNTER — Inpatient Hospital Stay (HOSPITAL_COMMUNITY)
Admit: 2013-08-01 | Discharge: 2013-08-01 | Disposition: A | Discharge status: Home / Self Care | Payer: Medicare Other | Attending: Internal Medicine | Admitting: Internal Medicine

## 2013-08-01 DIAGNOSIS — R0602 Shortness of breath: Secondary | ICD-10-CM | POA: Diagnosis not present

## 2013-08-01 DIAGNOSIS — R5381 Other malaise: Secondary | ICD-10-CM | POA: Diagnosis not present

## 2013-08-01 LAB — GLUCOSE, CAPILLARY: Glucose-Capillary: 100 mg/dL — ABNORMAL HIGH (ref 70–99)

## 2013-08-02 ENCOUNTER — Telehealth: Payer: Self-pay | Admitting: Cardiology

## 2013-08-02 ENCOUNTER — Non-Acute Institutional Stay (SKILLED_NURSING_FACILITY): Payer: Medicare Other | Admitting: Internal Medicine

## 2013-08-02 DIAGNOSIS — I959 Hypotension, unspecified: Secondary | ICD-10-CM | POA: Diagnosis not present

## 2013-08-02 DIAGNOSIS — I5031 Acute diastolic (congestive) heart failure: Secondary | ICD-10-CM

## 2013-08-02 DIAGNOSIS — I25810 Atherosclerosis of coronary artery bypass graft(s) without angina pectoris: Secondary | ICD-10-CM | POA: Diagnosis not present

## 2013-08-02 DIAGNOSIS — I509 Heart failure, unspecified: Secondary | ICD-10-CM | POA: Diagnosis not present

## 2013-08-02 NOTE — Telephone Encounter (Signed)
Will forward to Dr. Anne Fu for review and and recommendations/

## 2013-08-02 NOTE — Telephone Encounter (Signed)
Contacted Nursing Home @ [phone]. Spoke with Nurse and advised that Dr. Anne Fu agree with decreasing  Lasix to 20 mg daily. Also advised to hold Metoprolol, Patient is scheduled to be seen 08/11/2013.

## 2013-08-02 NOTE — Progress Notes (Signed)
Patient ID: Joseph Garner, male   DOB: 12/18/1923, 79 y.o.   MRN: [id number] [facility] Chief complaint; review of cardiac status, History; this is a patient to underwent an aortic valve replacement with a bioprosthetic valve and CABG x1. Ended up being readmitted to hospital with diastolic heart failure/amiodarone induced pneumonitis. His amiodarone was discontinued. He has been here rehabilitating. He has really had a number of different types of complaints including exertional shortness of breath, and a sensation of dizziness by which he means unsteadiness. The latter of these complaints has really been his most consistent complaint although this is not exertional. It is not postural. He has not complained of anything exertional in terms of chest pain, pressure, heaviness etc. we have also had some problems with low blood pressures which have necessitated staff holding his Lopressor. This his systolic blood pressures in the 80s   Because of this list of complaints he has been over for a chest x-ray [see below].  Lab work from this mornin platelet count 163 his sodium is 130 potassium 3.5 calcium 8.1 reticulocyte percent of 2.1%g shows a white count of 8.1 hemoglobin of 9.6       COMPARISON:  07/22/2013 and 07/05/2013   FINDINGS: Lungs are somewhat hyperexpanded without focal consolidation. There is a suggestion of mild pleural fluid posteriorly on the lateral film. The mild stable cardiomegaly. Remainder of the exam is unchanged.   IMPRESSION: Possible small amount of pleural fluid seen posteriorly on the lateral film. No focal consolidation.     Electronically Signed   By: Elberta Fortis M.D.   On: 08/01/2013 15:11  Review of systems systems; Respiratory he is not complaining of shortness of breath Cardiac no chest pain or palpitations Abdomen no abdominal pain Extremities no complaints of pain  Physical; O2 sat is 95% pulse rate 77 respirations 18 he is comfortable lying  down. BP is 110/60 supine dropping to 95 systolic standing without clear symptoms. There is no pulsus paradox Weight over the last several measurements 141.6 141.3 148.4 most recently 144.2 Respiratory; clear entry bilaterally Cardiac heart sounds are normal there is no S4 [soft S4 I appreciated earlier in his stay here is not been present over the last 2 weeks] JVP is not elevated Abdomen; no palpable tenderness no masses Extremities mild edema of the right leg to just above the ankle greater than left but no signs of a DVT. Gait; walked him up and down the hall with his walker. There does not appear to be any any exertional limitation  Impression/plan  #1Coronary artery disease; if any of this is active it is very difficult to have any degree of certainty about this. He appears to be without exertional limitation which even for an anginal variant would be unusual. His EKG does show lateral ST depression in V6 however this is not new. He has downsloping ST depression in the high lateral leads. I'll have him re\re looked at them and cardiology just to be certain I am not overlooking anything however I really don't see an issue here.  #2 diastolic CHF with a normal EF. I see no major issues here. We'll continue to monitor. Repeat natruretic peptide #3 mild hyponatremia with a sodium of 131 his Lasix was reduced from 40-20due to this.

## 2013-08-02 NOTE — Telephone Encounter (Signed)
New problem:  Pt's daughter states her father has been dizzy and his BP yesterday was 88/50 and then it went to 155/73... Pt's daughter states her father's lasix decreased on Friday per the Doctor at rehab. Pt's daughter states her father had a x-ray and it showed fluid. Pt's daughter would like to be advised.

## 2013-08-02 NOTE — Progress Notes (Signed)
Patient ID: Joseph Garner, male   DOB: 23-Jul-1924, 79 y.o.   MRN: [id number]           PROGRESS NOTE  DATE:  07/28/2013   FACILITY: [facility]    LEVEL OF CARE:   SNF   Acute Visit   CHIEF COMPLAINT:  Follow up cardiac status.    HISTORY OF PRESENT ILLNESS:   Mr. Brumley is a gentleman who came to [place] after undergoing an aortic valve replacement and a single-vessel bypass.  I had some concerns earlier in September that he was experiencing an anginal variant, including shortness of breath on exertion.  At that point, I thought he had a soft S4.  He saw his cardiologist, who did not really share any concerns with him.    He continues to talk about a sensation of unsteadiness when he walks, although it is very difficult to put this into words exactly.  He also has some exertional shortness of breath.  He basically describes a sensation of dizziness/unsteadiness which can be present all the time, even when he is sitting or lying.  This does not sound orthostatic, although I think it is reasonable to check that.    LABORATORY DATA:   Recent lab work on 07/14/2013 showed a sodium of 129, potassium of 3.9, a BUN of 23 and a creatinine of 1.21.    CURRENT MEDICATIONS:  Medication list is reviewed.     Amiodarone 200 b.i.d.    Enteric-coated aspirin 325 q.d.     Atorvastatin 20 q.d.    Doxazosin 4 mg p.o. q.h.s.    Ferrous fumarate 325 p.o. b.i.d.    Lasix 40 q.d.    KCl 20 q.d.    VESIcare 5 mg daily.    REVIEW OF SYSTEMS:     CHEST/RESPIRATORY:  Exertional shortness of breath, but no cough, no sputum.   CARDIAC:   He does not describe exertional chest pain or palpitations, but does describe shortness of breath on exertion.    GU:  He has urinary frequency which he thinks is worse since his surgery, although he went back to see Urology (Dr. Retta Diones).  I do not see these notes.   NEUROLOGICAL:   No headache.  No diplopia.  No swallowing difficulties.  He does not have any  lateralizing neurologic complaints.    PHYSICAL EXAMINATION:   VITAL SIGNS:   PULSE:  71.   O2 SATURATIONS:  95% on room air.   RESPIRATIONS:   18.   GENERAL APPEARANCE:  He is in absolutely no distress.   CHEST/RESPIRATORY:  Clear air entry bilaterally.   CARDIOVASCULAR:  CARDIAC:   Heart sounds are normal.  I did not appreciate the S4.  No murmurs.  No carotid bruits.   GASTROINTESTINAL:  ABDOMEN:   Somewhat distended.   LIVER/SPLEEN/KIDNEYS:  No liver, no spleen.  No tenderness.   GENITOURINARY:  BLADDER:   His bladder is not overtly distended.   NEUROLOGICAL:    CRANIAL NERVES:  His cranial nerves seem intact.  There is no visual field loss.  Perhaps mild right pronator drift.  This is a fairly soft finding.  I can see no other issues here.   BALANCE/GAIT:  Gait is really steady with his walker.  There is no evidence of any unsteadiness that he describes.    ASSESSMENT/PLAN:  Coronary artery disease.  Status post aortic valve replacement.  He really looks quite good here.  There is no evidence of congestive heart  failure at the bedside.    Exertional shortness of breath.  Once again, he is completely without any bedside findings.    Unsteadiness when walking.  Other than the really mild pronator drift on the right, I have no issues here that I can really get concerned about.    Urinary frequency.  He had a clean urinalysis on 07/20/2013.  We will check a postvoid residual on him via bladder ultrasound.    Hyponatremia from two weeks ago.  I will actually repeat this.  A sodium of 129 may be enough to explain his unsteadiness and would suggest that we are giving him too much diuretic, although that is not really evident at the bedside.    CPT CODE: 16109

## 2013-08-02 NOTE — Telephone Encounter (Signed)
I agree with decreasing Lasix in the setting of hypotension. I reviewed the x-ray results and he has very minimal pleural effusion which should not be significantly concerning. Continue with metoprolol low-dose 12.5 mg twice a day for now. Perhaps try Lasix 20 mg once a day.

## 2013-08-04 LAB — GLUCOSE, CAPILLARY

## 2013-08-06 ENCOUNTER — Non-Acute Institutional Stay (SKILLED_NURSING_FACILITY): Payer: Medicare Other | Admitting: Internal Medicine

## 2013-08-06 DIAGNOSIS — D649 Anemia, unspecified: Secondary | ICD-10-CM

## 2013-08-06 DIAGNOSIS — I959 Hypotension, unspecified: Secondary | ICD-10-CM | POA: Diagnosis not present

## 2013-08-06 DIAGNOSIS — I5031 Acute diastolic (congestive) heart failure: Secondary | ICD-10-CM

## 2013-08-06 DIAGNOSIS — E871 Hypo-osmolality and hyponatremia: Secondary | ICD-10-CM

## 2013-08-06 DIAGNOSIS — I509 Heart failure, unspecified: Secondary | ICD-10-CM

## 2013-08-06 LAB — GLUCOSE, CAPILLARY: Glucose-Capillary: 94 mg/dL (ref 70–99)

## 2013-08-07 LAB — GLUCOSE, CAPILLARY: Glucose-Capillary: 91 mg/dL (ref 70–99)

## 2013-08-07 NOTE — Progress Notes (Signed)
Patient ID: Joseph Garner, male   DOB: Mar 09, 1924, 79 y.o.   MRN: [id number]  This is an acute visit.  Level care skilled.  Facility [facility].  The date is 08/07/2013  Chief complaint-acute visit secondary to weight gain with history of CHF-followup hyponatremia-anemia.  History of present illness.  Patient is a very pleasant elderly male with a complex medical history including recent aortic valve replacement-complicated with diastolic CHF-also has mild hyponatremia as well as anemia.  Nursing staff has noted a weight gain of about 8 pounds the past 4 days.  He does not complaining of any increased shortness of breath or chest pain--chest x-ray on October 5 showed a possible  Some  small pleural fluid.  He does have a history of diastolic CHF Lasix was recently reduced from 40 mg-to 20 mg a day secondary to hyponatremia concerns.  Most recent sodium is 129 which is relatively baseline previous sodium was 131.  Another issue with some hypotension recently addressed withd/c Flomax which was a new medication he was put on secondary to his urologic history with urinary retention.  Blood pressures appear to be have improved I got 130/60 manually today I see previous 135/58-there is some variation between the 150s and 90s systolically but generally this appears to be improved  He also has a history of anemia-I think there is some element of chronic disease here recent iron studies showed an iron of 22 total iron binding capacity 207 B12 and folate were within normal limits ferritin was elevated at over 9.  Most recent hemoglobin is 9.6 which per chart review appears relatively close to his baseline.  Family medical social history as been reviewed per progress note on 07/05/2013.  Medications have been reviewed per MAR.  Review of systems.  In general denies any fever or chills.  Eyes denies any visual changes.  Respiratory-no complaints of shortness of breath or  cough.  Cardiac-extensive history here but does not complaining of chest pain his edema appears to be relatively baseline possibly slightly increased.  GI-no complaints of nausea or vomiting diarrhea or constipation.  GU-has had some history of urinary retention he is on VESIcare.  Muscle skeletal-is not complaining of joint pain.  Neurologic- still complaining of unsteadiness or dizziness although it appears. This is getting somewhat better.  Physical exam.  Temperature is 98.2 pulse 75 respirations 20 blood pressure manually 130/60 his weight 150.2.  In general this is a pleasant somewhat frail elderly male in no distress sitting on the side of his bed.  The skin is warm and dry there is a well-healed midline surgical scar on the chest.  Chest is clear to auscultation without rhonchi rales or wheezes no labored breathing.  Heart is regular rate and rhythm without murmur gallop or rub.  Abdomen soft nontender with positive bowel sounds.  Muscle skeletal moves all extremities at baseline I do not note any deformity strength appears to be intact at baseline all 4 extremities.  Neurologic grossly intact no lateralizing findings cranial nerves intact.  Psychshe is alert and oriented x3 pleasant and appropriate.  Labs.  08/02/2013.  WBC 8.4 hemoglobin 9.6 platelets 163.  08/04/2013.  Sodium 129 potassium 3.6 BUN 30 creatinine 1.01.  [id number].  Assessment and plan.  #1-weight gain with history of diastolic CHF-we'll increase his Lasix back to 40 mg a day increase potassium accordingly-we'll check a basic metabolic panel on Monday, October 13-this was discussed with Dr. Park Breed is challenging in light of his history of hyponatremia --  we will certainly have to keep an eye on his sodium level as well  #2-history of hyponatremia-please see above.  #3-history of hypotension-this appears to be improved somewhat his Flomax as been discontinued.  #4-anemia-I suspect  this is  An element of  chronic disease Will continue to monitor he is on iron  [id number]-of note  greater than 25 minutes spent assessing patient and coordinating and formulating a plan of care for numerous diagnoses

## 2013-08-08 ENCOUNTER — Encounter: Payer: Self-pay | Admitting: Cardiology

## 2013-08-08 LAB — GLUCOSE, CAPILLARY: Glucose-Capillary: 98 mg/dL (ref 70–99)

## 2013-08-09 ENCOUNTER — Ambulatory Visit (HOSPITAL_COMMUNITY)
Admit: 2013-08-09 | Discharge: 2013-08-09 | Disposition: A | Discharge status: Skilled Nursing Facility | Payer: Medicare Other | Source: Skilled Nursing Facility | Attending: Internal Medicine | Admitting: Internal Medicine

## 2013-08-09 ENCOUNTER — Non-Acute Institutional Stay (SKILLED_NURSING_FACILITY): Payer: Medicare Other | Admitting: Internal Medicine

## 2013-08-09 ENCOUNTER — Ambulatory Visit (HOSPITAL_COMMUNITY): Payer: Medicare Other | Attending: Internal Medicine | Admitting: Physical Therapy

## 2013-08-09 DIAGNOSIS — I509 Heart failure, unspecified: Secondary | ICD-10-CM | POA: Diagnosis not present

## 2013-08-09 DIAGNOSIS — I5031 Acute diastolic (congestive) heart failure: Secondary | ICD-10-CM

## 2013-08-09 DIAGNOSIS — R0602 Shortness of breath: Secondary | ICD-10-CM | POA: Insufficient documentation

## 2013-08-09 DIAGNOSIS — J449 Chronic obstructive pulmonary disease, unspecified: Secondary | ICD-10-CM | POA: Diagnosis not present

## 2013-08-09 NOTE — Progress Notes (Signed)
Patient ID: Joseph Garner, male   DOB: Apr 15, 1924, 79 y.o.   MRN: [id number]  Facility; [facility] Chief complaint; predischarge review History: Mr. Lineman is a gentleman who came to [place] after having an aortic bowel for placement with a bios prosthetic valve and a CABG x1. He was seen by our service last week noted to have peripheral edema. His BNP on October 8 was 3044. He continues to have a complaints of postural dizziness and unsteadiness when he is walking. He does have mild postural hypotension of 15 mm of mercury which I checked last week. Also of concern this week as his weight is up to 150.6 pounds up from a low of 139.9 on September 30 the. He does have a history of diastolically mediated congestive heart failure. His Lasix was increased from 20-40 on October 10. Also because his blood pressure was apparently reasonably low and the complaints about unsteadiness his beta blocker [Lopressor at 12.5 twice a day] was stopped over the phone by his cardiologist. Even though I generally don't agree with abruptly stopping beta blockers in people with coronary artery disease I was called about this and agreed with the order over the phone. Don't believe there has been a major change in his blood pressure today at 113/53.  Lab work from today shows a sodium of 126 potassium of 3.7 a BUN of 14 a creatinine of 1.01 . His sodium has been consistently on the low side for his entire stay here usually in the 129-131 range  Review of systems Respiratory; no exertional shortness of breath however he is waking up coughing at night Cardiac no clear chest pain or palpitations Neurologic still a sensation of unsteadiness when he walks  Physical examination; Gen. he is not in any distress. Vitals as noted his weight is 150.6 pounds which represents a more than 10 pound weight gain over the last 10 days. Blood pressure 113/53 temperature 97.4 pulse 75 respirations are 28 Respiratory bibasilar  crackles are noted no wheezing work of breathing is not excessive Cardiac; PMI displaced towards the anterior axillary line. Extension rated S2 no S3 short systolic murmur JVP is visible at 90 Extremities bilateral edema to mid calf no evidence of a DVT  Impressions/plan #1 congestive heart failure which I'm assuming his diastolic and gradually progressive. I've increased his Lasix by giving him another 60 mg on top of the 40 mg he has had today. I have increased him to 60 mg daily. #2 hyponatremia I think this is probably dilutional #3 vague complaints of unsteadiness when he walks up. Although he does have some degree of orthostatic hypotension I don't think this is the only issue here. #4 coronary artery disease; I had some concerns earlier in his stay here but a soft S4, although I am no longer able to appreciate that now. There is no reason to believe that this is active currently  The patient is certainly not ready for discharge at the moment. He has a followup with his cardiologist on Wednesday which is appropriate, we'll see him Wednesday morning. His weight is coming down he feels better he may be old be discharged than

## 2013-08-10 ENCOUNTER — Encounter: Payer: Self-pay | Admitting: Internal Medicine

## 2013-08-11 ENCOUNTER — Ambulatory Visit (INDEPENDENT_AMBULATORY_CARE_PROVIDER_SITE_OTHER): Payer: Medicare Other | Admitting: Cardiology

## 2013-08-11 ENCOUNTER — Encounter: Payer: Self-pay | Admitting: Cardiology

## 2013-08-11 VITALS — BP 126/62 | HR 72 | Ht 68.0 in | Wt 150.0 lb

## 2013-08-11 DIAGNOSIS — Z952 Presence of prosthetic heart valve: Secondary | ICD-10-CM

## 2013-08-11 DIAGNOSIS — I5032 Chronic diastolic (congestive) heart failure: Secondary | ICD-10-CM | POA: Diagnosis not present

## 2013-08-11 DIAGNOSIS — I25810 Atherosclerosis of coronary artery bypass graft(s) without angina pectoris: Secondary | ICD-10-CM | POA: Diagnosis not present

## 2013-08-11 DIAGNOSIS — I951 Orthostatic hypotension: Secondary | ICD-10-CM | POA: Diagnosis not present

## 2013-08-11 DIAGNOSIS — Z954 Presence of other heart-valve replacement: Secondary | ICD-10-CM

## 2013-08-11 HISTORY — DX: Chronic diastolic (congestive) heart failure: I50.32

## 2013-08-11 LAB — GLUCOSE, CAPILLARY: Glucose-Capillary: 89 mg/dL (ref 70–99)

## 2013-08-11 NOTE — Patient Instructions (Signed)
Your physician recommends that you continue on your current medications as directed. Please refer to the Current Medication list given to you today.  Your physician recommends that you schedule a follow-up appointment in: 10/13/13 @ 2pm

## 2013-08-11 NOTE — Progress Notes (Signed)
[address] [place], [place]  [address] Phone: [phone] Fax:  [phone]  Date:  08/11/2013   ID:  Joseph Garner, DOB 1924-05-23, MRN [id number]  PCP:  Georgann Housekeeper, MD   History of Present Illness: Joseph Garner is a 79 y.o. male with bioprosthetic aortic valve replacement, with postoperative readmission to hospital secondary to acute diastolic heart failure, pneumonitis with cessation of amiodarone here for followup. He has been seen at [facility] rehabilitation.  Having dry cough. Compression hose.  His wife is concerned about him coming back home, she is nervous about this. We spoke at length about restrictions such as driving.    Wt Readings from Last 3 Encounters:  08/11/13 150 lb (68.04 kg)  07/22/13 144 lb (65.318 kg)  07/02/13 144 lb 2.9 oz (65.4 kg)     Past Medical History  Diagnosis Date  . BPH (benign prostatic hypertrophy)   . Diverticulosis 2006  . Depression   . Anemia   . Spinal stenosis   . Lung nodules 2010  . Actinic keratosis, hx of   . Macular degeneration   . Hypertension   . Allergy     allergic rhinitis  . Hearing loss     wears bilateral hearing aids  . Inguinal hernia     left side -surgery planned  . GERD (gastroesophageal reflux disease)   . Arthritis     generalized-more back issues  . Severe aortic stenosis 04/16/2013  . Nodule of right lung 05/24/2013  . CHF (congestive heart failure)   . Shortness of breath   . Atrial flutter     Past Surgical History  Procedure Laterality Date  . Tonsillectomy  1933  . Appendectomy  1947  . Neck surgery  1997    compressed nerve  . Skin biopsy  2003-2011    multiple   . Cataract extraction, bilateral      bilateral  . Inguinal hernia repair Left 02/11/2013    Procedure: LEFT LAPAROSCOPIC REPAIR INGUINAL HERNIA WITH MESH;  Surgeon: Ernestene Mention, MD;  Location: [facility] ORS;  Service: General;  Laterality: Left;  . Insertion of mesh Left 02/11/2013    Procedure:  INSERTION OF MESH;  Surgeon: Ernestene Mention, MD;  Location: [facility] ORS;  Service: General;  Laterality: Left;  . Hernia repair  02/24/13    lap LIH repair  . Aortic valve replacement N/A 06/04/2013    Procedure: AORTIC VALVE REPLACEMENT (AVR);  Surgeon: Delight Ovens, MD;  Location: [facility] OR;  Service: Open Heart Surgery;  Laterality: N/A;  . Intraoperative transesophageal echocardiogram N/A 06/04/2013    Procedure: INTRAOPERATIVE TRANSESOPHAGEAL ECHOCARDIOGRAM;  Surgeon: Delight Ovens, MD;  Location: [facility] OR;  Service: Open Heart Surgery;  Laterality: N/A;  . Coronary artery bypass graft N/A 06/04/2013    Procedure: CORONARY ARTERY BYPASS GRAFTING (CABG);  Surgeon: Delight Ovens, MD;  Location: [facility] OR;  Service: Open Heart Surgery;  Laterality: N/A;  . Wedge resection Right 06/04/2013    Procedure: WEDGE RESECTION Right Lower Lobe;  Surgeon: Delight Ovens, MD;  Location: [facility] OR;  Service: Open Heart Surgery;  Laterality: Right;    Current Outpatient Prescriptions  Medication Sig Dispense Refill  . traMADol (ULTRAM) 50 MG tablet Take 1 tablet (50 mg total) by mouth every 6 (six) hours as needed.  120 tablet  5   No current facility-administered medications for this visit.   Prior to Admission medications   Medication  Sig Start Date End Date Taking? Authorizing Provider  aspirin EC 325 MG EC tablet Take 1 tablet (325 mg total) by mouth daily. 06/15/13  Yes Erin Barrett, PA-C  atorvastatin (LIPITOR) 20 MG tablet Take 1 tablet (20 mg total) by mouth daily at 6 PM. 06/15/13  Yes Erin Barrett, PA-C  ferrous fumarate (HEMOCYTE - 106 MG FE) 325 (106 FE) MG TABS tablet Take 1 tablet (106 mg of iron total) by mouth 2 (two) times daily. 06/15/13  Yes Erin Barrett, PA-C  fluticasone (FLONASE) 50 MCG/ACT nasal spray Place 2 sprays into the nose daily as needed for allergies.    Yes Historical Provider, MD  folic acid (FOLVITE) 1 MG tablet Take 1 tablet (1 mg total) by mouth daily. 06/15/13  Yes Erin Barrett,  PA-C  furosemide (LASIX) 40 MG tablet Take 1 tablet (40 mg total) by mouth daily. 06/15/13  Yes Erin Barrett, PA-C  loratadine (CLARITIN) 10 MG tablet Take 10 mg by mouth daily as needed for allergies.    Yes Historical Provider, MD  magnesium hydroxide (MILK OF MAGNESIA) 400 MG/5ML suspension Take 15 mLs by mouth daily as needed. 07/02/13  Yes Donato Schultz, MD  Multiple Vitamin (MULTIVITAMIN WITH MINERALS) TABS Take 1 tablet by mouth daily.   Yes Historical Provider, MD  potassium chloride SA (K-DUR,KLOR-CON) 20 MEQ tablet Take 2 tablets (40 mEq total) by mouth daily. 07/02/13  Yes Donato Schultz, MD  solifenacin (VESICARE) 5 MG tablet Take 5 mg by mouth daily.   Yes Historical Provider, MD  traMADol (ULTRAM) 50 MG tablet Take 1 tablet (50 mg total) by mouth every 6 (six) hours as needed. 07/05/13  Yes Claudie Revering, NP     Allergies:   No Known Allergies  Social History:  The patient  reports that he quit smoking about 45 years ago. His smoking use included Cigarettes. He has a 30 pack-year smoking history. He has never used smokeless tobacco. He reports that he drinks alcohol. He reports that he does not use illicit drugs.   ROS:  Please see the history of present illness.   Denies any chest pain, syncope, fevers, orthopnea, PND.   All other systems reviewed and negative.   PHYSICAL EXAM: VS:  BP 126/62  Pulse 72  Ht 5\' 8"  (1.727 m)  Wt 150 lb (68.04 kg)  BMI 22.81 kg/m2  SpO2 97% Well nourished, well developed, in no acute distressElderly HEENT: normal Neck: no JVD Cardiac:  normal S1, S2; RRR; soft systolic murmur Lungs:  clear to auscultation bilaterally, no wheezing, rhonchi or rales Abd: soft, nontender, no hepatomegaly Ext: no edemaCompression hose in place Skin: warm and dry Neuro: no focal abnormalities noted      ASSESSMENT AND PLAN:  1. Aortic stenosis/bioprosthetic aortic valve replacement-long rehabilitation efforts. About to go home. Continue with current medications.  Overall strength has improved. Pleased with his progress. 2. Diastolic heart failure-currently well compensated. Continue with Lasix for now. 3. I encouraged them to discuss with physical limitations he may have with Dr. Gordy Levan at rehabilitation Center. I would like for him to refrain from driving at this time. He will be seeing his primary physician, Dr. Donette Larry, soon.  Signed, Donato Schultz, MD [facility]  08/11/2013 12:52 PM

## 2013-08-12 LAB — GLUCOSE, CAPILLARY: Glucose-Capillary: 96 mg/dL (ref 70–99)

## 2013-08-14 LAB — GLUCOSE, CAPILLARY: Glucose-Capillary: 94 mg/dL (ref 70–99)

## 2013-08-16 ENCOUNTER — Non-Acute Institutional Stay (SKILLED_NURSING_FACILITY): Payer: Medicare Other | Admitting: Internal Medicine

## 2013-08-16 DIAGNOSIS — I5032 Chronic diastolic (congestive) heart failure: Secondary | ICD-10-CM

## 2013-08-16 DIAGNOSIS — E871 Hypo-osmolality and hyponatremia: Secondary | ICD-10-CM | POA: Diagnosis not present

## 2013-08-16 LAB — GLUCOSE, CAPILLARY: Glucose-Capillary: 91 mg/dL (ref 70–99)

## 2013-08-16 NOTE — Progress Notes (Signed)
Patient ID: Joseph Garner, male   DOB: [birth year], 79 y.o.   MRN: [id number]   Facility; [facility] Chief complaint; predischarge review History: Mr. [name] is a gentleman who came to [place] after having an aortic bowel for placement with a bios prosthetic valve and a CABG x1. He was seen by our service last week noted to have peripheral edema. His BNP on October 8 was 3044. He continues to have a complaints of postural dizziness and unsteadiness when he is walking. He does have mild postural hypotension of 15 mm of mercury which I checked last week. He continues to have a complaint of episodic unsteadiness which is not clearly postural although it occurs when he is up to. His cardiologist stopped his beta blocker due to reasonably low blood pressures which currently are in the 110-120 systolic range. His blood pressure this morning was 120/86 pulse 77  The other issue which is really kept him in the facility beyond what I wanted for last week was low sodiums which got down to a low of 124 on August 15 of. With the increase in Lasix his edema is now controlled on his sodium is up to 131.  Review of systems Respiratory; no exertional shortness of breath however he is waking up coughing at night Cardiac no clear chest pain or palpitations Neurologic still a sensation of unsteadiness when he walks  Physical examination; Gen. he is not in any distress. Blood pressure is 120/86 sitting in the right arm. P-77 Vitals as noted his weight is 144.6pounds which represents a loss of 6 pounds since last week. Respiratory lungs are clear Cardiac; PMI displaced towards the anterior axillary line. Extension rated S2 no S3 short systolic murmur J no increase in jugular venous pressure Extremities right lateral lower extremity edema is now trivial much better than last week.  Impressions/plan #1 congestive heart failure which I'm assuming his diastolic and gradually progressive. I've increased his  Lasix by giving him another 60 mg on top of the 40 mg he has had today. I have increased him to 60 mg daily. He will be discharged on 60 mg of Lasix. #2 hyponatremia I think this is probably dilutional and indeed has come up with increase in his Lasix to 60 #3 vague complaints of unsteadiness when he walks up. Although he does have some degree of orthostatic hypotension I don't think this is the only issue here. #4 coronary artery disease; I had some concerns earlier in his stay here but a soft S4, although I am no longer able to appreciate that now. There is no reason to believe that this is active currently  I think the patient is stable for discharge as mentioned his sodium is 131 BUN 18 creatinine 1.18 on Lasix 60 mg all of these values are stable as of today. His weight has gone from 150 last Wednesday to 144.6 today he is much less edema. He still has complaints of mild unsteadiness when he walks or. I don't think this is clearly orthostatic although he does have an orthostatic blood pressure drop of 15. His beta blocker [metoprolol at 12.5 twice a day] was stopped 2 weeks ago by his cardiologist due to these complaints largely.  Discharge medications; enteric-coated aspirin 325 daily, Lipitor 20 daily, ferrous fumarate 325 one tablet twice a day, Lasix 60 mEq daily, potassium 40 mEq daily, VESIcare 5 mg daily Claritin 10 mg daily,

## 2013-08-17 ENCOUNTER — Ambulatory Visit: Payer: Medicare Other | Admitting: Cardiology

## 2013-08-24 ENCOUNTER — Ambulatory Visit (INDEPENDENT_AMBULATORY_CARE_PROVIDER_SITE_OTHER): Payer: Medicare Other | Admitting: Urology

## 2013-08-24 ENCOUNTER — Encounter (INDEPENDENT_AMBULATORY_CARE_PROVIDER_SITE_OTHER): Payer: Self-pay

## 2013-08-24 DIAGNOSIS — N401 Enlarged prostate with lower urinary tract symptoms: Secondary | ICD-10-CM

## 2013-08-30 DIAGNOSIS — K219 Gastro-esophageal reflux disease without esophagitis: Secondary | ICD-10-CM | POA: Diagnosis not present

## 2013-08-30 DIAGNOSIS — Z0000 Encounter for general adult medical examination without abnormal findings: Secondary | ICD-10-CM | POA: Diagnosis not present

## 2013-08-30 DIAGNOSIS — N400 Enlarged prostate without lower urinary tract symptoms: Secondary | ICD-10-CM | POA: Diagnosis not present

## 2013-08-30 DIAGNOSIS — I259 Chronic ischemic heart disease, unspecified: Secondary | ICD-10-CM | POA: Diagnosis not present

## 2013-08-30 DIAGNOSIS — Z954 Presence of other heart-valve replacement: Secondary | ICD-10-CM | POA: Diagnosis not present

## 2013-08-30 DIAGNOSIS — I10 Essential (primary) hypertension: Secondary | ICD-10-CM | POA: Diagnosis not present

## 2013-08-30 DIAGNOSIS — Z1331 Encounter for screening for depression: Secondary | ICD-10-CM | POA: Diagnosis not present

## 2013-08-30 DIAGNOSIS — E782 Mixed hyperlipidemia: Secondary | ICD-10-CM | POA: Diagnosis not present

## 2013-09-02 ENCOUNTER — Encounter: Payer: Self-pay | Admitting: Cardiothoracic Surgery

## 2013-09-02 ENCOUNTER — Ambulatory Visit (INDEPENDENT_AMBULATORY_CARE_PROVIDER_SITE_OTHER): Payer: Self-pay | Admitting: Cardiothoracic Surgery

## 2013-09-02 VITALS — BP 140/77 | HR 62 | Resp 16 | Ht 68.0 in | Wt 150.0 lb

## 2013-09-02 DIAGNOSIS — I350 Nonrheumatic aortic (valve) stenosis: Secondary | ICD-10-CM

## 2013-09-02 DIAGNOSIS — Z09 Encounter for follow-up examination after completed treatment for conditions other than malignant neoplasm: Secondary | ICD-10-CM

## 2013-09-02 DIAGNOSIS — Z951 Presence of aortocoronary bypass graft: Secondary | ICD-10-CM

## 2013-09-02 DIAGNOSIS — Z954 Presence of other heart-valve replacement: Secondary | ICD-10-CM

## 2013-09-02 DIAGNOSIS — I359 Nonrheumatic aortic valve disorder, unspecified: Secondary | ICD-10-CM

## 2013-09-02 DIAGNOSIS — Z952 Presence of prosthetic heart valve: Secondary | ICD-10-CM

## 2013-09-02 DIAGNOSIS — I2510 Atherosclerotic heart disease of native coronary artery without angina pectoris: Secondary | ICD-10-CM

## 2013-09-02 DIAGNOSIS — R911 Solitary pulmonary nodule: Secondary | ICD-10-CM

## 2013-09-02 NOTE — Patient Instructions (Signed)

## 2013-09-02 NOTE — Progress Notes (Addendum)
[address]       [place] [address]             [phone]                    [facility] Medical Record #[id number] Date of Birth: 05-03-1924  Joseph Schultz, MD Georgann Housekeeper, MD  Chief Complaint:   PostOp Follow Up Visit  06/03/2013  DATE OF DISCHARGE:  OPERATIVE REPORT  PREOPERATIVE DIAGNOSES: Critical aortic stenosis with acute and chronic  systolic heart failure and coronary occlusive disease and right lung  nodule.  POSTOPERATIVE DIAGNOSES: Critical aortic stenosis with acute and  chronic systolic heart failure and coronary occlusive disease and right  lung nodule.  SURGICAL PROCEDURE:  1. Aortic valve replacement with pericardial tissue valve, [employer], model #3300 TFX 25 mm, serial [id number].  2. Coronary artery bypass grafting x1 with the reverse saphenous vein  graft to the intermediate coronary artery with left thigh  endo vein harvesting  3. Wedge resection of right lung lesion.  SURGEON: Sheliah Plane, MD   Path: Diagnosis 1. Lung, biopsy, Right lung - FINDINGS CONSISTENT WITH BENIGN PULMONARY HAMARTOMA / CHONDROMA. - NO EVIDENCE OF MALIGNANCY. 2. Heart valve leaflets, aortic - BENIGN VALVULAR TISSUE WITH EMBEDDED CALCIFICATION AND SCATTERED MILD CHRONIC INFLAMMATION. - NO TUMOR SEEN.   History of Present Illness:     Patient now returns for followup office visit after aortic valve replacement and wedge resection of the right lung. 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 He cleared with diuresis and course of steroids. He now is back living at home and independent . He is made Significant progress since I last saw him. He still has some lower extremity edema. Overall his respiratory status is markedly improved.   History  Smoking status  .  Former Smoker -- 1.50 packs/day for 20 years  . Types: Cigarettes  . Quit date: 10/29/1967  Smokeless tobacco  . Never Used       No Known Allergies  Current Outpatient Prescriptions  Medication Sig Dispense Refill  . aspirin EC 325 MG EC tablet Take 1 tablet (325 mg total) by mouth daily.  30 tablet  0  . atorvastatin (LIPITOR) 20 MG tablet Take 1 tablet (20 mg total) by mouth daily at 6 PM.      . ferrous fumarate (HEMOCYTE - 106 MG FE) 325 (106 FE) MG TABS tablet Take 1 tablet (106 mg of iron total) by mouth 2 (two) times daily.  30 each  0  . fluticasone (FLONASE) 50 MCG/ACT nasal spray Place 2 sprays into the nose daily as needed for allergies.       . folic acid (FOLVITE) 1 MG tablet Take 1 tablet (1 mg total) by mouth daily.      . furosemide (LASIX) 40 MG tablet Take 40 mg by mouth daily. Take one and a half tabs a day = 60 mgs      . loratadine (CLARITIN) 10 MG tablet Take 10 mg by mouth daily as needed for allergies.       . magnesium hydroxide (MILK OF MAGNESIA) 400 MG/5ML suspension Take 15 mLs by mouth daily as needed.  360 mL  0  . Multiple Vitamin (MULTIVITAMIN WITH MINERALS) TABS Take 1 tablet by mouth  daily.      . potassium chloride SA (K-DUR,KLOR-CON) 20 MEQ tablet Take 2 tablets (40 mEq total) by mouth daily.  30 tablet  12  . silodosin (RAPAFLO) 4 MG CAPS capsule Take 4 mg by mouth daily with breakfast.      . solifenacin (VESICARE) 5 MG tablet Take 5 mg by mouth daily.      . traMADol (ULTRAM) 50 MG tablet Take 1 tablet (50 mg total) by mouth every 6 (six) hours as needed.  120 tablet  5   No current facility-administered medications for this visit.       Physical Exam: BP 140/77  Pulse 62  Resp 16  Ht 5\' 8"  (1.727 m)  Wt 150 lb (68.04 kg)  BMI 22.81 kg/m2  SpO2 97%  General appearance: alert, cooperative and appears stated age Neurologic: intact Heart: regular rate and rhythm, S1, S2 normal, no murmur, click, rub or gallop Lungs: clear to  auscultation bilaterally and normal percussion bilaterally Abdomen: soft, non-tender; bowel sounds normal; no masses,  no organomegaly Extremities: edema 1-2+ edema both ankles Wound: Sternum is stable and well healed   Diagnostic Studies & Laboratory data:         Recent Radiology Findings: No results found.    Recent Labs: Lab Results  Component Value Date   WBC 11.3* 06/28/2013   HGB 10.5* 06/28/2013   HCT 30.6* 06/28/2013   PLT 241 06/28/2013   GLUCOSE 85 07/02/2013   CHOL 120 06/04/2013   TRIG 51 06/04/2013   HDL 50 06/04/2013   LDLCALC 60 06/04/2013   ALT 43 06/25/2013   AST 55* 06/25/2013   NA 132* 07/02/2013   K 3.8 07/02/2013   CL 97 07/02/2013   CREATININE 0.99 07/02/2013   BUN 26* 07/02/2013   CO2 29 07/02/2013   TSH 2.039 06/20/2013   INR 1.32 06/20/2013   HGBA1C 5.3 06/04/2013      Assessment / Plan:      Patient improving after recent aortic valve replacement and wedge resection of the right lung for hamartoma. Doing well at home now    Jacklyn Branan B 09/02/2013 1:16 PM

## 2013-09-10 ENCOUNTER — Ambulatory Visit: Payer: Medicare Other | Admitting: Cardiology

## 2013-09-14 ENCOUNTER — Telehealth: Payer: Self-pay | Admitting: Cardiology

## 2013-09-14 ENCOUNTER — Ambulatory Visit (INDEPENDENT_AMBULATORY_CARE_PROVIDER_SITE_OTHER): Payer: Medicare Other | Admitting: Urology

## 2013-09-14 DIAGNOSIS — M545 Low back pain: Secondary | ICD-10-CM | POA: Diagnosis not present

## 2013-09-14 NOTE — Telephone Encounter (Signed)
New message     Talk to you about taking a medication  that Dr Donette Larry prescribed.  He said he has had heart surgery and want to make sure it is ok to take.

## 2013-09-14 NOTE — Telephone Encounter (Signed)
Follow Up:  Pt states he is still waiting to hear from the doctor/nurse.. Pt states he has questions about his muscle relaxer medication. Please advise

## 2013-09-15 DIAGNOSIS — H8110 Benign paroxysmal vertigo, unspecified ear: Secondary | ICD-10-CM | POA: Diagnosis not present

## 2013-09-15 DIAGNOSIS — L989 Disorder of the skin and subcutaneous tissue, unspecified: Secondary | ICD-10-CM | POA: Diagnosis not present

## 2013-09-15 DIAGNOSIS — R42 Dizziness and giddiness: Secondary | ICD-10-CM | POA: Diagnosis not present

## 2013-09-15 NOTE — Telephone Encounter (Signed)
Spoke with spouse, she was uncertain of exactly what type of pain her husband may have had, she just knew it was painful, after applying heating pad he feels much better, Advised that I would call later today to check on her spouse when he gets home to obtain a better understanding to make sure is not heart related.

## 2013-10-04 NOTE — Telephone Encounter (Signed)
Issue resolved.

## 2013-10-13 ENCOUNTER — Encounter: Payer: Self-pay | Admitting: Cardiology

## 2013-10-13 ENCOUNTER — Encounter (INDEPENDENT_AMBULATORY_CARE_PROVIDER_SITE_OTHER): Payer: Self-pay

## 2013-10-13 ENCOUNTER — Ambulatory Visit (INDEPENDENT_AMBULATORY_CARE_PROVIDER_SITE_OTHER): Payer: Medicare Other | Admitting: Cardiology

## 2013-10-13 VITALS — BP 130/70 | HR 77 | Ht 68.0 in | Wt 156.0 lb

## 2013-10-13 DIAGNOSIS — Z952 Presence of prosthetic heart valve: Secondary | ICD-10-CM

## 2013-10-13 DIAGNOSIS — I5032 Chronic diastolic (congestive) heart failure: Secondary | ICD-10-CM

## 2013-10-13 DIAGNOSIS — R29898 Other symptoms and signs involving the musculoskeletal system: Secondary | ICD-10-CM

## 2013-10-13 DIAGNOSIS — M6250 Muscle wasting and atrophy, not elsewhere classified, unspecified site: Secondary | ICD-10-CM | POA: Diagnosis not present

## 2013-10-13 DIAGNOSIS — Z954 Presence of other heart-valve replacement: Secondary | ICD-10-CM

## 2013-10-13 NOTE — Patient Instructions (Addendum)
Your physician recommends that you continue on your current medications as directed. Please refer to the Current Medication list given to you today.  Your physician recommends that you schedule a follow-up appointment in: 3 months with Dr. Skains  

## 2013-10-13 NOTE — Progress Notes (Signed)
[address] [place], [place]  [address] Phone: [phone] Fax:  [phone]  Date:  10/13/2013   ID:  Joseph Garner, DOB 21-Apr-1924, MRN [id number]  PCP:  Georgann Housekeeper, MD   History of Present Illness: Joseph Garner is a 79 y.o. male with bioprosthetic aortic valve replacement, with postoperative readmission to hospital secondary to acute diastolic heart failure, pneumonitis with cessation of amiodarone here for followup. He has been seen at [facility] rehabilitation.  Mild SOB. Compression hose.  His wife is concerned about him coming back home, she is nervous about this. We spoke at length about restrictions such as driving.  Hard for him to stay in [place], gets dizzy. Bright lights, floor bright. Had back/ flank pain spasm. Saw Dr. Audelia Acton. Took tylenol and better.     Wt Readings from Last 3 Encounters:  10/13/13 156 lb (70.761 kg)  09/02/13 150 lb (68.04 kg)  08/11/13 150 lb (68.04 kg)     Past Medical History  Diagnosis Date  . BPH (benign prostatic hypertrophy)   . Diverticulosis 2006  . Depression   . Anemia   . Spinal stenosis   . Lung nodules 2010  . Actinic keratosis, hx of   . Macular degeneration   . Hypertension   . Allergy     allergic rhinitis  . Hearing loss     wears bilateral hearing aids  . Inguinal hernia     left side -surgery planned  . GERD (gastroesophageal reflux disease)   . Arthritis     generalized-more back issues  . Severe aortic stenosis 04/16/2013  . Nodule of right lung 05/24/2013  . CHF (congestive heart failure)   . Shortness of breath   . Atrial flutter   . Chronic diastolic heart failure 08/11/2013    Past Surgical History  Procedure Laterality Date  . Tonsillectomy  1933  . Appendectomy  1947  . Neck surgery  1997    compressed nerve  . Skin biopsy  2003-2011    multiple   . Cataract extraction, bilateral      bilateral  . Inguinal hernia repair Left 02/11/2013    Procedure: LEFT  LAPAROSCOPIC REPAIR INGUINAL HERNIA WITH MESH;  Surgeon: Ernestene Mention, MD;  [facility];  Service: General;  Laterality: Left;  . Insertion of mesh Left 02/11/2013    Procedure: INSERTION OF MESH;  Surgeon: Ernestene Mention, MD;  [facility];  Service: General;  Laterality: Left;  . Hernia repair  02/24/13    lap LIH repair  . Aortic valve replacement N/A 06/04/2013    Procedure: AORTIC VALVE REPLACEMENT (AVR);  Surgeon: Delight Ovens, MD;  Location: [facility] OR;  Service: Open Heart Surgery;  Laterality: N/A;  . Intraoperative transesophageal echocardiogram N/A 06/04/2013    Procedure: INTRAOPERATIVE TRANSESOPHAGEAL ECHOCARDIOGRAM;  Surgeon: Delight Ovens, MD;  Location: [facility] OR;  Service: Open Heart Surgery;  Laterality: N/A;  . Coronary artery bypass graft N/A 06/04/2013    Procedure: CORONARY ARTERY BYPASS GRAFTING (CABG);  Surgeon: Delight Ovens, MD;  Location: [facility] OR;  Service: Open Heart Surgery;  Laterality: N/A;  . Wedge resection Right 06/04/2013    Procedure: WEDGE RESECTION Right Lower Lobe;  Surgeon: Delight Ovens, MD;  Location[facility] OR;  Service: Open Heart Surgery;  Laterality: Right;    Prior to Admission medications   Medication Sig Start Date End Date Taking? Authorizing Provider  aspirin EC 325 MG EC  tablet Take 1 tablet (325 mg total) by mouth daily. 06/15/13  Yes Erin Barrett, PA-C  atorvastatin (LIPITOR) 20 MG tablet Take 1 tablet (20 mg total) by mouth daily at 6 PM. 06/15/13  Yes Erin Barrett, PA-C  ferrous fumarate (HEMOCYTE - 106 MG FE) 325 (106 FE) MG TABS tablet Take 1 tablet (106 mg of iron total) by mouth 2 (two) times daily. 06/15/13  Yes Erin Barrett, PA-C  fluticasone (FLONASE) 50 MCG/ACT nasal spray Place 2 sprays into the nose daily as needed for allergies.    Yes Historical Provider, MD  folic acid (FOLVITE) 1 MG tablet Take 1 tablet (1 mg total) by mouth daily. 06/15/13  Yes Erin Barrett, PA-C  furosemide (LASIX) 20 MG tablet Take3 pills daily   Yes  Historical Provider, MD  loratadine (CLARITIN) 10 MG tablet Take 10 mg by mouth daily as needed for allergies.    Yes Historical Provider, MD  magnesium hydroxide (MILK OF MAGNESIA) 400 MG/5ML suspension Take 15 mLs by mouth daily as needed. 07/02/13  Yes Donato Schultz, MD  Multiple Vitamin (MULTIVITAMIN WITH MINERALS) TABS Take 1 tablet by mouth daily.   Yes Historical Provider, MD  omeprazole (PRILOSEC) 20 MG capsule Take 20 mg by mouth daily.   Yes Historical Provider, MD  potassium chloride SA (K-DUR,KLOR-CON) 20 MEQ tablet Take 2 tablets (40 mEq total) by mouth daily. 07/02/13  Yes Donato Schultz, MD  silodosin (RAPAFLO) 4 MG CAPS capsule Take 8 mg by mouth daily with breakfast.    Yes Historical Provider, MD  solifenacin (VESICARE) 5 MG tablet Take 5 mg by mouth daily.   Yes Historical Provider, MD  traMADol (ULTRAM) 50 MG tablet Take 1 tablet (50 mg total) by mouth every 6 (six) hours as needed. 07/05/13  Yes Claudie Revering, NP       Allergies:   No Known Allergies  Social History:  The patient  reports that he quit smoking about 45 years ago. His smoking use included Cigarettes. He has a 30 pack-year smoking history. He has never used smokeless tobacco. He reports that he drinks alcohol. He reports that he does not use illicit drugs.   ROS:  Please see the history of present illness.   Denies any chest pain, syncope, fevers, orthopnea, PND.   All other systems reviewed and negative. Occasional flank pain/musculoskeletal discomfort.  PHYSICAL EXAM: VS:  BP 130/70  Pulse 77  Ht 5\' 8"  (1.727 m)  Wt 156 lb (70.761 kg)  BMI 23.73 kg/m2 Well nourished, well developed, in no acute distressElderly HEENT: normal Neck: no JVD Cardiac:  normal S1, S2; RRR; soft systolic murmur Lungs:  clear to auscultation bilaterally, no wheezing, rhonchi or rales Abd: soft, nontender, no hepatomegaly Ext: no edemaCompression hose in placemultiple ecchymosis in place. Skin: warm and dry Neuro: no focal  abnormalities noted, anxious      ASSESSMENT AND PLAN:  1. Aortic stenosis/bioprosthetic aortic valve replacement-long rehabilitation efforts. Continue with current medications. Overall strength has improved. Pleased with his progress. 2. Diastolic heart failure-currently well compensated. Continue with Lasix for now. 3. Currently his exercise effort is much improved. He would like to go to cardiac rehabilitation and he can if possible maybe in January. He will let me know. He had multiple questions asked and answered. 4. I'm fine with him changing his aspirin 81 mg a day. Multiple ecchymosis on arms. 5. We will see him back in 3 months.  Signed, Donato Schultz, MD [facility]  10/13/2013 2:08 PM

## 2013-10-23 IMAGING — CR DG CHEST 1V
1 series · 1 of 1 positions shown · non-contrast
Comparison: 06/20/2013

CLINICAL DATA: Post thoracentesis, right-sided

CHEST - 1 VIEW

[x chest ap]
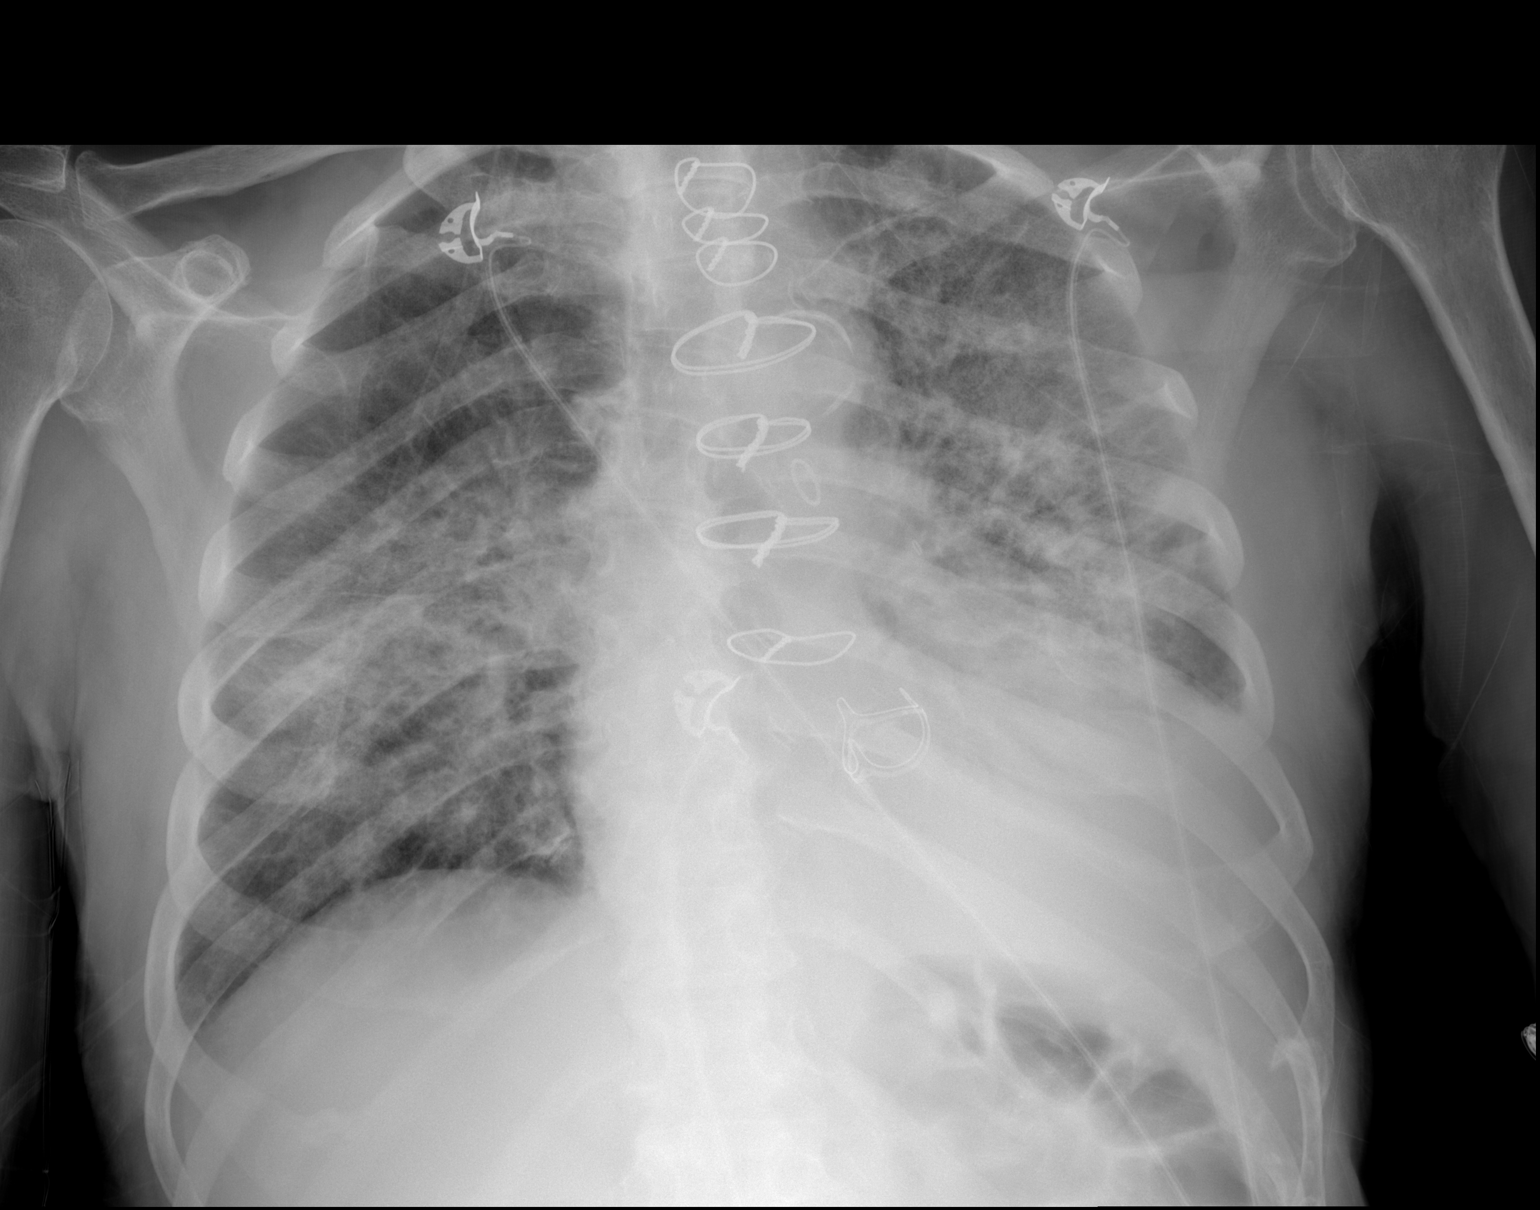

[1 of 1 positions shown; findings below may reference images not displayed]

FINDINGS: Decrease in right pleural effusion.  No significant
residual effusion on the right.  No pneumothorax.

Progression of diffuse pulmonary edema since yesterday.  There
remains left lower lobe atelectasis and effusion, unchanged.
IMPRESSION: Negative for pneumothorax post right thoracentesis

Progression of pulmonary edema since yesterday.

## 2013-11-03 DIAGNOSIS — H353190 Nonexudative age-related macular degeneration, unspecified eye, stage unspecified: Secondary | ICD-10-CM | POA: Diagnosis not present

## 2013-11-03 DIAGNOSIS — H52209 Unspecified astigmatism, unspecified eye: Secondary | ICD-10-CM | POA: Diagnosis not present

## 2013-11-03 DIAGNOSIS — H2640 Unspecified secondary cataract: Secondary | ICD-10-CM | POA: Diagnosis not present

## 2013-11-03 DIAGNOSIS — Z961 Presence of intraocular lens: Secondary | ICD-10-CM | POA: Diagnosis not present

## 2013-12-10 IMAGING — CR DG CHEST 2V
3 series · 3 of 3 positions shown · non-contrast
Comparison: 08/01/2013.

CLINICAL DATA: Shortness of breath. Fluid retention.

EXAM:
CHEST  2 VIEW

[view not recorded (1 of 3)]
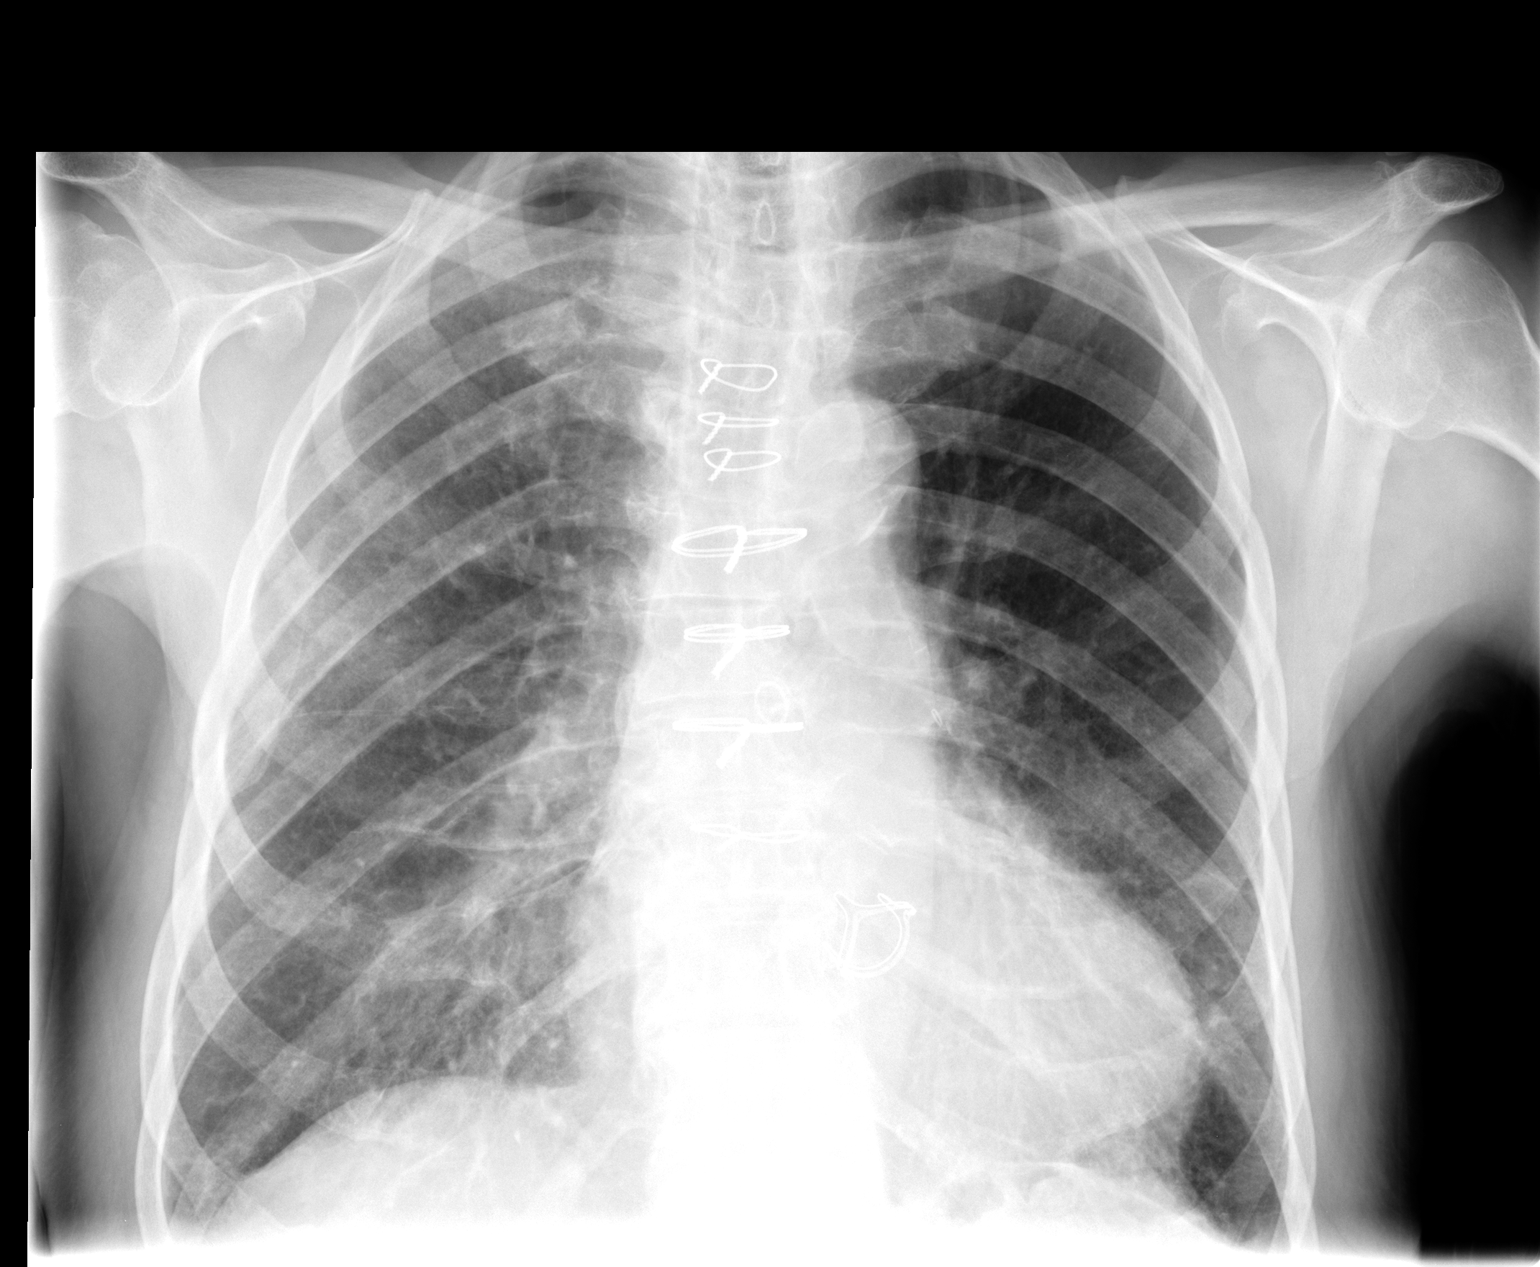

[view not recorded (2 of 3)]
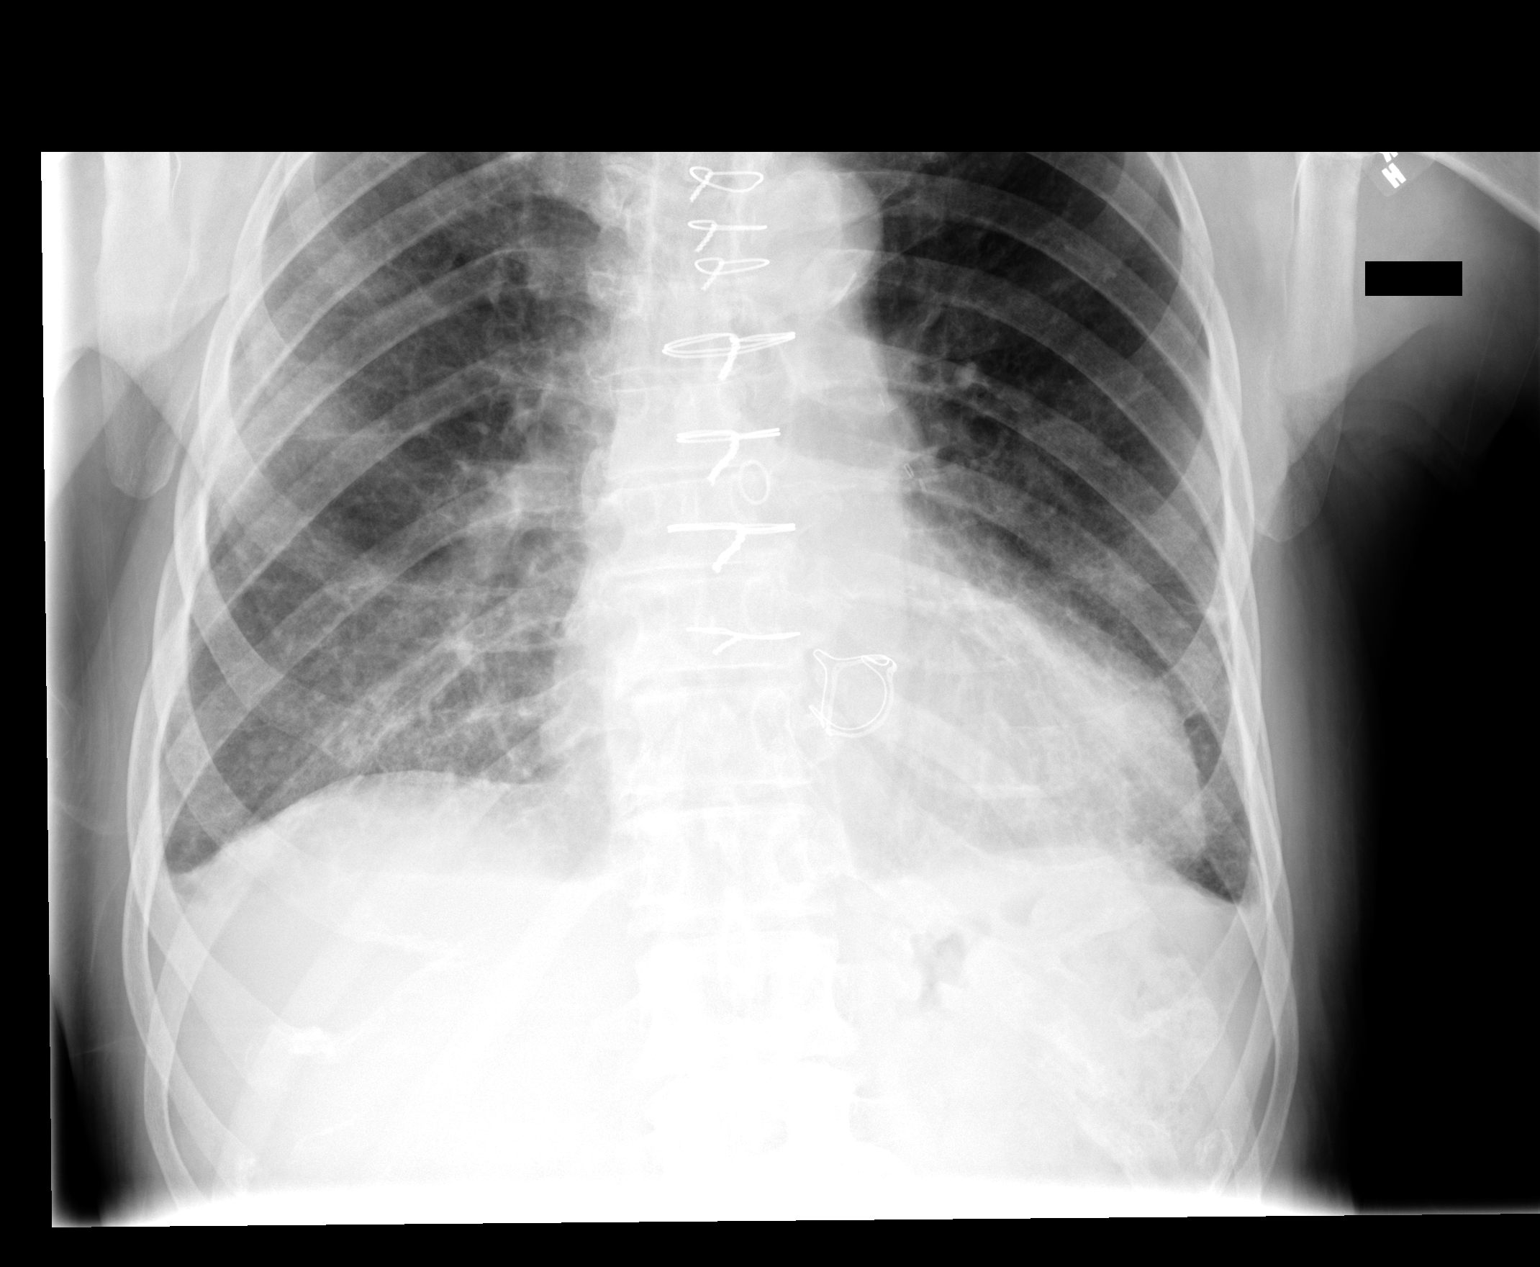

[view not recorded (3 of 3)]
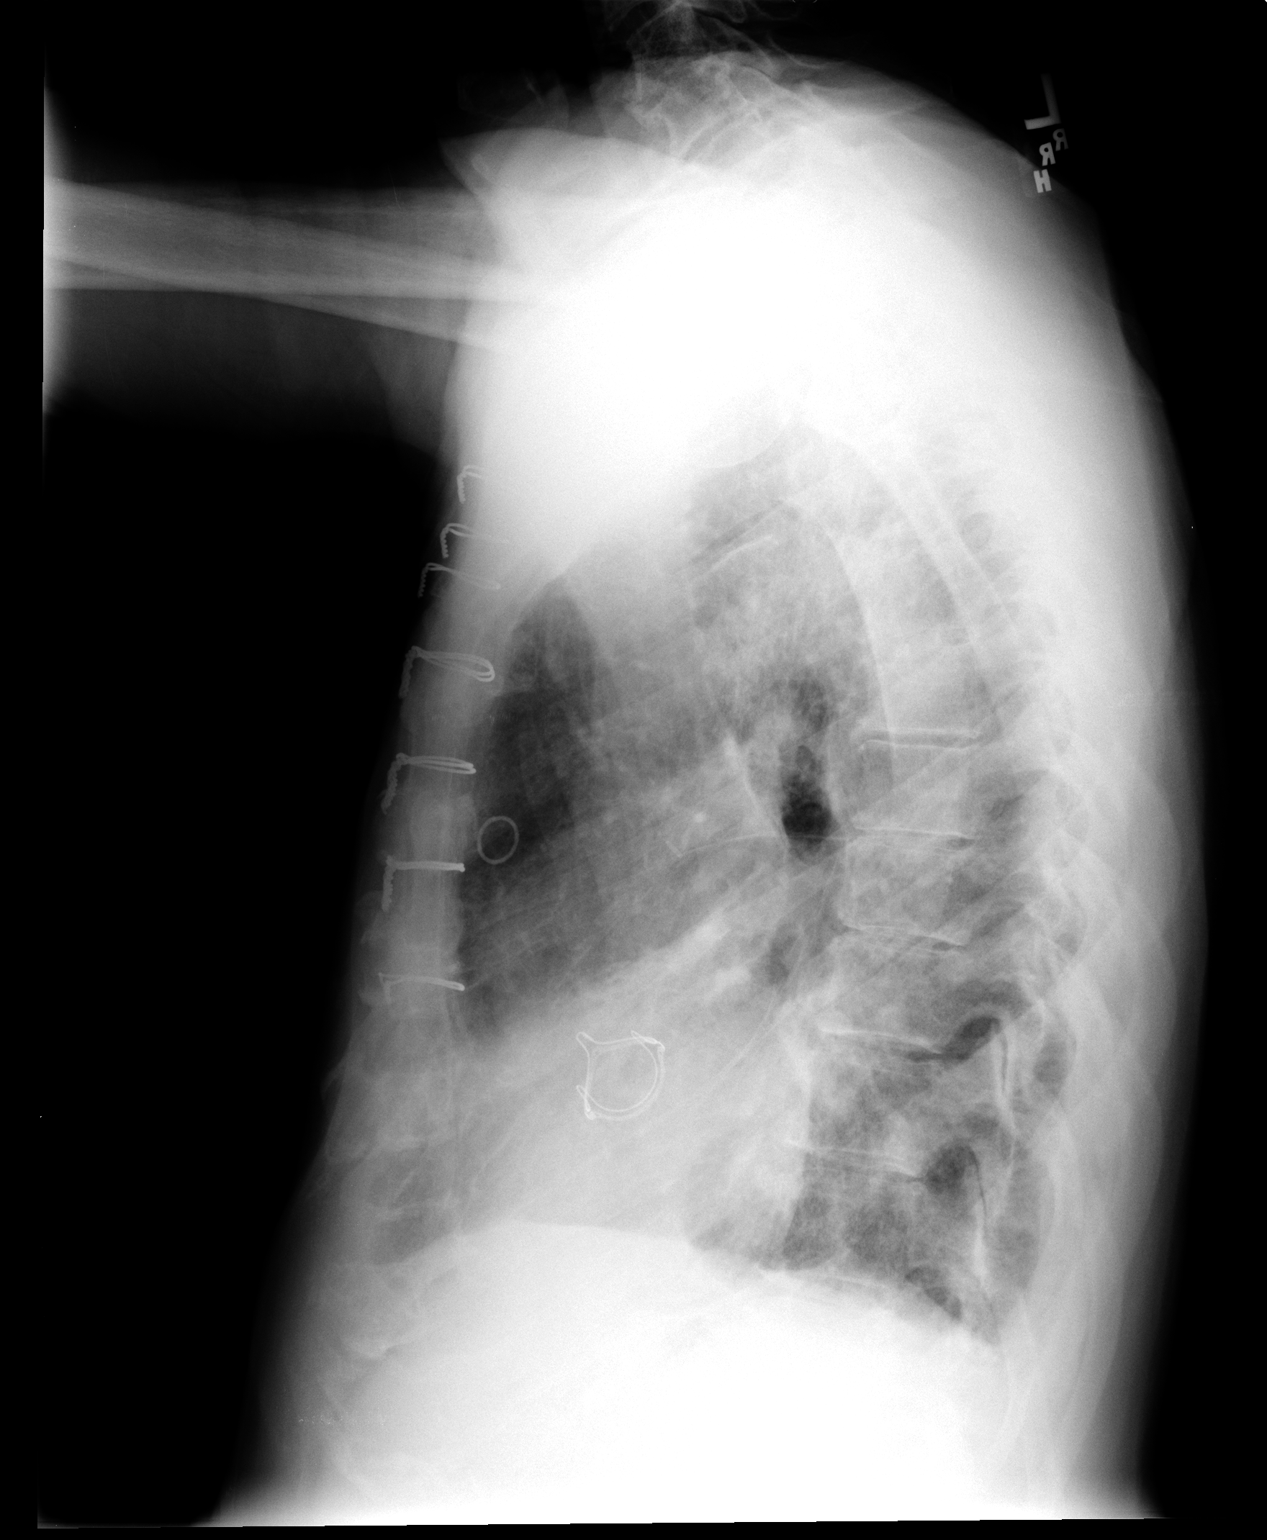

[3 of 3 positions shown; findings below may reference images not displayed]

FINDINGS: Stable enlargement of the cardiac silhouette, post CABG changes and
prosthetic heart valve. The interstitial markings remain mildly
prominent and the lungs remain mildly hyperexpanded. Unremarkable
bones.
IMPRESSION: No acute abnormality. Stable mild cardiomegaly and mild changes of
COPD.

## 2013-12-30 DIAGNOSIS — L259 Unspecified contact dermatitis, unspecified cause: Secondary | ICD-10-CM | POA: Diagnosis not present

## 2014-01-10 ENCOUNTER — Encounter: Payer: Self-pay | Admitting: Cardiology

## 2014-01-10 ENCOUNTER — Ambulatory Visit (INDEPENDENT_AMBULATORY_CARE_PROVIDER_SITE_OTHER): Payer: Medicare Other | Admitting: Cardiology

## 2014-01-10 VITALS — BP 110/64 | HR 56 | Ht 68.0 in | Wt 163.0 lb

## 2014-01-10 DIAGNOSIS — Z952 Presence of prosthetic heart valve: Secondary | ICD-10-CM

## 2014-01-10 DIAGNOSIS — I25810 Atherosclerosis of coronary artery bypass graft(s) without angina pectoris: Secondary | ICD-10-CM | POA: Diagnosis not present

## 2014-01-10 DIAGNOSIS — I471 Supraventricular tachycardia, unspecified: Secondary | ICD-10-CM

## 2014-01-10 DIAGNOSIS — I5032 Chronic diastolic (congestive) heart failure: Secondary | ICD-10-CM

## 2014-01-10 DIAGNOSIS — I5031 Acute diastolic (congestive) heart failure: Secondary | ICD-10-CM | POA: Diagnosis not present

## 2014-01-10 DIAGNOSIS — I498 Other specified cardiac arrhythmias: Secondary | ICD-10-CM

## 2014-01-10 DIAGNOSIS — I509 Heart failure, unspecified: Secondary | ICD-10-CM | POA: Diagnosis not present

## 2014-01-10 DIAGNOSIS — Z951 Presence of aortocoronary bypass graft: Secondary | ICD-10-CM

## 2014-01-10 DIAGNOSIS — Z954 Presence of other heart-valve replacement: Secondary | ICD-10-CM

## 2014-01-10 LAB — CBC WITH DIFFERENTIAL/PLATELET
BASOS PCT: 0.6 % (ref 0.0–3.0)
Basophils Absolute: 0 10*3/uL (ref 0.0–0.1)
EOS ABS: 0.6 10*3/uL (ref 0.0–0.7)
EOS PCT: 7.6 % — AB (ref 0.0–5.0)
HCT: 30.8 % — ABNORMAL LOW (ref 39.0–52.0)
Hemoglobin: 10.3 g/dL — ABNORMAL LOW (ref 13.0–17.0)
LYMPHS PCT: 22.3 % (ref 12.0–46.0)
Lymphs Abs: 1.8 10*3/uL (ref 0.7–4.0)
MCHC: 33.5 g/dL (ref 30.0–36.0)
MCV: 98.5 fl (ref 78.0–100.0)
MONO ABS: 1.1 10*3/uL — AB (ref 0.1–1.0)
Monocytes Relative: 13 % — ABNORMAL HIGH (ref 3.0–12.0)
NEUTROS PCT: 56.5 % (ref 43.0–77.0)
Neutro Abs: 4.6 10*3/uL (ref 1.4–7.7)
Platelets: 162 10*3/uL (ref 150.0–400.0)
RBC: 3.13 Mil/uL — AB (ref 4.22–5.81)
RDW: 15.7 % — ABNORMAL HIGH (ref 11.5–14.6)
WBC: 8.2 10*3/uL (ref 4.5–10.5)

## 2014-01-10 LAB — BASIC METABOLIC PANEL
BUN: 24 mg/dL — AB (ref 6–23)
CO2: 28 mEq/L (ref 19–32)
Calcium: 8.5 mg/dL (ref 8.4–10.5)
Chloride: 100 mEq/L (ref 96–112)
Creatinine, Ser: 1.3 mg/dL (ref 0.4–1.5)
GFR: 53.94 mL/min — AB (ref >60.00)
Glucose, Bld: 86 mg/dL (ref 70–99)
POTASSIUM: 4.5 meq/L (ref 3.5–5.1)
SODIUM: 134 meq/L — AB (ref 135–145)

## 2014-01-10 MED ORDER — FUROSEMIDE 20 MG PO TABS: 20.0000 mg | ORAL_TABLET | Freq: Two times a day (BID) | ORAL | Status: DC

## 2014-01-10 MED ORDER — FUROSEMIDE 20 MG PO TABS: 40.0000 mg | ORAL_TABLET | Freq: Every day | ORAL | Status: DC

## 2014-01-10 NOTE — Progress Notes (Signed)
Joseph Garner. [address], [address] [place], [place]  [address] Phone: [phone] Fax:  [phone]  Date:  01/10/2014   ID:  Pervis Hocking, DOB Oct 05, 1924, MRN [id number]  PCP:  Wenda Low, MD   History of Present Illness: Chanz Cahall is a 80 y.o. male with bioprosthetic aortic valve replacement 06/04/13, with postoperative readmission to hospital secondary to acute diastolic heart failure, pneumonitis with cessation of amiodarone here for followup. He had prolonged stay at [facility] rehabilitation.  Mild SOB. Compression hose. No significant chest pain. He does not feel as though he is back to 100 percent.  Hard for him [place] stay [place], gets dizzy. Bright lights, floor bright. Had back/ flank pain spasm. Saw Dr. Teresa Pelton. Took tylenol and better.     Wt Readings from Last 3 Encounters:  01/10/14 163 lb (73.936 kg)  10/13/13 156 lb (70.761 kg)  09/02/13 150 lb (68.04 kg)     Past Medical History  Diagnosis Date  . BPH (benign prostatic hypertrophy)   . Diverticulosis 2006  . Depression   . Anemia   . Spinal stenosis   . Lung nodules 2010  . Actinic keratosis, hx of   . Macular degeneration   . Hypertension   . Allergy     allergic rhinitis  . Hearing loss     wears bilateral hearing aids  . Inguinal hernia     left side -surgery planned  . GERD (gastroesophageal reflux disease)   . Arthritis     generalized-more back issues  . Severe aortic stenosis 04/16/2013  . Nodule of right lung 05/24/2013  . CHF (congestive heart failure)   . Shortness of breath   . Atrial flutter   . Chronic diastolic heart failure 67/34/1937    Past Surgical History  Procedure Laterality Date  . Tonsillectomy  1933  . Appendectomy  1947  . Neck surgery  1997    compressed nerve  . Skin biopsy  2003-2011    multiple   . Cataract extraction, bilateral      bilateral  . Inguinal hernia repair Left 02/11/2013    Procedure: LEFT LAPAROSCOPIC REPAIR INGUINAL  HERNIA WITH MESH;  Surgeon: Adin Hector, MD;  [facility];  Service: General;  Laterality: Left;  . Insertion of mesh Left 02/11/2013    Procedure: INSERTION OF MESH;  Surgeon: Adin Hector, MD;  [facility];  Service: General;  Laterality: Left;  . Hernia repair  02/24/13    lap LIH repair  . Aortic valve replacement N/A 06/04/2013    Procedure: AORTIC VALVE REPLACEMENT (AVR);  Surgeon: Grace Isaac, MD;  Location: [place];  Service: Open Heart Surgery;  Laterality: N/A;  . Intraoperative transesophageal echocardiogram N/A 06/04/2013    Procedure: INTRAOPERATIVE TRANSESOPHAGEAL ECHOCARDIOGRAM;  Surgeon: Grace Isaac, MD;  Location: [place];  Service: Open Heart Surgery;  Laterality: N/A;  . Coronary artery bypass graft N/A 06/04/2013    Procedure: CORONARY ARTERY BYPASS GRAFTING (CABG);  Surgeon: Grace Isaac, MD;  Location: [place];  Service: Open Heart Surgery;  Laterality: N/A;  . Wedge resection Right 06/04/2013    Procedure: WEDGE RESECTION Right Lower Lobe;  Surgeon: Grace Isaac, MD;  Location: [place];  Service: Open Heart Surgery;  Laterality: Right;   Prior to Admission medications   Medication Sig Start Date End Date Taking? Authorizing Provider  aspirin EC 325 MG EC tablet Take 1 tablet (325 mg  total) by mouth daily. 06/15/13  Yes Erin Barrett, PA-C  atorvastatin (LIPITOR) 20 MG tablet Take 1 tablet (20 mg total) by mouth daily at 6 PM. 06/15/13  Yes Erin Barrett, PA-C  ferrous fumarate (HEMOCYTE - 106 MG FE) 325 (106 FE) MG TABS tablet Take 1 tablet (106 mg of iron total) by mouth 2 (two) times daily. 06/15/13  Yes Erin Barrett, PA-C  fluticasone (FLONASE) 50 MCG/ACT nasal spray Place 2 sprays into the nose daily as needed for allergies.    Yes Historical Provider, MD  folic acid (FOLVITE) 1 MG tablet Take 1 tablet (1 mg total) by mouth daily. 06/15/13  Yes Erin Barrett, PA-C  furosemide (LASIX) 20 MG tablet Take3 pills daily   Yes Historical Provider, MD    loratadine (CLARITIN) 10 MG tablet Take 10 mg by mouth daily as needed for allergies.    Yes Historical Provider, MD  magnesium hydroxide (MILK OF MAGNESIA) 400 MG/5ML suspension Take 15 mLs by mouth daily as needed. 07/02/13  Yes Candee Furbish, MD  Multiple Vitamin (MULTIVITAMIN WITH MINERALS) TABS Take 1 tablet by mouth daily.   Yes Historical Provider, MD  omeprazole (PRILOSEC) 20 MG capsule Take 20 mg by mouth daily.   Yes Historical Provider, MD  potassium chloride SA (K-DUR,KLOR-CON) 20 MEQ tablet Take 2 tablets (40 mEq total) by mouth daily. 07/02/13  Yes Candee Furbish, MD  silodosin (RAPAFLO) 4 MG CAPS capsule Take 8 mg by mouth daily with breakfast.    Yes Historical Provider, MD  solifenacin (VESICARE) 5 MG tablet Take 5 mg by mouth daily.   Yes Historical Provider, MD  traMADol (ULTRAM) 50 MG tablet Take 1 tablet (50 mg total) by mouth every 6 (six) hours as needed. 07/05/13  Yes Pricilla Larsson, NP       Allergies:   No Known Allergies  Social History:  The patient  reports that he quit smoking about 46 years ago. His smoking use included Cigarettes. He has a 30 pack-year smoking history. He has never used smokeless tobacco. He reports that he drinks alcohol. He reports that he does not use illicit drugs.   ROS:  Please see the history of present illness.   Denies any chest pain, syncope, fevers, orthopnea, PND.   All other systems reviewed and negative. Occasional flank pain/musculoskeletal discomfort.  PHYSICAL EXAM: VS:  BP 110/64  Pulse 56  Ht 5\' 8"  (1.727 m)  Wt 163 lb (73.936 kg)  BMI 24.79 kg/m2 Well nourished, well developed, in no acute distressElderly HEENT: normal Neck: no JVD Cardiac:  normal S1, S2; RRR; soft systolic murmur Lungs:  clear to auscultation bilaterally, no wheezing, rhonchi or rales Abd: soft, nontender, no hepatomegaly Ext: no edemaCompression hose in place  Skin: warm and dry Neuro: no focal abnormalities noted, anxious  Echocardiogram  06/21/13-normal functioning bioprosthetic valve. Normal EF.     ASSESSMENT AND PLAN:  1. Aortic stenosis/bioprosthetic aortic valve replacement-long rehabilitation efforts at [facility]. Will decrease Lasix to 40 mg once a day. Overall strength has improved. He still does not feel back to normal. Encouraged continued walking. He walks approximately 3/4 of a mile with his wife. He could consider [employer]. Pleased with his progress. 2. Diastolic heart failure-currently well compensated. Continue with Lasix for now but will decrease dosage, 40 mg. Had multiple questions asked and answered. We will check basic metabolic profile and CBC 3. Aspirin 81 mg a day. Prior ecchymosis on arms. 4. CAD-bypass times one. ASA.  5. We will  see him back in 6 months.  Signed, Candee Furbish, MD [facility]  01/10/2014 11:04 AM

## 2014-01-10 NOTE — Patient Instructions (Addendum)
Your physician has recommended you make the following change in your medication:   1. Decrease Lasix to 40 mg daily.  Your physician recommends that you have labs today: BMET, CBC  Your physician wants you to follow-up in: 6 months with Dr. Marlou Porch. You will receive a reminder letter in the mail two months in advance. If you don't receive a letter, please call our office to schedule the follow-up appointment.

## 2014-01-11 ENCOUNTER — Telehealth: Payer: Self-pay | Admitting: Cardiology

## 2014-01-11 ENCOUNTER — Other Ambulatory Visit: Payer: Self-pay

## 2014-01-11 MED ORDER — FUROSEMIDE 40 MG PO TABS: 40.0000 mg | ORAL_TABLET | Freq: Every day | ORAL | Status: DC

## 2014-01-11 MED ORDER — POTASSIUM CHLORIDE CRYS ER 20 MEQ PO TBCR: 20.0000 meq | EXTENDED_RELEASE_TABLET | Freq: Every day | ORAL | Status: DC

## 2014-01-11 NOTE — Telephone Encounter (Signed)
Message copied by Alcario Drought on Tue Jan 11, 2014  4:29 PM ------      Message from: Candee Furbish C      Created: Mon Jan 10, 2014  1:46 PM       Since we are reducing his Lasix to 40 mg once a day, please reduce potassium supplementation to 20 mEq once a day. Lab work overall reassuring. Hemoglobin is stable. ------

## 2014-01-11 NOTE — Telephone Encounter (Signed)
Pt aware.

## 2014-03-01 DIAGNOSIS — H8110 Benign paroxysmal vertigo, unspecified ear: Secondary | ICD-10-CM | POA: Diagnosis not present

## 2014-03-01 DIAGNOSIS — K219 Gastro-esophageal reflux disease without esophagitis: Secondary | ICD-10-CM | POA: Diagnosis not present

## 2014-03-01 DIAGNOSIS — Z954 Presence of other heart-valve replacement: Secondary | ICD-10-CM | POA: Diagnosis not present

## 2014-03-01 DIAGNOSIS — E782 Mixed hyperlipidemia: Secondary | ICD-10-CM | POA: Diagnosis not present

## 2014-03-01 DIAGNOSIS — I10 Essential (primary) hypertension: Secondary | ICD-10-CM | POA: Diagnosis not present

## 2014-03-10 NOTE — Progress Notes (Signed)
This encounter was created in error - please disregard.

## 2014-03-28 DIAGNOSIS — C44319 Basal cell carcinoma of skin of other parts of face: Secondary | ICD-10-CM | POA: Diagnosis not present

## 2014-03-28 DIAGNOSIS — L570 Actinic keratosis: Secondary | ICD-10-CM | POA: Diagnosis not present

## 2014-06-09 ENCOUNTER — Telehealth: Payer: Self-pay | Admitting: Cardiology

## 2014-06-09 NOTE — Telephone Encounter (Signed)
Patient called reporting chest pain--left sided, "dull ache" 2/10. Does not radiate.  Skin is warm/dry. No SOB BP 140/64, HR 61.  He reports regular pulse (felt radial).  Has been out of omeprazole for several days.  I instructed him several times to go to the ED for evaluation of his chest pain. He refuses at this time.  He would like to see Dr. Marlou Porch tomorrow if possible.  I instructed him if he is not going to the ED at this time, he should take some Mylanta, start back on omeprazole asap. He verbalizes understanding. His wife is at home with him and is aware of his chest pain. Advised I will send this information to Dr. Marlou Porch.

## 2014-06-09 NOTE — Telephone Encounter (Signed)
Pt called reporting chest pain and same day ov// transferred to triage//

## 2014-06-10 ENCOUNTER — Encounter: Payer: Self-pay | Admitting: Cardiology

## 2014-06-10 ENCOUNTER — Ambulatory Visit (INDEPENDENT_AMBULATORY_CARE_PROVIDER_SITE_OTHER): Payer: Medicare Other | Admitting: Cardiology

## 2014-06-10 VITALS — BP 104/72 | HR 61 | Ht 68.0 in | Wt 153.0 lb

## 2014-06-10 DIAGNOSIS — R0789 Other chest pain: Secondary | ICD-10-CM

## 2014-06-10 DIAGNOSIS — I25810 Atherosclerosis of coronary artery bypass graft(s) without angina pectoris: Secondary | ICD-10-CM | POA: Diagnosis not present

## 2014-06-10 DIAGNOSIS — I2510 Atherosclerotic heart disease of native coronary artery without angina pectoris: Secondary | ICD-10-CM | POA: Diagnosis not present

## 2014-06-10 DIAGNOSIS — E7800 Pure hypercholesterolemia, unspecified: Secondary | ICD-10-CM

## 2014-06-10 NOTE — Progress Notes (Signed)
[place]. [address] [place], [place]  [address] Phone: [phone] Fax:  [phone]  Date:  06/10/2014   ID:  Joseph Garner, DOB February 04, 1924, MRN [id number]  PCP:  Wenda Low, MD   History of Present Illness: Jaycion Treml is a 80 y.o. male with bioprosthetic aortic valve replacement 06/04/13, with postoperative readmission to hospital secondary to acute diastolic heart failure, pneumonitis with cessation of amiodarone here for followup. He had prolonged stay at [facility] rehabilitation.  Hed bypass x1, diagonal branch.  He experienced some chest discomfort yesterday, mild in intensity. Dull ache. Now resolved. Fullness.   Mild SOB. Compression hose. No significant chest pain. He does not feel as though he is back to 100 percent.  Previously Hard for him to stay in [place], gets dizzy. Bright lights, floor bright. Had back/ flank pain spasm. Saw Dr. Teresa Pelton. Took tylenol and better.     Wt Readings from Last 3 Encounters:  06/10/14 153 lb (69.4 kg)  01/10/14 163 lb (73.936 kg)  10/13/13 156 lb (70.761 kg)     Past Medical History  Diagnosis Date  . BPH (benign prostatic hypertrophy)   . Diverticulosis 2006  . Depression   . Anemia   . Spinal stenosis   . Lung nodules 2010  . Actinic keratosis, hx of   . Macular degeneration   . Hypertension   . Allergy     allergic rhinitis  . Hearing loss     wears bilateral hearing aids  . Inguinal hernia     left side -surgery planned  . GERD (gastroesophageal reflux disease)   . Arthritis     generalized-more back issues  . Severe aortic stenosis 04/16/2013  . Nodule of right lung 05/24/2013  . CHF (congestive heart failure)   . Shortness of breath   . Atrial flutter   . Chronic diastolic heart failure 95/63/8756    Past Surgical History  Procedure Laterality Date  . Tonsillectomy  1933  . Appendectomy  1947  . Neck surgery  1997    compressed nerve  . Skin biopsy  2003-2011   multiple   . Cataract extraction, bilateral      bilateral  . Inguinal hernia repair Left 02/11/2013    Procedure: LEFT LAPAROSCOPIC REPAIR INGUINAL HERNIA WITH MESH;  Surgeon: Adin Hector, MD;  [facility];  Service: General;  Laterality: Left;  . Insertion of mesh Left 02/11/2013    Procedure: INSERTION OF MESH;  Surgeon: Adin Hector, MD;  [facility];  Service: General;  Laterality: Left;  . Hernia repair  02/24/13    lap LIH repair  . Aortic valve replacement N/A 06/04/2013    Procedure: AORTIC VALVE REPLACEMENT (AVR);  Surgeon: Grace Isaac, MD;  Location: [place];  Service: Open Heart Surgery;  Laterality: N/A;  . Intraoperative transesophageal echocardiogram N/A 06/04/2013    Procedure: INTRAOPERATIVE TRANSESOPHAGEAL ECHOCARDIOGRAM;  Surgeon: Grace Isaac, MD;  Location: [place];  Service: Open Heart Surgery;  Laterality: N/A;  . Coronary artery bypass graft N/A 06/04/2013    Procedure: CORONARY ARTERY BYPASS GRAFTING (CABG);  Surgeon: Grace Isaac, MD;  Location: [place];  Service: Open Heart Surgery;  Laterality: N/A;  . Wedge resection Right 06/04/2013    Procedure: WEDGE RESECTION Right Lower Lobe;  Surgeon: Grace Isaac, MD;  Location: [place];  Service: Open Heart Surgery;  Laterality: Right;   Current outpatient prescriptions:aspirin 81 MG tablet,  Take 81 mg by mouth daily., Disp: , Rfl: ;  atorvastatin (LIPITOR) 20 MG tablet, Take 1 tablet (20 mg total) by mouth daily at 6 PM., Disp: , Rfl: ;  ferrous fumarate (HEMOCYTE - 106 MG FE) 325 (106 FE) MG TABS tablet, Take 1 tablet (106 mg of iron total) by mouth 2 (two) times daily., Disp: 30 each, Rfl: 0 fluticasone (FLONASE) 50 MCG/ACT nasal spray, Place 2 sprays into the nose daily as needed for allergies. , Disp: , Rfl: ;  folic acid (FOLVITE) 1 MG tablet, Take 1 tablet (1 mg total) by mouth daily., Disp: , Rfl: ;  furosemide (LASIX) 40 MG tablet, Take 1 tablet (40 mg total) by mouth daily. y, Disp: 30 tablet,  Rfl: 5;  loratadine (CLARITIN) 10 MG tablet, Take 10 mg by mouth daily as needed for allergies. , Disp: , Rfl:  Multiple Vitamin (MULTIVITAMIN WITH MINERALS) TABS, Take 1 tablet by mouth daily., Disp: , Rfl: ;  omeprazole (PRILOSEC) 20 MG capsule, Take 20 mg by mouth daily., Disp: , Rfl: ;  potassium chloride SA (K-DUR,KLOR-CON) 20 MEQ tablet, Take 1 tablet (20 mEq total) by mouth daily., Disp: 30 tablet, Rfl: 12;  silodosin (RAPAFLO) 4 MG CAPS capsule, Take 8 mg by mouth daily with breakfast. , Disp: , Rfl:  solifenacin (VESICARE) 5 MG tablet, Take 10 mg by mouth daily. , Disp: , Rfl: ;  traMADol (ULTRAM) 50 MG tablet, Take 1 tablet (50 mg total) by mouth every 6 (six) hours as needed., Disp: 120 tablet, Rfl: 5    Allergies:   No Known Allergies  Social History:  The patient  reports that he quit smoking about 46 years ago. His smoking use included Cigarettes. He has a 30 pack-year smoking history. He has never used smokeless tobacco. He reports that he drinks alcohol. He reports that he does not use illicit drugs.   ROS:  Please see the history of present illness.   Denies any chest pain, syncope, fevers, orthopnea, PND.   All other systems reviewed and negative. Occasional flank pain/musculoskeletal discomfort.  PHYSICAL EXAM: VS:  BP 104/72  Pulse 61  Ht 5\' 8"  (1.727 m)  Wt 153 lb (69.4 kg)  BMI 23.27 kg/m2 Well nourished, well developed, in no acute distressElderly HEENT: normal Neck: no JVD Cardiac:  normal S1, S2; RRR; soft systolic murmur Lungs:  clear to auscultation bilaterally, no wheezing, rhonchi or rales Abd: soft, nontender, no hepatomegaly Ext: no edema bruising. Skin: warm and dry Neuro: no focal abnormalities noted, anxious  Echocardiogram 06/21/13-normal functioning bioprosthetic valve. Normal EF.  EKG: 06/10/14-sinus rhythm, nonspecific ST-T wave change, first degree AV block, 338 ms. No longer right bundle branch block     ASSESSMENT AND PLAN:  1. Atypical chest  pain-likely gastrointestinal. Overall doing well today. Results. Could be musculoskeletal as well. Doubt cardiac. 2. Aortic stenosis/bioprosthetic aortic valve replacement-long rehabilitation efforts at [facility]. Will decrease Lasix to 40 mg once a day. Overall strength has improved. He still does not feel back to normal. Encouraged continued walking. He walks approximately 3/4 of a mile with his wife. He could consider [employer]. Pleased with his progress. 3. Diastolic heart failure-currently well compensated. Continue with Lasix for now but will decrease dosage, 40 mg. Had multiple questions asked and answered. We will check basic metabolic profile and CBC 4. Aspirin 81 mg a day. Prior ecchymosis on arms. 5. CAD-bypass times one. ASA. Easy bruising. 6. We will see him back in 6 months.  Signed,  Candee Furbish, MD [facility]  06/10/2014 2:10 PM

## 2014-06-10 NOTE — Telephone Encounter (Signed)
Have him come in today at 2pm.  Agree with prilosec.   Candee Furbish, MD

## 2014-06-10 NOTE — Patient Instructions (Signed)
The current medical regimen is effective;  continue present plan and medications.  Follow up in 6 months with Dr. Skains.  You will receive a letter in the mail 2 months before you are due.  Please call us when you receive this letter to schedule your follow up appointment.  

## 2014-06-10 NOTE — Telephone Encounter (Signed)
PT AWARE AND WILL BE HERE AT 2 PM.

## 2014-07-12 ENCOUNTER — Ambulatory Visit: Payer: Medicare Other | Admitting: Cardiology

## 2014-08-04 ENCOUNTER — Telehealth: Payer: Self-pay | Admitting: Cardiology

## 2014-08-04 NOTE — Telephone Encounter (Signed)
New message     Pt says he does not feel good and want to talk to a nurse.  Denies sob or cp.  Please call

## 2014-08-04 NOTE — Telephone Encounter (Signed)
Patient has not been feeling good for a few days. Has been lightheaded, sob, and generally feels weak. Given appointment with Truitt Merle tomorrow at 8;00 am.

## 2014-08-05 ENCOUNTER — Encounter: Payer: Self-pay | Admitting: Nurse Practitioner

## 2014-08-05 ENCOUNTER — Ambulatory Visit (INDEPENDENT_AMBULATORY_CARE_PROVIDER_SITE_OTHER): Payer: Medicare Other | Admitting: Nurse Practitioner

## 2014-08-05 ENCOUNTER — Other Ambulatory Visit: Payer: Self-pay | Admitting: *Deleted

## 2014-08-05 VITALS — BP 104/60 | HR 63 | Ht 68.0 in | Wt 157.0 lb

## 2014-08-05 DIAGNOSIS — I10 Essential (primary) hypertension: Secondary | ICD-10-CM

## 2014-08-05 DIAGNOSIS — I4892 Unspecified atrial flutter: Secondary | ICD-10-CM | POA: Diagnosis not present

## 2014-08-05 DIAGNOSIS — I25810 Atherosclerosis of coronary artery bypass graft(s) without angina pectoris: Secondary | ICD-10-CM

## 2014-08-05 DIAGNOSIS — R531 Weakness: Secondary | ICD-10-CM | POA: Diagnosis not present

## 2014-08-05 DIAGNOSIS — R0600 Dyspnea, unspecified: Secondary | ICD-10-CM

## 2014-08-05 DIAGNOSIS — R5383 Other fatigue: Secondary | ICD-10-CM

## 2014-08-05 LAB — BASIC METABOLIC PANEL
BUN: 14 mg/dL (ref 6–23)
CO2: 27 mEq/L (ref 19–32)
Calcium: 8.7 mg/dL (ref 8.4–10.5)
Chloride: 98 mEq/L (ref 96–112)
Creatinine, Ser: 1.2 mg/dL (ref 0.4–1.5)
GFR: 63.71 mL/min (ref >60.00)
Glucose, Bld: 87 mg/dL (ref 70–99)
Potassium: 3.9 mEq/L (ref 3.5–5.1)
Sodium: 130 mEq/L — ABNORMAL LOW (ref 135–145)

## 2014-08-05 LAB — CBC
HCT: 35.5 % — ABNORMAL LOW (ref 39.0–52.0)
Hemoglobin: 11.8 g/dL — ABNORMAL LOW (ref 13.0–17.0)
MCHC: 33.3 g/dL (ref 30.0–36.0)
MCV: 101.4 fl — ABNORMAL HIGH (ref 78.0–100.0)
Platelets: 168 10*3/uL (ref 150.0–400.0)
RBC: 3.5 Mil/uL — ABNORMAL LOW (ref 4.22–5.81)
RDW: 14.1 % (ref 11.5–15.5)
WBC: 12.2 10*3/uL — ABNORMAL HIGH (ref 4.0–10.5)

## 2014-08-05 LAB — URINALYSIS
Bilirubin Urine: NEGATIVE
Hgb urine dipstick: NEGATIVE
Ketones, ur: NEGATIVE
Leukocytes, UA: NEGATIVE
Nitrite: NEGATIVE
Specific Gravity, Urine: 1.01 (ref 1.000–1.030)
Total Protein, Urine: NEGATIVE
Urine Glucose: NEGATIVE
Urobilinogen, UA: 0.2 (ref 0.0–1.0)
pH: 6.5 (ref 5.0–8.0)

## 2014-08-05 LAB — BRAIN NATRIURETIC PEPTIDE: Pro B Natriuretic peptide (BNP): 240 pg/mL — ABNORMAL HIGH (ref 0.0–100.0)

## 2014-08-05 LAB — TSH: TSH: 0.58 u[IU]/mL (ref 0.35–4.50)

## 2014-08-05 MED ORDER — FUROSEMIDE 40 MG PO TABS: 20.0000 mg | ORAL_TABLET | Freq: Every day | ORAL | Status: DC

## 2014-08-05 NOTE — Progress Notes (Signed)
Joseph Garner Date of Birth: 1924/06/24 Medical Record #[id number]  History of Present Illness: Mr. Mostafa is seen back today for a work in visit. Seen for Dr. Marlou Porch. He is an 80 year old male with prior AVR in 2014 for severe AS, diastolic HF, anemia, HTN, GERD, and chronic diastolic HF. He has CAD with a bypass x 1 to the diagonal branch.   Last seen in August by Dr. Marlou Porch - seemed to be doing ok.   Called yesterday - not feeling well - lightheaded and short of breath. Added to my schedule for today.  Comes in today. Here alone. Little hard to follow with his history. His wife was brought to the room as well - they both not that "he just does not feel good". Sounds like he was fine last week. On Wednesday, he "did not feel well". Maybe a little lightheaded. Short of breath. Yesterday felt better but wanted to "make sure everything is ok". No chest pain. Occasional swelling. BP in the 120's at home. No falls. Some runny nose noted. No fever. No dysuria that he is aware of.   Current Outpatient Prescriptions  Medication Sig Dispense Refill  . aspirin 81 MG tablet Take 81 mg by mouth daily.      Marland Kitchen atorvastatin (LIPITOR) 20 MG tablet Take 1 tablet (20 mg total) by mouth daily at 6 PM.      . ferrous fumarate (HEMOCYTE - 106 MG FE) 325 (106 FE) MG TABS tablet Take 1 tablet (106 mg of iron total) by mouth 2 (two) times daily.  30 each  0  . fluticasone (FLONASE) 50 MCG/ACT nasal spray Place 2 sprays into the nose daily as needed for allergies.       . folic acid (FOLVITE) 1 MG tablet Take 1 tablet (1 mg total) by mouth daily.      . furosemide (LASIX) 40 MG tablet Take 1 tablet (40 mg total) by mouth daily. y  30 tablet  5  . loratadine (CLARITIN) 10 MG tablet Take 10 mg by mouth daily as needed for allergies.       . Multiple Vitamin (MULTIVITAMIN WITH MINERALS) TABS Take 1 tablet by mouth daily.      Marland Kitchen omeprazole (PRILOSEC) 20 MG capsule Take 20 mg by mouth daily.      . potassium chloride  SA (K-DUR,KLOR-CON) 20 MEQ tablet Take 1 tablet (20 mEq total) by mouth daily.  30 tablet  12  . silodosin (RAPAFLO) 4 MG CAPS capsule Take 8 mg by mouth daily with breakfast.       . solifenacin (VESICARE) 5 MG tablet Take 10 mg by mouth daily.       . traMADol (ULTRAM) 50 MG tablet Take 1 tablet (50 mg total) by mouth every 6 (six) hours as needed.  120 tablet  5   No current facility-administered medications for this visit.    No Known Allergies  Past Medical History  Diagnosis Date  . BPH (benign prostatic hypertrophy)   . Diverticulosis 2006  . Depression   . Anemia   . Spinal stenosis   . Lung nodules 2010  . Actinic keratosis, hx of   . Macular degeneration   . Hypertension   . Allergy     allergic rhinitis  . Hearing loss     wears bilateral hearing aids  . Inguinal hernia     left side -surgery planned  . GERD (gastroesophageal reflux disease)   . Arthritis  generalized-more back issues  . Severe aortic stenosis 04/16/2013  . Nodule of right lung 05/24/2013  . CHF (congestive heart failure)   . Shortness of breath   . Atrial flutter   . Chronic diastolic heart failure 39/12/90    Past Surgical History  Procedure Laterality Date  . Tonsillectomy  1933  . Appendectomy  1947  . Neck surgery  1997    compressed nerve  . Skin biopsy  2003-2011    multiple   . Cataract extraction, bilateral      bilateral  . Inguinal hernia repair Left 02/11/2013    Procedure: LEFT LAPAROSCOPIC REPAIR INGUINAL HERNIA WITH MESH;  Surgeon: Adin Hector, MD;  Location: [facility];  Service: General;  Laterality: Left;  . Insertion of mesh Left 02/11/2013    Procedure: INSERTION OF MESH;  Surgeon: Adin Hector, MD;  Location: [facility];  Service: General;  Laterality: Left;  . Hernia repair  02/24/13    lap LIH repair  . Aortic valve replacement N/A 06/04/2013    Procedure: AORTIC VALVE REPLACEMENT (AVR);  Surgeon: Grace Isaac, MD;  Location: [facility];  Service: Open Heart  Surgery;  Laterality: N/A;  . Intraoperative transesophageal echocardiogram N/A 06/04/2013    Procedure: INTRAOPERATIVE TRANSESOPHAGEAL ECHOCARDIOGRAM;  Surgeon: Grace Isaac, MD;  Location: [facility];  Service: Open Heart Surgery;  Laterality: N/A;  . Coronary artery bypass graft N/A 06/04/2013    Procedure: CORONARY ARTERY BYPASS GRAFTING (CABG);  Surgeon: Grace Isaac, MD;  Location: [facility];  Service: Open Heart Surgery;  Laterality: N/A;  . Wedge resection Right 06/04/2013    Procedure: WEDGE RESECTION Right Lower Lobe;  Surgeon: Grace Isaac, MD;  Location: [facility];  Service: Open Heart Surgery;  Laterality: Right;    History  Smoking status  . Former Smoker -- 1.50 packs/day for 20 years  . Types: Cigarettes  . Quit date: 10/29/1967  Smokeless tobacco  . Never Used    History  Alcohol Use  . Yes    Comment: occasionally    Family History  Problem Relation Age of Onset  . Prostate cancer Father   . Cancer Mother     passed away age 7  . Cancer Father   . Cancer Brother   . Hypertension Brother     Review of Systems: The review of systems is per the HPI.  All other systems were reviewed and are negative.  Physical Exam: BP 104/60  Pulse 63  Ht 5\' 8"  (1.727 m)  Wt 157 lb (71.215 kg)  BMI 23.88 kg/m2  SpO2 97% Patient is an elderly and in no acute distress. Skin is warm and dry. Color is normal.  HEENT is unremarkable. Normocephalic/atraumatic. PERRL. Sclera are nonicteric. Neck is supple. No masses. No JVD. Lungs are clear. Cardiac exam shows a regular rate and rhythm. Abdomen is soft. Extremities are without edema. Gait and ROM are intact. No gross neurologic deficits noted.  Wt Readings from Last 3 Encounters:  08/05/14 157 lb (71.215 kg)  06/10/14 153 lb (69.4 kg)  01/10/14 163 lb (73.936 kg)    LABORATORY DATA/PROCEDURES:  Lab Results  Component Value Date   WBC 8.2 01/10/2014   HGB 10.3* 01/10/2014   HCT 30.8* 01/10/2014   PLT 162.0 01/10/2014    GLUCOSE 86 01/10/2014   CHOL 120 06/04/2013   TRIG 51 06/04/2013   HDL 50 06/04/2013   LDLCALC 60 06/04/2013   ALT 43 06/25/2013   AST 55* 06/25/2013  NA 134* 01/10/2014   K 4.5 01/10/2014   CL 100 01/10/2014   CREATININE 1.3 01/10/2014   BUN 24* 01/10/2014   CO2 28 01/10/2014   TSH 2.039 06/20/2013   INR 1.32 06/20/2013   HGBA1C 5.3 06/04/2013    BNP (last 3 results) No results found for this basename: PROBNP,  in the last 8760 hours   Echo Study Conclusions from August 2014  - Left ventricle: The cavity size was normal. There was mild concentric hypertrophy. Systolic function was vigorous. The estimated ejection fraction was in the range of 65% to 70%. Wall motion was normal; there were no regional wall motion abnormalities. Features are consistent with a pseudonormal left ventricular filling pattern, with concomitant abnormal relaxation and increased filling pressure (grade 2 diastolic dysfunction). - Aortic valve: A prosthesis was present and functioning normally. The prosthesis had a normal range of motion. The sewing ring appeared normal, had no rocking motion, and showed no evidence of dehiscence. Peak velocity: 258cm/s (S). Valve area: 1.86cm^2(VTI). Valve area: 1.57cm^2 (Vmax). - Pulmonary arteries: Systolic pressure was mildly increased. PA peak pressure: 80mm Hg (S). - Pericardium, extracardiac: There was a left pleural effusion.  06/03/2013  DATE OF DISCHARGE:  OPERATIVE REPORT  PREOPERATIVE DIAGNOSES: Critical aortic stenosis with acute and chronic  systolic heart failure and coronary occlusive disease and right lung  nodule.  POSTOPERATIVE DIAGNOSES: Critical aortic stenosis with acute and  chronic systolic heart failure and coronary occlusive disease and right  lung nodule.  SURGICAL PROCEDURE:  1. Aortic valve replacement with pericardial tissue valve, [place], model #3300 TFX 25 mm, serial [id number].  2. Coronary artery bypass grafting x1 with the  reverse saphenous vein  graft to the intermediate coronary artery with left thigh  endo vein harvesting  3. Wedge resection of right lung lesion.  SURGEON: Lanelle Bal, MD   Path:  Diagnosis  1. Lung, biopsy, Right lung  - FINDINGS CONSISTENT WITH BENIGN PULMONARY HAMARTOMA / CHONDROMA.  - NO EVIDENCE OF MALIGNANCY.  2. Heart valve leaflets, aortic  - BENIGN VALVULAR TISSUE WITH EMBEDDED CALCIFICATION AND SCATTERED MILD  CHRONIC INFLAMMATION.  - NO TUMOR SEEN.      Assessment / Plan: 1. Multitude of somatic complaints - vague - will check labs today.   2. CAD -  No active chest pain. EKG today shows sinus with 1st degree AV block and PVC  3. Prior AVR for AS with CABG x 1 and lung wedge resection - will update his echo. He is worried about the recent news articles about heart valves needing to be replaced.   4. HTN - BP low here - will cut his Lasix back to just 20 mg a day. Continue to monitor at home.   5. Advanced age  Further disposition to follow. Lab today. Echo to be updated.   Patient is agreeable to this plan and will call if any problems develop in the interim.   Burtis Junes, RN, [facility] [address] [place], [place]  [id number] [phone]

## 2014-08-05 NOTE — Patient Instructions (Addendum)
We will be checking the following labs today BMET, CBC, TSH, UA, BNP  Cut your Lasix back to just a half a pill - do not take any today - start the 1/2 pill tomorrow.  Keep a check on your blood pressure at home  We will get your echocardiogram updated  Call the [facility] office at [phone] if you have any questions, problems or concerns.

## 2014-08-09 ENCOUNTER — Ambulatory Visit (HOSPITAL_COMMUNITY): Payer: Medicare Other | Attending: Cardiology | Admitting: Cardiology

## 2014-08-09 ENCOUNTER — Other Ambulatory Visit (INDEPENDENT_AMBULATORY_CARE_PROVIDER_SITE_OTHER): Payer: Medicare Other | Admitting: *Deleted

## 2014-08-09 DIAGNOSIS — R531 Weakness: Secondary | ICD-10-CM | POA: Diagnosis not present

## 2014-08-09 DIAGNOSIS — E785 Hyperlipidemia, unspecified: Secondary | ICD-10-CM | POA: Diagnosis not present

## 2014-08-09 DIAGNOSIS — R5383 Other fatigue: Secondary | ICD-10-CM | POA: Diagnosis not present

## 2014-08-09 DIAGNOSIS — R0600 Dyspnea, unspecified: Secondary | ICD-10-CM | POA: Diagnosis not present

## 2014-08-09 DIAGNOSIS — I4892 Unspecified atrial flutter: Secondary | ICD-10-CM

## 2014-08-09 DIAGNOSIS — I5031 Acute diastolic (congestive) heart failure: Secondary | ICD-10-CM

## 2014-08-09 DIAGNOSIS — Z87891 Personal history of nicotine dependence: Secondary | ICD-10-CM | POA: Insufficient documentation

## 2014-08-09 DIAGNOSIS — I10 Essential (primary) hypertension: Secondary | ICD-10-CM | POA: Insufficient documentation

## 2014-08-09 DIAGNOSIS — Z953 Presence of xenogenic heart valve: Secondary | ICD-10-CM

## 2014-08-09 LAB — CBC WITH DIFFERENTIAL/PLATELET
Basophils Absolute: 0 10*3/uL (ref 0.0–0.1)
Basophils Relative: 0.6 % (ref 0.0–3.0)
Eosinophils Absolute: 0.3 10*3/uL (ref 0.0–0.7)
Eosinophils Relative: 4.8 % (ref 0.0–5.0)
HCT: 33.7 % — ABNORMAL LOW (ref 39.0–52.0)
Hemoglobin: 11.3 g/dL — ABNORMAL LOW (ref 13.0–17.0)
Lymphocytes Relative: 27.1 % (ref 12.0–46.0)
Lymphs Abs: 1.9 10*3/uL (ref 0.7–4.0)
MCHC: 33.4 g/dL (ref 30.0–36.0)
MCV: 100.1 fl — ABNORMAL HIGH (ref 78.0–100.0)
Monocytes Absolute: 0.9 10*3/uL (ref 0.1–1.0)
Monocytes Relative: 12.2 % — ABNORMAL HIGH (ref 3.0–12.0)
Neutro Abs: 3.9 10*3/uL (ref 1.4–7.7)
Neutrophils Relative %: 55.3 % (ref 43.0–77.0)
Platelets: 154 10*3/uL (ref 150.0–400.0)
RBC: 3.37 Mil/uL — ABNORMAL LOW (ref 4.22–5.81)
RDW: 14.1 % (ref 11.5–15.5)
WBC: 7.1 10*3/uL (ref 4.0–10.5)

## 2014-08-09 LAB — BASIC METABOLIC PANEL
BUN: 14 mg/dL (ref 6–23)
CO2: 26 mEq/L (ref 19–32)
Calcium: 8.6 mg/dL (ref 8.4–10.5)
Chloride: 100 mEq/L (ref 96–112)
Creatinine, Ser: 1.2 mg/dL (ref 0.4–1.5)
GFR: 58.95 mL/min — ABNORMAL LOW (ref >60.00)
Glucose, Bld: 77 mg/dL (ref 70–99)
Potassium: 4.5 mEq/L (ref 3.5–5.1)
Sodium: 132 mEq/L — ABNORMAL LOW (ref 135–145)

## 2014-08-09 NOTE — Progress Notes (Signed)
Echo performed. 

## 2014-08-12 ENCOUNTER — Telehealth: Payer: Self-pay | Admitting: Cardiology

## 2014-08-12 NOTE — Telephone Encounter (Signed)
Reviewed results of echo with pt who states understanding. 

## 2014-08-12 NOTE — Telephone Encounter (Signed)
New problem ° ° °Pt returning your call. °

## 2014-08-15 DIAGNOSIS — Z23 Encounter for immunization: Secondary | ICD-10-CM | POA: Diagnosis not present

## 2014-08-23 ENCOUNTER — Ambulatory Visit (INDEPENDENT_AMBULATORY_CARE_PROVIDER_SITE_OTHER): Payer: Medicare Other | Admitting: Urology

## 2014-08-23 ENCOUNTER — Ambulatory Visit: Payer: Self-pay | Admitting: Nurse Practitioner

## 2014-08-23 DIAGNOSIS — R351 Nocturia: Secondary | ICD-10-CM | POA: Diagnosis not present

## 2014-08-23 DIAGNOSIS — N401 Enlarged prostate with lower urinary tract symptoms: Secondary | ICD-10-CM | POA: Diagnosis not present

## 2014-09-06 DIAGNOSIS — Z1389 Encounter for screening for other disorder: Secondary | ICD-10-CM | POA: Diagnosis not present

## 2014-09-06 DIAGNOSIS — E780 Pure hypercholesterolemia: Secondary | ICD-10-CM | POA: Diagnosis not present

## 2014-09-06 DIAGNOSIS — Z23 Encounter for immunization: Secondary | ICD-10-CM | POA: Diagnosis not present

## 2014-09-06 DIAGNOSIS — I10 Essential (primary) hypertension: Secondary | ICD-10-CM | POA: Diagnosis not present

## 2014-09-06 DIAGNOSIS — Z0000 Encounter for general adult medical examination without abnormal findings: Secondary | ICD-10-CM | POA: Diagnosis not present

## 2014-09-06 DIAGNOSIS — J309 Allergic rhinitis, unspecified: Secondary | ICD-10-CM | POA: Diagnosis not present

## 2014-09-06 DIAGNOSIS — K589 Irritable bowel syndrome without diarrhea: Secondary | ICD-10-CM | POA: Diagnosis not present

## 2014-09-06 DIAGNOSIS — N182 Chronic kidney disease, stage 2 (mild): Secondary | ICD-10-CM | POA: Diagnosis not present

## 2014-09-06 DIAGNOSIS — K219 Gastro-esophageal reflux disease without esophagitis: Secondary | ICD-10-CM | POA: Diagnosis not present

## 2014-09-06 DIAGNOSIS — I471 Supraventricular tachycardia: Secondary | ICD-10-CM | POA: Diagnosis not present

## 2014-09-19 ENCOUNTER — Other Ambulatory Visit: Payer: Self-pay

## 2014-09-19 MED ORDER — FUROSEMIDE 40 MG PO TABS: 40.0000 mg | ORAL_TABLET | Freq: Every day | ORAL | Status: DC

## 2014-10-31 DIAGNOSIS — C44311 Basal cell carcinoma of skin of nose: Secondary | ICD-10-CM | POA: Diagnosis not present

## 2014-10-31 DIAGNOSIS — Z08 Encounter for follow-up examination after completed treatment for malignant neoplasm: Secondary | ICD-10-CM | POA: Diagnosis not present

## 2014-10-31 DIAGNOSIS — Z85828 Personal history of other malignant neoplasm of skin: Secondary | ICD-10-CM | POA: Diagnosis not present

## 2014-10-31 DIAGNOSIS — X32XXXA Exposure to sunlight, initial encounter: Secondary | ICD-10-CM | POA: Diagnosis not present

## 2014-10-31 DIAGNOSIS — L570 Actinic keratosis: Secondary | ICD-10-CM | POA: Diagnosis not present

## 2014-10-31 DIAGNOSIS — C44619 Basal cell carcinoma of skin of left upper limb, including shoulder: Secondary | ICD-10-CM | POA: Diagnosis not present

## 2014-11-04 DIAGNOSIS — H3531 Nonexudative age-related macular degeneration: Secondary | ICD-10-CM | POA: Diagnosis not present

## 2014-11-04 DIAGNOSIS — Z961 Presence of intraocular lens: Secondary | ICD-10-CM | POA: Diagnosis not present

## 2014-11-07 DIAGNOSIS — J069 Acute upper respiratory infection, unspecified: Secondary | ICD-10-CM | POA: Diagnosis not present

## 2014-12-01 DIAGNOSIS — Z08 Encounter for follow-up examination after completed treatment for malignant neoplasm: Secondary | ICD-10-CM | POA: Diagnosis not present

## 2014-12-01 DIAGNOSIS — Z85828 Personal history of other malignant neoplasm of skin: Secondary | ICD-10-CM | POA: Diagnosis not present

## 2014-12-05 DIAGNOSIS — K1230 Oral mucositis (ulcerative), unspecified: Secondary | ICD-10-CM | POA: Diagnosis not present

## 2014-12-05 DIAGNOSIS — D485 Neoplasm of uncertain behavior of skin: Secondary | ICD-10-CM | POA: Diagnosis not present

## 2014-12-15 ENCOUNTER — Ambulatory Visit: Payer: Medicare Other | Admitting: Cardiology

## 2014-12-19 ENCOUNTER — Ambulatory Visit: Payer: Medicare Other | Admitting: Cardiology

## 2015-01-27 ENCOUNTER — Encounter: Payer: Self-pay | Admitting: Cardiology

## 2015-01-27 ENCOUNTER — Ambulatory Visit (INDEPENDENT_AMBULATORY_CARE_PROVIDER_SITE_OTHER): Payer: Medicare Other | Admitting: Cardiology

## 2015-01-27 VITALS — BP 106/62 | HR 60 | Ht 68.0 in | Wt 156.0 lb

## 2015-01-27 DIAGNOSIS — Z952 Presence of prosthetic heart valve: Secondary | ICD-10-CM

## 2015-01-27 DIAGNOSIS — Z951 Presence of aortocoronary bypass graft: Secondary | ICD-10-CM

## 2015-01-27 DIAGNOSIS — Z954 Presence of other heart-valve replacement: Secondary | ICD-10-CM

## 2015-01-27 DIAGNOSIS — I5032 Chronic diastolic (congestive) heart failure: Secondary | ICD-10-CM | POA: Diagnosis not present

## 2015-01-27 MED ORDER — FUROSEMIDE 40 MG PO TABS: 40.0000 mg | ORAL_TABLET | ORAL | Status: DC | PRN

## 2015-01-27 NOTE — Progress Notes (Signed)
[facility]. [address], [place]  [address] Phone: [phone] Fax:  [phone]  Date:  01/27/2015   ID:  Joseph Garner, DOB 03-06-1924, MRN [id number]  PCP:  Wenda Low, MD   History of Present Illness: Joseph Garner is a 81 y.o. male with bioprosthetic aortic valve replacement 06/04/13, with postoperative readmission to hospital secondary to acute diastolic heart failure, pneumonitis with cessation of amiodarone here for followup. He had prolonged stay at [facility] rehabilitation.  Terrible allergies. Tire easy. Still not having strength that he wishes he had.  Hed bypass x1, diagonal branch.  Mild SOB. Compression hose. No significant chest pain. He does not feel as though he is back to 100 percent.  Previously Hard for him to stay in [place], gets dizzy. Bright lights, floor bright. Had back/ flank pain spasm. Saw Dr. Teresa Pelton. Took tylenol and better.   Blood pressure is a little bit on the low side. Periodically at home he may check it and he will be in normal range.  He has been battling upper respiratory allergies.    Wt Readings from Last 3 Encounters:  01/27/15 156 lb (70.761 kg)  08/05/14 157 lb (71.215 kg)  06/10/14 153 lb (69.4 kg)     Past Medical History  Diagnosis Date  . BPH (benign prostatic hypertrophy)   . Diverticulosis 2006  . Depression   . Anemia   . Spinal stenosis   . Lung nodules 2010  . Actinic keratosis, hx of   . Macular degeneration   . Hypertension   . Allergy     allergic rhinitis  . Hearing loss     wears bilateral hearing aids  . Inguinal hernia     left side -surgery planned  . GERD (gastroesophageal reflux disease)   . Arthritis     generalized-more back issues  . Severe aortic stenosis 04/16/2013  . Nodule of right lung 05/24/2013  . CHF (congestive heart failure)   . Shortness of breath   . Atrial flutter   . Chronic diastolic heart failure 03/00/9233    Past Surgical History    Procedure Laterality Date  . Tonsillectomy  1933  . Appendectomy  1947  . Neck surgery  1997    compressed nerve  . Skin biopsy  2003-2011    multiple   . Cataract extraction, bilateral      bilateral  . Inguinal hernia repair Left 02/11/2013    Procedure: LEFT LAPAROSCOPIC REPAIR INGUINAL HERNIA WITH MESH;  Surgeon: Adin Hector, MD;  Location[facility];  Service: General;  Laterality: Left;  . Insertion of mesh Left 02/11/2013    Procedure: INSERTION OF MESH;  Surgeon: Adin Hector, MD;  [facility];  Service: General;  Laterality: Left;  . Hernia repair  02/24/13    lap LIH repair  . Aortic valve replacement N/A 06/04/2013    Procedure: AORTIC VALVE REPLACEMENT (AVR);  Surgeon: Grace Isaac, MD;  Location: [facility];  Service: Open Heart Surgery;  Laterality: N/A;  . Intraoperative transesophageal echocardiogram N/A 06/04/2013    Procedure: INTRAOPERATIVE TRANSESOPHAGEAL ECHOCARDIOGRAM;  Surgeon: Grace Isaac, MD;  Location: [facility];  Service: Open Heart Surgery;  Laterality: N/A;  . Coronary artery bypass graft N/A 06/04/2013    Procedure: CORONARY ARTERY BYPASS GRAFTING (CABG);  Surgeon: Grace Isaac, MD;  Location: [facility];  Service: Open Heart Surgery;  Laterality: N/A;  . Wedge resection Right 06/04/2013  Procedure: WEDGE RESECTION Right Lower Lobe;  Surgeon: Grace Isaac, MD;  Location: [facility];  Service: Open Heart Surgery;  Laterality: Right;    Current outpatient prescriptions:  .  aspirin 81 MG tablet, Take 81 mg by mouth daily., Disp: , Rfl:  .  atorvastatin (LIPITOR) 20 MG tablet, Take 1 tablet (20 mg total) by mouth daily at 6 PM., Disp: , Rfl:  .  ferrous fumarate (HEMOCYTE - 106 MG FE) 325 (106 FE) MG TABS tablet, Take 1 tablet (106 mg of iron total) by mouth 2 (two) times daily., Disp: 30 each, Rfl: 0 .  fluticasone (FLONASE) 50 MCG/ACT nasal spray, Place 2 sprays into the nose daily as needed for allergies. , Disp: , Rfl:  .  folic acid (FOLVITE) 1 MG  tablet, Take 1 tablet (1 mg total) by mouth daily., Disp: , Rfl:  .  furosemide (LASIX) 40 MG tablet, Take 1 tablet (40 mg total) by mouth daily. (Patient taking differently: Take 20 mg by mouth daily. Take 1/2 tablet  Once a day), Disp: 30 tablet, Rfl: 5 .  loratadine (CLARITIN) 10 MG tablet, Take 10 mg by mouth daily as needed for allergies. , Disp: , Rfl:  .  meloxicam (MOBIC) 15 MG tablet, Take 15 mg by mouth as needed for pain., Disp: , Rfl:  .  Multiple Vitamin (MULTIVITAMIN WITH MINERALS) TABS, Take 1 tablet by mouth daily., Disp: , Rfl:  .  omeprazole (PRILOSEC) 20 MG capsule, Take 20 mg by mouth daily., Disp: , Rfl:  .  oxybutynin (DITROPAN-XL) 10 MG 24 hr tablet, Take 10 mg by mouth at bedtime., Disp: , Rfl:  .  potassium chloride SA (K-DUR,KLOR-CON) 20 MEQ tablet, Take 1 tablet (20 mEq total) by mouth daily., Disp: 30 tablet, Rfl: 12 .  tamsulosin (FLOMAX) 0.4 MG CAPS capsule, Take 0.4 mg by mouth., Disp: , Rfl:     Allergies:   No Known Allergies  Social History:  The patient  reports that he quit smoking about 47 years ago. His smoking use included Cigarettes. He has a 30 pack-year smoking history. He has never used smokeless tobacco. He reports that he drinks alcohol. He reports that he does not use illicit drugs.   ROS:  Please see the history of present illness.   Denies any chest pain, syncope, fevers, orthopnea, PND.   All other systems reviewed and negative. Occasional flank pain/musculoskeletal discomfort.  PHYSICAL EXAM: VS:  BP 106/62 mmHg  Pulse 60  Ht 5\' 8"  (1.727 m)  Wt 156 lb (70.761 kg)  BMI 23.73 kg/m2 Well nourished, well developed, in no acute distressElderly HEENT: normal Neck: no JVD Cardiac:  normal S1, S2; RRR; soft systolic murmur Lungs:  clear to auscultation bilaterally, no wheezing, rhonchi or rales Abd: soft, nontender, no hepatomegaly Ext: no edema bruising. Skin: warm and dry Neuro: no focal abnormalities noted, anxious  Echocardiogram  06/21/13-normal functioning bioprosthetic valve. Normal EF.  EKG: 06/10/14-sinus rhythm, nonspecific ST-T wave change, first degree AV block, 338 ms. No longer right bundle branch block     ASSESSMENT AND PLAN:   1. Aortic stenosis/bioprosthetic aortic valve replacement-long rehabilitation efforts at [facility].  Overall strength has improved. He still does not feel back to normal somewhat weakened even though it is been quite some time. Encouraged continued walking.  He could consider [employer]. Pleased with his progress. 2. Diastolic heart failure-currently well compensated. Weighted atria noted on echocardiogram with normal ejection fraction. His blood pressure seems quite low today,  with associated weakness I will actually stop his 20 mg of Lasix that he has been taking. He will call me if he notes that his weight is increasing or shortness of breath is worsening.. Had multiple questions asked and answered.  3. Aspirin 81 mg a day. Prior ecchymosis on arms. 4. CAD-bypass times one. ASA. Easy bruising. Lipitor. 5. We will see him back in 6 months.  Signed, Candee Furbish, MD [facility]  01/27/2015 10:16 AM

## 2015-01-27 NOTE — Patient Instructions (Signed)
Please take Lasix on a as needed basis only. Continue all other medications as listed.  Follow up in 6 months with Dr. Marlou Porch.  You will receive a letter in the mail 2 months before you are due.  Please call us when you receive this letter to schedule your follow up appointment.  Thank you for choosing [facility]!!

## 2015-03-02 DIAGNOSIS — L570 Actinic keratosis: Secondary | ICD-10-CM | POA: Diagnosis not present

## 2015-03-02 DIAGNOSIS — C44310 Basal cell carcinoma of skin of unspecified parts of face: Secondary | ICD-10-CM | POA: Diagnosis not present

## 2015-03-02 DIAGNOSIS — Z85828 Personal history of other malignant neoplasm of skin: Secondary | ICD-10-CM | POA: Diagnosis not present

## 2015-03-02 DIAGNOSIS — Z08 Encounter for follow-up examination after completed treatment for malignant neoplasm: Secondary | ICD-10-CM | POA: Diagnosis not present

## 2015-03-02 DIAGNOSIS — C44319 Basal cell carcinoma of skin of other parts of face: Secondary | ICD-10-CM | POA: Diagnosis not present

## 2015-03-02 DIAGNOSIS — D225 Melanocytic nevi of trunk: Secondary | ICD-10-CM | POA: Diagnosis not present

## 2015-03-02 DIAGNOSIS — X32XXXD Exposure to sunlight, subsequent encounter: Secondary | ICD-10-CM | POA: Diagnosis not present

## 2015-03-02 DIAGNOSIS — L820 Inflamed seborrheic keratosis: Secondary | ICD-10-CM | POA: Diagnosis not present

## 2015-03-09 DIAGNOSIS — I25810 Atherosclerosis of coronary artery bypass graft(s) without angina pectoris: Secondary | ICD-10-CM | POA: Diagnosis not present

## 2015-03-09 DIAGNOSIS — N183 Chronic kidney disease, stage 3 (moderate): Secondary | ICD-10-CM | POA: Diagnosis not present

## 2015-03-09 DIAGNOSIS — Z952 Presence of prosthetic heart valve: Secondary | ICD-10-CM | POA: Diagnosis not present

## 2015-03-09 DIAGNOSIS — K219 Gastro-esophageal reflux disease without esophagitis: Secondary | ICD-10-CM | POA: Diagnosis not present

## 2015-03-09 DIAGNOSIS — E780 Pure hypercholesterolemia: Secondary | ICD-10-CM | POA: Diagnosis not present

## 2015-03-09 DIAGNOSIS — D649 Anemia, unspecified: Secondary | ICD-10-CM | POA: Diagnosis not present

## 2015-03-09 DIAGNOSIS — K4090 Unilateral inguinal hernia, without obstruction or gangrene, not specified as recurrent: Secondary | ICD-10-CM | POA: Diagnosis not present

## 2015-03-09 DIAGNOSIS — I10 Essential (primary) hypertension: Secondary | ICD-10-CM | POA: Diagnosis not present

## 2015-04-05 DIAGNOSIS — H5310 Unspecified subjective visual disturbances: Secondary | ICD-10-CM | POA: Diagnosis not present

## 2015-04-10 DIAGNOSIS — I10 Essential (primary) hypertension: Secondary | ICD-10-CM | POA: Diagnosis not present

## 2015-04-12 DIAGNOSIS — H5310 Unspecified subjective visual disturbances: Secondary | ICD-10-CM | POA: Diagnosis not present

## 2015-05-08 DIAGNOSIS — J309 Allergic rhinitis, unspecified: Secondary | ICD-10-CM | POA: Diagnosis not present

## 2015-05-08 DIAGNOSIS — I10 Essential (primary) hypertension: Secondary | ICD-10-CM | POA: Diagnosis not present

## 2015-06-08 DIAGNOSIS — H3531 Nonexudative age-related macular degeneration: Secondary | ICD-10-CM | POA: Diagnosis not present

## 2015-06-08 DIAGNOSIS — H04123 Dry eye syndrome of bilateral lacrimal glands: Secondary | ICD-10-CM | POA: Diagnosis not present

## 2015-06-12 DIAGNOSIS — Z85828 Personal history of other malignant neoplasm of skin: Secondary | ICD-10-CM | POA: Diagnosis not present

## 2015-06-12 DIAGNOSIS — C44722 Squamous cell carcinoma of skin of right lower limb, including hip: Secondary | ICD-10-CM | POA: Diagnosis not present

## 2015-06-12 DIAGNOSIS — Z08 Encounter for follow-up examination after completed treatment for malignant neoplasm: Secondary | ICD-10-CM | POA: Diagnosis not present

## 2015-06-20 ENCOUNTER — Encounter: Payer: Self-pay | Admitting: Cardiology

## 2015-06-20 ENCOUNTER — Telehealth: Payer: Self-pay | Admitting: Cardiology

## 2015-06-20 NOTE — Telephone Encounter (Signed)
Pt c/o of Chest Pain: 1. Are you having CP right now? No  2. Are you experiencing any other symptoms (ex. SOB, nausea, vomiting, sweating)? No  3. How long have you been experiencing CP? Breiefly Sunday afternoon  4. Is your CP continuous or coming and going? Coming and going  5. Have you taken Nitroglycerin? No   Comments: Pt called initially requesting a sooner appt with Dr. Marlou Porch. Pt then stated that he has had chest pain on the left side of his heart briefly on Sunday. Request a call back to discuss.

## 2015-06-20 NOTE — Telephone Encounter (Signed)
Per pt - had an episode on Sunday that lasted apprioximately 5 seconds "if that long" where he had a cramping feeling on the left chest of his chest.  Occurred X 3.  Denies any radiation or n/v.  Has SOB but is SOB all the time.  He felt like it took his breath away.  No s/s at this time.  Pt is aware to call back or report to the ED if s/s reoccur.  He is scheduled for evaluation on Friday with Dr Marlou Porch.

## 2015-06-23 ENCOUNTER — Ambulatory Visit (INDEPENDENT_AMBULATORY_CARE_PROVIDER_SITE_OTHER): Payer: Medicare Other | Admitting: Cardiology

## 2015-06-23 ENCOUNTER — Encounter: Payer: Self-pay | Admitting: Cardiology

## 2015-06-23 VITALS — BP 136/70 | HR 66 | Ht 66.0 in | Wt 153.2 lb

## 2015-06-23 DIAGNOSIS — I5032 Chronic diastolic (congestive) heart failure: Secondary | ICD-10-CM | POA: Diagnosis not present

## 2015-06-23 DIAGNOSIS — I440 Atrioventricular block, first degree: Secondary | ICD-10-CM

## 2015-06-23 DIAGNOSIS — Z954 Presence of other heart-valve replacement: Secondary | ICD-10-CM | POA: Diagnosis not present

## 2015-06-23 DIAGNOSIS — Z951 Presence of aortocoronary bypass graft: Secondary | ICD-10-CM | POA: Diagnosis not present

## 2015-06-23 DIAGNOSIS — Z952 Presence of prosthetic heart valve: Secondary | ICD-10-CM

## 2015-06-23 NOTE — Patient Instructions (Signed)
Medication Instructions:  Your physician recommends that you continue on your current medications as directed. Please refer to the Current Medication list given to you today.  Follow-Up: Follow up in 6 months with Dr. Skains.  You will receive a letter in the mail 2 months before you are due.  Please call us when you receive this letter to schedule your follow up appointment.  Thank you for choosing [facility]!!     

## 2015-06-23 NOTE — Progress Notes (Signed)
[place]. [address], [address], [place]  [address] Phone: [phone] Fax:  [phone]  Date:  06/23/2015   ID:  Joseph Garner, DOB 11-21-1923, MRN [id number]  PCP:  Wenda Low, MD   History of Present Illness: Joseph Garner is a 81 y.o. male with bioprosthetic aortic valve replacement 06/04/13, with postoperative readmission to hospital secondary to acute diastolic heart failure, pneumonitis with cessation of amiodarone here for followup. He had prolonged stay at [facility] rehabilitation.  Tire easy. Still not having strength that he wishes he had.  Had bypass x1, diagonal branch.   first-degree heart block noted.   Had issues with fleeting severe chest discomfort, lasted 1 second duration, electrical. Likely musculoskeletal.   Wt Readings from Last 3 Encounters:  06/23/15 153 lb 4 oz (69.514 kg)  01/27/15 156 lb (70.761 kg)  08/05/14 157 lb (71.215 kg)     Past Medical History  Diagnosis Date  . BPH (benign prostatic hypertrophy)   . Diverticulosis 2006  . Depression   . Anemia   . Spinal stenosis   . Lung nodules 2010  . Actinic keratosis, hx of   . Macular degeneration   . Hypertension   . Allergy     allergic rhinitis  . Hearing loss     wears bilateral hearing aids  . Inguinal hernia     left side -surgery planned  . GERD (gastroesophageal reflux disease)   . Arthritis     generalized-more back issues  . Severe aortic stenosis 04/16/2013  . Nodule of right lung 05/24/2013  . CHF (congestive heart failure)   . Shortness of breath   . Atrial flutter   . Chronic diastolic heart failure 66/29/4765    Past Surgical History  Procedure Laterality Date  . Tonsillectomy  1933  . Appendectomy  1947  . Neck surgery  1997    compressed nerve  . Skin biopsy  2003-2011    multiple   . Cataract extraction, bilateral      bilateral  . Inguinal hernia repair Left 02/11/2013    Procedure: LEFT LAPAROSCOPIC REPAIR INGUINAL HERNIA WITH MESH;   Surgeon: Adin Hector, MD;  Location: [facility];  Service: General;  Laterality: Left;  . Insertion of mesh Left 02/11/2013    Procedure: INSERTION OF MESH;  Surgeon: Adin Hector, MD;  Location: [facility];  Service: General;  Laterality: Left;  . Hernia repair  02/24/13    lap LIH repair  . Aortic valve replacement N/A 06/04/2013    Procedure: AORTIC VALVE REPLACEMENT (AVR);  Surgeon: Grace Isaac, MD;  Location: [place];  Service: Open Heart Surgery;  Laterality: N/A;  . Intraoperative transesophageal echocardiogram N/A 06/04/2013    Procedure: INTRAOPERATIVE TRANSESOPHAGEAL ECHOCARDIOGRAM;  Surgeon: Grace Isaac, MD;  Location: [place];  Service: Open Heart Surgery;  Laterality: N/A;  . Coronary artery bypass graft N/A 06/04/2013    Procedure: CORONARY ARTERY BYPASS GRAFTING (CABG);  Surgeon: Grace Isaac, MD;  Location: [place];  Service: Open Heart Surgery;  Laterality: N/A;  . Wedge resection Right 06/04/2013    Procedure: WEDGE RESECTION Right Lower Lobe;  Surgeon: Grace Isaac, MD;  Location: [place];  Service: Open Heart Surgery;  Laterality: Right;    Current outpatient prescriptions:  .  amoxicillin (AMOXIL) 500 MG tablet, Take 500 mg by mouth as needed (Take 4 before dental appt)., Disp: , Rfl:  .  aspirin 81 MG tablet, Take 81  mg by mouth daily., Disp: , Rfl:  .  atorvastatin (LIPITOR) 20 MG tablet, Take 1 tablet (20 mg total) by mouth daily at 6 PM., Disp: , Rfl:  .  ferrous fumarate (HEMOCYTE - 106 MG FE) 325 (106 FE) MG TABS tablet, Take 1 tablet (106 mg of iron total) by mouth 2 (two) times daily., Disp: 30 each, Rfl: 0 .  fluticasone (FLONASE) 50 MCG/ACT nasal spray, Place 2 sprays into the nose daily as needed for allergies. , Disp: , Rfl:  .  folic acid (FOLVITE) 1 MG tablet, Take 1 tablet (1 mg total) by mouth daily., Disp: , Rfl:  .  furosemide (LASIX) 40 MG tablet, Take 40 mg by mouth as needed (Patient takes 1/2 pill)., Disp: , Rfl:  .  lisinopril (PRINIVIL,ZESTRIL)  2.5 MG tablet, Take 2.5 mg by mouth daily., Disp: , Rfl:  .  loratadine (CLARITIN) 10 MG tablet, Take 10 mg by mouth daily as needed for allergies. , Disp: , Rfl:  .  meloxicam (MOBIC) 15 MG tablet, Take 15 mg by mouth as needed for pain., Disp: , Rfl:  .  Multiple Vitamin (MULTIVITAMIN WITH MINERALS) TABS, Take 1 tablet by mouth daily., Disp: , Rfl:  .  Multiple Vitamins-Minerals (EYE VITAMINS PO), Take 1 tablet by mouth daily. Perservision Vitamin, Disp: , Rfl:  .  omeprazole (PRILOSEC) 20 MG capsule, Take 20 mg by mouth daily., Disp: , Rfl:  .  oxybutynin (DITROPAN-XL) 10 MG 24 hr tablet, Take 10 mg by mouth at bedtime., Disp: , Rfl:  .  potassium chloride SA (K-DUR,KLOR-CON) 20 MEQ tablet, Take 1 tablet (20 mEq total) by mouth daily., Disp: 30 tablet, Rfl: 12 .  tamsulosin (FLOMAX) 0.4 MG CAPS capsule, Take 0.4 mg by mouth., Disp: , Rfl:     Allergies:   No Known Allergies  Social History:  The patient  reports that he quit smoking about 47 years ago. His smoking use included Cigarettes. He has a 30 pack-year smoking history. He has never used smokeless tobacco. He reports that he drinks alcohol. He reports that he does not use illicit drugs.   ROS:  Please see the history of present illness.   Denies any chest pain, syncope, fevers, orthopnea, PND.   All other systems reviewed and negative. Occasional flank pain/musculoskeletal discomfort.  PHYSICAL EXAM: VS:  BP 136/70 mmHg  Pulse 66  Ht 5\' 6"  (1.676 m)  Wt 153 lb 4 oz (69.514 kg)  BMI 24.75 kg/m2 Well nourished, well developed, in no acute distressElderly HEENT: normal Neck: no JVD Cardiac:  normal S1, S2; RRR; soft systolic murmur Lungs:  clear to auscultation bilaterally, no wheezing, rhonchi or rales Abd: soft, nontender, no hepatomegaly Ext: no edema bruising. Skin: warm and dry Neuro: no focal abnormalities noted, anxious  Echocardiogram 06/21/13-normal functioning bioprosthetic valve. Normal EF.  EKG:  Today  -06/23/15-sinus rhythm, first-degree AV block, 424 ms. 06/10/14-sinus rhythm, nonspecific ST-T wave change, first degree AV block, 338 ms. No longer right bundle branch block     ASSESSMENT AND PLAN:   1. Aortic stenosis/bioprosthetic aortic valve replacement-long rehabilitation efforts at [facility].   Recent echocardiogram reviewed. Valve is functioning well. 2. Diastolic heart failure-currently well compensated.  Dilated atria noted on echocardiogram with normal ejection fraction. Had multiple questions asked and answered.  3. Aspirin 81 mg a day. Prior ecchymosis on arms. 4. CAD-bypass times one. ASA. Easy bruising. Lipitor. 5.  atypical chest pain- fleeting, short-lived, left-sided, likely musculoskeletal, cramping. EKG reassuring. 6.  First-degree AV block- 424 ms noted on EKG. No symptoms, no syncope. If heart rate decreases more significantly, he may require pacemaker. We discussed. 7. We Joseph see him back in 6 months.  Signed, Candee Furbish, MD [facility]  06/23/2015 11:28 AM

## 2015-07-10 DIAGNOSIS — H04123 Dry eye syndrome of bilateral lacrimal glands: Secondary | ICD-10-CM | POA: Diagnosis not present

## 2015-07-11 DIAGNOSIS — R05 Cough: Secondary | ICD-10-CM | POA: Diagnosis not present

## 2015-07-11 DIAGNOSIS — J309 Allergic rhinitis, unspecified: Secondary | ICD-10-CM | POA: Diagnosis not present

## 2015-07-20 DIAGNOSIS — Z08 Encounter for follow-up examination after completed treatment for malignant neoplasm: Secondary | ICD-10-CM | POA: Diagnosis not present

## 2015-07-20 DIAGNOSIS — D485 Neoplasm of uncertain behavior of skin: Secondary | ICD-10-CM | POA: Diagnosis not present

## 2015-07-20 DIAGNOSIS — Z85828 Personal history of other malignant neoplasm of skin: Secondary | ICD-10-CM | POA: Diagnosis not present

## 2015-07-21 DIAGNOSIS — Z23 Encounter for immunization: Secondary | ICD-10-CM | POA: Diagnosis not present

## 2015-07-27 ENCOUNTER — Ambulatory Visit: Payer: Medicare Other | Admitting: Cardiology

## 2015-07-27 DIAGNOSIS — X32XXXD Exposure to sunlight, subsequent encounter: Secondary | ICD-10-CM | POA: Diagnosis not present

## 2015-07-27 DIAGNOSIS — L570 Actinic keratosis: Secondary | ICD-10-CM | POA: Diagnosis not present

## 2015-08-22 ENCOUNTER — Ambulatory Visit (INDEPENDENT_AMBULATORY_CARE_PROVIDER_SITE_OTHER): Payer: Medicare Other | Admitting: Urology

## 2015-08-22 DIAGNOSIS — N401 Enlarged prostate with lower urinary tract symptoms: Secondary | ICD-10-CM | POA: Diagnosis not present

## 2015-08-22 DIAGNOSIS — R350 Frequency of micturition: Secondary | ICD-10-CM

## 2015-08-24 DIAGNOSIS — J309 Allergic rhinitis, unspecified: Secondary | ICD-10-CM | POA: Diagnosis not present

## 2015-08-24 DIAGNOSIS — R05 Cough: Secondary | ICD-10-CM | POA: Diagnosis not present

## 2015-09-08 ENCOUNTER — Encounter: Payer: Self-pay | Admitting: Cardiology

## 2015-09-08 ENCOUNTER — Ambulatory Visit (INDEPENDENT_AMBULATORY_CARE_PROVIDER_SITE_OTHER): Payer: Medicare Other | Admitting: Cardiology

## 2015-09-08 VITALS — BP 98/50 | HR 48 | Ht 68.0 in | Wt 155.8 lb

## 2015-09-08 DIAGNOSIS — I498 Other specified cardiac arrhythmias: Secondary | ICD-10-CM | POA: Diagnosis not present

## 2015-09-08 DIAGNOSIS — I509 Heart failure, unspecified: Secondary | ICD-10-CM | POA: Diagnosis not present

## 2015-09-08 DIAGNOSIS — I5031 Acute diastolic (congestive) heart failure: Secondary | ICD-10-CM | POA: Diagnosis not present

## 2015-09-08 DIAGNOSIS — R001 Bradycardia, unspecified: Secondary | ICD-10-CM

## 2015-09-08 DIAGNOSIS — I4892 Unspecified atrial flutter: Secondary | ICD-10-CM | POA: Diagnosis not present

## 2015-09-08 LAB — CBC
HCT: 36 % — ABNORMAL LOW (ref 39.0–52.0)
HEMOGLOBIN: 12.5 g/dL — AB (ref 13.0–17.0)
MCH: 33.9 pg (ref 26.0–34.0)
MCHC: 34.7 g/dL (ref 30.0–36.0)
MCV: 97.6 fL (ref 78.0–100.0)
MPV: 9.6 fL (ref 8.6–12.4)
PLATELETS: 196 10*3/uL (ref 150–400)
RBC: 3.69 MIL/uL — AB (ref 4.22–5.81)
RDW: 13.9 % (ref 11.5–15.5)
WBC: 8.4 10*3/uL (ref 4.0–10.5)

## 2015-09-08 LAB — BASIC METABOLIC PANEL
BUN: 18 mg/dL (ref 7–25)
CALCIUM: 8.8 mg/dL (ref 8.6–10.3)
CO2: 24 mmol/L (ref 20–31)
Chloride: 97 mmol/L — ABNORMAL LOW (ref 98–110)
Creat: 1.16 mg/dL — ABNORMAL HIGH (ref 0.70–1.11)
Glucose, Bld: 90 mg/dL (ref 65–99)
Potassium: 4.9 mmol/L (ref 3.5–5.3)
SODIUM: 131 mmol/L — AB (ref 135–146)

## 2015-09-08 LAB — MAGNESIUM: MAGNESIUM: 2.2 mg/dL (ref 1.5–2.5)

## 2015-09-08 NOTE — Patient Instructions (Addendum)
Medication Instructions:   Your physician recommends that you continue on your current medications as directed. Please refer to the Current Medication list given to you today.  If you need a refill on your cardiac medications before your next appointment, please call your pharmacy.  Labwork: TSH BMP MAG AND CBC TODAY    Testing/Procedures:  Your physician has recommended that you wear a 24 HOUR  holter monitor. Holter monitors are medical devices that record the heart's electrical activity. Doctors most often use these monitors to diagnose arrhythmias. Arrhythmias are problems with the speed or rhythm of the heartbeat. The monitor is a small, portable device. You can wear one while you do your normal daily activities. This is usually used to diagnose what is causing palpitations/syncope (passing out).  Your physician has requested that you have an exercise tolerance test. For further information please visit [email or web address]. Please also follow instruction sheet, as given.   Follow-Up:  A COUPLE OF DAYS AFTER GXT  WITH  WITH EP AS NEW PT WITH EP DEPEARTMENT   Any Other Special Instructions Will Be Listed Below (If Applicable).

## 2015-09-08 NOTE — Progress Notes (Signed)
09/08/2015 Joseph Garner   December 30, 1923  [id number]  Primary Physician Wenda Low, MD Primary Cardiologist: Dr. Marlou Porch   Reason for Visit/CC: Bradycardia  HPI:  The patient is a 81 year old male, followed by Dr. Marlou Porch. His past cardiac history is significant for CAD status post bypass surgery 1 to the diagonal branch in 2014, history of severe aortic stenosis status post bioprostetic aortic valve replacement in [id number], chronic diastolic CHF, anemia, hypertension and GERD.  He presents to clinic today for follow-up regarding a slow heart rate. 2 days ago he felt mild dyspnea which he contributes to recent anxiety. He and his wife are contemplating moving into an assisted living facility and this has caused a great deal of stress. During the time of his dyspnea, he checked his BP which was elevated in the [id number] systolic. He noted his HR was in the 40s but he denied any other symptoms.   He now presents to clinic for further evaluation of his bradycardia. His EKG shows junctional rhythm with a rate of 48 bpm. This is a new finding. Previous EKGs revealed normal sinus rhythm with first-degree AV block. Review of medications reveals that he is not on any AV nodal blocking agents. He denies any recent diarrhea vomiting or excessive urination. No recent chest pain, dizziness, lightheadedness, syncope/near-syncope and no palpitations. He denies any dyspnea currently.    Current Outpatient Prescriptions  Medication Sig Dispense Refill  . amoxicillin (AMOXIL) 500 MG tablet Take 500 mg by mouth as needed (Take 4 before dental appt).    Marland Kitchen aspirin 81 MG tablet Take 81 mg by mouth daily.    Marland Kitchen atorvastatin (LIPITOR) 20 MG tablet Take 1 tablet (20 mg total) by mouth daily at 6 PM.    . ferrous fumarate (HEMOCYTE - 106 MG FE) 325 (106 FE) MG TABS tablet Take 1 tablet (106 mg of iron total) by mouth 2 (two) times daily. 30 each 0  . fluticasone (FLONASE) 50 MCG/ACT nasal spray Place 2 sprays into the nose  daily as needed for allergies.     . folic acid (FOLVITE) 1 MG tablet Take 1 tablet (1 mg total) by mouth daily.    . furosemide (LASIX) 40 MG tablet Take 40 mg by mouth as needed (Patient takes 1/2 pill).    Marland Kitchen lisinopril (PRINIVIL,ZESTRIL) 2.5 MG tablet Take 2.5 mg by mouth daily.    Marland Kitchen loratadine (CLARITIN) 10 MG tablet Take 10 mg by mouth daily as needed for allergies.     . Multiple Vitamin (MULTIVITAMIN WITH MINERALS) TABS Take 1 tablet by mouth daily.    . Multiple Vitamins-Minerals (EYE VITAMINS PO) Take 1 tablet by mouth daily. Perservision Vitamin    . omeprazole (PRILOSEC) 20 MG capsule Take 20 mg by mouth daily.    Marland Kitchen oxybutynin (DITROPAN XL) 15 MG 24 hr tablet Take 15 mg by mouth at bedtime.    . potassium chloride SA (K-DUR,KLOR-CON) 20 MEQ tablet Take 1 tablet (20 mEq total) by mouth daily. 30 tablet 12  . tamsulosin (FLOMAX) 0.4 MG CAPS capsule Take 0.4 mg by mouth.    . traMADol (ULTRAM) 50 MG tablet Take by mouth every 6 (six) hours as needed for moderate pain.     No current facility-administered medications for this visit.    No Known Allergies  Social History   Social History  . Marital Status: Married    Spouse Name: N/A  . Number of Children: 1  . Years of Education: N/A   Occupational  History  . retired from [employer]    Social History Main Topics  . Smoking status: Former Smoker -- 1.50 packs/day for 20 years    Types: Cigarettes    Quit date: 10/29/1967  . Smokeless tobacco: Never Used  . Alcohol Use: Yes     Comment: occasionally  . Drug Use: No  . Sexual Activity: Not Currently   Other Topics Concern  . Not on file   Social History Narrative     Review of Systems: General: negative for chills, fever, night sweats or weight changes.  Cardiovascular: negative for chest pain, dyspnea on exertion, edema, orthopnea, palpitations, paroxysmal nocturnal dyspnea or shortness of breath Dermatological: negative for rash Respiratory: negative for cough or  wheezing Urologic: negative for hematuria Abdominal: negative for nausea, vomiting, diarrhea, bright red blood per rectum, melena, or hematemesis Neurologic: negative for visual changes, syncope, or dizziness All other systems reviewed and are otherwise negative except as noted above.    Blood pressure 98/50, pulse 48, height 5\' 8"  (1.727 m), weight 155 lb 12.8 oz (70.67 kg).  General appearance: alert, cooperative and no distress Neck: no carotid bruit and no JVD Lungs: clear to auscultation bilaterally Heart: Regular rhythm, bradycardia Extremities: no LEE Pulses: 2+ and symmetric Skin: warm and dry Neurologic: Grossly normal  EKG show rhythm. Bradycardia. 48 bpm.  ASSESSMENT AND PLAN:   1. Junctional rhythm/bradycardia: EKG today shows junctional rhythm with bradycardia with a rate of 48 bpm. He is not on any AV nodal blocking agents. He has a history of aortic valve replacement thus he may  be at risk for conduction abnormalities. We will obtain basic lab work including a basic metabolic panel, magnesium, TSH and CBC. We will also  Order a 24 hour heart monitor to assess his average heart rate. We will also have him undergo a treadmill tolerance test to rule out chronotropic incompetence.  We will place a referral to the EP clinic to be seen by an electrophysiologist to determine whether or not he will need a permanent pacemaker.  2. CAD: History of single-vessel bypass surgery in 2014. He denies any chest pain. Continue ASA. No BB due to bradycardia. Continue statin.   3. History of aortic stenosis: Status post bioprosthetic aortic valve replacement in 2014.  PLAN  F/u with EP for possible PPM evaluation.   Lyda Jester PA-C 09/08/2015 2:31 PM

## 2015-09-09 LAB — TSH: TSH: 1.618 u[IU]/mL (ref 0.350–4.500)

## 2015-09-13 ENCOUNTER — Telehealth: Payer: Self-pay | Admitting: Cardiology

## 2015-09-13 NOTE — Telephone Encounter (Signed)
Pt rtn call to Pam-wants call back asap [phone]

## 2015-09-13 NOTE — Telephone Encounter (Signed)
Reviewed results of lab with pt who stated understanding and thanked me for my time.

## 2015-09-19 ENCOUNTER — Emergency Department (HOSPITAL_COMMUNITY)
Admission: EM | Admit: 2015-09-19 | Discharge: 2015-09-19 | Disposition: A | Discharge status: Home / Self Care | Payer: Medicare Other | Attending: Emergency Medicine | Admitting: Emergency Medicine

## 2015-09-19 ENCOUNTER — Encounter (HOSPITAL_COMMUNITY): Payer: Self-pay | Admitting: *Deleted

## 2015-09-19 DIAGNOSIS — H9193 Unspecified hearing loss, bilateral: Secondary | ICD-10-CM | POA: Diagnosis not present

## 2015-09-19 DIAGNOSIS — I10 Essential (primary) hypertension: Secondary | ICD-10-CM | POA: Diagnosis not present

## 2015-09-19 DIAGNOSIS — Z87891 Personal history of nicotine dependence: Secondary | ICD-10-CM | POA: Insufficient documentation

## 2015-09-19 DIAGNOSIS — Z951 Presence of aortocoronary bypass graft: Secondary | ICD-10-CM | POA: Insufficient documentation

## 2015-09-19 DIAGNOSIS — I5032 Chronic diastolic (congestive) heart failure: Secondary | ICD-10-CM | POA: Insufficient documentation

## 2015-09-19 DIAGNOSIS — M1990 Unspecified osteoarthritis, unspecified site: Secondary | ICD-10-CM | POA: Diagnosis not present

## 2015-09-19 DIAGNOSIS — Z7982 Long term (current) use of aspirin: Secondary | ICD-10-CM | POA: Insufficient documentation

## 2015-09-19 DIAGNOSIS — K219 Gastro-esophageal reflux disease without esophagitis: Secondary | ICD-10-CM | POA: Insufficient documentation

## 2015-09-19 DIAGNOSIS — Z872 Personal history of diseases of the skin and subcutaneous tissue: Secondary | ICD-10-CM | POA: Insufficient documentation

## 2015-09-19 DIAGNOSIS — N400 Enlarged prostate without lower urinary tract symptoms: Secondary | ICD-10-CM | POA: Insufficient documentation

## 2015-09-19 DIAGNOSIS — D649 Anemia, unspecified: Secondary | ICD-10-CM | POA: Insufficient documentation

## 2015-09-19 DIAGNOSIS — F329 Major depressive disorder, single episode, unspecified: Secondary | ICD-10-CM | POA: Insufficient documentation

## 2015-09-19 DIAGNOSIS — Z79899 Other long term (current) drug therapy: Secondary | ICD-10-CM | POA: Diagnosis not present

## 2015-09-19 LAB — I-STAT CHEM 8, ED
BUN: 17 mg/dL (ref 6–20)
CALCIUM ION: 1.1 mmol/L — AB (ref 1.13–1.30)
Chloride: 97 mmol/L — ABNORMAL LOW (ref 101–111)
Creatinine, Ser: 1.2 mg/dL (ref 0.61–1.24)
Glucose, Bld: 98 mg/dL (ref 65–99)
HCT: 34 % — ABNORMAL LOW (ref 39.0–52.0)
Hemoglobin: 11.6 g/dL — ABNORMAL LOW (ref 13.0–17.0)
Potassium: 4.4 mmol/L (ref 3.5–5.1)
SODIUM: 133 mmol/L — AB (ref 135–145)
TCO2: 25 mmol/L (ref 0–100)

## 2015-09-19 LAB — I-STAT TROPONIN, ED: TROPONIN I, POC: 0 ng/mL (ref 0.00–0.08)

## 2015-09-19 NOTE — ED Notes (Signed)
Family reporting pt has had elevated BP today.  Reports that he has recently seen cardiologist and has appointment on Monday for testing. Pt denies CP at present time.  Reporting some SOB.

## 2015-09-19 NOTE — ED Provider Notes (Signed)
CSN: [id number]     Arrival date & time 09/19/15  1924 History   First MD Initiated Contact with Patient 09/19/15 2049     Chief Complaint  Patient presents with  . Hypertension      HPI Pt was seen at 2055. Per pt and his family, c/o unknown onset and persistence of constant "high blood pressure" that began today. Pt states he took his BP twice today. States the 1st SBP reading was "180" and the 2nd was "190." Pt states he has taken his meds today as previously prescribed. Pt states he has a Cards appt on Monday for a stress test and Holter monitor placement for "low HR and BP" at his last Cards office visit on 09/08/15. Denies any complaints. Denies CP/SOB, no abd pain, no N/V/D, no syncope/near syncope, no visual changes, no focal motor weakness, no headache.     Past Medical History  Diagnosis Date  . BPH (benign prostatic hypertrophy)   . Diverticulosis 2006  . Depression   . Anemia   . Spinal stenosis   . Lung nodules 2010  . Actinic keratosis, hx of   . Macular degeneration   . Hypertension   . Allergy     allergic rhinitis  . Hearing loss     wears bilateral hearing aids  . Inguinal hernia     left side -surgery planned  . GERD (gastroesophageal reflux disease)   . Arthritis     generalized-more back issues  . Severe aortic stenosis 04/16/2013  . Nodule of right lung 05/24/2013  . CHF (congestive heart failure) ([place])   . Shortness of breath   . Atrial flutter ([place])   . Chronic diastolic heart failure ([place]) 08/11/2013   Past Surgical History  Procedure Laterality Date  . Tonsillectomy  1933  . Appendectomy  1947  . Neck surgery  1997    compressed nerve  . Skin biopsy  2003-2011    multiple   . Cataract extraction, bilateral      bilateral  . Inguinal hernia repair Left 02/11/2013    Procedure: LEFT LAPAROSCOPIC REPAIR INGUINAL HERNIA WITH MESH;  Surgeon: Adin Hector, MD;  [facility];  Service: General;  Laterality: Left;  . Insertion of mesh Left  02/11/2013    Procedure: INSERTION OF MESH;  Surgeon: Adin Hector, MD;  [facility];  Service: General;  Laterality: Left;  . Hernia repair  02/24/13    lap LIH repair  . Aortic valve replacement N/A 06/04/2013    Procedure: AORTIC VALVE REPLACEMENT (AVR);  Surgeon: Grace Isaac, MD;  Location: [place];  Service: Open Heart Surgery;  Laterality: N/A;  . Intraoperative transesophageal echocardiogram N/A 06/04/2013    Procedure: INTRAOPERATIVE TRANSESOPHAGEAL ECHOCARDIOGRAM;  Surgeon: Grace Isaac, MD;  Location: [facility];  Service: Open Heart Surgery;  Laterality: N/A;  . Coronary artery bypass graft N/A 06/04/2013    Procedure: CORONARY ARTERY BYPASS GRAFTING (CABG);  Surgeon: Grace Isaac, MD;  Location: [place];  Service: Open Heart Surgery;  Laterality: N/A;  . Wedge resection Right 06/04/2013    Procedure: WEDGE RESECTION Right Lower Lobe;  Surgeon: Grace Isaac, MD;  Location: [place];  Service: Open Heart Surgery;  Laterality: Right;   Family History  Problem Relation Age of Onset  . Prostate cancer Father   . Cancer Mother     passed away age 29  . Cancer Father   . Cancer Brother   . Hypertension Brother  Social History  Substance Use Topics  . Smoking status: Former Smoker -- 1.50 packs/day for 20 years    Types: Cigarettes    Quit date: 10/29/1967  . Smokeless tobacco: Never Used  . Alcohol Use: Yes     Comment: occasionally    Review of Systems ROS: Statement: All systems negative except as marked or noted in the HPI; Constitutional: Negative for fever and chills. ; ; Eyes: Negative for eye pain, redness and discharge. ; ; ENMT: Negative for ear pain, hoarseness, nasal congestion, sinus pressure and sore throat. ; ; Cardiovascular: Negative for chest pain, palpitations, diaphoresis, dyspnea and peripheral edema. ; ; Respiratory: Negative for cough, wheezing and stridor. ; ; Gastrointestinal: Negative for nausea, vomiting, diarrhea, abdominal pain, blood in  stool, hematemesis, jaundice and rectal bleeding. . ; ; Genitourinary: Negative for dysuria, flank pain and hematuria. ; ; Musculoskeletal: Negative for back pain and neck pain. Negative for swelling and trauma.; ; Skin: Negative for pruritus, rash, abrasions, blisters, bruising and skin lesion.; ; Neuro: Negative for headache, lightheadedness and neck stiffness. Negative for weakness, altered level of consciousness , altered mental status, extremity weakness, paresthesias, involuntary movement, seizure and syncope.      Allergies  Review of patient's allergies indicates no known allergies.  Home Medications   Prior to Admission medications   Medication Sig Start Date End Date Taking? Authorizing Provider  aspirin 81 MG tablet Take 81 mg by mouth daily.   Yes Historical Provider, MD  atorvastatin (LIPITOR) 20 MG tablet Take 1 tablet (20 mg total) by mouth daily at 6 PM. Patient taking differently: Take 20 mg by mouth 3 (three) times a week.  06/15/13  Yes Erin R Barrett, PA-C  ferrous fumarate (HEMOCYTE - 106 MG FE) 325 (106 FE) MG TABS tablet Take 1 tablet (106 mg of iron total) by mouth 2 (two) times daily. 06/15/13  Yes Erin R Barrett, PA-C  fluticasone (FLONASE) 50 MCG/ACT nasal spray Place 2 sprays into the nose daily as needed for allergies.    Yes Historical Provider, MD  folic acid (FOLVITE) 1 MG tablet Take 1 tablet (1 mg total) by mouth daily. 06/15/13  Yes Erin R Barrett, PA-C  furosemide (LASIX) 40 MG tablet Take 20 mg by mouth as needed for fluid or edema (Patient takes 1/2 pill).    Yes Historical Provider, MD  loratadine (CLARITIN) 10 MG tablet Take 10 mg by mouth daily as needed for allergies.    Yes Historical Provider, MD  Multiple Vitamin (MULTIVITAMIN WITH MINERALS) TABS Take 1 tablet by mouth daily.   Yes Historical Provider, MD  Multiple Vitamins-Minerals (EYE VITAMINS PO) Take 1 tablet by mouth daily. Perservision Vitamin   Yes Historical Provider, MD  omeprazole (PRILOSEC)  20 MG capsule Take 20 mg by mouth daily.   Yes Historical Provider, MD  oxybutynin (DITROPAN XL) 15 MG 24 hr tablet Take 15 mg by mouth at bedtime.   Yes Historical Provider, MD  potassium chloride SA (K-DUR,KLOR-CON) 20 MEQ tablet Take 1 tablet (20 mEq total) by mouth daily. 01/11/14  Yes Jerline Pain, MD  tamsulosin (FLOMAX) 0.4 MG CAPS capsule Take 0.4 mg by mouth daily.    Yes Historical Provider, MD  amoxicillin (AMOXIL) 500 MG tablet Take 500 mg by mouth as needed (Take 4 before dental appt).    Historical Provider, MD  lisinopril (PRINIVIL,ZESTRIL) 2.5 MG tablet Take 2.5 mg by mouth daily.    Historical Provider, MD  traMADol (ULTRAM) 50 MG tablet  Take by mouth every 6 (six) hours as needed for moderate pain.    Historical Provider, MD   BP 181/77 mmHg  Pulse 67  Temp(Src) 97.9 F (36.6 C) (Oral)  Resp 18  Ht 5\' 8"  (1.727 m)  Wt 155 lb (70.308 kg)  BMI 23.57 kg/m2  SpO2 100%   Filed Vitals:   09/19/15 1937 09/19/15 2100 09/19/15 2115 09/19/15 2130  BP: 181/77 190/93  160/83  Pulse: 67 62 39 58  Temp: 97.9 F (36.6 C)     TempSrc: Oral     Resp: 18 19 10 12   Height: 5\' 8"  (1.727 m)     Weight: 155 lb (70.308 kg)     SpO2: 100% 100% 88% 100%     Physical Exam  2100: Physical examination:  Nursing notes reviewed; Vital signs and O2 SAT reviewed;  Constitutional: Well developed, Well nourished, Well hydrated, In no acute distress; Head:  Normocephalic, atraumatic; Eyes: EOMI, PERRL, No scleral icterus; ENMT: Mouth and pharynx normal, Mucous membranes moist; Neck: Supple, Full range of motion, No lymphadenopathy; Cardiovascular: Regular rate and rhythm, No gallop; Respiratory: Breath sounds clear & equal bilaterally, No wheezes.  Speaking full sentences with ease, Normal respiratory effort/excursion; Chest: Nontender, Movement normal; Abdomen: Soft, Nontender, Nondistended, Normal bowel sounds; Genitourinary: No CVA tenderness; Extremities: Pulses normal, No tenderness, No edema,  No calf edema or asymmetry.; Neuro: AA&Ox3, +HOH, otherwise major CN grossly intact. No facial droop. Speech clear. No gross focal motor or sensory deficits in extremities.; Skin: Color normal, Warm, Dry.   ED Course  Procedures (including critical care time) Labs Review   Imaging Review  I have personally reviewed and evaluated these images and lab results as part of my medical decision-making.   EKG Interpretation   Date/Time:  Tuesday September 19 2015 20:46:38 EST Ventricular Rate:  61 PR Interval:  398 QRS Duration: 148 QT Interval:  466 QTC Calculation: 469 R Axis:   83 Text Interpretation:  Sinus rhythm with 1st degree A-V block Right bundle  branch block When compared with ECG of 06/21/2013 T wave abnormality  Inferior leads is no longer Present Confirmed by [facility]  MD, Nunzio Cory  [phone]) on 09/19/2015 9:57:01 PM      MDM  MDM Reviewed: previous chart, nursing note and vitals Reviewed previous: labs and ECG Interpretation: labs and ECG     Results for orders placed or performed during the hospital encounter of 09/19/15  I-stat Chem 8, ED  Result Value Ref Range   Sodium 133 (L) 135 - 145 mmol/L   Potassium 4.4 3.5 - 5.1 mmol/L   Chloride 97 (L) 101 - 111 mmol/L   BUN 17 6 - 20 mg/dL   Creatinine, Ser 1.20 0.61 - 1.24 mg/dL   Glucose, Bld 98 65 - 99 mg/dL   Calcium, Ion 1.10 (L) 1.13 - 1.30 mmol/L   TCO2 25 0 - 100 mmol/L   Hemoglobin 11.6 (L) 13.0 - 17.0 g/dL   HCT 34.0 (L) 39.0 - 52.0 %  I-stat troponin, ED  Result Value Ref Range   Troponin i, poc 0.00 0.00 - 0.08 ng/mL   Comment 3             2215:  Workup reassuring. Labs and EKG per baseline. Pt continues to deny any complaints. Pt now reveals that his PMD took him off his lisinopril approximately 1 month ago. Pt had low BP and HR in Cards office on 09/08/15 visit: confirms he was not taking his lisinopril  at that time. Will refer back to Cards MD regarding further meds changes. Pt and family are  agreeable with this plan. Dx and testing d/w pt and family.  Questions answered.  Verb understanding, agreeable to d/c home with outpt f/u.   Francine Graven, DO 09/22/15 1409

## 2015-09-19 NOTE — Discharge Instructions (Signed)
°Emergency Department Resource Guide °1) Find a Doctor and Pay Out of Pocket °Although you won't have to find out who is covered by your insurance plan, it is a good idea to ask around and get recommendations. You will then need to call the office and see if the doctor you have chosen will accept you as a new patient and what types of options they offer for patients who are self-pay. Some doctors offer discounts or will set up payment plans for their patients who do not have insurance, but you will need to ask so you aren't surprised when you get to your appointment. ° °2) Contact Your Local Health Department °Not all health departments have doctors that can see patients for sick visits, but many do, so it is worth a call to see if yours does. If you don't know where your local health department is, you can check in your phone book. The CDC also has a tool to help you locate your state's health department, and many state websites also have listings of all of their local health departments. ° °3) Find a Walk-in Clinic °If your illness is not likely to be very severe or complicated, you may want to try a walk in clinic. These are popping up all over the country in pharmacies, drugstores, and shopping centers. They're usually staffed by nurse practitioners or physician assistants that have been trained to treat common illnesses and complaints. They're usually fairly quick and inexpensive. However, if you have serious medical issues or chronic medical problems, these are probably not your best option. ° °No Primary Care Doctor: °- Call Health Connect at  832-8000 - they can help you locate a primary care doctor that  accepts your insurance, provides certain services, etc. °- Physician Referral Service- 1-800-533-3463 ° °Chronic Pain Problems: °Organization         Address  Phone   Notes  °[facility]  (336) 297-2271 Patients need to be referred by their primary care doctor.  ° °Medication  Assistance: °Organization         Address  Phone   Notes  °[place] [facility] [address] °[place], [place] [address] (336) 641-8030 --Must be a resident of [place] °-- Must have NO insurance coverage whatsoever (no Medicaid/ Medicare, etc.) °-- The pt. MUST have a primary care doctor that directs their care regularly and follows them in the community °  °[facility]  (866) 331-1348   °[employer]  (888) 892-1162   ° °Agencies that provide inexpensive medical care: °Organization         Address  Phone   Notes  °[facility]  (336) 832-8035   °[facility]    (336) 832-7272   °[facility] [address] °[place], [place] [address] (336) 832-4777   °[facility] [address], °[place] (336) 271-4999   °[facility]    (336) 373-0678   °[facility]    (336) 272-1050   °[facility] ° [address], [place] Phone:  (336) 832-4444, Fax:  (336) 832-4440 Hours of Operation:  9 am - 6 pm, M-F.  Also accepts Medicaid/Medicare and self-pay.  °[facility] ° [address], [place] Phone: (336) 832-3150, Fax: (336) 832-3151. Hours of Operation:  8:30 am - 5:30 pm, M-F.  Also accepts Medicaid and self-pay.  °[facility] [place] [address], [place] Phone: (336) 878-6027   °[facility] [address], [place], [place] (336)723-1848, Ext. 123 Mondays & Thursdays: 7-9 AM.  First 15 patients are seen on a first come, first serve basis. °  ° °Medicaid-accepting [place] Providers: ° °Organization         Address  Phone   Notes  °[facility] [address], [place] (336) 641-2100 Also accepts self-pay patients.  °[facility] [address], [place] ° (336) 856-9996   °[facility] [address], [place]  (336) 288-8857   °[facility] [address] [place] [address], [place] (336) 299-7000   °Veita Bland [address], [place]  ° (336) 373-1557 Only accepts [place] Access Medicaid patients after they have their name applied to their card.  ° °Self-Pay (no insurance) in [place]: ° °Organization         Address  Phone   Notes  °Sickle Cell Patients, [facility] [address], [place] (336) 832-1970   °[facility] [address], [place] (336) 832-4400   °[facility] ° [address], [place] (336) 992-4800   °[facility]/Dr. Osei-Bonsu ° [address] [place] [address], [place] or [address], [place] (336) 841-8500 Phone number for both [place] and [place] locations is the same.  °[facility] [address], [place] (336) 299-0000   °[facility] [address] [place] [address], [place] or [address] (336) 852-7530 °(336) 878-2260   °[facility] [address], [place] (336) 350-1642, phone; (336) 294-5005, fax Sees patients 1st and 3rd Saturday of every month.  Must not qualify for public or private insurance (i.e. Medicaid, Medicare, [place] Health Choice, Veterans' Benefits) • Household income should be no more than 200% of the poverty level •The clinic cannot treat you if you are pregnant or think you are pregnant • Sexually transmitted diseases are not treated at the clinic.  ° ° °Dental Care: °Organization         Address  Phone  Notes  °[place] [facility] [address],  (336) 641-6152 Accepts children up to age 21 who are enrolled in Medicaid or [place] Health Choice; pregnant women with a Medicaid card; and children who have applied for Medicaid or [place] Health Choice, but were declined, whose parents can pay a reduced fee at time of service.  °[place]  [facility] [place]  [address], [place] (336) 641-7733 Accepts children up to age 21 who are enrolled in Medicaid or [place] Health Choice; pregnant women with a Medicaid card; and children who have applied for Medicaid or [place] Health Choice, but were declined, whose parents can pay a reduced fee at time of service.  °[facility] ° [address],  (336) 641-4533 Patients are seen by appointment only. Walk-ins are not accepted. [facility] will see patients 18 years of age and older. °Monday - Tuesday (8am-5pm) °Most Wednesdays (8:30-5pm) °$30 per visit, cash only  °[facility] ° [address], [place] (336) 641-4533 Patients are seen by appointment only. Walk-ins are not accepted. [facility] will see patients 18 years of age and older. °One   Wednesday Evening (Monthly: Volunteer Based).  $30 per visit, cash only  °[facility]  (919) 537-3737 for adults; Children under age 4, call Graduate Pediatric Dentistry at (919) [phone]. Children aged 4-14, please call (919) [phone] to request a pediatric application. ° Dental services are provided in all areas of dental care including fillings, crowns and bridges, complete and partial dentures, implants, gum treatment, root canals, and extractions. Preventive care is also provided. Treatment is provided to both adults and children. °Patients are selected via a lottery and there is often a waiting list. °  °[facility] [address], °[place] ° (336) 763-8833 [email or web address] °  °[facility] [address], [place], [place] (336)723-1848, Ext. 123 Second and Fourth Thursday of each month, opens at 6:30 AM; Clinic ends at 9 AM.  Patients are seen on a first-come first-served basis, and a limited number are seen during each clinic.  ° °[facility] ° [address], [place], [place] (336) 723-7904    Eligibility Requirements °You must have lived in [place], or [place] counties for at least the last three months. °  You cannot be eligible for state or federal sponsored healthcare insurance, including Veterans Administration, Medicaid, or Medicare. °  You generally cannot be eligible for healthcare insurance through your employer.  °  How to apply: °Eligibility screenings are held every Tuesday and Wednesday afternoon from 1:00 pm until 4:00 pm. You do not need an appointment for the interview!  °[facility] [address], [place], [place] 336-631-2330   °[facility]  336-342-8273   °[place]  336-703-3100   °[facility]  336-570-6415   ° °Behavioral Health Resources in the Community: °Intensive Outpatient Programs °Organization         Address  Phone  Notes  °[facility] [address], [place], [place] 336-878-6098   °[facility] [address], [place], [place] 336-832-9800   °[facility] [address], [place], [place] ° 336-882-2125   °[facility] [address],  °[place], [place] 1-800-853-5163 or 336-641-4981   °Substance Abuse Resources °Organization         Address  Phone  Notes  °[facility]  336-882-2125   °[facility]  336-784-9470   °[facility]  336-285-9073   °[facility]  336-845-3988   °Residential & Outpatient Substance Abuse Program  1-800-659-3381   °Psychological Services °Organization         Address  Phone  Notes  °[facility]  336- 832-9600   °[facility]  336- 378-7881   °[facility] [address], [place] 1-800-853-5163 or 336-641-4981   ° °Mobile Crisis Teams °Organization         Address  Phone  Notes  °[facility], Mobile Crisis Care Unit  1-877-626-1772   °Assertive °[facility] ° [address]  [place], [place] 336-834-9664   °Sharon DeEsch [address] °[place] 336-554-5454   ° °Self-Help/Support Groups °Organization         Address  Phone             Notes  °[facility] - variety of support groups  336- 373-1402 Call for more information  °Narcotics Anonymous (NA), [facility] [address], °[place]  2 meetings at this location  ° °  Residential Treatment Programs Organization         Address  Phone  Notes  [facility] [address]    [place]  1-(270)230-1095   [facility]  [address] [place] [id number], [place], [place]   [place], [place] [phone] Admissions: 8am-3pm M-F  [facility] [place] [address]    [place], [place] X4321937   [facility] [address] [place], [place], [place]   [facility] [address],  [place], [place]   [facility] - Intensive Outpatient [address], [place], [place]   [facility] [place]  [place], [place] 1-510-230-2551 or 503-707-3732   [facility] [address] [place], [place] Accepts Medicaid  [facility] [address],  [place] 1-(641) 121-1905 Substance Abuse/Addiction Treatment   [facility] Organization         Address  Phone  Notes  [facility]  (253)702-9560   Domenic Schwab, PhD [address] Arlis Porta [place], [place]   540-197-6045 or (857) 335-3540   [facility] [place], [place] 651-829-4210   [facility] [address] [place], [place] (574) 466-4410 Insurance/Medicaid/sponsorship through [facility] and Families [address]                                    [place], [place] [phone] [place] [address] [place], [place] [phone]    Dr. Adele Schilder  [phone]   [facility] Dept. 1) [address] [place] 2) [place] 3)  [address], [place] [phone] [phone]  [phone]   [place] (972) 087-4535 or [phone] (After Hours)      Take your usual prescriptions as previously directed. Take your blood pressure only ONCE per day, once or twice per week, either in the morning after you take your medicine(s) or in the evening before you go to bed.  Always sit quietly for at least 15 minutes before taking your blood pressure.  Keep a diary of your blood pressures to show your doctor at your follow up office visit.  Call your regular Cardiologist and medical doctor tomorrow morning to schedule a follow up appointment in the next 2 to 3 days to make further recommendations on whether to re-start your lisinopril.  Return to the Emergency Department immediately sooner if worsening.

## 2015-09-20 ENCOUNTER — Other Ambulatory Visit: Payer: Self-pay | Admitting: Cardiology

## 2015-09-20 ENCOUNTER — Telehealth: Payer: Self-pay | Admitting: Cardiology

## 2015-09-20 DIAGNOSIS — R001 Bradycardia, unspecified: Secondary | ICD-10-CM

## 2015-09-20 DIAGNOSIS — I498 Other specified cardiac arrhythmias: Secondary | ICD-10-CM

## 2015-09-20 DIAGNOSIS — I440 Atrioventricular block, first degree: Secondary | ICD-10-CM

## 2015-09-20 DIAGNOSIS — Z951 Presence of aortocoronary bypass graft: Secondary | ICD-10-CM

## 2015-09-20 NOTE — Telephone Encounter (Signed)
Pt went to the ER last night because his BP was elevated and he "felt bad all over."  His BP was 192/? - he doesn't know the "bottom number."  He has not checked it today.  Before he left the hospital last night it was 160/83 and 151/83.  No new medications were started and he was instructed to f/u with his PCP and cardiologist.  He is already scheduled for GXT and holter monitor placement for Monday here.  Advised pt to monitor his BP through the weekend and keep a dairy of it to bring with him to his appt on Monday.  Advised to watch his NA intake very carefully over the holiday and weekend.  Also advised to call MD on call if BP greater than 200/100.  He reports he has also called his PCP.  Advised PCP can treat his BP but to make sure he does not start any medications for his BP that will decrease his HR.  Pt states understanding and will keep his appts as scheduled.

## 2015-09-20 NOTE — Telephone Encounter (Signed)
New message      Pt was seen in ER last night for high bp.  They told him to see his cardiologist within 3 days.  Please call and let him know if he can be seen today

## 2015-09-25 ENCOUNTER — Ambulatory Visit (INDEPENDENT_AMBULATORY_CARE_PROVIDER_SITE_OTHER): Payer: Medicare Other

## 2015-09-25 ENCOUNTER — Encounter: Payer: Medicare Other | Admitting: Cardiology

## 2015-09-25 DIAGNOSIS — I498 Other specified cardiac arrhythmias: Secondary | ICD-10-CM

## 2015-09-25 DIAGNOSIS — Z951 Presence of aortocoronary bypass graft: Secondary | ICD-10-CM | POA: Diagnosis not present

## 2015-09-25 DIAGNOSIS — R001 Bradycardia, unspecified: Secondary | ICD-10-CM

## 2015-09-25 DIAGNOSIS — I440 Atrioventricular block, first degree: Secondary | ICD-10-CM

## 2015-09-25 LAB — EXERCISE TOLERANCE TEST
CSEPED: 4 min
CSEPEDS: 16 s
CSEPEW: 5.6 METS
CSEPPHR: 108 {beats}/min
MPHR: 131 {beats}/min
Percent HR: 82 %
RPE: 19
Rest HR: 64 {beats}/min

## 2015-09-25 NOTE — Telephone Encounter (Signed)
Agree with plan.  SKAINS, MARK, MD  

## 2015-09-28 ENCOUNTER — Ambulatory Visit
Admission: RE | Admit: 2015-09-28 | Discharge: 2015-09-28 | Disposition: A | Discharge status: Home / Self Care | Payer: Medicare Other | Source: Ambulatory Visit | Attending: Internal Medicine | Admitting: Internal Medicine

## 2015-09-28 ENCOUNTER — Other Ambulatory Visit: Payer: Self-pay | Admitting: Internal Medicine

## 2015-09-28 DIAGNOSIS — R05 Cough: Secondary | ICD-10-CM | POA: Diagnosis not present

## 2015-09-28 DIAGNOSIS — K219 Gastro-esophageal reflux disease without esophagitis: Secondary | ICD-10-CM | POA: Diagnosis not present

## 2015-09-28 DIAGNOSIS — Z1389 Encounter for screening for other disorder: Secondary | ICD-10-CM | POA: Diagnosis not present

## 2015-09-28 DIAGNOSIS — N402 Nodular prostate without lower urinary tract symptoms: Secondary | ICD-10-CM | POA: Diagnosis not present

## 2015-09-28 DIAGNOSIS — I471 Supraventricular tachycardia: Secondary | ICD-10-CM | POA: Diagnosis not present

## 2015-09-28 DIAGNOSIS — I10 Essential (primary) hypertension: Secondary | ICD-10-CM | POA: Diagnosis not present

## 2015-09-28 DIAGNOSIS — Z0000 Encounter for general adult medical examination without abnormal findings: Secondary | ICD-10-CM | POA: Diagnosis not present

## 2015-09-28 DIAGNOSIS — Z952 Presence of prosthetic heart valve: Secondary | ICD-10-CM | POA: Diagnosis not present

## 2015-09-28 DIAGNOSIS — R059 Cough, unspecified: Secondary | ICD-10-CM

## 2015-09-28 DIAGNOSIS — I2510 Atherosclerotic heart disease of native coronary artery without angina pectoris: Secondary | ICD-10-CM | POA: Diagnosis not present

## 2015-09-28 DIAGNOSIS — H8113 Benign paroxysmal vertigo, bilateral: Secondary | ICD-10-CM | POA: Diagnosis not present

## 2015-09-28 DIAGNOSIS — N183 Chronic kidney disease, stage 3 (moderate): Secondary | ICD-10-CM | POA: Diagnosis not present

## 2015-09-28 DIAGNOSIS — E7800 Pure hypercholesterolemia, unspecified: Secondary | ICD-10-CM | POA: Diagnosis not present

## 2015-09-29 ENCOUNTER — Encounter: Payer: Medicare Other | Admitting: Cardiology

## 2015-10-04 ENCOUNTER — Ambulatory Visit (INDEPENDENT_AMBULATORY_CARE_PROVIDER_SITE_OTHER): Payer: Medicare Other | Admitting: Internal Medicine

## 2015-10-04 ENCOUNTER — Encounter: Payer: Self-pay | Admitting: Internal Medicine

## 2015-10-04 VITALS — BP 126/72 | HR 70 | Ht 68.0 in | Wt 152.6 lb

## 2015-10-04 DIAGNOSIS — I5032 Chronic diastolic (congestive) heart failure: Secondary | ICD-10-CM | POA: Diagnosis not present

## 2015-10-04 DIAGNOSIS — I452 Bifascicular block: Secondary | ICD-10-CM

## 2015-10-04 DIAGNOSIS — Z7901 Long term (current) use of anticoagulants: Secondary | ICD-10-CM

## 2015-10-04 DIAGNOSIS — I484 Atypical atrial flutter: Secondary | ICD-10-CM | POA: Diagnosis not present

## 2015-10-04 MED ORDER — RIVAROXABAN 15 MG PO TABS: 15.0000 mg | ORAL_TABLET | Freq: Every day | ORAL | Status: DC

## 2015-10-04 NOTE — Assessment & Plan Note (Signed)
His symptoms are class 2. He will continue his current meds.  

## 2015-10-04 NOTE — Assessment & Plan Note (Signed)
He has very marked first degree AV block with RBBB. He is at marked increased risk for complete heart block but had no evidence on cardiac monitoring. I have recommended watchful waiting.

## 2015-10-04 NOTE — Progress Notes (Signed)
HPI Joseph Garner is referred today by Dr. Marlou Porch for evaluation of atrial flutter and bradycardia. He is a pleasant 81 yo man with prior AVR, CAD, s/p CABG, who has had documented bradycardia. He has never had syncope. He was found to have atypical atrial flutter several weeks ago in clinic. He was asymptomatic. He has never had syncope or unexplained falls. He wore a cardiac monitor which demonstrated no symptomatic bradycardia. No other complaints. He does not have palpitations.  No Known Allergies   Current Outpatient Prescriptions  Medication Sig Dispense Refill  . amLODipine (NORVASC) 2.5 MG tablet Take 2.5 mg by mouth daily.    Marland Kitchen amoxicillin (AMOXIL) 500 MG tablet Take 500 mg by mouth as needed (Take 4 before dental appt).    Marland Kitchen atorvastatin (LIPITOR) 20 MG tablet Take 1 tablet (20 mg total) by mouth daily at 6 PM. (Patient taking differently: Take 20 mg by mouth 3 (three) times a week. )    . ferrous fumarate (HEMOCYTE - 106 MG FE) 325 (106 FE) MG TABS tablet Take 1 tablet (106 mg of iron total) by mouth 2 (two) times daily. 30 each 0  . fluticasone (FLONASE) 50 MCG/ACT nasal spray Place 2 sprays into the nose daily as needed for allergies.     . folic acid (FOLVITE) 1 MG tablet Take 1 tablet (1 mg total) by mouth daily.    . furosemide (LASIX) 40 MG tablet Take 20 mg by mouth as needed for fluid or edema (Patient takes 1/2 pill).     Marland Kitchen loratadine (CLARITIN) 10 MG tablet Take 10 mg by mouth daily as needed for allergies.     . Multiple Vitamin (MULTIVITAMIN WITH MINERALS) TABS Take 1 tablet by mouth daily.    . Multiple Vitamins-Minerals (EYE VITAMINS PO) Take 1 tablet by mouth daily. Perservision Vitamin    . omeprazole (PRILOSEC) 20 MG capsule Take 20 mg by mouth daily.    Marland Kitchen oxybutynin (DITROPAN XL) 15 MG 24 hr tablet Take 15 mg by mouth at bedtime.    . potassium chloride SA (K-DUR,KLOR-CON) 20 MEQ tablet Take 1 tablet (20 mEq total) by mouth daily. 30 tablet 12  . tamsulosin  (FLOMAX) 0.4 MG CAPS capsule Take 0.4 mg by mouth daily.     . traMADol (ULTRAM) 50 MG tablet Take by mouth every 6 (six) hours as needed for moderate pain.    . Rivaroxaban (XARELTO) 15 MG TABS tablet Take 1 tablet (15 mg total) by mouth daily with supper. 30 tablet 0  . Rivaroxaban (XARELTO) 15 MG TABS tablet Take 1 tablet (15 mg total) by mouth daily with supper. 30 tablet 3   No current facility-administered medications for this visit.     Past Medical History  Diagnosis Date  . BPH (benign prostatic hypertrophy)   . Diverticulosis 2006  . Depression   . Anemia   . Spinal stenosis   . Lung nodules 2010  . Actinic keratosis, hx of   . Macular degeneration   . Hypertension   . Allergy     allergic rhinitis  . Hearing loss     wears bilateral hearing aids  . Inguinal hernia     left side -surgery planned  . GERD (gastroesophageal reflux disease)   . Arthritis     generalized-more back issues  . Severe aortic stenosis 04/16/2013  . Nodule of right lung 05/24/2013  . CHF (congestive heart failure) ([place])   . Shortness of breath   .  Atrial flutter ([place])   . Chronic diastolic heart failure ([place]) 08/11/2013    ROS:   All systems reviewed and negative except as noted in the HPI.   Past Surgical History  Procedure Laterality Date  . Tonsillectomy  1933  . Appendectomy  1947  . Neck surgery  1997    compressed nerve  . Skin biopsy  2003-2011    multiple   . Cataract extraction, bilateral      bilateral  . Inguinal hernia repair Left 02/11/2013    Procedure: LEFT LAPAROSCOPIC REPAIR INGUINAL HERNIA WITH MESH;  Surgeon: Adin Hector, MD;  Location: [facility];  Service: General;  Laterality: Left;  . Insertion of mesh Left 02/11/2013    Procedure: INSERTION OF MESH;  Surgeon: Adin Hector, MD;  Location: [facility];  Service: General;  Laterality: Left;  . Hernia repair  02/24/13    lap LIH repair  . Aortic valve replacement N/A 06/04/2013    Procedure: AORTIC VALVE  REPLACEMENT (AVR);  Surgeon: Grace Isaac, MD;  Location: [facility];  Service: Open Heart Surgery;  Laterality: N/A;  . Intraoperative transesophageal echocardiogram N/A 06/04/2013    Procedure: INTRAOPERATIVE TRANSESOPHAGEAL ECHOCARDIOGRAM;  Surgeon: Grace Isaac, MD;  Location: [facility];  Service: Open Heart Surgery;  Laterality: N/A;  . Coronary artery bypass graft N/A 06/04/2013    Procedure: CORONARY ARTERY BYPASS GRAFTING (CABG);  Surgeon: Grace Isaac, MD;  Location: [facility];  Service: Open Heart Surgery;  Laterality: N/A;  . Wedge resection Right 06/04/2013    Procedure: WEDGE RESECTION Right Lower Lobe;  Surgeon: Grace Isaac, MD;  Location: [facility];  Service: Open Heart Surgery;  Laterality: Right;     Family History  Problem Relation Age of Onset  . Prostate cancer Father   . Cancer Mother     passed away age 70  . Cancer Father   . Cancer Brother   . Hypertension Brother      Social History   Social History  . Marital Status: Married    Spouse Name: N/A  . Number of Children: 1  . Years of Education: N/A   Occupational History  . retired from [employer]    Social History Main Topics  . Smoking status: Former Smoker -- 1.50 packs/day for 20 years    Types: Cigarettes    Quit date: 10/29/1967  . Smokeless tobacco: Never Used  . Alcohol Use: Yes     Comment: occasionally  . Drug Use: No  . Sexual Activity: Not Currently   Other Topics Concern  . Not on file   Social History Narrative     BP 126/72 mmHg  Pulse 70  Ht 5\' 8"  (1.727 m)  Wt 152 lb 9.6 oz (69.219 kg)  BMI 23.21 kg/m2  Physical Exam:  Elderly but Well appearing 81 yo man, NAD HEENT: Unremarkable Neck:  7 cm JVD, no thyromegally Lymphatics:  No adenopathy Back:  No CVA tenderness Lungs:  Clear with no wheezes HEART:  Regular rate rhythm, no murmurs, no rubs, no clicks Abd:  soft, positive bowel sounds, no organomegally, no rebound, no guarding Ext:  2 plus pulses, no edema, no  cyanosis, no clubbing Skin:  No rashes no nodules Neuro:  CN II through XII intact, motor grossly intact  EKG - NSR with marked first degree AV block and RBBB   Assess/Plan:

## 2015-10-04 NOTE — Patient Instructions (Signed)
Medication Instructions:  Your physician has recommended you make the following change in your medication:  1) START Xarelto 15 mg daily  Labwork: None ordered  Testing/Procedures: None ordered  Follow-Up: Your physician recommends that you schedule a follow-up appointment in: 2 months with Dr. Lovena Le.   Any Other Special Instructions Will Be Listed Below (If Applicable).  If you need a refill on your cardiac medications before your next appointment, please call your pharmacy.  Thank you for choosing [facility]!!

## 2015-10-04 NOTE — Assessment & Plan Note (Signed)
We have initiated anti-coagualtion therapy with Xarelto. His Cr[facility] suggests 15 mg daily.

## 2015-10-04 NOTE — Assessment & Plan Note (Signed)
His atrial flutter is atypical and asymptomatic. No indication for ablation. I strongly suspect he is having asymptomatic atrial fib as well. I have recommended he start systemic anti-coagulation and stop his ASA.

## 2015-10-09 ENCOUNTER — Telehealth: Payer: Self-pay | Admitting: Internal Medicine

## 2015-10-09 NOTE — Telephone Encounter (Signed)
Spoke with the patient and and let him know that the medication has been sent to his pharmacy.  He is stop by the [facility] office tomorrow and pick up a 30 day free card.  I spoke with [facility] and she will leave one up front for him

## 2015-10-09 NOTE — Telephone Encounter (Signed)
New Message  Pt calling about his new xarelto Rx. Please call back and discuss.

## 2015-10-10 ENCOUNTER — Telehealth: Payer: Self-pay | Admitting: *Deleted

## 2015-10-10 NOTE — Telephone Encounter (Signed)
Patient given card for free 30 day trial of Xarelto

## 2015-10-31 DIAGNOSIS — I10 Essential (primary) hypertension: Secondary | ICD-10-CM | POA: Diagnosis not present

## 2015-11-09 DIAGNOSIS — H353132 Nonexudative age-related macular degeneration, bilateral, intermediate dry stage: Secondary | ICD-10-CM | POA: Diagnosis not present

## 2015-11-09 DIAGNOSIS — Z961 Presence of intraocular lens: Secondary | ICD-10-CM | POA: Diagnosis not present

## 2015-11-10 ENCOUNTER — Encounter (HOSPITAL_COMMUNITY): Payer: Self-pay | Admitting: Emergency Medicine

## 2015-11-10 ENCOUNTER — Emergency Department (HOSPITAL_COMMUNITY)
Admission: EM | Admit: 2015-11-10 | Discharge: 2015-11-10 | Disposition: A | Discharge status: Home / Self Care | Payer: Medicare Other | Attending: Emergency Medicine | Admitting: Emergency Medicine

## 2015-11-10 ENCOUNTER — Emergency Department (HOSPITAL_COMMUNITY): Payer: Medicare Other

## 2015-11-10 DIAGNOSIS — I10 Essential (primary) hypertension: Secondary | ICD-10-CM | POA: Diagnosis not present

## 2015-11-10 DIAGNOSIS — N400 Enlarged prostate without lower urinary tract symptoms: Secondary | ICD-10-CM | POA: Insufficient documentation

## 2015-11-10 DIAGNOSIS — Z87891 Personal history of nicotine dependence: Secondary | ICD-10-CM | POA: Diagnosis not present

## 2015-11-10 DIAGNOSIS — Z7901 Long term (current) use of anticoagulants: Secondary | ICD-10-CM | POA: Diagnosis not present

## 2015-11-10 DIAGNOSIS — D649 Anemia, unspecified: Secondary | ICD-10-CM | POA: Insufficient documentation

## 2015-11-10 DIAGNOSIS — R200 Anesthesia of skin: Secondary | ICD-10-CM | POA: Diagnosis not present

## 2015-11-10 DIAGNOSIS — Z8739 Personal history of other diseases of the musculoskeletal system and connective tissue: Secondary | ICD-10-CM | POA: Diagnosis not present

## 2015-11-10 DIAGNOSIS — R011 Cardiac murmur, unspecified: Secondary | ICD-10-CM | POA: Insufficient documentation

## 2015-11-10 DIAGNOSIS — R202 Paresthesia of skin: Secondary | ICD-10-CM | POA: Diagnosis not present

## 2015-11-10 DIAGNOSIS — H9190 Unspecified hearing loss, unspecified ear: Secondary | ICD-10-CM | POA: Diagnosis not present

## 2015-11-10 DIAGNOSIS — K219 Gastro-esophageal reflux disease without esophagitis: Secondary | ICD-10-CM | POA: Insufficient documentation

## 2015-11-10 DIAGNOSIS — Z79899 Other long term (current) drug therapy: Secondary | ICD-10-CM | POA: Diagnosis not present

## 2015-11-10 DIAGNOSIS — Z872 Personal history of diseases of the skin and subcutaneous tissue: Secondary | ICD-10-CM | POA: Diagnosis not present

## 2015-11-10 DIAGNOSIS — I4892 Unspecified atrial flutter: Secondary | ICD-10-CM | POA: Insufficient documentation

## 2015-11-10 DIAGNOSIS — R531 Weakness: Secondary | ICD-10-CM | POA: Diagnosis not present

## 2015-11-10 DIAGNOSIS — Z8659 Personal history of other mental and behavioral disorders: Secondary | ICD-10-CM | POA: Insufficient documentation

## 2015-11-10 DIAGNOSIS — I5032 Chronic diastolic (congestive) heart failure: Secondary | ICD-10-CM | POA: Insufficient documentation

## 2015-11-10 LAB — RAPID URINE DRUG SCREEN, HOSP PERFORMED
Amphetamines: NOT DETECTED
Barbiturates: NOT DETECTED
Benzodiazepines: NOT DETECTED
Cocaine: NOT DETECTED
OPIATES: NOT DETECTED
Tetrahydrocannabinol: NOT DETECTED

## 2015-11-10 LAB — COMPREHENSIVE METABOLIC PANEL
ALBUMIN: 4 g/dL (ref 3.5–5.0)
ALT: 16 U/L — ABNORMAL LOW (ref 17–63)
ANION GAP: 5 (ref 5–15)
AST: 19 U/L (ref 15–41)
Alkaline Phosphatase: 62 U/L (ref 38–126)
BUN: 20 mg/dL (ref 6–20)
CALCIUM: 8.6 mg/dL — AB (ref 8.9–10.3)
CO2: 28 mmol/L (ref 22–32)
Chloride: 99 mmol/L — ABNORMAL LOW (ref 101–111)
Creatinine, Ser: 1.11 mg/dL (ref 0.61–1.24)
GFR calc Af Amer: >60 mL/min (ref >60)
GFR, EST NON AFRICAN AMERICAN: 57 mL/min — AB (ref >60)
Glucose, Bld: 104 mg/dL — ABNORMAL HIGH (ref 65–99)
POTASSIUM: 4.8 mmol/L (ref 3.5–5.1)
Sodium: 132 mmol/L — ABNORMAL LOW (ref 135–145)
TOTAL PROTEIN: 6.3 g/dL — AB (ref 6.5–8.1)
Total Bilirubin: 1 mg/dL (ref 0.3–1.2)

## 2015-11-10 LAB — I-STAT CHEM 8, ED
BUN: 20 mg/dL (ref 6–20)
Calcium, Ion: 1.16 mmol/L (ref 1.13–1.30)
Chloride: 95 mmol/L — ABNORMAL LOW (ref 101–111)
Creatinine, Ser: 1.1 mg/dL (ref 0.61–1.24)
Glucose, Bld: 98 mg/dL (ref 65–99)
HEMATOCRIT: 34 % — AB (ref 39.0–52.0)
HEMOGLOBIN: 11.6 g/dL — AB (ref 13.0–17.0)
POTASSIUM: 4.6 mmol/L (ref 3.5–5.1)
SODIUM: 133 mmol/L — AB (ref 135–145)
TCO2: 27 mmol/L (ref 0–100)

## 2015-11-10 LAB — DIFFERENTIAL
BASOS ABS: 0 10*3/uL (ref 0.0–0.1)
BASOS PCT: 0 %
EOS ABS: 0.2 10*3/uL (ref 0.0–0.7)
EOS PCT: 3 %
Lymphocytes Relative: 26 %
Lymphs Abs: 1.8 10*3/uL (ref 0.7–4.0)
MONOS PCT: 9 %
Monocytes Absolute: 0.6 10*3/uL (ref 0.1–1.0)
NEUTROS PCT: 62 %
Neutro Abs: 4.1 10*3/uL (ref 1.7–7.7)

## 2015-11-10 LAB — I-STAT TROPONIN, ED: Troponin i, poc: 0.01 ng/mL (ref 0.00–0.08)

## 2015-11-10 LAB — URINALYSIS, ROUTINE W REFLEX MICROSCOPIC
Bilirubin Urine: NEGATIVE
Glucose, UA: NEGATIVE mg/dL
Hgb urine dipstick: NEGATIVE
Ketones, ur: NEGATIVE mg/dL
LEUKOCYTES UA: NEGATIVE
NITRITE: NEGATIVE
PH: 5.5 (ref 5.0–8.0)
Protein, ur: NEGATIVE mg/dL
SPECIFIC GRAVITY, URINE: 1.01 (ref 1.005–1.030)

## 2015-11-10 LAB — CBC
HCT: 33.5 % — ABNORMAL LOW (ref 39.0–52.0)
Hemoglobin: 11.9 g/dL — ABNORMAL LOW (ref 13.0–17.0)
MCH: 34.4 pg — AB (ref 26.0–34.0)
MCHC: 35.5 g/dL (ref 30.0–36.0)
MCV: 96.8 fL (ref 78.0–100.0)
Platelets: 167 10*3/uL (ref 150–400)
RBC: 3.46 MIL/uL — ABNORMAL LOW (ref 4.22–5.81)
RDW: 13.2 % (ref 11.5–15.5)
WBC: 6.7 10*3/uL (ref 4.0–10.5)

## 2015-11-10 LAB — PROTIME-INR
INR: 1.35 (ref 0.00–1.49)
Prothrombin Time: 16.8 seconds — ABNORMAL HIGH (ref 11.6–15.2)

## 2015-11-10 LAB — ETHANOL

## 2015-11-10 LAB — APTT: APTT: 37 s (ref 24–37)

## 2015-11-10 NOTE — ED Provider Notes (Signed)
CSN: [id number]     Arrival date & time 11/10/15  1225 History   First MD Initiated Contact with Patient 11/10/15 1231     Chief Complaint  Patient presents with  . Numbness     (Consider location/radiation/quality/duration/timing/severity/associated sxs/prior Treatment) HPI Comments: The pt is an 82 y/o male - he has a vague history of LUE and LLE numbness which is documented int he past to be possibly related to spinal stenosis - as far back as 2009 when he had MRI performed - but states that today his sx were much worsened with intense numbness of the arm and the Leg on the L - no weakness today though he states that in the past he has some chronic weakness of the L hand.  He is on blood thinners (xarelto) after having valve replacement.  He is unable to tell me when the sx got worse but it seems like it was this morning - he then tells me that he feels that he may back to normal.  There was no changes in vision, speech or balance and states that he has had no difficulty ambulating or eating today.  The history is provided by the patient.    Past Medical History  Diagnosis Date  . BPH (benign prostatic hypertrophy)   . Diverticulosis 2006  . Depression   . Anemia   . Spinal stenosis   . Lung nodules 2010  . Actinic keratosis, hx of   . Macular degeneration   . Hypertension   . Allergy     allergic rhinitis  . Hearing loss     wears bilateral hearing aids  . Inguinal hernia     left side -surgery planned  . GERD (gastroesophageal reflux disease)   . Arthritis     generalized-more back issues  . Severe aortic stenosis 04/16/2013  . Nodule of right lung 05/24/2013  . CHF (congestive heart failure) ([place])   . Shortness of breath   . Atrial flutter ([place])   . Chronic diastolic heart failure ([place]) 08/11/2013   Past Surgical History  Procedure Laterality Date  . Tonsillectomy  1933  . Appendectomy  1947  . Neck surgery  1997    compressed nerve  . Skin biopsy  2003-2011     multiple   . Cataract extraction, bilateral      bilateral  . Inguinal hernia repair Left 02/11/2013    Procedure: LEFT LAPAROSCOPIC REPAIR INGUINAL HERNIA WITH MESH;  Surgeon: Adin Hector, MD;  Location: [facility];  Service: General;  Laterality: Left;  . Insertion of mesh Left 02/11/2013    Procedure: INSERTION OF MESH;  Surgeon: Adin Hector, MD;  Location: [facility];  Service: General;  Laterality: Left;  . Hernia repair  02/24/13    lap LIH repair  . Aortic valve replacement N/A 06/04/2013    Procedure: AORTIC VALVE REPLACEMENT (AVR);  Surgeon: Grace Isaac, MD;  Location: [facility];  Service: Open Heart Surgery;  Laterality: N/A;  . Intraoperative transesophageal echocardiogram N/A 06/04/2013    Procedure: INTRAOPERATIVE TRANSESOPHAGEAL ECHOCARDIOGRAM;  Surgeon: Grace Isaac, MD;  Location: [facility];  Service: Open Heart Surgery;  Laterality: N/A;  . Coronary artery bypass graft N/A 06/04/2013    Procedure: CORONARY ARTERY BYPASS GRAFTING (CABG);  Surgeon: Grace Isaac, MD;  Location: [place];  Service: Open Heart Surgery;  Laterality: N/A;  . Wedge resection Right 06/04/2013    Procedure: WEDGE RESECTION Right Lower Lobe;  Surgeon: Grace Isaac,  MD;  Location[facility];  Service: Open Heart Surgery;  Laterality: Right;   Family History  Problem Relation Age of Onset  . Prostate cancer Father   . Cancer Mother     passed away age 69  . Cancer Father   . Cancer Brother   . Hypertension Brother    Social History  Substance Use Topics  . Smoking status: Former Smoker -- 1.50 packs/day for 20 years    Types: Cigarettes    Quit date: 10/29/1967  . Smokeless tobacco: Never Used  . Alcohol Use: Yes     Comment: occasionally    Review of Systems  All other systems reviewed and are negative.     Allergies  Review of patient's allergies indicates no known allergies.  Home Medications   Prior to Admission medications   Medication Sig Start Date End Date Taking?  Authorizing Provider  amoxicillin (AMOXIL) 500 MG tablet Take 500 mg by mouth as needed (Take 4 before dental appt).   Yes Historical Provider, MD  atorvastatin (LIPITOR) 20 MG tablet Take 1 tablet (20 mg total) by mouth daily at 6 PM. Patient taking differently: Take 20 mg by mouth 3 (three) times a week.  06/15/13  Yes Erin R Barrett, PA-C  ferrous sulfate 325 (65 FE) MG tablet Take 325 mg by mouth daily with breakfast.   Yes Historical Provider, MD  fluticasone (FLONASE) 50 MCG/ACT nasal spray Place 2 sprays into the nose daily as needed for allergies.    Yes Historical Provider, MD  loratadine (CLARITIN) 10 MG tablet Take 10 mg by mouth daily as needed for allergies.    Yes Historical Provider, MD  oxybutynin (DITROPAN XL) 15 MG 24 hr tablet Take 15 mg by mouth at bedtime.   Yes Historical Provider, MD  potassium chloride SA (K-DUR,KLOR-CON) 20 MEQ tablet Take 1 tablet (20 mEq total) by mouth daily. 01/11/14  Yes Jerline Pain, MD  Rivaroxaban (XARELTO) 15 MG TABS tablet Take 1 tablet (15 mg total) by mouth daily with supper. 10/04/15  Yes Evans Lance, MD  amLODipine (NORVASC) 2.5 MG tablet Take 2.5 mg by mouth daily.    Historical Provider, MD  folic acid (FOLVITE) 1 MG tablet Take 1 tablet (1 mg total) by mouth daily. 06/15/13   Erin R Barrett, PA-C  furosemide (LASIX) 40 MG tablet Take 20 mg by mouth as needed for fluid or edema (Patient takes 1/2 pill).     Historical Provider, MD  Multiple Vitamin (MULTIVITAMIN WITH MINERALS) TABS Take 1 tablet by mouth daily.    Historical Provider, MD  Multiple Vitamins-Minerals (EYE VITAMINS PO) Take 1 tablet by mouth daily. Perservision Vitamin    Historical Provider, MD  omeprazole (PRILOSEC) 20 MG capsule Take 20 mg by mouth daily.    Historical Provider, MD  tamsulosin (FLOMAX) 0.4 MG CAPS capsule Take 0.4 mg by mouth daily.     Historical Provider, MD  traMADol (ULTRAM) 50 MG tablet Take by mouth every 6 (six) hours as needed for moderate pain.     Historical Provider, MD   BP 141/66 mmHg  Pulse 56  Temp(Src) 98.4 F (36.9 C) (Oral)  Resp 13  SpO2 100% Physical Exam  Constitutional: He appears well-developed and well-nourished. No distress.  HENT:  Head: Normocephalic and atraumatic.  Mouth/Throat: Oropharynx is clear and moist. No oropharyngeal exudate.  Eyes: Conjunctivae and EOM are normal. Pupils are equal, round, and reactive to light. Right eye exhibits no discharge. Left eye exhibits  no discharge. No scleral icterus.  Neck: Normal range of motion. Neck supple. No JVD present. No thyromegaly present.  Cardiovascular: Normal rate, regular rhythm and intact distal pulses.  Exam reveals no gallop and no friction rub.   Murmur ( soft) heard. Pulmonary/Chest: Effort normal and breath sounds normal. No respiratory distress. He has no wheezes. He has no rales.  Abdominal: Soft. Bowel sounds are normal. He exhibits no distension and no mass. There is no tenderness.  Musculoskeletal: Normal range of motion. He exhibits no edema or tenderness.  Compartments soft - joints supple, normal ROM of all joints.  Lymphadenopathy:    He has no cervical adenopathy.  Neurological: He is alert. Coordination normal.  Neurologic exam:  Speech clear, pupils equal round reactive to light, extraocular movements intact  Normal peripheral visual fields Cranial nerves III through XII normal including no facial droop Follows commands, moves all extremities x4, normal strength to bilateral upper and lower extremities at all major muscle groups including grip Sensation decreased to light touch and pinprick on the LUE and LLE Coordination intact, no limb ataxia, finger-nose-finger normal Rapid alternating movements normal No pronator drift Gait normal   Skin: Skin is warm and dry. No rash noted. No erythema.  Psychiatric: He has a normal mood and affect. His behavior is normal.  Nursing note and vitals reviewed.   ED Course  Procedures (including  critical care time) Labs Review Labs Reviewed  PROTIME-INR - Abnormal; Notable for the following:    Prothrombin Time 16.8 (*)    All other components within normal limits  CBC - Abnormal; Notable for the following:    RBC 3.46 (*)    Hemoglobin 11.9 (*)    HCT 33.5 (*)    MCH 34.4 (*)    All other components within normal limits  COMPREHENSIVE METABOLIC PANEL - Abnormal; Notable for the following:    Sodium 132 (*)    Chloride 99 (*)    Glucose, Bld 104 (*)    Calcium 8.6 (*)    Total Protein 6.3 (*)    ALT 16 (*)    GFR calc non Af Amer 57 (*)    All other components within normal limits  I-STAT CHEM 8, ED - Abnormal; Notable for the following:    Sodium 133 (*)    Chloride 95 (*)    Hemoglobin 11.6 (*)    HCT 34.0 (*)    All other components within normal limits  ETHANOL  APTT  DIFFERENTIAL  URINE RAPID DRUG SCREEN, HOSP PERFORMED  URINALYSIS, ROUTINE W REFLEX MICROSCOPIC (NOT AT [facility])  Randolm Idol, ED    Imaging Review Mr Brain Wo Contrast  11/10/2015  CLINICAL DATA:  New onset left-sided weakness and numbness. EXAM: MRI HEAD WITHOUT CONTRAST TECHNIQUE: Multiplanar, multiecho pulse sequences of the brain and surrounding structures were obtained without intravenous contrast. COMPARISON:  None. FINDINGS: Moderate periventricular and subcortical white matter changes are present bilaterally. No acute infarct, hemorrhage, or mass lesion is present. A remote lacunar infarct is present in the right cerebellum. No significant extra-axial fluid collection is present. Ventricles are proportionate to the degree of atrophy. Internal auditory canals are within normal limits. Flow is present in the major intracranial arteries. Bilateral lens replacements are present. The globes and orbits are otherwise intact. Mild mucosal thickening is present in the maxillary sinuses bilaterally. A heterogeneous polyp is present along the medial aspect of the left maxillary sinus. IMPRESSION: 1. No  acute intracranial abnormality. 2. Moderate periventricular and  subcortical white matter disease bilaterally. This likely reflects the sequela of chronic microvascular ischemia. 3. Moderate generalized atrophy. 4. 18 mm heterogeneous polyp in the medial left maxillary sinus. Mild mucosal disease otherwise. Electronically Signed   By: San Morelle M.D.   On: 11/10/2015 14:44   I have personally reviewed and evaluated these images and lab results as part of my medical decision-making.   EKG Interpretation   Date/Time:  Friday November 10 2015 12:57:44 EST Ventricular Rate:  61 PR Interval:  466 QRS Duration: 100 QT Interval:  414 QTC Calculation: 417 R Axis:   -59 Text Interpretation:  Sinus rhythm Prolonged PR interval Left anterior  fascicular block Minimal ST depression, lateral leads Baseline wander in  lead(s) III V6 since last tracing no significant change Confirmed by  Sula Fetterly  MD, Brysin Towery ([id number]) on 11/10/2015 1:35:23 PM      MDM   Final diagnoses:  Numbness and tingling in left arm    Consider stroke / stenosis, pt is overall well appearing and appears to be at baseline with his neuro defecit - he absolutely can not pin down when this got worse -but has been present for > 3 months the way he describes his sx.    MRI neg - d/w family and patient - appears very stable at this time, no decompensation - no MRI findings to suggest CVA.    Filed Vitals:   11/10/15 1238 11/10/15 1300 11/10/15 1330  BP: 156/96 136/69 141/66  Pulse: 66 66 56  Temp: 98.4 F (36.9 C)    TempSrc: Oral    Resp: 18 18 13   SpO2: 96% 100% 100%     Noemi Chapel, MD 11/10/15 1511

## 2015-11-10 NOTE — ED Notes (Addendum)
Patient from home. C/o left sided numbness that has been occurring for a while, but had worsened today, first in leg then now to arm. C/o difficulty with speech as well that it is hard to get his words out sometimes. Patient alert, oriented. Left grip slightly weaker than right.   Dr Sabra Heck at bedside.

## 2015-11-10 NOTE — Discharge Instructions (Signed)

## 2015-11-16 ENCOUNTER — Emergency Department (HOSPITAL_COMMUNITY)
Admission: EM | Admit: 2015-11-16 | Discharge: 2015-11-16 | Disposition: A | Discharge status: Home / Self Care | Payer: Medicare Other | Attending: Emergency Medicine | Admitting: Emergency Medicine

## 2015-11-16 ENCOUNTER — Encounter (HOSPITAL_COMMUNITY): Payer: Self-pay | Admitting: Family Medicine

## 2015-11-16 DIAGNOSIS — Z87891 Personal history of nicotine dependence: Secondary | ICD-10-CM | POA: Diagnosis not present

## 2015-11-16 DIAGNOSIS — I10 Essential (primary) hypertension: Secondary | ICD-10-CM | POA: Insufficient documentation

## 2015-11-16 DIAGNOSIS — R6889 Other general symptoms and signs: Secondary | ICD-10-CM

## 2015-11-16 DIAGNOSIS — R4182 Altered mental status, unspecified: Secondary | ICD-10-CM | POA: Insufficient documentation

## 2015-11-16 DIAGNOSIS — G501 Atypical facial pain: Secondary | ICD-10-CM | POA: Diagnosis not present

## 2015-11-16 DIAGNOSIS — R0789 Other chest pain: Secondary | ICD-10-CM | POA: Diagnosis not present

## 2015-11-16 DIAGNOSIS — H9190 Unspecified hearing loss, unspecified ear: Secondary | ICD-10-CM | POA: Insufficient documentation

## 2015-11-16 DIAGNOSIS — D649 Anemia, unspecified: Secondary | ICD-10-CM | POA: Diagnosis not present

## 2015-11-16 DIAGNOSIS — R0602 Shortness of breath: Secondary | ICD-10-CM | POA: Insufficient documentation

## 2015-11-16 DIAGNOSIS — M1990 Unspecified osteoarthritis, unspecified site: Secondary | ICD-10-CM | POA: Diagnosis not present

## 2015-11-16 DIAGNOSIS — I5032 Chronic diastolic (congestive) heart failure: Secondary | ICD-10-CM | POA: Diagnosis not present

## 2015-11-16 DIAGNOSIS — R079 Chest pain, unspecified: Secondary | ICD-10-CM | POA: Diagnosis not present

## 2015-11-16 DIAGNOSIS — R200 Anesthesia of skin: Secondary | ICD-10-CM | POA: Diagnosis not present

## 2015-11-16 DIAGNOSIS — Z79899 Other long term (current) drug therapy: Secondary | ICD-10-CM | POA: Diagnosis not present

## 2015-11-16 DIAGNOSIS — R51 Headache: Secondary | ICD-10-CM | POA: Insufficient documentation

## 2015-11-16 DIAGNOSIS — N400 Enlarged prostate without lower urinary tract symptoms: Secondary | ICD-10-CM | POA: Insufficient documentation

## 2015-11-16 DIAGNOSIS — Z872 Personal history of diseases of the skin and subcutaneous tissue: Secondary | ICD-10-CM | POA: Insufficient documentation

## 2015-11-16 DIAGNOSIS — K219 Gastro-esophageal reflux disease without esophagitis: Secondary | ICD-10-CM | POA: Insufficient documentation

## 2015-11-16 DIAGNOSIS — Z7901 Long term (current) use of anticoagulants: Secondary | ICD-10-CM | POA: Diagnosis not present

## 2015-11-16 DIAGNOSIS — R448 Other symptoms and signs involving general sensations and perceptions: Secondary | ICD-10-CM

## 2015-11-16 DIAGNOSIS — Z8659 Personal history of other mental and behavioral disorders: Secondary | ICD-10-CM | POA: Insufficient documentation

## 2015-11-16 DIAGNOSIS — Z951 Presence of aortocoronary bypass graft: Secondary | ICD-10-CM | POA: Insufficient documentation

## 2015-11-16 LAB — COMPREHENSIVE METABOLIC PANEL
ALBUMIN: 4 g/dL (ref 3.5–5.0)
ALT: 16 U/L — AB (ref 17–63)
AST: 20 U/L (ref 15–41)
Alkaline Phosphatase: 62 U/L (ref 38–126)
Anion gap: 7 (ref 5–15)
BUN: 13 mg/dL (ref 6–20)
CHLORIDE: 98 mmol/L — AB (ref 101–111)
CO2: 27 mmol/L (ref 22–32)
CREATININE: 1.11 mg/dL (ref 0.61–1.24)
Calcium: 8.9 mg/dL (ref 8.9–10.3)
GFR calc Af Amer: >60 mL/min (ref >60)
GFR calc non Af Amer: 57 mL/min — ABNORMAL LOW (ref >60)
GLUCOSE: 101 mg/dL — AB (ref 65–99)
POTASSIUM: 5.3 mmol/L — AB (ref 3.5–5.1)
SODIUM: 132 mmol/L — AB (ref 135–145)
Total Bilirubin: 0.8 mg/dL (ref 0.3–1.2)
Total Protein: 6.6 g/dL (ref 6.5–8.1)

## 2015-11-16 LAB — DIFFERENTIAL
BASOS ABS: 0 10*3/uL (ref 0.0–0.1)
BASOS PCT: 0 %
EOS ABS: 0.1 10*3/uL (ref 0.0–0.7)
Eosinophils Relative: 2 %
Lymphocytes Relative: 23 %
Lymphs Abs: 1.8 10*3/uL (ref 0.7–4.0)
Monocytes Absolute: 0.8 10*3/uL (ref 0.1–1.0)
Monocytes Relative: 10 %
NEUTROS ABS: 5.2 10*3/uL (ref 1.7–7.7)
NEUTROS PCT: 65 %

## 2015-11-16 LAB — I-STAT TROPONIN, ED
TROPONIN I, POC: 0.02 ng/mL (ref 0.00–0.08)
TROPONIN I, POC: 0 ng/mL (ref 0.00–0.08)

## 2015-11-16 LAB — BASIC METABOLIC PANEL
ANION GAP: 6 (ref 5–15)
BUN: 14 mg/dL (ref 6–20)
CALCIUM: 8.7 mg/dL — AB (ref 8.9–10.3)
CHLORIDE: 100 mmol/L — AB (ref 101–111)
CO2: 26 mmol/L (ref 22–32)
Creatinine, Ser: 1.07 mg/dL (ref 0.61–1.24)
GFR calc Af Amer: >60 mL/min (ref >60)
GFR calc non Af Amer: 59 mL/min — ABNORMAL LOW (ref >60)
GLUCOSE: 117 mg/dL — AB (ref 65–99)
Potassium: 4.2 mmol/L (ref 3.5–5.1)
Sodium: 132 mmol/L — ABNORMAL LOW (ref 135–145)

## 2015-11-16 LAB — CBC
HCT: 35 % — ABNORMAL LOW (ref 39.0–52.0)
Hemoglobin: 12.2 g/dL — ABNORMAL LOW (ref 13.0–17.0)
MCH: 33.6 pg (ref 26.0–34.0)
MCHC: 34.9 g/dL (ref 30.0–36.0)
MCV: 96.4 fL (ref 78.0–100.0)
Platelets: 157 10*3/uL (ref 150–400)
RBC: 3.63 MIL/uL — AB (ref 4.22–5.81)
RDW: 13 % (ref 11.5–15.5)
WBC: 7.9 10*3/uL (ref 4.0–10.5)

## 2015-11-16 LAB — I-STAT CHEM 8, ED
BUN: 17 mg/dL (ref 6–20)
CALCIUM ION: 1.16 mmol/L (ref 1.13–1.30)
CREATININE: 1 mg/dL (ref 0.61–1.24)
Chloride: 94 mmol/L — ABNORMAL LOW (ref 101–111)
GLUCOSE: 89 mg/dL (ref 65–99)
HEMATOCRIT: 39 % (ref 39.0–52.0)
HEMOGLOBIN: 13.3 g/dL (ref 13.0–17.0)
POTASSIUM: 4.9 mmol/L (ref 3.5–5.1)
SODIUM: 131 mmol/L — AB (ref 135–145)
TCO2: 26 mmol/L (ref 0–100)

## 2015-11-16 LAB — PHOSPHORUS: Phosphorus: 3.1 mg/dL (ref 2.5–4.6)

## 2015-11-16 LAB — APTT: aPTT: 38 seconds — ABNORMAL HIGH (ref 24–37)

## 2015-11-16 LAB — CBG MONITORING, ED: Glucose-Capillary: 127 mg/dL — ABNORMAL HIGH (ref 65–99)

## 2015-11-16 LAB — PROTIME-INR
INR: 1.43 (ref 0.00–1.49)
PROTHROMBIN TIME: 17.5 s — AB (ref 11.6–15.2)

## 2015-11-16 LAB — MAGNESIUM: Magnesium: 2.2 mg/dL (ref 1.7–2.4)

## 2015-11-16 NOTE — ED Notes (Signed)
Pt able to dress and ambulate independently 

## 2015-11-16 NOTE — ED Provider Notes (Signed)
CSN: [id number]     Arrival date & time 11/16/15  1310 History   First MD Initiated Contact with Patient 11/16/15 1734     Chief Complaint  Patient presents with  . Chest Pain  . Altered Mental Status     (Consider location/radiation/quality/duration/timing/severity/associated sxs/prior Treatment) HPI   82 y M w PMH depression, anemia, chf, aortic stenosis, who comes in with multiple complaints of L arm numbness, intermittent sob, intermittent "facial pressure."  All of these complaints have been going on for approx a couple of months.  Patient has been seen mult times both at the ED and as an outpatient.  Patient had a neg exercise stress test this past fall, he has also had an oupatient holter this past fall.  Patient was seen in the ED last week where MR brain was neg.  Patient has continued to have these sx which his daughter who accompanies him thinks might be panic attacks.  His daughter notes that his sx usually seem to come on when he is thinking about the fact that him and his wife are about to move into assisted living.  When he thinking of this his daughter notes that he complains of arm numbness and facial pressure that he describes as his "head feeling full."  He has had no fever, no falls.  No recent illnesses.    Past Medical History  Diagnosis Date  . BPH (benign prostatic hypertrophy)   . Diverticulosis 2006  . Depression   . Anemia   . Spinal stenosis   . Lung nodules 2010  . Actinic keratosis, hx of   . Macular degeneration   . Hypertension   . Allergy     allergic rhinitis  . Hearing loss     wears bilateral hearing aids  . Inguinal hernia     left side -surgery planned  . GERD (gastroesophageal reflux disease)   . Arthritis     generalized-more back issues  . Severe aortic stenosis 04/16/2013  . Nodule of right lung 05/24/2013  . CHF (congestive heart failure) ([place])   . Shortness of breath   . Atrial flutter ([place])   . Chronic diastolic heart failure ([place])  08/11/2013   Past Surgical History  Procedure Laterality Date  . Tonsillectomy  1933  . Appendectomy  1947  . Neck surgery  1997    compressed nerve  . Skin biopsy  2003-2011    multiple   . Cataract extraction, bilateral      bilateral  . Inguinal hernia repair Left 02/11/2013    Procedure: LEFT LAPAROSCOPIC REPAIR INGUINAL HERNIA WITH MESH;  Surgeon: Adin Hector, MD;  [facility];  Service: General;  Laterality: Left;  . Insertion of mesh Left 02/11/2013    Procedure: INSERTION OF MESH;  Surgeon: Adin Hector, MD;  [facility];  Service: General;  Laterality: Left;  . Hernia repair  02/24/13    lap LIH repair  . Aortic valve replacement N/A 06/04/2013    Procedure: AORTIC VALVE REPLACEMENT (AVR);  Surgeon: Grace Isaac, MD;  Location: [facility];  Service: Open Heart Surgery;  Laterality: N/A;  . Intraoperative transesophageal echocardiogram N/A 06/04/2013    Procedure: INTRAOPERATIVE TRANSESOPHAGEAL ECHOCARDIOGRAM;  Surgeon: Grace Isaac, MD;  Location: [facility];  Service: Open Heart Surgery;  Laterality: N/A;  . Coronary artery bypass graft N/A 06/04/2013    Procedure: CORONARY ARTERY BYPASS GRAFTING (CABG);  Surgeon: Grace Isaac, MD;  Location: [place];  Service:  Open Heart Surgery;  Laterality: N/A;  . Wedge resection Right 06/04/2013    Procedure: WEDGE RESECTION Right Lower Lobe;  Surgeon: Grace Isaac, MD;  Location: [facility];  Service: Open Heart Surgery;  Laterality: Right;   Family History  Problem Relation Age of Onset  . Prostate cancer Father   . Cancer Mother     passed away age 46  . Cancer Father   . Cancer Brother   . Hypertension Brother    Social History  Substance Use Topics  . Smoking status: Former Smoker -- 1.50 packs/day for 20 years    Types: Cigarettes    Quit date: 10/29/1967  . Smokeless tobacco: Never Used  . Alcohol Use: Yes     Comment: occasionally    Review of Systems  Constitutional: Negative for fever and chills.   Eyes: Negative for redness.  Respiratory: Positive for shortness of breath. Negative for cough.   Cardiovascular: Negative for chest pain.  Gastrointestinal: Negative for nausea, vomiting, abdominal pain and diarrhea.  Genitourinary: Negative for dysuria.  Skin: Negative for rash.  Neurological: Positive for headaches.  All other systems reviewed and are negative.     Allergies  Review of patient's allergies indicates no known allergies.  Home Medications   Prior to Admission medications   Medication Sig Start Date End Date Taking? Authorizing Provider  amLODipine (NORVASC) 2.5 MG tablet Take 2.5 mg by mouth daily.    Historical Provider, MD  amoxicillin (AMOXIL) 500 MG tablet Take 500 mg by mouth as needed (Take 4 before dental appt).    Historical Provider, MD  atorvastatin (LIPITOR) 20 MG tablet Take 1 tablet (20 mg total) by mouth daily at 6 PM. Patient taking differently: Take 20 mg by mouth 3 (three) times a week.  06/15/13   Erin R Barrett, PA-C  ferrous sulfate 325 (65 FE) MG tablet Take 325 mg by mouth daily with breakfast.    Historical Provider, MD  fluticasone (FLONASE) 50 MCG/ACT nasal spray Place 2 sprays into the nose daily as needed for allergies.     Historical Provider, MD  folic acid (FOLVITE) 1 MG tablet Take 1 tablet (1 mg total) by mouth daily. 06/15/13   Erin R Barrett, PA-C  furosemide (LASIX) 40 MG tablet Take 20 mg by mouth as needed for fluid or edema (Patient takes 1/2 pill).     Historical Provider, MD  loratadine (CLARITIN) 10 MG tablet Take 10 mg by mouth daily as needed for allergies.     Historical Provider, MD  Multiple Vitamin (MULTIVITAMIN WITH MINERALS) TABS Take 1 tablet by mouth daily.    Historical Provider, MD  Multiple Vitamins-Minerals (EYE VITAMINS PO) Take 1 tablet by mouth daily. Perservision Vitamin    Historical Provider, MD  omeprazole (PRILOSEC) 20 MG capsule Take 20 mg by mouth daily.    Historical Provider, MD  oxybutynin (DITROPAN  XL) 15 MG 24 hr tablet Take 15 mg by mouth at bedtime.    Historical Provider, MD  potassium chloride SA (K-DUR,KLOR-CON) 20 MEQ tablet Take 1 tablet (20 mEq total) by mouth daily. 01/11/14   Jerline Pain, MD  Rivaroxaban (XARELTO) 15 MG TABS tablet Take 1 tablet (15 mg total) by mouth daily with supper. 10/04/15   Evans Lance, MD  tamsulosin (FLOMAX) 0.4 MG CAPS capsule Take 0.4 mg by mouth daily.     Historical Provider, MD  traMADol (ULTRAM) 50 MG tablet Take by mouth every 6 (six) hours as needed for  moderate pain.    Historical Provider, MD   BP 151/75 mmHg  Pulse 59  Temp(Src) 97.7 F (36.5 C) (Oral)  Resp 14  SpO2 97% Physical Exam  Constitutional: He is oriented to person, place, and time. No distress.  HENT:  Head: Normocephalic and atraumatic.  Eyes: EOM are normal. Pupils are equal, round, and reactive to light.  Neck: Normal range of motion. Neck supple.  Cardiovascular: Intact distal pulses.   Pulmonary/Chest: Effort normal and breath sounds normal. No respiratory distress.  Abdominal: Soft. There is no tenderness.  Musculoskeletal: Normal range of motion.  Neurological: He is alert and oriented to person, place, and time. He has normal strength. No cranial nerve deficit or sensory deficit. Coordination normal. GCS eye subscore is 4. GCS verbal subscore is 5. GCS motor subscore is 6.  Skin: No rash noted. He is not diaphoretic.  Psychiatric: He has a normal mood and affect.    ED Course  Procedures (including critical care time) Labs Review Labs Reviewed  PROTIME-INR - Abnormal; Notable for the following:    Prothrombin Time 17.5 (*)    All other components within normal limits  APTT - Abnormal; Notable for the following:    aPTT 38 (*)    All other components within normal limits  CBC - Abnormal; Notable for the following:    RBC 3.63 (*)    Hemoglobin 12.2 (*)    HCT 35.0 (*)    All other components within normal limits  COMPREHENSIVE METABOLIC PANEL -  Abnormal; Notable for the following:    Sodium 132 (*)    Potassium 5.3 (*)    Chloride 98 (*)    Glucose, Bld 101 (*)    ALT 16 (*)    GFR calc non Af Amer 57 (*)    All other components within normal limits  BASIC METABOLIC PANEL - Abnormal; Notable for the following:    Sodium 132 (*)    Chloride 100 (*)    Glucose, Bld 117 (*)    Calcium 8.7 (*)    GFR calc non Af Amer 59 (*)    All other components within normal limits  CBG MONITORING, ED - Abnormal; Notable for the following:    Glucose-Capillary 127 (*)    All other components within normal limits  I-STAT CHEM 8, ED - Abnormal; Notable for the following:    Sodium 131 (*)    Chloride 94 (*)    All other components within normal limits  DIFFERENTIAL  MAGNESIUM  PHOSPHORUS  I-STAT TROPOININ, ED  I-STAT TROPOININ, ED    Imaging Review No results found. I have personally reviewed and evaluated these images and lab results as part of my medical decision-making.   EKG Interpretation   Date/Time:  Thursday November 16 2015 17:53:52 EST Ventricular Rate:  66 PR Interval:  402 QRS Duration: 146 QT Interval:  430 QTC Calculation: 450 R Axis:   87 Text Interpretation:  Atrial fibrillation Right bundle branch block ED  PHYSICIAN INTERPRETATION AVAILABLE IN [facility] Confirmed by TEST,  Record ([id number]) on 11/17/2015 6:42:07 AM      MDM   Final diagnoses:  Chest pain, unspecified chest pain type  Facial pressure  Left arm numbness    55 y M w PMH depression, anemia, chf, aortic stenosis, who comes in with multiple complaints of L arm numbness, intermittent sob, intermittent "facial pressure."   On exam, patient has intact sensation and strength in bilateral upper extremities.   Doubt  sig spinal cord compression.  Patient was seen last week and had normal MR brain, doubt stroke.  As far as the intermittent facial pressure, patient had MR brain last week, doubt new mass  Lesion in that time given no falls since  then.  No fever/chills/nasal discharge.  Patient has no temporal ttp, doubt temporal arteritis.  As far as the intermittent sob, patient had neg EST this last fall, also holter this past fall.  Patients ekg does show 1st degree AV block and he has mild bradycardia but not so much that he should be symptomatic from the bradycardia.  He isn't sob at this time and has normal bilateral breath sounds, doubt PTX, doubt PNA given no cough, no fever, no leukocytosis.  Doubt PE given sats of 100%, patient not tachycardic, and pt already on xarelto.  Screening trop neg, doubt acs given no chest pain.  Have considered electrolyte abnormality.  Initial cmp with elevated K but on repeat K normal.  At this point, unsure of the etiology for these sx but given the length of time all of them have been going on, and multiple evaluations without etiology feel that he is stable enough to warrant continued outpatient f/u  I have discussed the results, Dx and Tx plan with the pt. They expressed understanding and agree with the plan and were told to return to ED with any worsening of condition or concern.    Disposition: Discharge  Condition: Good  Discharge Medication List as of 11/16/2015  7:48 PM      Follow Up: Wenda Low, MD [address] [place] [place] [address] [phone]  In 2 days    Pt seen in conjunction with Dr. Shelba Flake, MD 11/17/15 [place], MD 11/20/15 1145

## 2015-11-16 NOTE — ED Notes (Signed)
Patient decided to stay instead of leaving AMA

## 2015-11-16 NOTE — Discharge Instructions (Signed)

## 2015-11-16 NOTE — ED Notes (Signed)
MD at bedside. 

## 2015-11-16 NOTE — ED Notes (Signed)
Patient came to the desk and stated "I'm Leaving."

## 2015-11-16 NOTE — ED Notes (Signed)
Per family pt here for SOB, numbness, confusion, anxiety and pain all over. Pt A&Ox3. sts he feels full in his head.

## 2015-11-17 DIAGNOSIS — F419 Anxiety disorder, unspecified: Secondary | ICD-10-CM | POA: Diagnosis not present

## 2015-12-11 ENCOUNTER — Ambulatory Visit (INDEPENDENT_AMBULATORY_CARE_PROVIDER_SITE_OTHER): Payer: Medicare Other | Admitting: Internal Medicine

## 2015-12-11 ENCOUNTER — Encounter: Payer: Self-pay | Admitting: Internal Medicine

## 2015-12-11 VITALS — BP 118/64 | HR 68 | Ht 68.0 in | Wt 156.0 lb

## 2015-12-11 DIAGNOSIS — I495 Sick sinus syndrome: Secondary | ICD-10-CM | POA: Diagnosis not present

## 2015-12-11 DIAGNOSIS — I484 Atypical atrial flutter: Secondary | ICD-10-CM

## 2015-12-11 MED ORDER — APIXABAN 2.5 MG PO TABS: 2.5000 mg | ORAL_TABLET | Freq: Two times a day (BID) | ORAL | Status: DC

## 2015-12-11 NOTE — Progress Notes (Signed)
HPI Mr. Filice returns today for ongoing followup of atrial flutter and bradycardia. He is a pleasant 82 yo man with prior AVR (tissue valve), CAD, s/p CABG, who has had documented bradycardia. He has never had syncope. He was found to have atypical atrial flutter several months ago in clinic. He was asymptomatic. He has never had syncope or unexplained falls. He wore a cardiac monitor which demonstrated no symptomatic bradycardia. No other complaints. He does not have palpitations. He has become more anxious since considering moving to [facility]. He ultimately decided not to move. He has been to the ED with a multitude of complaints and has thought to be anxious and appears to be a bit better with Benzo's. No Known Allergies   Current Outpatient Prescriptions  Medication Sig Dispense Refill  . ALPRAZolam (XANAX) 0.25 MG tablet Take 0.25 mg by mouth 2 (two) times daily. 1/2 TABLET TWICE DAILY    . amLODipine (NORVASC) 2.5 MG tablet Take 2.5 mg by mouth daily.    Marland Kitchen atorvastatin (LIPITOR) 20 MG tablet Take 1 tablet (20 mg total) by mouth daily at 6 PM. (Patient taking differently: Take 20 mg by mouth 3 (three) times a week. )    . ferrous sulfate 325 (65 FE) MG tablet Take 325 mg by mouth daily with breakfast.    . fluticasone (FLONASE) 50 MCG/ACT nasal spray Place 2 sprays into the nose daily as needed for allergies.     . folic acid (FOLVITE) 1 MG tablet Take 1 tablet (1 mg total) by mouth daily.    . furosemide (LASIX) 40 MG tablet Take 20 mg by mouth as needed for fluid or edema (Patient takes 1/2 pill).     Marland Kitchen loratadine (CLARITIN) 10 MG tablet Take 10 mg by mouth daily as needed for allergies.     . Multiple Vitamin (MULTIVITAMIN WITH MINERALS) TABS Take 1 tablet by mouth daily.    . Multiple Vitamins-Minerals (EYE VITAMINS PO) Take 1 tablet by mouth daily. Perservision Vitamin    . omeprazole (PRILOSEC) 20 MG capsule Take 20 mg by mouth daily.    Marland Kitchen oxybutynin (DITROPAN XL) 15 MG 24  hr tablet Take 15 mg by mouth at bedtime.    . potassium chloride SA (K-DUR,KLOR-CON) 20 MEQ tablet Take 1 tablet (20 mEq total) by mouth daily. 30 tablet 12  . Rivaroxaban (XARELTO) 15 MG TABS tablet Take 1 tablet (15 mg total) by mouth daily with supper. 30 tablet 3  . tamsulosin (FLOMAX) 0.4 MG CAPS capsule Take 0.4 mg by mouth daily.     . traMADol (ULTRAM) 50 MG tablet Take by mouth every 6 (six) hours as needed for moderate pain. Reported on 12/11/2015     No current facility-administered medications for this visit.     Past Medical History  Diagnosis Date  . BPH (benign prostatic hypertrophy)   . Diverticulosis 2006  . Depression   . Anemia   . Spinal stenosis   . Lung nodules 2010  . Actinic keratosis, hx of   . Macular degeneration   . Hypertension   . Allergy     allergic rhinitis  . Hearing loss     wears bilateral hearing aids  . Inguinal hernia     left side -surgery planned  . GERD (gastroesophageal reflux disease)   . Arthritis     generalized-more back issues  . Severe aortic stenosis 04/16/2013  . Nodule of right lung 05/24/2013  . CHF (congestive  heart failure) ([place])   . Shortness of breath   . Atrial flutter ([place])   . Chronic diastolic heart failure ([place]) 08/11/2013    ROS:   All systems reviewed and negative except as noted in the HPI.   Past Surgical History  Procedure Laterality Date  . Tonsillectomy  1933  . Appendectomy  1947  . Neck surgery  1997    compressed nerve  . Skin biopsy  2003-2011    multiple   . Cataract extraction, bilateral      bilateral  . Inguinal hernia repair Left 02/11/2013    Procedure: LEFT LAPAROSCOPIC REPAIR INGUINAL HERNIA WITH MESH;  Surgeon: Adin Hector, MD;  Location: [facility];  Service: General;  Laterality: Left;  . Insertion of mesh Left 02/11/2013    Procedure: INSERTION OF MESH;  Surgeon: Adin Hector, MD;  Location: [facility];  Service: General;  Laterality: Left;  . Hernia repair  02/24/13    lap LIH  repair  . Aortic valve replacement N/A 06/04/2013    Procedure: AORTIC VALVE REPLACEMENT (AVR);  Surgeon: Grace Isaac, MD;  Location: [facility];  Service: Open Heart Surgery;  Laterality: N/A;  . Intraoperative transesophageal echocardiogram N/A 06/04/2013    Procedure: INTRAOPERATIVE TRANSESOPHAGEAL ECHOCARDIOGRAM;  Surgeon: Grace Isaac, MD;  Location: [facility];  Service: Open Heart Surgery;  Laterality: N/A;  . Coronary artery bypass graft N/A 06/04/2013    Procedure: CORONARY ARTERY BYPASS GRAFTING (CABG);  Surgeon: Grace Isaac, MD;  Location: [facility];  Service: Open Heart Surgery;  Laterality: N/A;  . Wedge resection Right 06/04/2013    Procedure: WEDGE RESECTION Right Lower Lobe;  Surgeon: Grace Isaac, MD;  Location: [facility];  Service: Open Heart Surgery;  Laterality: Right;     Family History  Problem Relation Age of Onset  . Prostate cancer Father   . Cancer Mother     passed away age 80  . Cancer Father   . Cancer Brother   . Hypertension Brother      Social History   Social History  . Marital Status: Married    Spouse Name: N/A  . Number of Children: 1  . Years of Education: N/A   Occupational History  . retired from [employer]    Social History Main Topics  . Smoking status: Former Smoker -- 1.50 packs/day for 20 years    Types: Cigarettes    Quit date: 10/29/1967  . Smokeless tobacco: Never Used  . Alcohol Use: 0.0 oz/week    0 Standard drinks or equivalent per week     Comment: occasionally  . Drug Use: No  . Sexual Activity: Not Currently   Other Topics Concern  . Not on file   Social History Narrative     BP 118/64 mmHg  Pulse 68  Ht 5\' 8"  (1.727 m)  Wt 156 lb (70.761 kg)  BMI 23.73 kg/m2  SpO2 94%  Physical Exam:  Elderly but Well appearing 82 yo man, NAD HEENT: Unremarkable Neck:  7 cm JVD, no thyromegally Lymphatics:  No adenopathy Back:  No CVA tenderness Lungs:  Clear with no wheezes HEART:  Regular rate rhythm, no murmurs, no  rubs, no clicks Abd:  soft, positive bowel sounds, no organomegally, no rebound, no guarding Ext:  2 plus pulses, no edema, no cyanosis, no clubbing Skin:  No rashes no nodules Neuro:  CN II through XII intact, motor grossly intact  Assess/Plan: 1. Atypical atrial flutter - he is asymptomatic  and tolerating his atrial flutter and anti-coagulation. He is concerned that Xarelto is causing him some of his symptoms. i have offered him a chance to switch to Eliquis and he wishes to. 2. Bradycardia - he has sinus node dysfunction but is asymptomatic. He will undergo watchful waiting 3. HTN - his blood pressure is controlled. Will follow. 4. Anxiety - he has been stressed about the possibility of moving but appears better with the benzodiazipines.  Joseph Garner

## 2015-12-11 NOTE — Addendum Note (Signed)
Addended by: Levonne Hubert on: 12/11/2015 09:50 AM   Modules accepted: Orders, Medications

## 2015-12-11 NOTE — Patient Instructions (Addendum)
Your physician wants you to follow-up in: 6 months with Dr. Lovena Le. You will receive a reminder letter in the mail two months in advance. If you don't receive a letter, please call our office to schedule the follow-up appointment.  Your physician recommends that you continue on your current medications as directed. Please refer to the Current Medication list given to you today.  Start Eliquis 2.5 mg Take One Tablet Two Times Daily   If you need a refill on your cardiac medications before your next appointment, please call your pharmacy.  Thank you for choosing [facility]! '

## 2015-12-13 ENCOUNTER — Telehealth: Payer: Self-pay | Admitting: Internal Medicine

## 2015-12-13 NOTE — Telephone Encounter (Signed)
Does pt need to hold Eliquis for teeth cleaning ?

## 2015-12-13 NOTE — Telephone Encounter (Signed)
Patient would like to ask about dental cleaning and his blood thinner. / tg

## 2015-12-14 NOTE — Telephone Encounter (Signed)
Spoke with patient and let him know he does not need to hold Eliquis for teeth cleaning

## 2016-01-04 ENCOUNTER — Encounter: Payer: Self-pay | Admitting: Cardiology

## 2016-01-04 ENCOUNTER — Ambulatory Visit (INDEPENDENT_AMBULATORY_CARE_PROVIDER_SITE_OTHER): Payer: Medicare Other | Admitting: Cardiology

## 2016-01-04 VITALS — BP 130/54 | HR 66 | Ht 68.0 in | Wt 158.8 lb

## 2016-01-04 DIAGNOSIS — I495 Sick sinus syndrome: Secondary | ICD-10-CM

## 2016-01-04 DIAGNOSIS — Z953 Presence of xenogenic heart valve: Secondary | ICD-10-CM

## 2016-01-04 DIAGNOSIS — Z951 Presence of aortocoronary bypass graft: Secondary | ICD-10-CM

## 2016-01-04 DIAGNOSIS — Z954 Presence of other heart-valve replacement: Secondary | ICD-10-CM

## 2016-01-04 DIAGNOSIS — I440 Atrioventricular block, first degree: Secondary | ICD-10-CM

## 2016-01-04 NOTE — Patient Instructions (Signed)

## 2016-01-04 NOTE — Progress Notes (Signed)
[facility]. [address] [place], [place]  [address] Phone: [phone] Fax:  [phone]  Date:  01/04/2016   ID:  Joseph Garner, DOB 1924/09/02, MRN [id number]  PCP:  Wenda Low, MD   History of Present Illness: Joseph Garner is a 82 y.o. male with bioprosthetic aortic valve replacement 06/04/13, CBAG with postoperative readmission to hospital secondary to acute diastolic heart failure, pneumonitis with cessation of amiodarone here for followup. He had prolonged stay at [facility] rehabilitation.  Tire easy. Still not having strength that he wishes he had. He has been to the emergency room a couple times with sensation of possible anxiety. Dr. Deforest Hoyles and given him some Xanax which he says has helped.  Had bypass x1, diagonal branch.   first-degree heart block noted. Appreciate Dr. Tanna Furry consultation. Does not record her pacemaker at this time.   Had issues with fleeting severe chest discomfort, lasted 1 second duration, electrical. Likely musculoskeletal.    Wt Readings from Last 3 Encounters:  01/04/16 158 lb 12.8 oz (72.031 kg)  12/11/15 156 lb (70.761 kg)  11/10/15 152 lb (68.947 kg)     Past Medical History  Diagnosis Date  . BPH (benign prostatic hypertrophy)   . Diverticulosis 2006  . Depression   . Anemia   . Spinal stenosis   . Lung nodules 2010  . Actinic keratosis, hx of   . Macular degeneration   . Hypertension   . Allergy     allergic rhinitis  . Hearing loss     wears bilateral hearing aids  . Inguinal hernia     left side -surgery planned  . GERD (gastroesophageal reflux disease)   . Arthritis     generalized-more back issues  . Severe aortic stenosis 04/16/2013  . Nodule of right lung 05/24/2013  . CHF (congestive heart failure) ([place])   . Shortness of breath   . Atrial flutter ([place])   . Chronic diastolic heart failure ([place]) 08/11/2013    Past Surgical History  Procedure Laterality Date  . Tonsillectomy  1933  . Appendectomy  1947  .  Neck surgery  1997    compressed nerve  . Skin biopsy  2003-2011    multiple   . Cataract extraction, bilateral      bilateral  . Inguinal hernia repair Left 02/11/2013    Procedure: LEFT LAPAROSCOPIC REPAIR INGUINAL HERNIA WITH MESH;  Surgeon: Adin Hector, MD;  [facility];  Service: General;  Laterality: Left;  . Insertion of mesh Left 02/11/2013    Procedure: INSERTION OF MESH;  Surgeon: Adin Hector, MD;  [facility];  Service: General;  Laterality: Left;  . Hernia repair  02/24/13    lap LIH repair  . Aortic valve replacement N/A 06/04/2013    Procedure: AORTIC VALVE REPLACEMENT (AVR);  Surgeon: Grace Isaac, MD;  Location: [facility];  Service: Open Heart Surgery;  Laterality: N/A;  . Intraoperative transesophageal echocardiogram N/A 06/04/2013    Procedure: INTRAOPERATIVE TRANSESOPHAGEAL ECHOCARDIOGRAM;  Surgeon: Grace Isaac, MD;  Location: [facility];  Service: Open Heart Surgery;  Laterality: N/A;  . Coronary artery bypass graft N/A 06/04/2013    Procedure: CORONARY ARTERY BYPASS GRAFTING (CABG);  Surgeon: Grace Isaac, MD;  Location: [facility];  Service: Open Heart Surgery;  Laterality: N/A;  . Wedge resection Right 06/04/2013    Procedure: WEDGE RESECTION Right Lower Lobe;  Surgeon: Grace Isaac, MD;  Location: [facility];  Service: Open Heart  Surgery;  Laterality: Right;    Current outpatient prescriptions:  .  ALPRAZolam (XANAX) 0.25 MG tablet, Take 0.25 mg by mouth 2 (two) times daily. 1/2 TABLET TWICE DAILY, Disp: , Rfl:  .  amLODipine (NORVASC) 2.5 MG tablet, Take 2.5 mg by mouth daily., Disp: , Rfl:  .  apixaban (ELIQUIS) 2.5 MG TABS tablet, Take 1 tablet (2.5 mg total) by mouth 2 (two) times daily., Disp: 60 tablet, Rfl: 11 .  atorvastatin (LIPITOR) 20 MG tablet, Take 20 mg by mouth daily., Disp: , Rfl:  .  ferrous sulfate 325 (65 FE) MG tablet, Take 325 mg by mouth daily with breakfast., Disp: , Rfl:  .  fluticasone (FLONASE) 50 MCG/ACT nasal spray, Place 2  sprays into the nose daily as needed for allergies. , Disp: , Rfl:  .  folic acid (FOLVITE) 1 MG tablet, Take 1 tablet (1 mg total) by mouth daily., Disp: , Rfl:  .  furosemide (LASIX) 40 MG tablet, Take 20 mg by mouth as needed for fluid or edema (Patient takes 1/2 pill). , Disp: , Rfl:  .  loratadine (CLARITIN) 10 MG tablet, Take 10 mg by mouth daily as needed for allergies. , Disp: , Rfl:  .  Multiple Vitamin (MULTIVITAMIN WITH MINERALS) TABS, Take 1 tablet by mouth daily., Disp: , Rfl:  .  Multiple Vitamins-Minerals (EYE VITAMINS PO), Take 1 tablet by mouth daily. Perservision Vitamin, Disp: , Rfl:  .  omeprazole (PRILOSEC) 20 MG capsule, Take 20 mg by mouth daily., Disp: , Rfl:  .  oxybutynin (DITROPAN XL) 15 MG 24 hr tablet, Take 15 mg by mouth at bedtime., Disp: , Rfl:  .  potassium chloride SA (K-DUR,KLOR-CON) 20 MEQ tablet, Take 1 tablet (20 mEq total) by mouth daily., Disp: 30 tablet, Rfl: 12 .  tamsulosin (FLOMAX) 0.4 MG CAPS capsule, Take 0.4 mg by mouth daily. , Disp: , Rfl:  .  traMADol (ULTRAM) 50 MG tablet, Take by mouth every 6 (six) hours as needed for moderate pain. Reported on 12/11/2015, Disp: , Rfl:     Allergies:   No Known Allergies  Social History:  The patient  reports that he quit smoking about 48 years ago. His smoking use included Cigarettes. He has a 30 pack-year smoking history. He has never used smokeless tobacco. He reports that he drinks alcohol. He reports that he does not use illicit drugs.   ROS:  Please see the history of present illness.   Denies any chest pain, syncope, fevers, orthopnea, PND.   All other systems reviewed and negative. Occasional flank pain/musculoskeletal discomfort.  PHYSICAL EXAM: VS:  BP 130/54 mmHg  Pulse 66  Ht 5\' 8"  (1.727 m)  Wt 158 lb 12.8 oz (72.031 kg)  BMI 24.15 kg/m2 Well nourished, well developed, in no acute distressElderly HEENT: normal Neck: no JVD Cardiac:  normal S1, S2; RRR; soft systolic murmur Lungs:  clear to  auscultation bilaterally, no wheezing, rhonchi or rales Abd: soft, nontender, no hepatomegaly Ext: no edema bruising. Skin: warm and dry Neuro: no focal abnormalities noted, anxious  Echocardiogram 06/21/13-normal functioning bioprosthetic valve. Normal EF.  EKG:  Right -06/23/15-sinus rhythm, first-degree AV block, 424 ms. 06/10/14-sinus rhythm, nonspecific ST-T wave change, first degree AV block, 338 ms. No longer right bundle branch block     ASSESSMENT AND PLAN:   1. Aortic stenosis/bioprosthetic aortic valve replacement-long rehabilitation efforts at [facility].   Echocardiogram reviewed. Valve is functioning well. Continue with dental antibiotic prophylaxis 2. Atypical flutter, asymptomatic  no unexplained falls. On anticoagulation. 3. Diastolic heart failure-currently well compensated.  Dilated atria noted on echocardiogram with normal ejection fraction. Had multiple questions asked and answered.  4. CAD-bypass times one. ASA. Easy bruising. Lipitor. 5.  atypical chest pain- fleeting, short-lived, left-sided, likely musculoskeletal, cramping. EKG reassuring. Question some anxiety. Xanax by Dr. Lysle Rubens. 6.  First-degree AV block- . No symptoms, no syncope. If heart rate decreases more significantly, he may require pacemaker. We discussed. Appreciate Dr. Tanna Furry consultation. No need for pacemaker at this time. 7. We will see him back in 6 months.  Signed, Candee Furbish, MD [facility]  01/04/2016 11:14 AM

## 2016-01-11 DIAGNOSIS — L820 Inflamed seborrheic keratosis: Secondary | ICD-10-CM | POA: Diagnosis not present

## 2016-01-11 DIAGNOSIS — M62838 Other muscle spasm: Secondary | ICD-10-CM | POA: Diagnosis not present

## 2016-01-11 DIAGNOSIS — L309 Dermatitis, unspecified: Secondary | ICD-10-CM | POA: Diagnosis not present

## 2016-01-11 DIAGNOSIS — Z1283 Encounter for screening for malignant neoplasm of skin: Secondary | ICD-10-CM | POA: Diagnosis not present

## 2016-01-11 DIAGNOSIS — F419 Anxiety disorder, unspecified: Secondary | ICD-10-CM | POA: Diagnosis not present

## 2016-01-11 DIAGNOSIS — R252 Cramp and spasm: Secondary | ICD-10-CM | POA: Diagnosis not present

## 2016-01-11 DIAGNOSIS — L570 Actinic keratosis: Secondary | ICD-10-CM | POA: Diagnosis not present

## 2016-01-11 DIAGNOSIS — X32XXXD Exposure to sunlight, subsequent encounter: Secondary | ICD-10-CM | POA: Diagnosis not present

## 2016-01-19 ENCOUNTER — Emergency Department (HOSPITAL_COMMUNITY)
Admission: EM | Admit: 2016-01-19 | Discharge: 2016-01-19 | Disposition: A | Discharge status: Home / Self Care | Payer: Medicare Other | Source: Home / Self Care | Attending: Emergency Medicine | Admitting: Emergency Medicine

## 2016-01-19 ENCOUNTER — Inpatient Hospital Stay (HOSPITAL_COMMUNITY)
Admission: EM | Admit: 2016-01-19 | Discharge: 2016-01-29 | DRG: 872 | Disposition: A | Discharge status: Home Health | Payer: Medicare Other | Attending: Internal Medicine | Admitting: Internal Medicine

## 2016-01-19 ENCOUNTER — Encounter (HOSPITAL_COMMUNITY): Payer: Self-pay

## 2016-01-19 ENCOUNTER — Emergency Department (HOSPITAL_COMMUNITY): Payer: Medicare Other

## 2016-01-19 DIAGNOSIS — M25552 Pain in left hip: Secondary | ICD-10-CM | POA: Diagnosis not present

## 2016-01-19 DIAGNOSIS — Y939 Activity, unspecified: Secondary | ICD-10-CM | POA: Insufficient documentation

## 2016-01-19 DIAGNOSIS — M62831 Muscle spasm of calf: Secondary | ICD-10-CM | POA: Diagnosis not present

## 2016-01-19 DIAGNOSIS — S79912A Unspecified injury of left hip, initial encounter: Secondary | ICD-10-CM | POA: Diagnosis not present

## 2016-01-19 DIAGNOSIS — I5032 Chronic diastolic (congestive) heart failure: Secondary | ICD-10-CM | POA: Diagnosis not present

## 2016-01-19 DIAGNOSIS — K6812 Psoas muscle abscess: Secondary | ICD-10-CM

## 2016-01-19 DIAGNOSIS — N183 Chronic kidney disease, stage 3 unspecified: Secondary | ICD-10-CM | POA: Diagnosis present

## 2016-01-19 DIAGNOSIS — M62838 Other muscle spasm: Secondary | ICD-10-CM | POA: Diagnosis not present

## 2016-01-19 DIAGNOSIS — I5031 Acute diastolic (congestive) heart failure: Secondary | ICD-10-CM

## 2016-01-19 DIAGNOSIS — R7881 Bacteremia: Secondary | ICD-10-CM | POA: Diagnosis not present

## 2016-01-19 DIAGNOSIS — R262 Difficulty in walking, not elsewhere classified: Secondary | ICD-10-CM | POA: Diagnosis present

## 2016-01-19 DIAGNOSIS — N401 Enlarged prostate with lower urinary tract symptoms: Secondary | ICD-10-CM | POA: Diagnosis present

## 2016-01-19 DIAGNOSIS — Y999 Unspecified external cause status: Secondary | ICD-10-CM | POA: Insufficient documentation

## 2016-01-19 DIAGNOSIS — M7981 Nontraumatic hematoma of soft tissue: Secondary | ICD-10-CM | POA: Diagnosis not present

## 2016-01-19 DIAGNOSIS — R339 Retention of urine, unspecified: Secondary | ICD-10-CM | POA: Diagnosis not present

## 2016-01-19 DIAGNOSIS — M545 Low back pain, unspecified: Secondary | ICD-10-CM | POA: Diagnosis present

## 2016-01-19 DIAGNOSIS — I130 Hypertensive heart and chronic kidney disease with heart failure and stage 1 through stage 4 chronic kidney disease, or unspecified chronic kidney disease: Secondary | ICD-10-CM | POA: Diagnosis present

## 2016-01-19 DIAGNOSIS — J189 Pneumonia, unspecified organism: Secondary | ICD-10-CM

## 2016-01-19 DIAGNOSIS — B9561 Methicillin susceptible Staphylococcus aureus infection as the cause of diseases classified elsewhere: Secondary | ICD-10-CM | POA: Diagnosis present

## 2016-01-19 DIAGNOSIS — W1839XA Other fall on same level, initial encounter: Secondary | ICD-10-CM

## 2016-01-19 DIAGNOSIS — I77819 Aortic ectasia, unspecified site: Secondary | ICD-10-CM | POA: Diagnosis present

## 2016-01-19 DIAGNOSIS — S40012A Contusion of left shoulder, initial encounter: Secondary | ICD-10-CM | POA: Diagnosis not present

## 2016-01-19 DIAGNOSIS — D638 Anemia in other chronic diseases classified elsewhere: Secondary | ICD-10-CM | POA: Diagnosis present

## 2016-01-19 DIAGNOSIS — Z87891 Personal history of nicotine dependence: Secondary | ICD-10-CM

## 2016-01-19 DIAGNOSIS — R509 Fever, unspecified: Secondary | ICD-10-CM | POA: Diagnosis not present

## 2016-01-19 DIAGNOSIS — L02612 Cutaneous abscess of left foot: Secondary | ICD-10-CM

## 2016-01-19 DIAGNOSIS — I340 Nonrheumatic mitral (valve) insufficiency: Secondary | ICD-10-CM | POA: Diagnosis present

## 2016-01-19 DIAGNOSIS — Z952 Presence of prosthetic heart valve: Secondary | ICD-10-CM

## 2016-01-19 DIAGNOSIS — F329 Major depressive disorder, single episode, unspecified: Secondary | ICD-10-CM | POA: Insufficient documentation

## 2016-01-19 DIAGNOSIS — M25562 Pain in left knee: Secondary | ICD-10-CM | POA: Diagnosis not present

## 2016-01-19 DIAGNOSIS — F419 Anxiety disorder, unspecified: Secondary | ICD-10-CM | POA: Diagnosis not present

## 2016-01-19 DIAGNOSIS — R030 Elevated blood-pressure reading, without diagnosis of hypertension: Secondary | ICD-10-CM | POA: Diagnosis not present

## 2016-01-19 DIAGNOSIS — M549 Dorsalgia, unspecified: Secondary | ICD-10-CM | POA: Diagnosis not present

## 2016-01-19 DIAGNOSIS — R252 Cramp and spasm: Secondary | ICD-10-CM

## 2016-01-19 DIAGNOSIS — Z9181 History of falling: Secondary | ICD-10-CM | POA: Diagnosis not present

## 2016-01-19 DIAGNOSIS — Z951 Presence of aortocoronary bypass graft: Secondary | ICD-10-CM

## 2016-01-19 DIAGNOSIS — M5430 Sciatica, unspecified side: Secondary | ICD-10-CM | POA: Diagnosis present

## 2016-01-19 DIAGNOSIS — E860 Dehydration: Secondary | ICD-10-CM | POA: Diagnosis not present

## 2016-01-19 DIAGNOSIS — Y929 Unspecified place or not applicable: Secondary | ICD-10-CM | POA: Insufficient documentation

## 2016-01-19 DIAGNOSIS — Z953 Presence of xenogenic heart valve: Secondary | ICD-10-CM

## 2016-01-19 DIAGNOSIS — H3530 Unspecified macular degeneration: Secondary | ICD-10-CM | POA: Diagnosis present

## 2016-01-19 DIAGNOSIS — R410 Disorientation, unspecified: Secondary | ICD-10-CM | POA: Diagnosis not present

## 2016-01-19 DIAGNOSIS — M1990 Unspecified osteoarthritis, unspecified site: Secondary | ICD-10-CM | POA: Diagnosis present

## 2016-01-19 DIAGNOSIS — M4647 Discitis, unspecified, lumbosacral region: Secondary | ICD-10-CM

## 2016-01-19 DIAGNOSIS — E785 Hyperlipidemia, unspecified: Secondary | ICD-10-CM | POA: Diagnosis present

## 2016-01-19 DIAGNOSIS — K5900 Constipation, unspecified: Secondary | ICD-10-CM | POA: Diagnosis not present

## 2016-01-19 DIAGNOSIS — D72829 Elevated white blood cell count, unspecified: Secondary | ICD-10-CM | POA: Diagnosis not present

## 2016-01-19 DIAGNOSIS — M4806 Spinal stenosis, lumbar region: Secondary | ICD-10-CM | POA: Diagnosis present

## 2016-01-19 DIAGNOSIS — E871 Hypo-osmolality and hyponatremia: Secondary | ICD-10-CM | POA: Diagnosis present

## 2016-01-19 DIAGNOSIS — W19XXXA Unspecified fall, initial encounter: Secondary | ICD-10-CM

## 2016-01-19 DIAGNOSIS — I4892 Unspecified atrial flutter: Secondary | ICD-10-CM | POA: Diagnosis present

## 2016-01-19 DIAGNOSIS — K219 Gastro-esophageal reflux disease without esophagitis: Secondary | ICD-10-CM | POA: Diagnosis present

## 2016-01-19 DIAGNOSIS — R109 Unspecified abdominal pain: Secondary | ICD-10-CM

## 2016-01-19 DIAGNOSIS — N400 Enlarged prostate without lower urinary tract symptoms: Secondary | ICD-10-CM | POA: Diagnosis present

## 2016-01-19 DIAGNOSIS — Z7901 Long term (current) use of anticoagulants: Secondary | ICD-10-CM

## 2016-01-19 DIAGNOSIS — S8992XA Unspecified injury of left lower leg, initial encounter: Secondary | ICD-10-CM | POA: Diagnosis not present

## 2016-01-19 HISTORY — DX: Chronic kidney disease, stage 3 unspecified: N18.30

## 2016-01-19 HISTORY — DX: Chronic kidney disease, stage 3 (moderate): N18.3

## 2016-01-19 LAB — CBC WITH DIFFERENTIAL/PLATELET
BASOS PCT: 0 %
Basophils Absolute: 0 10*3/uL (ref 0.0–0.1)
Basophils Absolute: 0.1 10*3/uL (ref 0.0–0.1)
Basophils Relative: 0 %
EOS ABS: 0 10*3/uL (ref 0.0–0.7)
EOS PCT: 0 %
Eosinophils Absolute: 0.1 10*3/uL (ref 0.0–0.7)
Eosinophils Relative: 1 %
HCT: 33.1 % — ABNORMAL LOW (ref 39.0–52.0)
HEMATOCRIT: 34.3 % — AB (ref 39.0–52.0)
HEMOGLOBIN: 12.3 g/dL — AB (ref 13.0–17.0)
HEMOGLOBIN: 11.4 g/dL — AB (ref 13.0–17.0)
LYMPHS PCT: 13 %
Lymphocytes Relative: 10 %
Lymphs Abs: 2 10*3/uL (ref 0.7–4.0)
Lymphs Abs: 1.4 10*3/uL (ref 0.7–4.0)
MCH: 33.5 pg (ref 26.0–34.0)
MCH: 32.9 pg (ref 26.0–34.0)
MCHC: 35.9 g/dL (ref 30.0–36.0)
MCHC: 34.4 g/dL (ref 30.0–36.0)
MCV: 93.5 fL (ref 78.0–100.0)
MCV: 95.4 fL (ref 78.0–100.0)
MONO ABS: 1.6 10*3/uL — AB (ref 0.1–1.0)
MONO ABS: 2.1 10*3/uL — AB (ref 0.1–1.0)
MONOS PCT: 10 %
MONOS PCT: 14 %
NEUTROS ABS: 11.9 10*3/uL — AB (ref 1.7–7.7)
NEUTROS PCT: 76 %
Neutro Abs: 11 10*3/uL — ABNORMAL HIGH (ref 1.7–7.7)
Neutrophils Relative %: 76 %
PLATELETS: 170 10*3/uL (ref 150–400)
Platelets: 184 10*3/uL (ref 150–400)
RBC: 3.67 MIL/uL — ABNORMAL LOW (ref 4.22–5.81)
RBC: 3.47 MIL/uL — ABNORMAL LOW (ref 4.22–5.81)
RDW: 12.8 % (ref 11.5–15.5)
RDW: 13.1 % (ref 11.5–15.5)
WBC: 15.6 10*3/uL — ABNORMAL HIGH (ref 4.0–10.5)
WBC: 14.6 10*3/uL — ABNORMAL HIGH (ref 4.0–10.5)

## 2016-01-19 LAB — BASIC METABOLIC PANEL
Anion gap: 7 (ref 5–15)
BUN: 21 mg/dL — AB (ref 6–20)
CHLORIDE: 99 mmol/L — AB (ref 101–111)
CO2: 23 mmol/L (ref 22–32)
CREATININE: 1.02 mg/dL (ref 0.61–1.24)
Calcium: 8.2 mg/dL — ABNORMAL LOW (ref 8.9–10.3)
GFR calc non Af Amer: >60 mL/min (ref >60)
Glucose, Bld: 120 mg/dL — ABNORMAL HIGH (ref 65–99)
POTASSIUM: 4.1 mmol/L (ref 3.5–5.1)
Sodium: 129 mmol/L — ABNORMAL LOW (ref 135–145)

## 2016-01-19 LAB — URINALYSIS, ROUTINE W REFLEX MICROSCOPIC
Bilirubin Urine: NEGATIVE
GLUCOSE, UA: NEGATIVE mg/dL
Ketones, ur: NEGATIVE mg/dL
LEUKOCYTES UA: NEGATIVE
NITRITE: NEGATIVE
PROTEIN: NEGATIVE mg/dL
Specific Gravity, Urine: 1.005 (ref 1.005–1.030)
pH: 8 (ref 5.0–8.0)

## 2016-01-19 LAB — URINE MICROSCOPIC-ADD ON

## 2016-01-19 LAB — COMPREHENSIVE METABOLIC PANEL
ALBUMIN: 3.6 g/dL (ref 3.5–5.0)
ALT: 16 U/L — ABNORMAL LOW (ref 17–63)
ANION GAP: 9 (ref 5–15)
AST: 23 U/L (ref 15–41)
Alkaline Phosphatase: 61 U/L (ref 38–126)
BUN: 21 mg/dL — ABNORMAL HIGH (ref 6–20)
CHLORIDE: 98 mmol/L — AB (ref 101–111)
CO2: 23 mmol/L (ref 22–32)
Calcium: 8.5 mg/dL — ABNORMAL LOW (ref 8.9–10.3)
Creatinine, Ser: 1.32 mg/dL — ABNORMAL HIGH (ref 0.61–1.24)
GFR calc non Af Amer: 46 mL/min — ABNORMAL LOW (ref >60)
GFR, EST AFRICAN AMERICAN: 53 mL/min — AB (ref >60)
GLUCOSE: 111 mg/dL — AB (ref 65–99)
POTASSIUM: 4.3 mmol/L (ref 3.5–5.1)
SODIUM: 130 mmol/L — AB (ref 135–145)
Total Bilirubin: 1.2 mg/dL (ref 0.3–1.2)
Total Protein: 6.2 g/dL — ABNORMAL LOW (ref 6.5–8.1)

## 2016-01-19 LAB — PHOSPHORUS: PHOSPHORUS: 3.4 mg/dL (ref 2.5–4.6)

## 2016-01-19 LAB — CK: Total CK: 94 U/L (ref 49–397)

## 2016-01-19 LAB — MAGNESIUM: MAGNESIUM: 1.9 mg/dL (ref 1.7–2.4)

## 2016-01-19 LAB — I-STAT CG4 LACTIC ACID, ED
LACTIC ACID, VENOUS: 1.06 mmol/L (ref 0.5–2.0)
Lactic Acid, Venous: 0.89 mmol/L (ref 0.5–2.0)

## 2016-01-19 LAB — SEDIMENTATION RATE: SED RATE: 36 mm/h — AB (ref 0–16)

## 2016-01-19 MED ORDER — HYDROCODONE-ACETAMINOPHEN 5-325 MG PO TABS: 1.0000 | ORAL_TABLET | Freq: Four times a day (QID) | ORAL | Status: DC | PRN

## 2016-01-19 MED ORDER — OXYBUTYNIN CHLORIDE ER 15 MG PO TB24
15.0000 mg | ORAL_TABLET | Freq: Every day | ORAL | Status: DC
  Administered 2016-01-20 – 2016-01-25 (×6): 15 mg via ORAL
  Filled 2016-01-19 (×7): qty 1

## 2016-01-19 MED ORDER — POTASSIUM CHLORIDE CRYS ER 20 MEQ PO TBCR
20.0000 meq | EXTENDED_RELEASE_TABLET | Freq: Every day | ORAL | Status: DC
  Administered 2016-01-20 – 2016-01-29 (×10): 20 meq via ORAL
  Filled 2016-01-19 (×10): qty 1

## 2016-01-19 MED ORDER — FENTANYL CITRATE (PF) 100 MCG/2ML IJ SOLN
50.0000 ug | Freq: Once | INTRAMUSCULAR | Status: AC
  Administered 2016-01-19: 50 ug via INTRAVENOUS
  Filled 2016-01-19: qty 2

## 2016-01-19 MED ORDER — TRAMADOL HCL 50 MG PO TABS
50.0000 mg | ORAL_TABLET | Freq: Four times a day (QID) | ORAL | Status: DC | PRN
  Administered 2016-01-20 – 2016-01-27 (×16): 50 mg via ORAL
  Filled 2016-01-19 (×16): qty 1

## 2016-01-19 MED ORDER — ADULT MULTIVITAMIN W/MINERALS CH
1.0000 | ORAL_TABLET | Freq: Every day | ORAL | Status: DC
  Administered 2016-01-20 – 2016-01-29 (×10): 1 via ORAL
  Filled 2016-01-19 (×10): qty 1

## 2016-01-19 MED ORDER — AMLODIPINE BESYLATE 2.5 MG PO TABS
2.5000 mg | ORAL_TABLET | Freq: Every day | ORAL | Status: DC
  Administered 2016-01-20 – 2016-01-29 (×10): 2.5 mg via ORAL
  Filled 2016-01-19 (×10): qty 1

## 2016-01-19 MED ORDER — TAMSULOSIN HCL 0.4 MG PO CAPS
0.4000 mg | ORAL_CAPSULE | Freq: Every day | ORAL | Status: DC
  Administered 2016-01-20 – 2016-01-29 (×9): 0.4 mg via ORAL
  Filled 2016-01-19 (×10): qty 1

## 2016-01-19 MED ORDER — ALPRAZOLAM 0.25 MG PO TABS
0.1250 mg | ORAL_TABLET | Freq: Two times a day (BID) | ORAL | Status: DC
  Administered 2016-01-20 – 2016-01-29 (×18): 0.125 mg via ORAL
  Filled 2016-01-19 (×18): qty 1

## 2016-01-19 MED ORDER — SODIUM CHLORIDE 0.9 % IV BOLUS (SEPSIS)
500.0000 mL | Freq: Once | INTRAVENOUS | Status: AC
  Administered 2016-01-19: 500 mL via INTRAVENOUS

## 2016-01-19 MED ORDER — TIZANIDINE HCL 4 MG PO TABS
2.0000 mg | ORAL_TABLET | Freq: Three times a day (TID) | ORAL | Status: DC
  Administered 2016-01-20 – 2016-01-29 (×27): 2 mg via ORAL
  Filled 2016-01-19 (×32): qty 1

## 2016-01-19 MED ORDER — ONDANSETRON HCL 4 MG/2ML IJ SOLN
4.0000 mg | Freq: Once | INTRAMUSCULAR | Status: AC
  Administered 2016-01-19: 4 mg via INTRAVENOUS
  Filled 2016-01-19: qty 2

## 2016-01-19 MED ORDER — LORATADINE 10 MG PO TABS: 10.0000 mg | ORAL_TABLET | Freq: Every day | ORAL | Status: DC | PRN

## 2016-01-19 MED ORDER — APIXABAN 2.5 MG PO TABS
2.5000 mg | ORAL_TABLET | Freq: Two times a day (BID) | ORAL | Status: DC
  Administered 2016-01-20 – 2016-01-29 (×19): 2.5 mg via ORAL
  Filled 2016-01-19 (×20): qty 1

## 2016-01-19 MED ORDER — FOLIC ACID 1 MG PO TABS
1.0000 mg | ORAL_TABLET | Freq: Every day | ORAL | Status: DC
  Administered 2016-01-20 – 2016-01-28 (×9): 1 mg via ORAL
  Filled 2016-01-19 (×10): qty 1

## 2016-01-19 MED ORDER — ATORVASTATIN CALCIUM 20 MG PO TABS
20.0000 mg | ORAL_TABLET | Freq: Every day | ORAL | Status: DC
  Administered 2016-01-20 – 2016-01-28 (×9): 20 mg via ORAL
  Filled 2016-01-19 (×9): qty 1

## 2016-01-19 MED ORDER — FERROUS SULFATE 325 (65 FE) MG PO TABS
325.0000 mg | ORAL_TABLET | Freq: Every day | ORAL | Status: DC
Start: 2016-01-20 — End: 2016-01-24
  Administered 2016-01-20 – 2016-01-24 (×5): 325 mg via ORAL
  Filled 2016-01-19 (×5): qty 1

## 2016-01-19 MED ORDER — FLUTICASONE PROPIONATE 50 MCG/ACT NA SUSP
2.0000 | Freq: Every day | NASAL | Status: DC | PRN
  Administered 2016-01-22 – 2016-01-27 (×4): 2 via NASAL
  Filled 2016-01-19: qty 16

## 2016-01-19 MED ORDER — DIAZEPAM 5 MG/ML IJ SOLN
5.0000 mg | Freq: Once | INTRAMUSCULAR | Status: AC
  Administered 2016-01-19: 5 mg via INTRAVENOUS
  Filled 2016-01-19: qty 2

## 2016-01-19 MED ORDER — PANTOPRAZOLE SODIUM 40 MG PO TBEC
40.0000 mg | DELAYED_RELEASE_TABLET | Freq: Every day | ORAL | Status: DC
  Administered 2016-01-20 – 2016-01-23 (×4): 40 mg via ORAL
  Filled 2016-01-19 (×5): qty 1

## 2016-01-19 MED ORDER — TIZANIDINE HCL 2 MG PO TABS: 2.0000 mg | ORAL_TABLET | Freq: Three times a day (TID) | ORAL | Status: DC

## 2016-01-19 NOTE — ED Notes (Signed)
RN attempt to call report to floor; Bed has not been assigned; RN to call back

## 2016-01-19 NOTE — ED Provider Notes (Signed)
TIME SEEN: 2:15 AM  CHIEF COMPLAINT: Muscle cramps  HPI: Pt is a 82 y.o. male with history of hyperlipidemia, CHF, atrial flutter on Eliquis who presents to the emergency department with cramps in his lower back and left leg.  Reports he has had issues with cramping intermittently for the past several years. States that several days ago he did have a mechanical fall and landed on his left side. He does not think that he fractured anything but states that where he fell is where he is having cramps. Denies hitting his head. Denies numbness, tingling or focal weakness. Denies new bowel or bladder incontinence. Denies any fever. No chest pain or shortness of breath. No vomiting or diarrhea.  ROS: See HPI Constitutional: no fever  Eyes: no drainage  ENT: no runny nose   Cardiovascular:  no chest pain  Resp: no SOB  GI: no vomiting GU: no dysuria Integumentary: no rash  Allergy: no hives  Musculoskeletal: no leg swelling  Neurological: no slurred speech ROS otherwise negative  PAST MEDICAL HISTORY/PAST SURGICAL HISTORY:  Past Medical History  Diagnosis Date  . BPH (benign prostatic hypertrophy)   . Diverticulosis 2006  . Depression   . Anemia   . Spinal stenosis   . Lung nodules 2010  . Actinic keratosis, hx of   . Macular degeneration   . Hypertension   . Allergy     allergic rhinitis  . Hearing loss     wears bilateral hearing aids  . Inguinal hernia     left side -surgery planned  . GERD (gastroesophageal reflux disease)   . Arthritis     generalized-more back issues  . Severe aortic stenosis 04/16/2013  . Nodule of right lung 05/24/2013  . CHF (congestive heart failure) ([place])   . Shortness of breath   . Atrial flutter ([place])   . Chronic diastolic heart failure ([place]) 08/11/2013    MEDICATIONS:  Prior to Admission medications   Medication Sig Start Date End Date Taking? Authorizing Provider  ALPRAZolam (XANAX) 0.25 MG tablet Take 0.25 mg by mouth 2 (two) times daily. 1/2  TABLET TWICE DAILY   Yes Historical Provider, MD  amLODipine (NORVASC) 2.5 MG tablet Take 2.5 mg by mouth daily.   Yes Historical Provider, MD  apixaban (ELIQUIS) 2.5 MG TABS tablet Take 1 tablet (2.5 mg total) by mouth 2 (two) times daily. 12/11/15  Yes Evans Lance, MD  atorvastatin (LIPITOR) 20 MG tablet Take 20 mg by mouth daily.   Yes Historical Provider, MD  ferrous sulfate 325 (65 FE) MG tablet Take 325 mg by mouth daily with breakfast.   Yes Historical Provider, MD  fluticasone (FLONASE) 50 MCG/ACT nasal spray Place 2 sprays into the nose daily as needed for allergies.    Yes Historical Provider, MD  folic acid (FOLVITE) 1 MG tablet Take 1 tablet (1 mg total) by mouth daily. 06/15/13  Yes Erin R Barrett, PA-C  furosemide (LASIX) 40 MG tablet Take 20 mg by mouth as needed for fluid or edema (Patient takes 1/2 pill).    Yes Historical Provider, MD  loratadine (CLARITIN) 10 MG tablet Take 10 mg by mouth daily as needed for allergies.    Yes Historical Provider, MD  Multiple Vitamin (MULTIVITAMIN WITH MINERALS) TABS Take 1 tablet by mouth daily.   Yes Historical Provider, MD  Multiple Vitamins-Minerals (EYE VITAMINS PO) Take 1 tablet by mouth daily. Perservision Vitamin   Yes Historical Provider, MD  omeprazole (PRILOSEC) 20 MG capsule Take  20 mg by mouth daily.   Yes Historical Provider, MD  oxybutynin (DITROPAN XL) 15 MG 24 hr tablet Take 15 mg by mouth at bedtime.   Yes Historical Provider, MD  potassium chloride SA (K-DUR,KLOR-CON) 20 MEQ tablet Take 1 tablet (20 mEq total) by mouth daily. 01/11/14  Yes Jerline Pain, MD  tamsulosin (FLOMAX) 0.4 MG CAPS capsule Take 0.4 mg by mouth daily.    Yes Historical Provider, MD  traMADol (ULTRAM) 50 MG tablet Take by mouth every 6 (six) hours as needed for moderate pain. Reported on 12/11/2015    Historical Provider, MD    ALLERGIES:  No Known Allergies  SOCIAL HISTORY:  Social History  Substance Use Topics  . Smoking status: Former Smoker --  1.50 packs/day for 20 years    Types: Cigarettes    Quit date: 10/29/1967  . Smokeless tobacco: Never Used  . Alcohol Use: 0.0 oz/week    0 Standard drinks or equivalent per week     Comment: occasionally    FAMILY HISTORY: Family History  Problem Relation Age of Onset  . Prostate cancer Father   . Cancer Mother     passed away age 63  . Cancer Father   . Cancer Brother   . Hypertension Brother     EXAM: BP 153/95 mmHg  Pulse 87  Temp(Src) 98.2 F (36.8 C) (Oral)  Resp 20  Ht 5\' 8"  (1.727 m)  Wt 160 lb (72.576 kg)  BMI 24.33 kg/m2  SpO2 100% CONSTITUTIONAL: Alert and oriented and responds appropriately to questions. Elderly, appears uncomfortable, afebrile HEAD: Normocephalic EYES: Conjunctivae clear, PERRL ENT: normal nose; no rhinorrhea; moist mucous membranes NECK: Supple, no meningismus, no LAD  CARD: RRR; S1 and S2 appreciated; no murmurs, no clicks, no rubs, no gallops RESP: Normal chest excursion without splinting or tachypnea; breath sounds clear and equal bilaterally; no wheezes, no rhonchi, no rales, no hypoxia or respiratory distress, speaking full sentences ABD/GI: Normal bowel sounds; non-distended; soft, non-tender, no rebound, no guarding, no peritoneal signs BACK:  The back appears normal and is minimally tender to palpation over the left lumbar paraspinal musculature without associated lesions, there is no CVA tenderness, no midline spinal tenderness or step-off or deformity EXT: Tender throughout the left hip, left lateral thigh and left knee without joint effusion or deformity. Normal ROM in all joints; otherwise extremities are non-tender to palpation; no edema; normal capillary refill; no cyanosis, no calf tenderness or swelling; extremities are warm and well-perfused, compartments are soft SKIN: Normal color for age and race; warm; no rash NEURO: Moves all extremities equally, sensation to light touch intact diffusely, cranial nerves II through XII  intact, normal gait PSYCH: The patient's mood and manner are appropriate. Grooming and personal hygiene are appropriate.  MEDICAL DECISION MAKING: Patient here with muscle cramps in his back and left leg. Given he did fall 2 days of these areas. He is requesting x-ray of his left shoulder as well. No sign of trauma to the left shoulder. We'll also check his electrolytes and CK level. It does appear he is on Lipitor which have discussed with family could be causing his symptoms. He does report that he only takes Lipitor intermittently but has been taking it over the past 3 days. Have a strong suspicion that this is what is causing his symptoms. He states that his primary care doctor saw him 2 days ago and started home on tramadol for pain which has not been helping him significantly.  ED PROGRESS: Patient's labs show leukocytosis with left shift. He has no infectious symptoms. No cough, vomiting or diarrhea, dysuria or hematuria. No rash. No signs of septic arthritis or cellulitis on exam. Doubt epidural abscess, discitis or transverse myelitis. He has no midline spinal tenderness on exam. Neurologically intact.  He is mildly hyponatremic with otherwise electrolytes within normal limits. Potassium 4.1, magnesium 1.9. CK is 94. Urine shows no sign of infection. X-rays of the lumbar spine, left hip, left knee and left shoulder are all unremarkable. Patient was given a dose of IV fentanyl emergency department reports feeling much better. I have advised him to hold his Lipitor and follow-up his PCP Dr. Lysle Rubens in [place] next week. We'll discharge him with prescription for Vicodin which have advised him not to take with tramadol for pain control. I feel he is safe to be discharged with his wife. Discussed return precautions. They verbalize understanding and are comfortable with this plan.     [place], DO 01/19/16 [phone]

## 2016-01-19 NOTE — H&P (Signed)
[facility] History and Physical  Joseph Garner [id number] DOB: [birth year] DOA: 01/19/2016  Referring physician: EDP PCP: Wenda Low, MD   Chief Complaint: Muscle spasms   HPI: Joseph Garner is a 82 y.o. male with h/o CHF, AVR.  Patient presents to the ED with muscle spasms.  These have been ongoing for months but worsening the past couple of weeks.  Zanaflex daily started last week hasnt really helped.  Had fall last night and went to [facility].  Given script for pain med and followed up with PCP.  PCP gave demerol and sent back into ED due to ongoing pain.  Review of Systems: Systems reviewed.  As above, otherwise negative  Past Medical History  Diagnosis Date  . BPH (benign prostatic hypertrophy)   . Diverticulosis 2006  . Depression   . Anemia   . Spinal stenosis   . Lung nodules 2010  . Actinic keratosis, hx of   . Macular degeneration   . Hypertension   . Allergy     allergic rhinitis  . Hearing loss     wears bilateral hearing aids  . Inguinal hernia     left side -surgery planned  . GERD (gastroesophageal reflux disease)   . Arthritis     generalized-more back issues  . Severe aortic stenosis 04/16/2013  . Nodule of right lung 05/24/2013  . CHF (congestive heart failure) ([place])   . Shortness of breath   . Atrial flutter ([place])   . Chronic diastolic heart failure ([place]) 08/11/2013   Past Surgical History  Procedure Laterality Date  . Tonsillectomy  1933  . Appendectomy  1947  . Neck surgery  1997    compressed nerve  . Skin biopsy  2003-2011    multiple   . Cataract extraction, bilateral      bilateral  . Inguinal hernia repair Left 02/11/2013    Procedure: LEFT LAPAROSCOPIC REPAIR INGUINAL HERNIA WITH MESH;  Surgeon: Adin Hector, MD;  Location: [facility];  Service: General;  Laterality: Left;  . Insertion of mesh Left 02/11/2013    Procedure: INSERTION OF MESH;  Surgeon: Adin Hector, MD;  Location: [facility];  Service: General;  Laterality: Left;  . Hernia  repair  02/24/13    lap LIH repair  . Aortic valve replacement N/A 06/04/2013    Procedure: AORTIC VALVE REPLACEMENT (AVR);  Surgeon: Grace Isaac, MD;  Location: [place];  Service: Open Heart Surgery;  Laterality: N/A;  . Intraoperative transesophageal echocardiogram N/A 06/04/2013    Procedure: INTRAOPERATIVE TRANSESOPHAGEAL ECHOCARDIOGRAM;  Surgeon: Grace Isaac, MD;  Location: [place];  Service: Open Heart Surgery;  Laterality: N/A;  . Coronary artery bypass graft N/A 06/04/2013    Procedure: CORONARY ARTERY BYPASS GRAFTING (CABG);  Surgeon: Grace Isaac, MD;  Location: [place];  Service: Open Heart Surgery;  Laterality: N/A;  . Wedge resection Right 06/04/2013    Procedure: WEDGE RESECTION Right Lower Lobe;  Surgeon: Grace Isaac, MD;  Location: [place];  Service: Open Heart Surgery;  Laterality: Right;   Social History:  reports that he quit smoking about 48 years ago. His smoking use included Cigarettes. He has a 30 pack-year smoking history. He has never used smokeless tobacco. He reports that he drinks alcohol. He reports that he does not use illicit drugs.  No Known Allergies  Family History  Problem Relation Age of Onset  . Prostate cancer Father   . Cancer Mother     passed away age 69  .  Cancer Father   . Cancer Brother   . Hypertension Brother      Prior to Admission medications   Medication Sig Start Date End Date Taking? Authorizing Provider  ALPRAZolam (XANAX) 0.25 MG tablet Take 0.25 mg by mouth 2 (two) times daily. 1/2 TABLET TWICE DAILY    Historical Provider, MD  amLODipine (NORVASC) 2.5 MG tablet Take 2.5 mg by mouth daily.    Historical Provider, MD  apixaban (ELIQUIS) 2.5 MG TABS tablet Take 1 tablet (2.5 mg total) by mouth 2 (two) times daily. 12/11/15   Evans Lance, MD  atorvastatin (LIPITOR) 20 MG tablet Take 20 mg by mouth daily.    Historical Provider, MD  ferrous sulfate 325 (65 FE) MG tablet Take 325 mg by mouth daily with breakfast.    Historical  Provider, MD  fluticasone (FLONASE) 50 MCG/ACT nasal spray Place 2 sprays into the nose daily as needed for allergies.     Historical Provider, MD  folic acid (FOLVITE) 1 MG tablet Take 1 tablet (1 mg total) by mouth daily. 06/15/13   Erin R Barrett, PA-C  furosemide (LASIX) 40 MG tablet Take 20 mg by mouth as needed for fluid or edema (Patient takes 1/2 pill).     Historical Provider, MD  HYDROcodone-acetaminophen (NORCO/VICODIN) 5-325 MG tablet Take 1 tablet by mouth every 6 (six) hours as needed. 01/19/16   Kristen N Ward, DO  loratadine (CLARITIN) 10 MG tablet Take 10 mg by mouth daily as needed for allergies.     Historical Provider, MD  Multiple Vitamin (MULTIVITAMIN WITH MINERALS) TABS Take 1 tablet by mouth daily.    Historical Provider, MD  Multiple Vitamins-Minerals (EYE VITAMINS PO) Take 1 tablet by mouth daily. Perservision Vitamin    Historical Provider, MD  omeprazole (PRILOSEC) 20 MG capsule Take 20 mg by mouth daily.    Historical Provider, MD  oxybutynin (DITROPAN XL) 15 MG 24 hr tablet Take 15 mg by mouth at bedtime.    Historical Provider, MD  potassium chloride SA (K-DUR,KLOR-CON) 20 MEQ tablet Take 1 tablet (20 mEq total) by mouth daily. 01/11/14   Jerline Pain, MD  tamsulosin (FLOMAX) 0.4 MG CAPS capsule Take 0.4 mg by mouth daily.     Historical Provider, MD  traMADol (ULTRAM) 50 MG tablet Take by mouth every 6 (six) hours as needed for moderate pain. Reported on 12/11/2015    Historical Provider, MD   Physical Exam: Filed Vitals:   01/19/16 1900 01/19/16 1945  BP: 96/49 121/64  Pulse: 74 73  Temp:    Resp:      BP 121/64 mmHg  Pulse 73  Temp(Src) 100 F (37.8 C) (Oral)  Resp 18  SpO2 99%  General Appearance:    Sedated no distress, appears stated age  Head:    Normocephalic, atraumatic  Eyes:    PERRL, EOMI, sclera non-icteric        Nose:   Nares without drainage or epistaxis. Mucosa, turbinates normal  Throat:   Moist mucous membranes. Oropharynx without  erythema or exudate.  Neck:   Supple. No carotid bruits.  No thyromegaly.  No lymphadenopathy.   Back:     No CVA tenderness, no spinal tenderness  Lungs:     Clear to auscultation bilaterally, without wheezes, rhonchi or rales  Chest wall:    No tenderness to palpitation  Heart:    Regular rate and rhythm without murmurs, gallops, rubs  Abdomen:     Soft, non-tender, nondistended, normal bowel  sounds, no organomegaly  Genitalia:    deferred  Rectal:    deferred  Extremities:   No clubbing, cyanosis or edema.  Pulses:   2+ and symmetric all extremities  Skin:   Skin color, texture, turgor normal, no rashes or lesions  Lymph nodes:   Cervical, supraclavicular, and axillary nodes normal  Neurologic:   CNII-XII intact. Normal strength, sensation and reflexes      throughout    Labs on Admission:  Basic Metabolic Panel:  Recent Labs Lab 01/19/16 0240 01/19/16 1542 01/19/16 1912  NA 129* 130*  --   K 4.1 4.3  --   CL 99* 98*  --   CO2 23 23  --   GLUCOSE 120* 111*  --   BUN 21* 21*  --   CREATININE 1.02 1.32*  --   CALCIUM 8.2* 8.5*  --   MG 1.9  --   --   PHOS  --   --  3.4   Liver Function Tests:  Recent Labs Lab 01/19/16 1542  AST 23  ALT 16*  ALKPHOS 61  BILITOT 1.2  PROT 6.2*  ALBUMIN 3.6   No results for input(s): LIPASE, AMYLASE in the last 168 hours. No results for input(s): AMMONIA in the last 168 hours. CBC:  Recent Labs Lab 01/19/16 0240 01/19/16 1542  WBC 15.6* 14.6*  NEUTROABS 11.9* 11.0*  HGB 12.3* 11.4*  HCT 34.3* 33.1*  MCV 93.5 95.4  PLT 184 170   Cardiac Enzymes:  Recent Labs Lab 01/19/16 0240  CKTOTAL 94    BNP (last 3 results) No results for input(s): PROBNP in the last 8760 hours. CBG: No results for input(s): GLUCAP in the last 168 hours.  Radiological Exams on Admission: Dg Lumbar Spine Complete  01/19/2016  CLINICAL DATA:  82 year old male with fall and back pain. EXAM: LUMBAR SPINE - COMPLETE 4+ VIEW COMPARISON:  None.  FINDINGS: There are 6 non rib bearing vertebrae. The uppermost vertebrae is numbered as L1 for the purposes of this study. There is no acute fracture or subluxation. The vertebral body heights are maintained. There is grade 1 L2-L3 retrolisthesis. There is degenerative changes with disc space narrowing most prominent at L2-L3 and L4-L5. The visualized transverse and spinous processes appear intact. The soft tissues are grossly unremarkable. There is atherosclerotic calcification of the abdominal aorta. IMPRESSION: No acute/traumatic lumbar spine pathology. Electronically Signed   By: Anner Crete M.D.   On: 01/19/2016 03:44   Dg Shoulder Left  01/19/2016  CLINICAL DATA:  [place] to the left side on Sunday. Bruising to the left shoulder. Good range of motion. EXAM: LEFT SHOULDER - 2+ VIEW COMPARISON:  None. FINDINGS: Degenerative changes in the acromioclavicular joint. No acute fracture or dislocation in the left shoulder. Coracoclavicular and acromioclavicular spaces are maintained. There appears to be anterior soft tissue swelling which may be due to hematoma or contusion. IMPRESSION: No acute bony abnormalities. Degenerative changes. Probable soft tissue hematoma. Electronically Signed   By: Lucienne Capers M.D.   On: 01/19/2016 03:46   Dg Knee Complete 4 Views Left  01/19/2016  CLINICAL DATA:  82 year old male with fall and left knee pain. EXAM: LEFT KNEE - COMPLETE 4+ VIEW COMPARISON:  None. FINDINGS: There is no acute fracture or dislocation. There is mild osteopenia. There is osteoarthritic changes of the knee with narrowing of the medial and lateral compartments more prominent involving the medial compartment. No joint effusion identified. Surgical clips noted in the soft tissues of the  posterior knee. IMPRESSION: No acute fracture or dislocation. Electronically Signed   By: Anner Crete M.D.   On: 01/19/2016 03:46   Dg Hip Unilat W Or W/o Pelvis 2-3 Views Left  01/19/2016  CLINICAL DATA:   82 year old male with fall and left hip pain. EXAM: DG HIP (WITH OR WITHOUT PELVIS) 2-3V LEFT COMPARISON:  None. FINDINGS: There is no acute fracture or dislocation. There is mild osteopenia. There is mild degenerative changes of the lower lumbar spine and hip joints. Hernia repair mesh noted over the left hemipelvis. The soft tissues are grossly unremarkable. IMPRESSION: No acute fracture or dislocation. Electronically Signed   By: Anner Crete M.D.   On: 01/19/2016 03:45    EKG: Independently reviewed.  Assessment/Plan Principal Problem:   Muscle spasms of both lower extremities Active Problems:   Hyponatremia   Chronic diastolic heart failure ([place])   1. Muscle spasms of both lower extremities - 1. Currently controlled after EDP gave him valium, but patient is now sleepy and sedated 2. Will admit to obs 3. PT/OT eval and treat 4. Increase zanaflex to TID 2. Chronic diastolic CHF - 1. Holding the lasix which it seems he takes PRN anyhow 2. Recheck BMP in AM 3. Got 500 cc IVF in ED but will hold off on further fluid. 3. Hyponatremia - 1. Mild, and appears to be VERY long standing on lab work going all the way back to 2010.    Code Status: Full Code  Family Communication: Family at bedside Disposition Plan: Admit to obs   Time spent: 30 min  GARDNER, JARED M. [facility] Pager [phone]  If 7AM-7PM, please contact the day team taking care of the patient [email or web address] Password [facility] 01/19/2016, 8:13 PM

## 2016-01-19 NOTE — ED Notes (Signed)
Dr. Jared at bedside. 

## 2016-01-19 NOTE — ED Notes (Signed)
Pt has been having leg spasms down both legs, onset 2 weeks ago but back spasms started last night. He went to [facility] last night for these issues and prescribed pain medication. He followed up Dr. Jethro Poling office today and was sent here because the back spasms were not going away.

## 2016-01-19 NOTE — ED Notes (Signed)
Main lab will add on Phosphorus level

## 2016-01-19 NOTE — Discharge Instructions (Signed)
Your labs, urine, xrays were normal today other than a mildly elevated white blood cell count.  Your muscle cramps may be secondary to your Lipitor (Atorvastatin).  I recommend you stop this medication and follow up with your PCP next week.  We are sending you home on pain medicine (hydrocodone), take only when needed.  Do not drive with this medication or take with muscle relaxers as it may make you very drowsy and at risk for fall.   Muscle Cramps and Spasms Muscle cramps and spasms occur when a muscle or muscles tighten and you have no control over this tightening (involuntary muscle contraction). They are a common problem and can develop in any muscle. The most common place is in the calf muscles of the leg. Both muscle cramps and muscle spasms are involuntary muscle contractions, but they also have differences:   Muscle cramps are sporadic and painful. They may last a few seconds to a quarter of an hour. Muscle cramps are often more forceful and last longer than muscle spasms.  Muscle spasms may or may not be painful. They may also last just a few seconds or much longer. CAUSES  It is uncommon for cramps or spasms to be due to a serious underlying problem. In many cases, the cause of cramps or spasms is unknown. Some common causes are:   Overexertion.   Overuse from repetitive motions (doing the same thing over and over).   Remaining in a certain position for a long period of time.   Improper preparation, form, or technique while performing a sport or activity.   Dehydration.   Injury.   Side effects of some medicines.   Abnormally low levels of the salts and ions in your blood (electrolytes), especially potassium and calcium. This could happen if you are taking water pills (diuretics) or you are pregnant.  Some underlying medical problems can make it more likely to develop cramps or spasms. These include, but are not limited to:   Diabetes.   Parkinson disease.    Hormone disorders, such as thyroid problems.   Alcohol abuse.   Diseases specific to muscles, joints, and bones.   Blood vessel disease where not enough blood is getting to the muscles.  HOME CARE INSTRUCTIONS   Stay well hydrated. Drink enough water and fluids to keep your urine clear or pale yellow.  It may be helpful to massage, stretch, and relax the affected muscle.  For tight or tense muscles, use a warm towel, heating pad, or hot shower water directed to the affected area.  If you are sore or have pain after a cramp or spasm, applying ice to the affected area may relieve discomfort.  Put ice in a plastic bag.  Place a towel between your skin and the bag.  Leave the ice on for 15-20 minutes, 03-04 times a day.  Medicines used to treat a known cause of cramps or spasms may help reduce their frequency or severity. Only take over-the-counter or prescription medicines as directed by your caregiver. SEEK MEDICAL CARE IF:  Your cramps or spasms get more severe, more frequent, or do not improve over time.  MAKE SURE YOU:   Understand these instructions.  Will watch your condition.  Will get help right away if you are not doing well or get worse.   This information is not intended to replace advice given to you by your health care provider. Make sure you discuss any questions you have with your health care provider.  Document Released: 04/05/2002 Document Revised: 02/08/2013 Document Reviewed: 09/30/2012 Elsevier Interactive Patient Education 2016 [place] in the Home  Falls can cause injuries and can affect people from all age groups. There are many simple things that you can do to make your home safe and to help prevent falls. WHAT CAN I DO ON THE OUTSIDE OF MY HOME?  Regularly repair the edges of walkways and driveways and fix any cracks.  Remove high doorway thresholds.  Trim any shrubbery on the main path into your home.  Use  bright outdoor lighting.  Clear walkways of debris and clutter, including tools and rocks.  Regularly check that handrails are securely fastened and in good repair. Both sides of any steps should have handrails.  Install guardrails along the edges of any raised decks or porches.  Have leaves, snow, and ice cleared regularly.  Use sand or salt on walkways during winter months.  In the garage, clean up any spills right away, including grease or oil spills. WHAT CAN I DO IN THE BATHROOM?  Use night lights.  Install grab bars by the toilet and in the tub and shower. Do not use towel bars as grab bars.  Use non-skid mats or decals on the floor of the tub or shower.  If you need to sit down while you are in the shower, use a plastic, non-slip stool.[place] Kitchen  Keep the floor dry. Immediately clean up any water that spills on the floor.  Remove soap buildup in the tub or shower on a regular basis.  Attach bath mats securely with double-sided non-slip rug tape.  Remove throw rugs and other tripping hazards from the floor. WHAT CAN I DO IN THE BEDROOM?  Use night lights.  Make sure that a bedside light is easy to reach.  Do not use oversized bedding that drapes onto the floor.  Have a firm chair that has side arms to use for getting dressed.  Remove throw rugs and other tripping hazards from the floor. WHAT CAN I DO IN THE KITCHEN?   Clean up any spills right away.  Avoid walking on wet floors.  Place frequently used items in easy-to-reach places.  If you need to reach for something above you, use a sturdy step stool that has a grab bar.  Keep electrical cables out of the way.  Do not use floor polish or wax that makes floors slippery. If you have to use wax, make sure that it is non-skid floor wax.  Remove throw rugs and other tripping hazards from the floor. WHAT CAN I DO IN THE STAIRWAYS?  Do not leave any items on the stairs.  Make sure that there are handrails on both  sides of the stairs. Fix handrails that are broken or loose. Make sure that handrails are as long as the stairways.  Check any carpeting to make sure that it is firmly attached to the stairs. Fix any carpet that is loose or worn.  Avoid having throw rugs at the top or bottom of stairways, or secure the rugs with carpet tape to prevent them from moving.  Make sure that you have a light switch at the top of the stairs and the bottom of the stairs. If you do not have them, have them installed. WHAT ARE SOME OTHER FALL PREVENTION TIPS?  Wear closed-toe shoes that fit well and support your feet. Wear shoes that have rubber soles or low heels.  When you use a stepladder, make sure  that it is completely opened and that the sides are firmly locked. Have someone hold the ladder while you are using it. Do not climb a closed stepladder.  Add color or contrast paint or tape to grab bars and handrails in your home. Place contrasting color strips on the first and last steps.  Use mobility aids as needed, such as canes, walkers, scooters, and crutches.  Turn on lights if it is dark. Replace any light bulbs that burn out.  Set up furniture so that there are clear paths. Keep the furniture in the same spot.  Fix any uneven floor surfaces.  Choose a carpet design that does not hide the edge of steps of a stairway.  Be aware of any and all pets.  Review your medicines with your healthcare provider. Some medicines can cause dizziness or changes in blood pressure, which increase your risk of falling. Talk with your health care provider about other ways that you can decrease your risk of falls. This may include working with a physical therapist or trainer to improve your strength, balance, and endurance.   This information is not intended to replace advice given to you by your health care provider. Make sure you discuss any questions you have with your health care provider.   Document Released: 10/04/2002  Document Revised: 02/28/2015 Document Reviewed: 11/18/2014 Elsevier Interactive Patient Education [employer].

## 2016-01-19 NOTE — ED Notes (Signed)
Spasms started about a week ago in left thigh. He went to Dr last Tuesday and prescribed a muscle relaxer with no relief. Pt reports right lumbar/thigh pain that started this evening and reports it the same "spasm like pain".

## 2016-01-19 NOTE — ED Provider Notes (Signed)
CSN: [id number]     Arrival date & time 01/19/16  1525 History   First MD Initiated Contact with Patient 01/19/16 1801     Chief Complaint  Patient presents with  . Spasms     (Consider location/radiation/quality/duration/timing/severity/associated sxs/prior Treatment) The history is provided by the patient, the spouse, a relative and medical records. No language interpreter was used.     Joseph Garner is a 82 y.o. male  with a hx of hyperlipidemia, CHF, atrial flutter on Eliquis, presents to the Emergency Department complaining of gradual, persistent, progressively worsening muscle spasms of the bilateral lower extremities and low back preventing patient from walking onset 2 weeks ago was fairly worsening in the last 3 days. Patient reports that the pain and spasms are so bad that he has difficulty lifting his legs in and out of the car, and out of bed and when walking up stairs. This caused a fall several days ago. He was evaluated in the emergency department last night and had negative x-rays.  He reports he has seen his primary care physician for this and was given Zanaflex.  He reports this is not helping. He denies numbness, tingling, focal weakness. He denies bowel or bladder incontinence. He denies fever, chills, headache, neck pain, chest pain, shortness of breath, abdominal pain, nausea, vomiting, diarrhea, syncope, dysuria. Patient reports that walking makes symptoms significantly worse and nothing really makes them better.    Patient presents from his primary care office where he was unable to walk and given Demerol IM 2. He reports this helped some but any movement causes the spasms to return.   Past Medical History  Diagnosis Date  . BPH (benign prostatic hypertrophy)   . Diverticulosis 2006  . Depression   . Anemia   . Spinal stenosis   . Lung nodules 2010  . Actinic keratosis, hx of   . Macular degeneration   . Hypertension   . Allergy     allergic rhinitis  . Hearing  loss     wears bilateral hearing aids  . Inguinal hernia     left side -surgery planned  . GERD (gastroesophageal reflux disease)   . Arthritis     generalized-more back issues  . Severe aortic stenosis 04/16/2013  . Nodule of right lung 05/24/2013  . CHF (congestive heart failure) ([place])   . Shortness of breath   . Atrial flutter ([place])   . Chronic diastolic heart failure ([place]) 08/11/2013   Past Surgical History  Procedure Laterality Date  . Tonsillectomy  1933  . Appendectomy  1947  . Neck surgery  1997    compressed nerve  . Skin biopsy  2003-2011    multiple   . Cataract extraction, bilateral      bilateral  . Inguinal hernia repair Left 02/11/2013    Procedure: LEFT LAPAROSCOPIC REPAIR INGUINAL HERNIA WITH MESH;  Surgeon: Adin Hector, MD;  Location: [facility];  Service: General;  Laterality: Left;  . Insertion of mesh Left 02/11/2013    Procedure: INSERTION OF MESH;  Surgeon: Adin Hector, MD;  Location: [facility];  Service: General;  Laterality: Left;  . Hernia repair  02/24/13    lap LIH repair  . Aortic valve replacement N/A 06/04/2013    Procedure: AORTIC VALVE REPLACEMENT (AVR);  Surgeon: Grace Isaac, MD;  Location: [place];  Service: Open Heart Surgery;  Laterality: N/A;  . Intraoperative transesophageal echocardiogram N/A 06/04/2013    Procedure: INTRAOPERATIVE TRANSESOPHAGEAL ECHOCARDIOGRAM;  Surgeon: Percell Miller  Maryruth Bun, MD;  Location: [place];  Service: Open Heart Surgery;  Laterality: N/A;  . Coronary artery bypass graft N/A 06/04/2013    Procedure: CORONARY ARTERY BYPASS GRAFTING (CABG);  Surgeon: Grace Isaac, MD;  Location: [place];  Service: Open Heart Surgery;  Laterality: N/A;  . Wedge resection Right 06/04/2013    Procedure: WEDGE RESECTION Right Lower Lobe;  Surgeon: Grace Isaac, MD;  Location: [place];  Service: Open Heart Surgery;  Laterality: Right;   Family History  Problem Relation Age of Onset  . Prostate cancer Father   . Cancer Mother     passed  away age 52  . Cancer Father   . Cancer Brother   . Hypertension Brother    Social History  Substance Use Topics  . Smoking status: Former Smoker -- 1.50 packs/day for 20 years    Types: Cigarettes    Quit date: 10/29/1967  . Smokeless tobacco: Never Used  . Alcohol Use: 0.0 oz/week    0 Standard drinks or equivalent per week     Comment: occasionally    Review of Systems  Constitutional: Negative for fever, diaphoresis, appetite change, fatigue and unexpected weight change.  HENT: Negative for mouth sores.   Eyes: Negative for visual disturbance.  Respiratory: Negative for cough, chest tightness, shortness of breath and wheezing.   Cardiovascular: Negative for chest pain.  Gastrointestinal: Negative for nausea, vomiting, abdominal pain, diarrhea and constipation.  Endocrine: Negative for polydipsia, polyphagia and polyuria.  Genitourinary: Negative for dysuria, urgency, frequency and hematuria.  Musculoskeletal: Negative for back pain and neck stiffness.       Lower back and leg spasms  Skin: Negative for rash.  Allergic/Immunologic: Negative for immunocompromised state.  Neurological: Negative for syncope, light-headedness and headaches.  Hematological: Does not bruise/bleed easily.  Psychiatric/Behavioral: Negative for sleep disturbance. The patient is not nervous/anxious.       Allergies  Review of patient's allergies indicates no known allergies.  Home Medications   Prior to Admission medications   Medication Sig Start Date End Date Taking? Authorizing Provider  ALPRAZolam (XANAX) 0.25 MG tablet Take 0.25 mg by mouth 2 (two) times daily. 1/2 TABLET TWICE DAILY    Historical Provider, MD  amLODipine (NORVASC) 2.5 MG tablet Take 2.5 mg by mouth daily.    Historical Provider, MD  apixaban (ELIQUIS) 2.5 MG TABS tablet Take 1 tablet (2.5 mg total) by mouth 2 (two) times daily. 12/11/15   Evans Lance, MD  atorvastatin (LIPITOR) 20 MG tablet Take 20 mg by mouth daily.     Historical Provider, MD  ferrous sulfate 325 (65 FE) MG tablet Take 325 mg by mouth daily with breakfast.    Historical Provider, MD  fluticasone (FLONASE) 50 MCG/ACT nasal spray Place 2 sprays into the nose daily as needed for allergies.     Historical Provider, MD  folic acid (FOLVITE) 1 MG tablet Take 1 tablet (1 mg total) by mouth daily. 06/15/13   Erin R Barrett, PA-C  furosemide (LASIX) 40 MG tablet Take 20 mg by mouth as needed for fluid or edema (Patient takes 1/2 pill).     Historical Provider, MD  HYDROcodone-acetaminophen (NORCO/VICODIN) 5-325 MG tablet Take 1 tablet by mouth every 6 (six) hours as needed. 01/19/16   Kristen N Ward, DO  loratadine (CLARITIN) 10 MG tablet Take 10 mg by mouth daily as needed for allergies.     Historical Provider, MD  Multiple Vitamin (MULTIVITAMIN WITH MINERALS) TABS Take 1 tablet  by mouth daily.    Historical Provider, MD  Multiple Vitamins-Minerals (EYE VITAMINS PO) Take 1 tablet by mouth daily. Perservision Vitamin    Historical Provider, MD  omeprazole (PRILOSEC) 20 MG capsule Take 20 mg by mouth daily.    Historical Provider, MD  oxybutynin (DITROPAN XL) 15 MG 24 hr tablet Take 15 mg by mouth at bedtime.    Historical Provider, MD  potassium chloride SA (K-DUR,KLOR-CON) 20 MEQ tablet Take 1 tablet (20 mEq total) by mouth daily. 01/11/14   Jerline Pain, MD  tamsulosin (FLOMAX) 0.4 MG CAPS capsule Take 0.4 mg by mouth daily.     Historical Provider, MD  traMADol (ULTRAM) 50 MG tablet Take by mouth every 6 (six) hours as needed for moderate pain. Reported on 12/11/2015    Historical Provider, MD   BP 111/61 mmHg  Pulse 66  Temp(Src) 100 F (37.8 C) (Oral)  Resp 18  SpO2 97% Physical Exam  Constitutional: He is oriented to person, place, and time. He appears well-developed and well-nourished. No distress.  HENT:  Head: Normocephalic and atraumatic.  Mouth/Throat: Oropharynx is clear and moist. No oropharyngeal exudate.  Eyes: Conjunctivae and EOM  are normal. Pupils are equal, round, and reactive to light. No scleral icterus.  No horizontal, vertical or rotational nystagmus  Neck: Normal range of motion. Neck supple.  Full active and passive ROM without pain No midline or paraspinal tenderness No nuchal rigidity or meningeal signs  Cardiovascular: Normal rate, regular rhythm, normal heart sounds and intact distal pulses.   No murmur heard. Pulmonary/Chest: Effort normal and breath sounds normal. No respiratory distress. He has no wheezes. He has no rales.  Abdominal: Soft. Bowel sounds are normal. He exhibits no distension. There is no tenderness. There is no rebound and no guarding.  Musculoskeletal: Normal range of motion.  Full range of motion of the T-spine and L-spine No tenderness to palpation of the spinous processes of the T-spine or L-spine No tenderness to palpation of the paraspinous muscles of the L-spine  Lymphadenopathy:    He has no cervical adenopathy.  Neurological: He is alert and oriented to person, place, and time. He has normal reflexes. No cranial nerve deficit. He exhibits normal muscle tone. Coordination normal.  Reflex Scores:      Bicep reflexes are 2+ on the right side and 2+ on the left side.      Brachioradialis reflexes are 2+ on the right side and 2+ on the left side.      Patellar reflexes are 2+ on the right side and 2+ on the left side.      Achilles reflexes are 2+ on the right side and 2+ on the left side. Mental Status:  Alert, oriented, thought content appropriate. Speech fluent without evidence of aphasia. Able to follow 2 step commands without difficulty.  Cranial Nerves:  II:  Peripheral visual fields grossly normal, pupils equal, round, reactive to light III,IV, VI: ptosis not present, extra-ocular motions intact bilaterally  V,VII: smile symmetric, facial light touch sensation equal VIII: hearing grossly baseline - patient is hard of hearing  IX,X: midline uvula rise  XI: bilateral  shoulder shrug equal and strong XII: midline tongue extension  Motor:  5/5 in upper and lower extremities bilaterally including strong and equal grip strength and dorsiflexion/plantar flexion Sensory: Pinprick and light touch normal in all extremities.  Deep Tendon Reflexes: 2+ and symmetric  Cerebellar: normal finger-to-nose with bilateral upper extremities Gait: Unable to test gait as patient is  unable to walk. Any movement severe spasm of his bilateral thighs CV: distal pulses palpable throughout   Skin: Skin is warm and dry. No rash noted. He is not diaphoretic. No erythema.  Psychiatric: He has a normal mood and affect. His behavior is normal. Judgment and thought content normal.  Nursing note and vitals reviewed.   ED Course  Procedures (including critical care time) Labs Review Labs Reviewed  CBC WITH DIFFERENTIAL/PLATELET - Abnormal; Notable for the following:    WBC 14.6 (*)    RBC 3.47 (*)    Hemoglobin 11.4 (*)    HCT 33.1 (*)    Neutro Abs 11.0 (*)    Monocytes Absolute 2.1 (*)    All other components within normal limits  COMPREHENSIVE METABOLIC PANEL - Abnormal; Notable for the following:    Sodium 130 (*)    Chloride 98 (*)    Glucose, Bld 111 (*)    BUN 21 (*)    Creatinine, Ser 1.32 (*)    Calcium 8.5 (*)    Total Protein 6.2 (*)    ALT 16 (*)    GFR calc non Af Amer 46 (*)    GFR calc Af Amer 53 (*)    All other components within normal limits  PHOSPHORUS  SEDIMENTATION RATE  BASIC METABOLIC PANEL  CBC  I-STAT CG4 LACTIC ACID, ED  I-STAT CG4 LACTIC ACID, ED    MDM   Final diagnoses:  Muscle spasms of both lower extremities  Bilateral low back pain without sciatica  Hyponatremia   Joseph Garner presents with worsening muscle spasms the last several days. He is now unable to walk.  Record review shows that he had x-rays in the early hours of this morning area did not acute fracture or dislocations noted on that hip, left knee, shoulder or lumbar  films.   Skin exam today without signs of cellulitis.  CK and potassium were within normal limits last night.  Patient is taking Lipitor but this was discontinued last night.  Last night he was hyponatremic and continues to be so today. His magnesium was normal last night. Patient felt better last night after fentanyl the symptoms have worsened today.  He remains hyponatremic now with an elevated serum creatinine. He also has an elevated white blood cell count of 14.6.  Temperature 100.0 orally upon arrival; afebrile at night.  She will need admission for pain control and further evaluation of his spasms as he is unable to walk.  7:24 PM Discussed with Dr. Alcario Drought who wishes to try valium and fluids who will evaluate.    8:25 PM Dr. Alcario Drought to admit.    Jarrett Soho Joseph Sahr, PA-C 01/19/16 2025  Davonna Belling, MD 01/19/16 [phone]

## 2016-01-19 NOTE — ED Notes (Signed)
Family also voices concern regarding his elevated WBC last night at [facility]; it was 46.

## 2016-01-20 ENCOUNTER — Observation Stay (HOSPITAL_COMMUNITY): Payer: Medicare Other

## 2016-01-20 ENCOUNTER — Inpatient Hospital Stay (HOSPITAL_COMMUNITY): Payer: Medicare Other

## 2016-01-20 DIAGNOSIS — I5032 Chronic diastolic (congestive) heart failure: Secondary | ICD-10-CM | POA: Diagnosis not present

## 2016-01-20 DIAGNOSIS — R509 Fever, unspecified: Secondary | ICD-10-CM | POA: Diagnosis not present

## 2016-01-20 DIAGNOSIS — K5730 Diverticulosis of large intestine without perforation or abscess without bleeding: Secondary | ICD-10-CM | POA: Diagnosis not present

## 2016-01-20 DIAGNOSIS — J181 Lobar pneumonia, unspecified organism: Secondary | ICD-10-CM | POA: Diagnosis not present

## 2016-01-20 DIAGNOSIS — M1990 Unspecified osteoarthritis, unspecified site: Secondary | ICD-10-CM | POA: Diagnosis present

## 2016-01-20 DIAGNOSIS — R252 Cramp and spasm: Secondary | ICD-10-CM | POA: Diagnosis not present

## 2016-01-20 DIAGNOSIS — R339 Retention of urine, unspecified: Secondary | ICD-10-CM | POA: Diagnosis not present

## 2016-01-20 DIAGNOSIS — N400 Enlarged prostate without lower urinary tract symptoms: Secondary | ICD-10-CM | POA: Diagnosis present

## 2016-01-20 DIAGNOSIS — K5900 Constipation, unspecified: Secondary | ICD-10-CM | POA: Diagnosis not present

## 2016-01-20 DIAGNOSIS — N401 Enlarged prostate with lower urinary tract symptoms: Secondary | ICD-10-CM | POA: Diagnosis present

## 2016-01-20 DIAGNOSIS — Z954 Presence of other heart-valve replacement: Secondary | ICD-10-CM | POA: Diagnosis not present

## 2016-01-20 DIAGNOSIS — E871 Hypo-osmolality and hyponatremia: Secondary | ICD-10-CM | POA: Diagnosis not present

## 2016-01-20 DIAGNOSIS — E860 Dehydration: Secondary | ICD-10-CM | POA: Diagnosis not present

## 2016-01-20 DIAGNOSIS — M62838 Other muscle spasm: Secondary | ICD-10-CM | POA: Diagnosis not present

## 2016-01-20 DIAGNOSIS — E785 Hyperlipidemia, unspecified: Secondary | ICD-10-CM | POA: Diagnosis present

## 2016-01-20 DIAGNOSIS — M79606 Pain in leg, unspecified: Secondary | ICD-10-CM | POA: Diagnosis not present

## 2016-01-20 DIAGNOSIS — H3530 Unspecified macular degeneration: Secondary | ICD-10-CM | POA: Diagnosis present

## 2016-01-20 DIAGNOSIS — K219 Gastro-esophageal reflux disease without esophagitis: Secondary | ICD-10-CM | POA: Diagnosis present

## 2016-01-20 DIAGNOSIS — I484 Atypical atrial flutter: Secondary | ICD-10-CM | POA: Diagnosis not present

## 2016-01-20 DIAGNOSIS — B9561 Methicillin susceptible Staphylococcus aureus infection as the cause of diseases classified elsewhere: Secondary | ICD-10-CM | POA: Diagnosis present

## 2016-01-20 DIAGNOSIS — F329 Major depressive disorder, single episode, unspecified: Secondary | ICD-10-CM | POA: Diagnosis present

## 2016-01-20 DIAGNOSIS — Z951 Presence of aortocoronary bypass graft: Secondary | ICD-10-CM | POA: Diagnosis not present

## 2016-01-20 DIAGNOSIS — D638 Anemia in other chronic diseases classified elsewhere: Secondary | ICD-10-CM | POA: Diagnosis present

## 2016-01-20 DIAGNOSIS — Z87891 Personal history of nicotine dependence: Secondary | ICD-10-CM | POA: Diagnosis not present

## 2016-01-20 DIAGNOSIS — M7981 Nontraumatic hematoma of soft tissue: Secondary | ICD-10-CM | POA: Diagnosis present

## 2016-01-20 DIAGNOSIS — I77819 Aortic ectasia, unspecified site: Secondary | ICD-10-CM | POA: Diagnosis present

## 2016-01-20 DIAGNOSIS — M545 Low back pain: Secondary | ICD-10-CM | POA: Diagnosis not present

## 2016-01-20 DIAGNOSIS — A4901 Methicillin susceptible Staphylococcus aureus infection, unspecified site: Secondary | ICD-10-CM | POA: Diagnosis not present

## 2016-01-20 DIAGNOSIS — Z7901 Long term (current) use of anticoagulants: Secondary | ICD-10-CM | POA: Diagnosis not present

## 2016-01-20 DIAGNOSIS — M4806 Spinal stenosis, lumbar region: Secondary | ICD-10-CM | POA: Diagnosis not present

## 2016-01-20 DIAGNOSIS — Z953 Presence of xenogenic heart valve: Secondary | ICD-10-CM | POA: Diagnosis not present

## 2016-01-20 DIAGNOSIS — M5430 Sciatica, unspecified side: Secondary | ICD-10-CM | POA: Diagnosis present

## 2016-01-20 DIAGNOSIS — Z952 Presence of prosthetic heart valve: Secondary | ICD-10-CM | POA: Diagnosis not present

## 2016-01-20 DIAGNOSIS — R7881 Bacteremia: Secondary | ICD-10-CM | POA: Diagnosis not present

## 2016-01-20 DIAGNOSIS — I340 Nonrheumatic mitral (valve) insufficiency: Secondary | ICD-10-CM | POA: Diagnosis not present

## 2016-01-20 DIAGNOSIS — R21 Rash and other nonspecific skin eruption: Secondary | ICD-10-CM | POA: Diagnosis not present

## 2016-01-20 DIAGNOSIS — I130 Hypertensive heart and chronic kidney disease with heart failure and stage 1 through stage 4 chronic kidney disease, or unspecified chronic kidney disease: Secondary | ICD-10-CM | POA: Diagnosis present

## 2016-01-20 DIAGNOSIS — Z452 Encounter for adjustment and management of vascular access device: Secondary | ICD-10-CM | POA: Diagnosis not present

## 2016-01-20 DIAGNOSIS — M4647 Discitis, unspecified, lumbosacral region: Secondary | ICD-10-CM | POA: Diagnosis not present

## 2016-01-20 DIAGNOSIS — R262 Difficulty in walking, not elsewhere classified: Secondary | ICD-10-CM | POA: Diagnosis present

## 2016-01-20 DIAGNOSIS — N183 Chronic kidney disease, stage 3 (moderate): Secondary | ICD-10-CM | POA: Diagnosis not present

## 2016-01-20 LAB — BASIC METABOLIC PANEL
ANION GAP: 9 (ref 5–15)
BUN: 19 mg/dL (ref 6–20)
CALCIUM: 8.1 mg/dL — AB (ref 8.9–10.3)
CO2: 20 mmol/L — ABNORMAL LOW (ref 22–32)
Chloride: 103 mmol/L (ref 101–111)
Creatinine, Ser: 1.11 mg/dL (ref 0.61–1.24)
GFR calc Af Amer: >60 mL/min (ref >60)
GFR, EST NON AFRICAN AMERICAN: 57 mL/min — AB (ref >60)
GLUCOSE: 106 mg/dL — AB (ref 65–99)
POTASSIUM: 3.9 mmol/L (ref 3.5–5.1)
SODIUM: 132 mmol/L — AB (ref 135–145)

## 2016-01-20 LAB — CBC
HEMATOCRIT: 28.4 % — AB (ref 39.0–52.0)
HEMOGLOBIN: 9.8 g/dL — AB (ref 13.0–17.0)
MCH: 32.7 pg (ref 26.0–34.0)
MCHC: 34.5 g/dL (ref 30.0–36.0)
MCV: 94.7 fL (ref 78.0–100.0)
Platelets: 159 10*3/uL (ref 150–400)
RBC: 3 MIL/uL — ABNORMAL LOW (ref 4.22–5.81)
RDW: 13.1 % (ref 11.5–15.5)
WBC: 14.1 10*3/uL — AB (ref 4.0–10.5)

## 2016-01-20 LAB — INFLUENZA PANEL BY PCR (TYPE A & B)
H1N1 flu by pcr: NOT DETECTED
INFLAPCR: NEGATIVE
INFLBPCR: NEGATIVE

## 2016-01-20 LAB — MRSA PCR SCREENING: MRSA BY PCR: NEGATIVE

## 2016-01-20 MED ORDER — PIPERACILLIN-TAZOBACTAM 3.375 G IVPB
3.3750 g | Freq: Three times a day (TID) | INTRAVENOUS | Status: DC
  Administered 2016-01-20 – 2016-01-22 (×6): 3.375 g via INTRAVENOUS
  Filled 2016-01-20 (×9): qty 50

## 2016-01-20 MED ORDER — VANCOMYCIN HCL IN DEXTROSE 1-5 GM/200ML-% IV SOLN
1000.0000 mg | Freq: Once | INTRAVENOUS | Status: AC
  Administered 2016-01-20: 1000 mg via INTRAVENOUS
  Filled 2016-01-20: qty 200

## 2016-01-20 MED ORDER — PIPERACILLIN-TAZOBACTAM 3.375 G IVPB
3.3750 g | Freq: Once | INTRAVENOUS | Status: AC
  Administered 2016-01-20: 3.375 g via INTRAVENOUS
  Filled 2016-01-20: qty 50

## 2016-01-20 MED ORDER — VANCOMYCIN HCL IN DEXTROSE 1-5 GM/200ML-% IV SOLN
1000.0000 mg | INTRAVENOUS | Status: DC
  Administered 2016-01-21 – 2016-01-23 (×3): 1000 mg via INTRAVENOUS
  Filled 2016-01-20 (×3): qty 200

## 2016-01-20 MED ORDER — SODIUM CHLORIDE 0.9 % IV SOLN
INTRAVENOUS | Status: AC
  Administered 2016-01-20: 18:00:00 via INTRAVENOUS

## 2016-01-20 NOTE — Evaluation (Signed)
Physical Therapy Evaluation Patient Details Name: Joseph Garner MRN: [id number] DOB: 04-26-1924 Today's Date: 01/20/2016   History of Present Illness  Pt. admitted 01/19/16 with muscle spasms worsening in the past few weeks.  Pt. with h/o CHF, AVR, spinal stenosis.  See physicians note for complete medical history   Clinical Impression  Pt. presents to PT with the above illness(s) and with the below problem list.  Pt. Will benefit from acute PT to address these problems in preparation for discharge to home setting.  Pt. Currently limited in his functional mobility by spasms in low back and buttocks today.  Pt. Reports spasms have occurred in his thighs as recently as yesterday.  Once medical course and treatment is determined, pt. Will benefit from acute then OPPT to address decrease in his safety, independence and balance.        Follow Up Recommendations Outpatient PT    Equipment Recommendations  None recommended by PT    Recommendations for Other Services       Precautions / Restrictions Precautions Precautions: Fall Precaution Comments: Droplet and contact precautions Restrictions Weight Bearing Restrictions: No      Mobility  Bed Mobility Overal bed mobility:  (not tested; pt. in recliner)                Transfers Overall transfer level: Needs assistance Equipment used: Quad cane Transfers: Sit to/from Stand Sit to Stand: Supervision         General transfer comment: Pt. needed increased time in anticipation of spasm  Ambulation/Gait Ambulation/Gait assistance: Supervision Ambulation Distance (Feet): 20 Feet (10' x 2 ) Assistive device: Quad cane Gait Pattern/deviations: Trunk flexed;Decreased stride length;Step-through pattern Gait velocity: decreased   General Gait Details: Pt. uses quad cane with only one tip of cane touching the floor.  Pt. encouraged to place quad cane flat for best support.  Pt. apprehensive in anticipation of spasms.  Pt. with  decreased stability in walking  Stairs            Wheelchair Mobility    Modified Rankin (Stroke Patients Only)       Balance Overall balance assessment: Needs assistance Sitting-balance support: No upper extremity supported;Feet supported Sitting balance-Leahy Scale: Good     Standing balance support: No upper extremity supported Standing balance-Leahy Scale: Fair   Single Leg Stance - Right Leg: 1 (1 second ) Single Leg Stance - Left Leg: 1 (1 second)           High Level Balance Comments: Pt. not able to complete further balance testing due to onset of spasms in low back and buttocks             Pertinent Vitals/Pain Pain Assessment: Faces Faces Pain Scale: Hurts even more Pain Location: low back and buttocks areas when spasms occur only Pain Descriptors / Indicators:  (grabbing) Pain Intervention(s): Limited activity within patient's tolerance;Monitored during session;Repositioned    Home Living Family/patient expects to be discharged to:: Private residence Living Arrangements: Spouse/significant other Available Help at Discharge: Family;Available 24 hours/day Type of Home: House Home Access: Stairs to enter Entrance Stairs-Rails: Left Entrance Stairs-Number of Steps: 6 Home Layout: One level Home Equipment: Grab bars - tub/shower;Cane - quad      Prior Function Level of Independence: Independent with assistive device(s)         Comments: has been using a quad cane the past few days for safety, does not normally needs AD. still drives     Hand Dominance  Dominant Hand: Right    Extremity/Trunk Assessment   Upper Extremity Assessment: Defer to OT evaluation           Lower Extremity Assessment: Overall WFL for tasks assessed      Cervical / Trunk Assessment: Normal  Communication   Communication: HOH  Cognition Arousal/Alertness: Awake/alert Behavior During Therapy: WFL for tasks assessed/performed Overall Cognitive Status:  Within Functional Limits for tasks assessed                      General Comments      Exercises        Assessment/Plan    PT Assessment Patient needs continued PT services  PT Diagnosis Difficulty walking;Acute pain;Abnormality of gait   PT Problem List Decreased activity tolerance;Decreased balance;Decreased mobility;Decreased knowledge of use of DME;Decreased safety awareness;Pain  PT Treatment Interventions DME instruction;Gait training;Stair training;Functional mobility training;Therapeutic activities;Balance training;Therapeutic exercise;Patient/family education   PT Goals (Current goals can be found in the Care Plan section) Acute Rehab PT Goals Patient Stated Goal: decrease in spasms for decreased pain so he can do things around the house with more ease PT Goal Formulation: With patient Time For Goal Achievement: 01/27/16 Potential to Achieve Goals: Good    Frequency Min 3X/week   Barriers to discharge        Co-evaluation PT/OT/SLP Co-Evaluation/Treatment: Yes Reason for Co-Treatment: Complexity of the patient's impairments (multi-system involvement);For patient/therapist safety PT goals addressed during session: Mobility/safety with mobility         End of Session   Activity Tolerance: Patient limited by pain Patient left: in chair;with call bell/phone within reach;with family/visitor present (wife and daughter) Nurse Communication: Mobility status    Functional Assessment Tool Used: clinical observation Functional Limitation: Mobility: Walking and moving around Mobility: Walking and Moving Around Current Status [id number]): At least 1 percent but less than 20 percent impaired, limited or restricted Mobility: Walking and Moving Around Goal Status [phone]): 0 percent impaired, limited or restricted    Time: 1340-1410 PT Time Calculation (min) (ACUTE ONLY): 30 min   Charges:   PT Evaluation $PT Eval Moderate Complexity: 1 Procedure PT  Treatments $Gait Training: 8-22 mins   PT G Codes:   PT G-Codes **NOT FOR INPATIENT CLASS** Functional Assessment Tool Used: clinical observation Functional Limitation: Mobility: Walking and moving around Mobility: Walking and Moving Around Current Status [id number]): At least 1 percent but less than 20 percent impaired, limited or restricted Mobility: Walking and Moving Around Goal Status [phone]): 0 percent impaired, limited or restricted    Ladona Ridgel 01/20/2016, 2:39 PM Gerlean Ren PT Acute Rehab Services [phone]

## 2016-01-20 NOTE — Progress Notes (Signed)
[facility] PROGRESS NOTE  Joseph Garner [id number] DOB: [birth year] DOA: 01/19/2016 PCP: Wenda Low, MD  Assessment/Plan:  Hyponatremia  Chronic diastolic heart failure ([place])   1. Fever-  1. Patient had a fever 100.4 earlier this morning. Fever workup started. Patient received vancomycin and Zosyn, pharmacy to dose continued abx. Blood cultures drawn. Chest x-ray ordered but initially patient refused. Patient family now agreeable to chest x-ray which is pending. 2. Muscle spasms of both lower extremities - 1. Patient not complaining of this issue today. States that his legs mostly ache not spasming. 2. Currently admitted his eyedrops may need to convert inpatient 3. PT/OT eval and treat 4. Continue Zanaflex has been increased to 3 times a day 3. Chronic diastolic CHF - 1. Continue holding when necessary Lasix 2. Recheck BMP in AM 3. Patient doing very well with by mouth intake no IV fluids needed at this time 4. Hyponatremia - 1. Mild, and appears to be VERY long standing on lab work going all the way back to 2010.   Code Status: Full code Family Communication: Wife and daughter (indicate person spoken with, relationship, and if by phone, the number) Disposition Plan: Likely discharge home   Consultants:  None  Procedures:  None  Antibiotics:  Vancomycin and Zosyn-started 01/20/2016 (indicate start date, and stop date if known)  HPI/Subjective: Patient had a fever overnight. Patient states that his legs are with  less spasm and more aching him this morning. Patient denies any headache shortness of breath chest pain. Patient also denies nausea vomiting. Patient states he is not having difficulty taking by mouth.  Later in the day nursing stated that patient began to refuse treatment and stated that he never spoke with his physician. This continues nursing as she saw me, to the floor. After speaking with me patient was once again agreeable  treatment.  Objective: Filed Vitals:   01/19/16 2130 01/20/16 0654  BP: 104/55 116/5  Pulse: 79 57  Temp: 97.5 F (36.4 C) 100.4 F (38 C)  Resp: 18 16    Intake/Output Summary (Last 24 hours) at 01/20/16 1442 Last data filed at 01/19/16 1953  Gross per 24 hour  Intake    500 ml  Output      0 ml  Net    500 ml   There were no vitals filed for this visit.  Exam:   General:  No diaphoresis, no anxiety  Cardiovascular: Regular rate and rhythm no murmurs rubs or gallops  Respiratory: Clear to auscultation bilaterally normal work of breathing  Abdomen: Nondistended bowel sounds normal, nontender palpation  Musculoskeletal: No deformity, 5/5 strength throughout all extremities   Data Reviewed: Basic Metabolic Panel:  Recent Labs Lab 01/19/16 0240 01/19/16 1542 01/19/16 1912 01/20/16 0545  NA 129* 130*  --  132*  K 4.1 4.3  --  3.9  CL 99* 98*  --  103  CO2 23 23  --  20*  GLUCOSE 120* 111*  --  106*  BUN 21* 21*  --  19  CREATININE 1.02 1.32*  --  1.11  CALCIUM 8.2* 8.5*  --  8.1*  MG 1.9  --   --   --   PHOS  --   --  3.4  --    Liver Function Tests:  Recent Labs Lab 01/19/16 1542  AST 23  ALT 16*  ALKPHOS 61  BILITOT 1.2  PROT 6.2*  ALBUMIN 3.6   No results for input(s): LIPASE, AMYLASE in the last 168  hours. No results for input(s): AMMONIA in the last 168 hours. CBC:  Recent Labs Lab 01/19/16 0240 01/19/16 1542 01/20/16 0545  WBC 15.6* 14.6* 14.1*  NEUTROABS 11.9* 11.0*  --   HGB 12.3* 11.4* 9.8*  HCT 34.3* 33.1* 28.4*  MCV 93.5 95.4 94.7  PLT 184 170 159   Cardiac Enzymes:  Recent Labs Lab 01/19/16 0240  CKTOTAL 94   BNP (last 3 results) No results for input(s): BNP in the last 8760 hours.  ProBNP (last 3 results) No results for input(s): PROBNP in the last 8760 hours.  CBG: No results for input(s): GLUCAP in the last 168 hours.  Recent Results (from the past 240 hour(s))  MRSA PCR Screening     Status: None    Collection Time: 01/20/16  6:43 AM  Result Value Ref Range Status   MRSA by PCR NEGATIVE NEGATIVE Final    Comment:        The GeneXpert MRSA Assay (FDA approved for NASAL specimens only), is one component of a comprehensive MRSA colonization surveillance program. It is not intended to diagnose MRSA infection nor to guide or monitor treatment for MRSA infections.   Culture, blood (Routine X 2) w Reflex to ID Panel     Status: None (Preliminary result)   Collection Time: 01/20/16  8:57 AM  Result Value Ref Range Status   Specimen Description BLOOD LEFT FOREARM  Final   Special Requests BOTTLES DRAWN AEROBIC ONLY 10CC  Final   Culture PENDING  Incomplete   Report Status PENDING  Incomplete     Studies: Dg Lumbar Spine Complete  01/19/2016  CLINICAL DATA:  82 year old male with fall and back pain. EXAM: LUMBAR SPINE - COMPLETE 4+ VIEW COMPARISON:  None. FINDINGS: There are 6 non rib bearing vertebrae. The uppermost vertebrae is numbered as L1 for the purposes of this study. There is no acute fracture or subluxation. The vertebral body heights are maintained. There is grade 1 L2-L3 retrolisthesis. There is degenerative changes with disc space narrowing most prominent at L2-L3 and L4-L5. The visualized transverse and spinous processes appear intact. The soft tissues are grossly unremarkable. There is atherosclerotic calcification of the abdominal aorta. IMPRESSION: No acute/traumatic lumbar spine pathology. Electronically Signed   By: Anner Crete M.D.   On: 01/19/2016 03:44   Dg Shoulder Left  01/19/2016  CLINICAL DATA:  [place] to the left side on Sunday. Bruising to the left shoulder. Good range of motion. EXAM: LEFT SHOULDER - 2+ VIEW COMPARISON:  None. FINDINGS: Degenerative changes in the acromioclavicular joint. No acute fracture or dislocation in the left shoulder. Coracoclavicular and acromioclavicular spaces are maintained. There appears to be anterior soft tissue swelling which may  be due to hematoma or contusion. IMPRESSION: No acute bony abnormalities. Degenerative changes. Probable soft tissue hematoma. Electronically Signed   By: Lucienne Capers M.D.   On: 01/19/2016 03:46   Dg Knee Complete 4 Views Left  01/19/2016  CLINICAL DATA:  82 year old male with fall and left knee pain. EXAM: LEFT KNEE - COMPLETE 4+ VIEW COMPARISON:  None. FINDINGS: There is no acute fracture or dislocation. There is mild osteopenia. There is osteoarthritic changes of the knee with narrowing of the medial and lateral compartments more prominent involving the medial compartment. No joint effusion identified. Surgical clips noted in the soft tissues of the posterior knee. IMPRESSION: No acute fracture or dislocation. Electronically Signed   By: Anner Crete M.D.   On: 01/19/2016 03:46   Dg Hip Unilat W  Or W/o Pelvis 2-3 Views Left  01/19/2016  CLINICAL DATA:  82 year old male with fall and left hip pain. EXAM: DG HIP (WITH OR WITHOUT PELVIS) 2-3V LEFT COMPARISON:  None. FINDINGS: There is no acute fracture or dislocation. There is mild osteopenia. There is mild degenerative changes of the lower lumbar spine and hip joints. Hernia repair mesh noted over the left hemipelvis. The soft tissues are grossly unremarkable. IMPRESSION: No acute fracture or dislocation. Electronically Signed   By: Anner Crete M.D.   On: 01/19/2016 03:45    Scheduled Meds: . ALPRAZolam  0.125 mg Oral BID  . amLODipine  2.5 mg Oral Daily  . apixaban  2.5 mg Oral BID  . atorvastatin  20 mg Oral Daily  . ferrous sulfate  325 mg Oral Q breakfast  . folic acid  1 mg Oral Daily  . multivitamin with minerals  1 tablet Oral Daily  . oxybutynin  15 mg Oral QHS  . pantoprazole  40 mg Oral Daily  . potassium chloride SA  20 mEq Oral Daily  . tamsulosin  0.4 mg Oral Daily  . tiZANidine  2 mg Oral TID   Continuous Infusions:   Principal Problem:   Muscle spasms of both lower extremities Active Problems:    Hyponatremia   Chronic diastolic heart failure ([place])    Time spent: 30 minutes    [facility] Pager [phone]. If 7PM-7AM, please contact night-coverage at [email or web address], password [facility] 01/20/2016, 2:42 PM

## 2016-01-20 NOTE — Progress Notes (Signed)
ANTIBIOTIC CONSULT NOTE - INITIAL  Pharmacy Consult for Vancomycin and Zosyn Indication: sepsis  No Known Allergies  Patient Measurements: Height: 5\' 8"  (172.7 cm) (from 01/19/16) Weight: 160 lb 0.9 oz (72.6 kg) (from 01/19/16) IBW/kg (Calculated) : 68.4   Vital Signs: Temp: 100.4 F (38 C) (03/25 0654) Temp Source: Oral (03/25 0654) BP: 116/5 mmHg (03/25 0654) Pulse Rate: 57 (03/25 0654) Intake/Output from previous day: 03/24 0701 - 03/25 0700 In: 500 [I.V.:500] Out: -  Intake/Output from this shift:    Labs:  Recent Labs  01/19/16 0240 01/19/16 1542 01/20/16 0545  WBC 15.6* 14.6* 14.1*  HGB 12.3* 11.4* 9.8*  PLT 184 170 159  CREATININE 1.02 1.32* 1.11   Estimated Creatinine Clearance: 43.6 mL/min (by C-G formula based on Cr of 1.11). No results for input(s): VANCOTROUGH, VANCOPEAK, VANCORANDOM, GENTTROUGH, GENTPEAK, GENTRANDOM, TOBRATROUGH, TOBRAPEAK, TOBRARND, AMIKACINPEAK, AMIKACINTROU, AMIKACIN in the last 72 hours.   Microbiology: Recent Results (from the past 720 hour(s))  MRSA PCR Screening     Status: None   Collection Time: 01/20/16  6:43 AM  Result Value Ref Range Status   MRSA by PCR NEGATIVE NEGATIVE Final    Comment:        The GeneXpert MRSA Assay (FDA approved for NASAL specimens only), is one component of a comprehensive MRSA colonization surveillance program. It is not intended to diagnose MRSA infection nor to guide or monitor treatment for MRSA infections.   Culture, blood (Routine X 2) w Reflex to ID Panel     Status: None (Preliminary result)   Collection Time: 01/20/16  8:57 AM  Result Value Ref Range Status   Specimen Description BLOOD LEFT FOREARM  Final   Special Requests BOTTLES DRAWN AEROBIC ONLY 10CC  Final   Culture PENDING  Incomplete   Report Status PENDING  Incomplete    Medical History: Past Medical History  Diagnosis Date  . BPH (benign prostatic hypertrophy)   . Diverticulosis 2006  . Depression   . Anemia   .  Spinal stenosis   . Lung nodules 2010  . Actinic keratosis, hx of   . Macular degeneration   . Hypertension   . Allergy     allergic rhinitis  . Hearing loss     wears bilateral hearing aids  . Inguinal hernia     left side -surgery planned  . GERD (gastroesophageal reflux disease)   . Arthritis     generalized-more back issues  . Severe aortic stenosis 04/16/2013  . Nodule of right lung 05/24/2013  . CHF (congestive heart failure) ([place])   . Shortness of breath   . Atrial flutter ([place])   . Chronic diastolic heart failure ([place]) 08/11/2013    Medications:  Scheduled:  . ALPRAZolam  0.125 mg Oral BID  . amLODipine  2.5 mg Oral Daily  . apixaban  2.5 mg Oral BID  . atorvastatin  20 mg Oral Daily  . ferrous sulfate  325 mg Oral Q breakfast  . folic acid  1 mg Oral Daily  . multivitamin with minerals  1 tablet Oral Daily  . oxybutynin  15 mg Oral QHS  . pantoprazole  40 mg Oral Daily  . potassium chloride SA  20 mEq Oral Daily  . tamsulosin  0.4 mg Oral Daily  . tiZANidine  2 mg Oral TID   Assessment: 82 y.o male with fever to 100.4 this morning. He received 1st doses of antibiotics this AM, Vancomycin 1000mg  IV and Zosyn 3.375 gm IV @ 10:11.  Pharmacy consulted to continue Vanc and zosyn of sepsis coverage.  Blood cultures drawn.  CXR pending  Goal of Therapy:  Vancomycin trough level 15-20 mcg/ml  Plan:  Zosyn 3.375 gm IV q8h (infuse each dose over 4 hrs) Vancomycin 1000 mg IV q24h -next dose due tomorrow AM Monitor renal function, cultures, clinical progress, vancomycin trough if needed at steady state.  Nicole Cella, RPh Clinical Pharmacist Pager: [phone] 01/20/2016,3:10 PM

## 2016-01-20 NOTE — Evaluation (Signed)
Occupational Therapy Evaluation Patient Details Name: Joseph Garner MRN: [id number] DOB: [birth year] Today's Date: 01/20/2016    History of Present Illness Pt. admitted 01/19/16 with muscle spasms worsening in the past few weeks.  Pt. with h/o CHF, AVR, spinal stenosis.  See physicians note for complete medical history    Clinical Impression   Pt reports he was independent with ADLs PTA. Currently pt requires close supervision for safety with functional mobility using a quad cane and ADLs. Pt presenting with spasms in LEs and back that come and go limiting his independence and safety with ADLs and functional mobility. Pt would benefit from continued skilled OT to address established goals.    Follow Up Recommendations  No OT follow up;Supervision/Assistance - 24 hour    Equipment Recommendations  3 in 1 bedside comode    Recommendations for Other Services       Precautions / Restrictions Precautions Precautions: Fall Precaution Comments: Droplet and contact precautions Restrictions Weight Bearing Restrictions: No      Mobility Bed Mobility Overal bed mobility:  (not tested; pt. in recliner)                Transfers Overall transfer level: Needs assistance Equipment used: Quad cane Transfers: Sit to/from Stand Sit to Stand: Supervision         General transfer comment: Pt. needed increased time in anticipation of spasm    Balance Overall balance assessment: Needs assistance Sitting-balance support: No upper extremity supported;Feet supported Sitting balance-Leahy Scale: Good     Standing balance support: No upper extremity supported;During functional activity Standing balance-Leahy Scale: Fair Standing balance comment: Pt able to stand at sink and wash hands without UE support Single Leg Stance - Right Leg: 1 (1 second ) Single Leg Stance - Left Leg: 1 (1 second)           High Level Balance Comments: Pt. not able to complete further balance testing due  to onset of spasms in low back and buttocks            ADL Overall ADL's : Needs assistance/impaired Eating/Feeding: Set up;Sitting   Grooming: Supervision/safety;Standing;Wash/dry hands   Upper Body Bathing: Supervision/ safety;Sitting   Lower Body Bathing: Supervison/ safety;Sit to/from stand   Upper Body Dressing : Supervision/safety;Sitting;Set up   Lower Body Dressing: Supervision/safety;Sit to/from stand;Set up   Toilet Transfer: Supervision/safety;Ambulation;BSC (BSC over toilet; quad cane) Toilet Transfer Details (indicate cue type and reason): Simulated by transfer from chair Toileting- Clothing Manipulation and Hygiene: Supervision/safety;Sit to/from stand       Functional mobility during ADLs: Supervision/safety;Cane General ADL Comments: Pts wife present for OT eval. Educated on home safety      Vision Vision Assessment?: No apparent visual deficits   Perception     Praxis      Pertinent Vitals/Pain Pain Assessment: Faces Faces Pain Scale: Hurts even more Pain Location: lower back and buttocks when spasms occur Pain Descriptors / Indicators: Other (Comment) (grabbing) Pain Intervention(s): Limited activity within patient's tolerance;Monitored during session;Repositioned     Hand Dominance Right   Extremity/Trunk Assessment Upper Extremity Assessment Upper Extremity Assessment: Overall WFL for tasks assessed (Slight decrease in R shoulder AROM ~120 degrees)   Lower Extremity Assessment Lower Extremity Assessment: Defer to PT evaluation   Cervical / Trunk Assessment Cervical / Trunk Assessment: Normal   Communication Communication Communication: HOH   Cognition Arousal/Alertness: Awake/alert Behavior During Therapy: WFL for tasks assessed/performed Overall Cognitive Status: Within Functional Limits for tasks assessed  General Comments       Exercises       Shoulder Instructions      Home Living  Family/patient expects to be discharged to:: Private residence Living Arrangements: Spouse/significant other Available Help at Discharge: Family;Available 24 hours/day Type of Home: House Home Access: Stairs to enter [employer] of Steps: 6 Entrance Stairs-Rails: Left Home Layout: One level     Bathroom Shower/Tub: Occupational psychologist: Handicapped height     Home Equipment: Grab bars - tub/shower;Cane - quad          Prior Functioning/Environment Level of Independence: Independent with assistive device(s)        Comments: has been using a quad cane the past few days for safety, does not normally needs AD. still drives    OT Diagnosis: Acute pain   OT Problem List: Decreased activity tolerance;Impaired balance (sitting and/or standing);Decreased safety awareness;Decreased knowledge of use of DME or AE;Decreased knowledge of precautions;Pain   OT Treatment/Interventions: Self-care/ADL training;Energy conservation;DME and/or AE instruction;Therapeutic activities;Patient/family education;Balance training    OT Goals(Current goals can be found in the care plan section) Acute Rehab OT Goals Patient Stated Goal: decrease in spasms for decreased pain so he can do things around the house with more ease OT Goal Formulation: With patient/family Time For Goal Achievement: 02/03/16 Potential to Achieve Goals: Good ADL Goals Pt Will Perform Grooming: with modified independence;standing Pt Will Perform Upper Body Bathing: with modified independence;sitting;standing Pt Will Perform Lower Body Bathing: with modified independence;sit to/from stand Pt Will Transfer to Toilet: with modified independence;ambulating;bedside commode (BSC over toilet) Pt Will Perform Toileting - Clothing Manipulation and hygiene: with modified independence;sit to/from stand Pt Will Perform Tub/Shower Transfer: Shower transfer;ambulating;3 in 1;with modified independence  OT Frequency:  Min 2X/week   Barriers to D/C:            Co-evaluation PT/OT/SLP Co-Evaluation/Treatment: Yes Reason for Co-Treatment: Complexity of the patient's impairments (multi-system involvement);For patient/therapist safety PT goals addressed during session: Mobility/safety with mobility OT goals addressed during session: ADL's and self-care;Other (comment) (mobility)      End of Session Equipment Utilized During Treatment: Gait belt;Other (comment) (quad cane)  Activity Tolerance: Patient tolerated treatment well Patient left: in chair;with call bell/phone within reach;with family/visitor present   Time: 1340-1410 OT Time Calculation (min): 30 min Charges:  OT General Charges $OT Visit: 1 Procedure OT Evaluation $OT Eval Moderate Complexity: 1 Procedure G-Codes: OT G-codes **NOT FOR INPATIENT CLASS** Functional Assessment Tool Used: Clinical judgement Functional Limitation: Self care Self Care Current Status [id number]): At least 1 percent but less than 20 percent impaired, limited or restricted Self Care Goal Status [id number]): At least 1 percent but less than 20 percent impaired, limited or restricted   Binnie Kand M.S., OTR/L Pager: [phone]  01/20/2016, 4:01 PM

## 2016-01-21 ENCOUNTER — Inpatient Hospital Stay (HOSPITAL_COMMUNITY): Payer: Medicare Other

## 2016-01-21 DIAGNOSIS — R7881 Bacteremia: Secondary | ICD-10-CM | POA: Diagnosis present

## 2016-01-21 LAB — CBC WITH DIFFERENTIAL/PLATELET
BASOS PCT: 0 %
Basophils Absolute: 0 10*3/uL (ref 0.0–0.1)
EOS ABS: 0 10*3/uL (ref 0.0–0.7)
EOS PCT: 0 %
HCT: 24.7 % — ABNORMAL LOW (ref 39.0–52.0)
HEMOGLOBIN: 8.8 g/dL — AB (ref 13.0–17.0)
LYMPHS ABS: 1.5 10*3/uL (ref 0.7–4.0)
Lymphocytes Relative: 9 %
MCH: 33.6 pg (ref 26.0–34.0)
MCHC: 35.6 g/dL (ref 30.0–36.0)
MCV: 94.3 fL (ref 78.0–100.0)
MONO ABS: 1.5 10*3/uL — AB (ref 0.1–1.0)
MONOS PCT: 9 %
Neutro Abs: 14.7 10*3/uL — ABNORMAL HIGH (ref 1.7–7.7)
Neutrophils Relative %: 82 %
PLATELETS: 133 10*3/uL — AB (ref 150–400)
RBC: 2.62 MIL/uL — ABNORMAL LOW (ref 4.22–5.81)
RDW: 13.3 % (ref 11.5–15.5)
WBC: 17.8 10*3/uL — ABNORMAL HIGH (ref 4.0–10.5)

## 2016-01-21 LAB — BASIC METABOLIC PANEL
Anion gap: 7 (ref 5–15)
BUN: 26 mg/dL — ABNORMAL HIGH (ref 6–20)
CO2: 23 mmol/L (ref 22–32)
Calcium: 8 mg/dL — ABNORMAL LOW (ref 8.9–10.3)
Chloride: 101 mmol/L (ref 101–111)
Creatinine, Ser: 1.36 mg/dL — ABNORMAL HIGH (ref 0.61–1.24)
GFR calc Af Amer: 52 mL/min — ABNORMAL LOW (ref >60)
GFR, EST NON AFRICAN AMERICAN: 44 mL/min — AB (ref >60)
GLUCOSE: 112 mg/dL — AB (ref 65–99)
Potassium: 3.7 mmol/L (ref 3.5–5.1)
SODIUM: 131 mmol/L — AB (ref 135–145)

## 2016-01-21 LAB — SEDIMENTATION RATE: SED RATE: 37 mm/h — AB (ref 0–16)

## 2016-01-21 MED ORDER — SODIUM CHLORIDE 0.9 % IV SOLN: INTRAVENOUS | Status: AC

## 2016-01-21 NOTE — Progress Notes (Signed)
Critical lab value; blood culture: anaerobic gram positive cocci in clusters.  On call notified via [place]. No new orders at this time.

## 2016-01-21 NOTE — Progress Notes (Signed)
Discontinued contact and droplet precautions.  PCR swab was negative and influenza tests were all negative.

## 2016-01-21 NOTE — Progress Notes (Signed)
[facility] PROGRESS NOTE  Jericho Munden [id number] DOB: 03-Oct-1924 DOA: 01/19/2016 PCP: Wenda Low, MD  Assessment/Plan:  Hyponatremia  Chronic diastolic heart failure ([place])   1. Fever-  1. Patient had a fever 100.4 yesterday. Fever workup started underway. Cont vancomycin and Zosyn, pharmacy to dosed. Blood cultures x2 post for GPC in clusters. Chest x-ray ordered but initially patient refused. Patient family now agreeable to chest x-ray which is pending. 2. Muscle spasms of both lower extremities - 1. Patient not complaining of this issue today. States that his legs mostly ache not spasming. 2. Currently admitted his eyedrops may need to convert inpatient 3. PT/OT eval and treat 4. Continue Zanaflex has been increased to 3 times a day 3. Chronic diastolic CHF - 1. Continue holding when necessary Lasix, no change in resp status 2. Recheck BMP in AM 3. Patient doing very well with by mouth intake no IV fluids needed at this time 4. Hyponatremia - 1. Mild, and appears to be VERY long standing on lab work going all the way back to 2010.   Code Status: Full code Family Communication: Wife and daughter (indicate person spoken with, relationship, and if by phone, the number) Disposition Plan: Likely discharge home   Consultants:  None  Procedures:  None  Antibiotics:  Vancomycin and Zosyn-started 01/20/2016 (indicate start date, and stop date if known)  HPI/Subjective: Afebrile overnight. Family not available for discussion. Patient states his leg pain is now gone. Eating and drinking well. Denies chills shortness breath cough rash.  Objective: Filed Vitals:   01/20/16 2028 01/21/16 0558  BP: 144/60 107/55  Pulse: 71 62  Temp: 97.9 F (36.6 C) 98.8 F (37.1 C)  Resp: 16     Intake/Output Summary (Last 24 hours) at 01/21/16 1405 Last data filed at 01/21/16 0558  Gross per 24 hour  Intake      0 ml  Output    425 ml  Net   -425 ml   Filed Weights    01/20/16 1500  Weight: 72.6 kg (160 lb 0.9 oz)    Exam:   General:  No diaphoresis, no anxiety  Cardiovascular: Regular rate and rhythm no MRG  Respiratory: Clear to auscultation bilaterally normal work of breathing  Abdomen: Nondistended bowel sounds normal, nontender to palpation  Musculoskeletal: No deformity, 5/5 strength throughout all extremities   Data Reviewed: Basic Metabolic Panel:  Recent Labs Lab 01/19/16 0240 01/19/16 1542 01/19/16 1912 01/20/16 0545 01/21/16 0607  NA 129* 130*  --  132* 131*  K 4.1 4.3  --  3.9 3.7  CL 99* 98*  --  103 101  CO2 23 23  --  20* 23  GLUCOSE 120* 111*  --  106* 112*  BUN 21* 21*  --  19 26*  CREATININE 1.02 1.32*  --  1.11 1.36*  CALCIUM 8.2* 8.5*  --  8.1* 8.0*  MG 1.9  --   --   --   --   PHOS  --   --  3.4  --   --    Liver Function Tests:  Recent Labs Lab 01/19/16 1542  AST 23  ALT 16*  ALKPHOS 61  BILITOT 1.2  PROT 6.2*  ALBUMIN 3.6   No results for input(s): LIPASE, AMYLASE in the last 168 hours. No results for input(s): AMMONIA in the last 168 hours. CBC:  Recent Labs Lab 01/19/16 0240 01/19/16 1542 01/20/16 0545 01/21/16 0607  WBC 15.6* 14.6* 14.1* 17.8*  NEUTROABS 11.9* 11.0*  --  14.7*  HGB 12.3* 11.4* 9.8* 8.8*  HCT 34.3* 33.1* 28.4* 24.7*  MCV 93.5 95.4 94.7 94.3  PLT 184 170 159 133*   Cardiac Enzymes:  Recent Labs Lab 01/19/16 0240  CKTOTAL 94   BNP (last 3 results) No results for input(s): BNP in the last 8760 hours.  ProBNP (last 3 results) No results for input(s): PROBNP in the last 8760 hours.  CBG: No results for input(s): GLUCAP in the last 168 hours.  Recent Results (from the past 240 hour(s))  MRSA PCR Screening     Status: None   Collection Time: 01/20/16  6:43 AM  Result Value Ref Range Status   MRSA by PCR NEGATIVE NEGATIVE Final    Comment:        The GeneXpert MRSA Assay (FDA approved for NASAL specimens only), is one component of a comprehensive MRSA  colonization surveillance program. It is not intended to diagnose MRSA infection nor to guide or monitor treatment for MRSA infections.   Culture, blood (Routine X 2) w Reflex to ID Panel     Status: None (Preliminary result)   Collection Time: 01/20/16  8:50 AM  Result Value Ref Range Status   Specimen Description BLOOD LEFT ANTECUBITAL  Final   Special Requests BOTTLES DRAWN AEROBIC AND ANAEROBIC 10CC  Final   Culture  Setup Time   Final    GRAM POSITIVE COCCI IN CLUSTERS CRITICAL RESULT CALLED TO, READ BACK BY AND VERIFIED WITH: TO SWHITE(RN) BY TCLEVELAND 01/21/16 AT 1:08AM IN BOTH AEROBIC AND ANAEROBIC BOTTLES    Culture TOO YOUNG TO READ  Final   Report Status PENDING  Incomplete  Culture, blood (Routine X 2) w Reflex to ID Panel     Status: None (Preliminary result)   Collection Time: 01/20/16  8:57 AM  Result Value Ref Range Status   Specimen Description BLOOD LEFT FOREARM  Final   Special Requests BOTTLES DRAWN AEROBIC ONLY 10CC  Final   Culture  Setup Time   Final    GRAM POSITIVE COCCI IN CLUSTERS CRITICAL RESULT CALLED TO, READ BACK BY AND VERIFIED WITH: TO SWHITEHORN(RN) BY TCLEVELAND 01/21/2016 AT 2:33AM AEROBIC BOTTLE ONLY    Culture TOO YOUNG TO READ  Final   Report Status PENDING  Incomplete     Studies: Dg Chest Port 1 View  01/20/2016  CLINICAL DATA:  Fever. EXAM: PORTABLE CHEST 1 VIEW COMPARISON:  09/28/2015 FINDINGS: Stable changes from previous cardiac surgery and valve replacement. Cardiac silhouette is normal in size and configuration. Normal mediastinal and hilar contours. Clear lungs.  No pleural effusion or pneumothorax. Bony thorax is intact. IMPRESSION: No acute cardiopulmonary disease. Electronically Signed   By: Lajean Manes M.D.   On: 01/20/2016 21:30    Scheduled Meds: . ALPRAZolam  0.125 mg Oral BID  . amLODipine  2.5 mg Oral Daily  . apixaban  2.5 mg Oral BID  . atorvastatin  20 mg Oral Daily  . ferrous sulfate  325 mg Oral Q breakfast  .  folic acid  1 mg Oral Daily  . multivitamin with minerals  1 tablet Oral Daily  . oxybutynin  15 mg Oral QHS  . pantoprazole  40 mg Oral Daily  . piperacillin-tazobactam (ZOSYN)  IV  3.375 g Intravenous 3 times per day  . potassium chloride SA  20 mEq Oral Daily  . tamsulosin  0.4 mg Oral Daily  . tiZANidine  2 mg Oral TID  . vancomycin  1,000 mg Intravenous Q24H  Continuous Infusions: . sodium chloride 100 mL/hr at 01/21/16 [id number]    Principal Problem:   Fever, unspecified Active Problems:   Hyponatremia   Chronic diastolic heart failure (HCC)   Muscle spasms of both lower extremities   Fever    Time spent: 30 minutes    [place] [facility] Pager [phone]. If 7PM-7AM, please contact night-coverage at [email or web address], password [facility] 01/21/2016, 2:05 PM  LOS: 1 day

## 2016-01-22 ENCOUNTER — Encounter (HOSPITAL_COMMUNITY): Payer: Self-pay | Admitting: Family Medicine

## 2016-01-22 ENCOUNTER — Inpatient Hospital Stay (HOSPITAL_COMMUNITY): Payer: Medicare Other

## 2016-01-22 DIAGNOSIS — R252 Cramp and spasm: Secondary | ICD-10-CM

## 2016-01-22 DIAGNOSIS — N183 Chronic kidney disease, stage 3 unspecified: Secondary | ICD-10-CM | POA: Diagnosis present

## 2016-01-22 DIAGNOSIS — M545 Low back pain: Secondary | ICD-10-CM

## 2016-01-22 DIAGNOSIS — A4901 Methicillin susceptible Staphylococcus aureus infection, unspecified site: Secondary | ICD-10-CM

## 2016-01-22 DIAGNOSIS — R7881 Bacteremia: Secondary | ICD-10-CM

## 2016-01-22 LAB — COMPREHENSIVE METABOLIC PANEL
ALK PHOS: 54 U/L (ref 38–126)
ALT: 23 U/L (ref 17–63)
AST: 28 U/L (ref 15–41)
Albumin: 2.6 g/dL — ABNORMAL LOW (ref 3.5–5.0)
Anion gap: 8 (ref 5–15)
BUN: 27 mg/dL — AB (ref 6–20)
CALCIUM: 7.9 mg/dL — AB (ref 8.9–10.3)
CHLORIDE: 99 mmol/L — AB (ref 101–111)
CO2: 23 mmol/L (ref 22–32)
CREATININE: 1.22 mg/dL (ref 0.61–1.24)
GFR calc non Af Amer: 51 mL/min — ABNORMAL LOW (ref >60)
GFR, EST AFRICAN AMERICAN: 59 mL/min — AB (ref >60)
GLUCOSE: 114 mg/dL — AB (ref 65–99)
Potassium: 3.7 mmol/L (ref 3.5–5.1)
SODIUM: 130 mmol/L — AB (ref 135–145)
Total Bilirubin: 0.8 mg/dL (ref 0.3–1.2)
Total Protein: 5.2 g/dL — ABNORMAL LOW (ref 6.5–8.1)

## 2016-01-22 LAB — CBC WITH DIFFERENTIAL/PLATELET
BASOS ABS: 0 10*3/uL (ref 0.0–0.1)
Basophils Relative: 0 %
EOS ABS: 0.1 10*3/uL (ref 0.0–0.7)
EOS PCT: 1 %
HCT: 25.6 % — ABNORMAL LOW (ref 39.0–52.0)
HEMOGLOBIN: 8.8 g/dL — AB (ref 13.0–17.0)
LYMPHS ABS: 1.6 10*3/uL (ref 0.7–4.0)
LYMPHS PCT: 12 %
MCH: 32.2 pg (ref 26.0–34.0)
MCHC: 34.4 g/dL (ref 30.0–36.0)
MCV: 93.8 fL (ref 78.0–100.0)
Monocytes Absolute: 1.1 10*3/uL — ABNORMAL HIGH (ref 0.1–1.0)
Monocytes Relative: 8 %
NEUTROS PCT: 79 %
Neutro Abs: 10.5 10*3/uL — ABNORMAL HIGH (ref 1.7–7.7)
PLATELETS: 146 10*3/uL — AB (ref 150–400)
RBC: 2.73 MIL/uL — AB (ref 4.22–5.81)
RDW: 13.2 % (ref 11.5–15.5)
WBC: 13.3 10*3/uL — AB (ref 4.0–10.5)

## 2016-01-22 LAB — ECHOCARDIOGRAM COMPLETE
Height: 68 in
Weight: 2560.86 oz

## 2016-01-22 LAB — RHEUMATOID FACTOR: Rhuematoid fact SerPl-aCnc: 15.3 IU/mL — ABNORMAL HIGH (ref 0.0–13.9)

## 2016-01-22 MED ORDER — TIZANIDINE HCL 4 MG PO TABS
2.0000 mg | ORAL_TABLET | Freq: Once | ORAL | Status: AC
  Administered 2016-01-22: 2 mg via ORAL
  Filled 2016-01-22: qty 1

## 2016-01-22 MED ORDER — GADOBENATE DIMEGLUMINE 529 MG/ML IV SOLN
15.0000 mL | Freq: Once | INTRAVENOUS | Status: AC | PRN
  Administered 2016-01-22: 15 mL via INTRAVENOUS

## 2016-01-22 MED ORDER — SODIUM CHLORIDE 0.9 % IV SOLN: INTRAVENOUS | Status: DC

## 2016-01-22 MED ORDER — CEFAZOLIN SODIUM-DEXTROSE 2-4 GM/100ML-% IV SOLN
2.0000 g | Freq: Three times a day (TID) | INTRAVENOUS | Status: DC
  Administered 2016-01-22 – 2016-01-29 (×22): 2 g via INTRAVENOUS
  Filled 2016-01-22 (×24): qty 100

## 2016-01-22 NOTE — Progress Notes (Signed)
Occupational Therapy Treatment Patient Details Name: Joseph Garner MRN: [id number] DOB: 1923-11-30 Today's Date: 01/22/2016    History of present illness Pt. admitted 01/19/16 with muscle spasms worsening in the past few weeks.  Pt. with h/o CHF, AVR, spinal stenosis.  See physicians note for complete medical history    OT comments  Pt severely limited by pain this session. Pt completed transfers and bed mobility with min-mod assist due to pain. Recommending pt use RW for all OOB mobility due to pain and balance deficits. Will continue to follow acutely.   Follow Up Recommendations  Home health OT;Supervision/Assistance - 24 hour (may change pending results of MRI and pt's progress)    Equipment Recommendations  3 in 1 bedside comode    Recommendations for Other Services      Precautions / Restrictions Precautions Precautions: Fall Restrictions Weight Bearing Restrictions: No       Mobility Bed Mobility Overal bed mobility: Needs Assistance Bed Mobility: Sit to Supine     Supine to sit: Min assist Sit to supine: Mod assist;HOB elevated   General bed mobility comments: HOB elevated, use of bedrails. Mod assist for trunk support and to bring bilateral LE onto bed. Assist to reposition. Cues for hand placement and technique  Transfers Overall transfer level: Needs assistance Equipment used: 1 person hand held assist Transfers: Sit to/from Stand Sit to Stand: Min assist         General transfer comment: Min assist for boost to stand and to stabilize balance upon standing. Pt in severe pain and impulsively stood up to gain relief from pain. Verbal cues for safety and hand placement    Balance Overall balance assessment: Needs assistance Sitting-balance support: No upper extremity supported;Feet supported Sitting balance-Leahy Scale: Fair Sitting balance - Comments: due to pain   Standing balance support: No upper extremity supported;During functional activity Standing  balance-Leahy Scale: Fair Standing balance comment: able to maintain balance for brief period of time without UE support                   ADL Overall ADL's : Needs assistance/impaired                 Upper Body Dressing : Sitting;Minimal assistance   Lower Body Dressing: Moderate assistance;Sit to/from stand   Toilet Transfer: Minimal assistance;Ambulation;Cueing for safety (quad cane) Toilet Transfer Details (indicate cue type and reason): simulated to bed Toileting- Clothing Manipulation and Hygiene: Minimal assistance;Sit to/from stand         General ADL Comments: Pt with severe back pain sitting EOB on OT arrival. Pt very limited this session. Pt's wife and daughter present for OT session.      Vision                     Perception     Praxis      Cognition   Behavior During Therapy: WFL for tasks assessed/performed Overall Cognitive Status: Within Functional Limits for tasks assessed                       Extremity/Trunk Assessment               Exercises     Shoulder Instructions       General Comments      Pertinent Vitals/ Pain       Pain Assessment: Faces Faces Pain Scale: Hurts worst Pain Location: back and bilateral LE Pain Descriptors / Indicators: Discomfort;Grimacing;Guarding;Moaning  Pain Intervention(s): Limited activity within patient's tolerance;Monitored during session;Repositioned  Home Living                                          Prior Functioning/Environment              Frequency Min 2X/week     Progress Toward Goals  OT Goals(current goals can now be found in the care plan section)  Progress towards OT goals: Not progressing toward goals - comment (due to increased pain)  Acute Rehab OT Goals Patient Stated Goal: to be able to be normal again OT Goal Formulation: With patient/family Time For Goal Achievement: 02/03/16 Potential to Achieve Goals: Good ADL  Goals Pt Will Perform Grooming: with modified independence;standing Pt Will Perform Upper Body Bathing: with modified independence;sitting;standing Pt Will Perform Lower Body Bathing: with modified independence;sit to/from stand Pt Will Transfer to Toilet: with modified independence;ambulating;bedside commode Pt Will Perform Toileting - Clothing Manipulation and hygiene: with modified independence;sit to/from stand Pt Will Perform Tub/Shower Transfer: Shower transfer;ambulating;3 in 1;with modified independence  Plan Discharge plan needs to be updated    Co-evaluation                 End of Session Equipment Utilized During Treatment: Gait belt;Other (comment) (quad cane)   Activity Tolerance Patient limited by pain   Patient Left in bed;with call bell/phone within reach;with family/visitor present   Nurse Communication Mobility status        Time: [id number] OT Time Calculation (min): 20 min  Charges: OT General Charges $OT Visit: 1 Procedure OT Treatments $Self Care/Home Management : 8-22 mins  Redmond Baseman, OTR/L Pager: [phone] 01/22/2016, 5:39 PM

## 2016-01-22 NOTE — Progress Notes (Signed)
  Echocardiogram 2D Echocardiogram has been performed.  Donata Clay 01/22/2016, 2:00 PM

## 2016-01-22 NOTE — Consult Note (Signed)
   [facility] CM Inpatient Consult   01/22/2016  Meryle Pacifico 1924/10/01 [id number]   Patient screened for [place] Management services. Went to bedside to offer and explain [facility] Care Management program with patient. However, he was up walking with physical therapy outside of the room. Will come back at a later time.    Marthenia Rolling, MSN-Ed, RN,BSN [facility] Liaison [phone]

## 2016-01-22 NOTE — Consult Note (Addendum)
[place] for Infectious Disease       Reason for Consult:Staph aureus bacteremia    Referring Physician: Dr. Aggie Moats  Principal Problem:   Bacteremia Active Problems:   Hyponatremia   Chronic diastolic heart failure (HCC)   Muscle spasms of both lower extremities   Fever, unspecified   Fever   . ALPRAZolam  0.125 mg Oral BID  . amLODipine  2.5 mg Oral Daily  . apixaban  2.5 mg Oral BID  . atorvastatin  20 mg Oral Daily  .  ceFAZolin (ANCEF) IV  2 g Intravenous 3 times per day  . ferrous sulfate  325 mg Oral Q breakfast  . folic acid  1 mg Oral Daily  . multivitamin with minerals  1 tablet Oral Daily  . oxybutynin  15 mg Oral QHS  . pantoprazole  40 mg Oral Daily  . potassium chloride SA  20 mEq Oral Daily  . tamsulosin  0.4 mg Oral Daily  . tiZANidine  2 mg Oral TID  . tiZANidine  2 mg Oral Once  . vancomycin  1,000 mg Intravenous Q24H    Recommendations: MRI lumbar spine (ordered) - will likely need some pain control on-call to MRI to get through it TEE - Dr. Aggie Moats to call cardiology Repeat blood cultures  Will stop zosyn and use cefazolin  Assessment: He has recent onset of low back pain and bilateral leg spasm that has been extremely painful and now with 2/2 blood cultures with Staph aureus concerning for lumbar discitis/osteomyelitis with epidural abscess.  Xrays negative so will proceed with MRI.    Thanks for consult, will follow along.  Antibiotics: Vancomycin and zosyn day 3  HPI: Joseph Garner is a 82 y.o. male with a history of bioprosthetic valve replacement done in 2014 by Dr. Servando Snare, CHF who comes in with worsening leg spasm, weakness and pain in lower back that has been ongoing for about 3 months but really worse over the last 2 weeks.  No fever initially but 100.4 on 3/25 and antibiotics started. Was seen by PCP but no fever and was initially in legs, not back, then [facility] and went back to PCP who advised admission.  No history of  bacteremia.  Typically is ambulatory and active.   xray independently reviewed and noted narrowing L2-3.   Review of Systems:  Constitutional: negative for chills Gastrointestinal: negative for diarrhea Integument/breast: negative for rash All other systems reviewed and are negative   Past Medical History  Diagnosis Date  . BPH (benign prostatic hypertrophy)   . Diverticulosis 2006  . Depression   . Anemia   . Spinal stenosis   . Lung nodules 2010  . Actinic keratosis, hx of   . Macular degeneration   . Hypertension   . Allergy     allergic rhinitis  . Hearing loss     wears bilateral hearing aids  . Inguinal hernia     left side -surgery planned  . GERD (gastroesophageal reflux disease)   . Arthritis     generalized-more back issues  . Severe aortic stenosis 04/16/2013  . Nodule of right lung 05/24/2013  . CHF (congestive heart failure) ([place])   . Shortness of breath   . Atrial flutter ([place])   . Chronic diastolic heart failure ([place]) 08/11/2013    Social History  Substance Use Topics  . Smoking status: Former Smoker -- 1.50 packs/day for 20 years    Types: Cigarettes    Quit date:  10/29/1967  . Smokeless tobacco: Never Used  . Alcohol Use: 0.0 oz/week    0 Standard drinks or equivalent per week     Comment: occasionally    Family History  Problem Relation Age of Onset  . Prostate cancer Father   . Cancer Mother     passed away age 5  . Cancer Father   . Cancer Brother   . Hypertension Brother     No Known Allergies  Physical Exam: Constitutional: alert , moderate distress with pain in legs, spasm, back Filed Vitals:   01/21/16 2037 01/22/16 0548  BP: 121/58 142/64  Pulse: 72 69  Temp: 99 F (37.2 C) 99.3 F (37.4 C)  Resp: 16 18   EYES: anicteric Cardiovascular: Cor RRR and 2/6 Holosystolic murmur Respiratory: CTA B; normal respiratory effort GI: Bowel sounds are normal, liver is not enlarged, spleen is not enlarged Musculoskeletal: no pedal  edema noted Skin: negatives: no rash Hematologic: no cervical lad Neuro: moving all extremities  Lab Results  Component Value Date   WBC 13.3* 01/22/2016   HGB 8.8* 01/22/2016   HCT 25.6* 01/22/2016   MCV 93.8 01/22/2016   PLT 146* 01/22/2016    Lab Results  Component Value Date   CREATININE 1.22 01/22/2016   BUN 27* 01/22/2016   NA 130* 01/22/2016   K 3.7 01/22/2016   CL 99* 01/22/2016   CO2 23 01/22/2016    Lab Results  Component Value Date   ALT 23 01/22/2016   AST 28 01/22/2016   ALKPHOS 54 01/22/2016     Microbiology: Recent Results (from the past 240 hour(s))  MRSA PCR Screening     Status: None   Collection Time: 01/20/16  6:43 AM  Result Value Ref Range Status   MRSA by PCR NEGATIVE NEGATIVE Final    Comment:        The GeneXpert MRSA Assay (FDA approved for NASAL specimens only), is one component of a comprehensive MRSA colonization surveillance program. It is not intended to diagnose MRSA infection nor to guide or monitor treatment for MRSA infections.   Culture, blood (Routine X 2) w Reflex to ID Panel     Status: None (Preliminary result)   Collection Time: 01/20/16  8:50 AM  Result Value Ref Range Status   Specimen Description BLOOD LEFT ANTECUBITAL  Final   Special Requests BOTTLES DRAWN AEROBIC AND ANAEROBIC 10CC  Final   Culture  Setup Time   Final    GRAM POSITIVE COCCI IN CLUSTERS CRITICAL RESULT CALLED TO, READ BACK BY AND VERIFIED WITH: TO SWHITE(RN) BY TCLEVELAND 01/21/16 AT 1:08AM IN BOTH AEROBIC AND ANAEROBIC BOTTLES    Culture STAPHYLOCOCCUS AUREUS  Final   Report Status PENDING  Incomplete  Culture, blood (Routine X 2) w Reflex to ID Panel     Status: None (Preliminary result)   Collection Time: 01/20/16  8:57 AM  Result Value Ref Range Status   Specimen Description BLOOD LEFT FOREARM  Final   Special Requests BOTTLES DRAWN AEROBIC ONLY 10CC  Final   Culture  Setup Time   Final    GRAM POSITIVE COCCI IN CLUSTERS CRITICAL RESULT  CALLED TO, READ BACK BY AND VERIFIED WITH: TO SWHITEHORN(RN) BY TCLEVELAND 01/21/2016 AT 2:33AM AEROBIC BOTTLE ONLY    Culture STAPHYLOCOCCUS AUREUS  Final   Report Status PENDING  Incomplete  Culture, blood (routine x 2)     Status: None (Preliminary result)   Collection Time: 01/22/16  9:13 AM  Result Value Ref  Range Status   Specimen Description BLOOD RIGHT ANTECUBITAL  Final   Special Requests BOTTLES DRAWN AEROBIC AND ANAEROBIC 5CC  Final   Culture PENDING  Incomplete   Report Status PENDING  Incomplete  Culture, blood (routine x 2)     Status: None (Preliminary result)   Collection Time: 01/22/16  9:19 AM  Result Value Ref Range Status   Specimen Description BLOOD BLOOD RIGHT FOREARM  Final   Special Requests BOTTLES DRAWN AEROBIC AND ANAEROBIC 5CC  Final   Culture PENDING  Incomplete   Report Status PENDING  Incomplete    Scharlene Gloss, [place] for Infectious Disease [facility] [email or web address] [phone] pager  [phone] cell 01/22/2016, 11:43 AM

## 2016-01-22 NOTE — Progress Notes (Signed)
Physical Therapy Treatment Patient Details Name: Joseph Garner MRN: [id number] DOB: September 01, 1924 Today's Date: 01/22/2016    History of Present Illness Pt. admitted 01/19/16 with muscle spasms worsening in the past few weeks.  Pt. with h/o CHF, AVR, spinal stenosis.  See physicians note for complete medical history     PT Comments    Patient is progressing toward mobility goals. Recommending RW due to balance deficits and possibility of bilat LE spasms when ambulating.   Follow Up Recommendations  Outpatient PT     Equipment Recommendations  Rolling walker with 5" wheels    Recommendations for Other Services       Precautions / Restrictions Precautions Precautions: Fall Restrictions Weight Bearing Restrictions: No    Mobility  Bed Mobility Overal bed mobility: Needs Assistance (not tested; pt. in recliner) Bed Mobility: Supine to Sit     Supine to sit: Min assist     General bed mobility comments: assist to bring bilat LE to EOB and cues for technique  Transfers Overall transfer level: Needs assistance Equipment used: Rolling walker (2 wheeled) Transfers: Sit to/from Stand Sit to Stand: Supervision         General transfer comment: cues for hand placement   Ambulation/Gait Ambulation/Gait assistance: Supervision Ambulation Distance (Feet): 60 Feet Assistive device: Rolling walker (2 wheeled) Gait Pattern/deviations: Step-through pattern;Decreased stride length;Trunk flexed;Narrow base of support Gait velocity: decreased   General Gait Details: very slow cadence due to anticipation of spasm/pain; pt experienced no spasms during session; cues for posture and position of RW   Stairs            Wheelchair Mobility    Modified Rankin (Stroke Patients Only)       Balance Overall balance assessment: Needs assistance   Sitting balance-Leahy Scale: Good     Standing balance support: Single extremity supported Standing balance-Leahy Scale: Fair                      Cognition Arousal/Alertness: Awake/alert Behavior During Therapy: WFL for tasks assessed/performed Overall Cognitive Status: Within Functional Limits for tasks assessed                      Exercises      General Comments General comments (skin integrity, edema, etc.): max encouragement to participate      Pertinent Vitals/Pain Pain Assessment: Faces Faces Pain Scale: Hurts even more Pain Location: bilat LE with mobility Pain Descriptors / Indicators: Grimacing;Guarding;Moaning Pain Intervention(s): Limited activity within patient's tolerance;Monitored during session;Premedicated before session;Repositioned    Home Living                      Prior Function            PT Goals (current goals can now be found in the care plan section) Acute Rehab PT Goals Patient Stated Goal: rest PT Goal Formulation: With patient Time For Goal Achievement: 01/27/16 Potential to Achieve Goals: Good Progress towards PT goals: Progressing toward goals    Frequency  Min 3X/week    PT Plan Current plan remains appropriate    Co-evaluation PT/OT/SLP Co-Evaluation/Treatment: Yes           End of Session Equipment Utilized During Treatment: Gait belt Activity Tolerance: Patient limited by pain Patient left: with call bell/phone within reach;with family/visitor present;in bed (wife and daughter)     Time: [id number] PT Time Calculation (min) (ACUTE ONLY): 39 min  Charges:  $Gait Training:  23-37 mins $Therapeutic Activity: 8-22 mins                    G Codes:  Functional Assessment Tool Used: clinical observation Functional Limitation: Mobility: Walking and moving around Mobility: Walking and Moving Around Current Status [phone]): At least 1 percent but less than 20 percent impaired, limited or restricted Mobility: Walking and Moving Around Goal Status [phone]): 0 percent impaired, limited or restricted   [place] April,  PTA Pager: [phone]   01/22/2016, 5:20 PM

## 2016-01-22 NOTE — Progress Notes (Signed)
    [facility] has been requested to perform a transesophageal echocardiogram on 01/23/16 for bacteremia.  After careful review of history and examination, the risks and benefits of transesophageal echocardiogram have been explained including risks of esophageal damage, perforation (1:10,000 risk), bleeding, pharyngeal hematoma as well as other potential complications associated with conscious sedation including aspiration, arrhythmia, respiratory failure and death. Alternatives to treatment were discussed, questions were answered. Patient is willing to proceed.  Wife and daughter are also present and agree.   Mica Ramdass R,  01/22/2016 2:06 PM

## 2016-01-22 NOTE — Progress Notes (Signed)
[facility] PROGRESS NOTE  Joseph Garner [id number] DOB: Mar 04, 1924 DOA: 01/19/2016 PCP: Wenda Low, MD  Assessment/Plan:  Hyponatremia  Chronic diastolic heart failure ([place])   1. S. Aureus Bacteremia -  1. Patient had a fever 100.4 3/25. Fever workup revealed bacteremia. Was on vancomycin (3d) and Zosyn (3d stopped), pharmacy to dosed. Pt started on cefazolin by ID (3/27). Blood cultures pos in 2/2 bottles, 3/25. Blood cultures redrawn 3/27. TTE today with TEE tomorrow. ID is followign and their input is appreciated. 2. Muscle spasms of both lower extremities - 1. Patient not complaining of this issue today. States that his legs mostly ache not spasming. 2. Currently admitted his eyedrops may need to convert inpatient 3. PT/OT eval and treat 4. Continue Zanaflex has been increased to 3 times a day. One additional dose for pain control. 3. Chronic diastolic CHF - 1. Continue holding when necessary Lasix, no change in resp status 2. Recheck BMP in AM 4. Hyponatremia - 1. Mild, and appears to be VERY long standing on lab work going all the way back to 2010. Will cont to monitor.   Code Status: Full code Family Communication: Wife (indicate person spoken with, relationship, and if by phone, the number) Disposition Plan: Likely discharge home   Consultants:  None  Procedures:  None  Antibiotics:  Vancomycin and Zosyn-started 01/20/2016  Zosyn stopped 01/22/2016  Cefazolin started 01/22/2016 (indicate start date, and stop date if known)  HPI/Subjective: Afebrile overnight. Patient states his leg pain is back. Eating and drinking well. Denies chills shortness breath cough rash.  Objective: Filed Vitals:   01/21/16 2037 01/22/16 0548  BP: 121/58 142/64  Pulse: 72 69  Temp: 99 F (37.2 C) 99.3 F (37.4 C)  Resp: 16 18    Intake/Output Summary (Last 24 hours) at 01/22/16 1508 Last data filed at 01/22/16 0800  Gross per 24 hour  Intake    240 ml  Output     600 ml  Net   -360 ml   Filed Weights   01/20/16 1500  Weight: 72.6 kg (160 lb 0.9 oz)    Exam:   General:  No diaphoresis, no anxiety, NAD  Cardiovascular: Regular rate and rhythm no MRG  Respiratory: Clear to auscultation bilaterally, normal work of breathing  Abdomen: Nondistended bowel sounds normal, nontender to palpation  Musculoskeletal: No deformity, 5/5 strength throughout all extremities   Data Reviewed: Basic Metabolic Panel:  Recent Labs Lab 01/19/16 0240 01/19/16 1542 01/19/16 1912 01/20/16 0545 01/21/16 0607 01/22/16 0458  NA 129* 130*  --  132* 131* 130*  K 4.1 4.3  --  3.9 3.7 3.7  CL 99* 98*  --  103 101 99*  CO2 23 23  --  20* 23 23  GLUCOSE 120* 111*  --  106* 112* 114*  BUN 21* 21*  --  19 26* 27*  CREATININE 1.02 1.32*  --  1.11 1.36* 1.22  CALCIUM 8.2* 8.5*  --  8.1* 8.0* 7.9*  MG 1.9  --   --   --   --   --   PHOS  --   --  3.4  --   --   --    Liver Function Tests:  Recent Labs Lab 01/19/16 1542 01/22/16 0458  AST 23 28  ALT 16* 23  ALKPHOS 61 54  BILITOT 1.2 0.8  PROT 6.2* 5.2*  ALBUMIN 3.6 2.6*   No results for input(s): LIPASE, AMYLASE in the last 168 hours. No results for input(s):  AMMONIA in the last 168 hours. CBC:  Recent Labs Lab 01/19/16 0240 01/19/16 1542 01/20/16 0545 01/21/16 0607 01/22/16 0458  WBC 15.6* 14.6* 14.1* 17.8* 13.3*  NEUTROABS 11.9* 11.0*  --  14.7* 10.5*  HGB 12.3* 11.4* 9.8* 8.8* 8.8*  HCT 34.3* 33.1* 28.4* 24.7* 25.6*  MCV 93.5 95.4 94.7 94.3 93.8  PLT 184 170 159 133* 146*   Cardiac Enzymes:  Recent Labs Lab 01/19/16 0240  CKTOTAL 94   BNP (last 3 results) No results for input(s): BNP in the last 8760 hours.  ProBNP (last 3 results) No results for input(s): PROBNP in the last 8760 hours.  CBG: No results for input(s): GLUCAP in the last 168 hours.  Recent Results (from the past 240 hour(s))  MRSA PCR Screening     Status: None   Collection Time: 01/20/16  6:43 AM  Result  Value Ref Range Status   MRSA by PCR NEGATIVE NEGATIVE Final    Comment:        The GeneXpert MRSA Assay (FDA approved for NASAL specimens only), is one component of a comprehensive MRSA colonization surveillance program. It is not intended to diagnose MRSA infection nor to guide or monitor treatment for MRSA infections.   Culture, blood (Routine X 2) w Reflex to ID Panel     Status: None (Preliminary result)   Collection Time: 01/20/16  8:50 AM  Result Value Ref Range Status   Specimen Description BLOOD LEFT ANTECUBITAL  Final   Special Requests BOTTLES DRAWN AEROBIC AND ANAEROBIC 10CC  Final   Culture  Setup Time   Final    GRAM POSITIVE COCCI IN CLUSTERS CRITICAL RESULT CALLED TO, READ BACK BY AND VERIFIED WITH: TO SWHITE(RN) BY TCLEVELAND 01/21/16 AT 1:08AM IN BOTH AEROBIC AND ANAEROBIC BOTTLES    Culture STAPHYLOCOCCUS AUREUS  Final   Report Status PENDING  Incomplete  Culture, blood (Routine X 2) w Reflex to ID Panel     Status: None (Preliminary result)   Collection Time: 01/20/16  8:57 AM  Result Value Ref Range Status   Specimen Description BLOOD LEFT FOREARM  Final   Special Requests BOTTLES DRAWN AEROBIC ONLY 10CC  Final   Culture  Setup Time   Final    GRAM POSITIVE COCCI IN CLUSTERS CRITICAL RESULT CALLED TO, READ BACK BY AND VERIFIED WITH: TO SWHITEHORN(RN) BY TCLEVELAND 01/21/2016 AT 2:33AM AEROBIC BOTTLE ONLY    Culture STAPHYLOCOCCUS AUREUS  Final   Report Status PENDING  Incomplete  Culture, blood (routine x 2)     Status: None (Preliminary result)   Collection Time: 01/22/16  9:13 AM  Result Value Ref Range Status   Specimen Description BLOOD RIGHT ANTECUBITAL  Final   Special Requests BOTTLES DRAWN AEROBIC AND ANAEROBIC 5CC  Final   Culture PENDING  Incomplete   Report Status PENDING  Incomplete  Culture, blood (routine x 2)     Status: None (Preliminary result)   Collection Time: 01/22/16  9:19 AM  Result Value Ref Range Status   Specimen Description  BLOOD BLOOD RIGHT FOREARM  Final   Special Requests BOTTLES DRAWN AEROBIC AND ANAEROBIC 5CC  Final   Culture PENDING  Incomplete   Report Status PENDING  Incomplete     Studies: Dg Chest Port 1 View  01/20/2016  CLINICAL DATA:  Fever. EXAM: PORTABLE CHEST 1 VIEW COMPARISON:  09/28/2015 FINDINGS: Stable changes from previous cardiac surgery and valve replacement. Cardiac silhouette is normal in size and configuration. Normal mediastinal and hilar contours. Clear  lungs.  No pleural effusion or pneumothorax. Bony thorax is intact. IMPRESSION: No acute cardiopulmonary disease. Electronically Signed   By: Lajean Manes M.D.   On: 01/20/2016 21:30    Scheduled Meds: . ALPRAZolam  0.125 mg Oral BID  . amLODipine  2.5 mg Oral Daily  . apixaban  2.5 mg Oral BID  . atorvastatin  20 mg Oral Daily  .  ceFAZolin (ANCEF) IV  2 g Intravenous 3 times per day  . ferrous sulfate  325 mg Oral Q breakfast  . folic acid  1 mg Oral Daily  . multivitamin with minerals  1 tablet Oral Daily  . oxybutynin  15 mg Oral QHS  . pantoprazole  40 mg Oral Daily  . potassium chloride SA  20 mEq Oral Daily  . tamsulosin  0.4 mg Oral Daily  . tiZANidine  2 mg Oral TID  . vancomycin  1,000 mg Intravenous Q24H   Continuous Infusions: . sodium chloride 100 mL/hr at 01/22/16 1206    Principal Problem:   Bacteremia Active Problems:   Hyponatremia   Chronic diastolic heart failure (HCC)   Muscle spasms of both lower extremities   Fever, unspecified   Fever   CKD (chronic kidney disease) stage 3, GFR 30-59 ml/min    Time spent: 30 minutes    [facility] Pager [phone]. If 7PM-7AM, please contact night-coverage at [email or web address], password [facility] 01/22/2016, 3:08 PM  LOS: 2 days

## 2016-01-22 NOTE — Discharge Instructions (Signed)
Information on my medicine - ELIQUIS (apixaban)  This medication education was reviewed with me or my healthcare representative as part of my discharge preparation.  The pharmacist that spoke with me during my hospital stay was:  Brain Hilts, [facility]  Why was Eliquis prescribed for you? Eliquis was prescribed for you to reduce the risk of forming blood clots that can cause a stroke if you have a medical condition called atrial fibrillation (a type of irregular heartbeat) OR to reduce the risk of a blood clots forming after orthopedic surgery.  What do You need to know about Eliquis ? Take your Eliquis TWICE DAILY - one tablet in the morning and one tablet in the evening with or without food.  It would be best to take the doses about the same time each day.  If you have difficulty swallowing the tablet whole please discuss with your pharmacist how to take the medication safely.  Take Eliquis exactly as prescribed by your doctor and DO NOT stop taking Eliquis without talking to the doctor who prescribed the medication.  Stopping may increase your risk of developing a new clot or stroke.  Refill your prescription before you run out.  After discharge, you should have regular check-up appointments with your healthcare provider that is prescribing your Eliquis.  In the future your dose may need to be changed if your kidney function or weight changes by a significant amount or as you get older.  What do you do if you miss a dose? If you miss a dose, take it as soon as you remember on the same day and resume taking twice daily.  Do not take more than one dose of ELIQUIS at the same time.  Important Safety Information A possible side effect of Eliquis is bleeding. You should call your healthcare provider right away if you experience any of the following: ? Bleeding from an injury or your nose that does not stop. ? Unusual colored urine (red or dark brown) or unusual colored stools (red or  black). ? Unusual bruising for unknown reasons. ? A serious fall or if you hit your head (even if there is no bleeding).  Some medicines may interact with Eliquis and might increase your risk of bleeding or clotting while on Eliquis. To help avoid this, consult your healthcare provider or pharmacist prior to using any new prescription or non-prescription medications, including herbals, vitamins, non-steroidal anti-inflammatory drugs (NSAIDs) and supplements.  This website has more information on Eliquis (apixaban): [email or web address].

## 2016-01-23 ENCOUNTER — Encounter (HOSPITAL_COMMUNITY): Payer: Self-pay

## 2016-01-23 ENCOUNTER — Encounter (HOSPITAL_COMMUNITY)
Admission: EM | Disposition: A | Discharge status: Home Health | Payer: Self-pay | Source: Home / Self Care | Attending: Internal Medicine

## 2016-01-23 ENCOUNTER — Inpatient Hospital Stay (HOSPITAL_COMMUNITY): Payer: Medicare Other

## 2016-01-23 DIAGNOSIS — M79606 Pain in leg, unspecified: Secondary | ICD-10-CM

## 2016-01-23 DIAGNOSIS — I340 Nonrheumatic mitral (valve) insufficiency: Secondary | ICD-10-CM

## 2016-01-23 DIAGNOSIS — K6812 Psoas muscle abscess: Secondary | ICD-10-CM

## 2016-01-23 DIAGNOSIS — M545 Low back pain, unspecified: Secondary | ICD-10-CM | POA: Diagnosis present

## 2016-01-23 DIAGNOSIS — M4647 Discitis, unspecified, lumbosacral region: Secondary | ICD-10-CM | POA: Insufficient documentation

## 2016-01-23 DIAGNOSIS — M4806 Spinal stenosis, lumbar region: Secondary | ICD-10-CM

## 2016-01-23 HISTORY — PX: TEE WITHOUT CARDIOVERSION: SHX5443

## 2016-01-23 LAB — CBC WITH DIFFERENTIAL/PLATELET
Basophils Absolute: 0 10*3/uL (ref 0.0–0.1)
Basophils Relative: 0 %
EOS ABS: 0.3 10*3/uL (ref 0.0–0.7)
EOS PCT: 3 %
HCT: 24.2 % — ABNORMAL LOW (ref 39.0–52.0)
HEMOGLOBIN: 8.3 g/dL — AB (ref 13.0–17.0)
LYMPHS ABS: 1.6 10*3/uL (ref 0.7–4.0)
LYMPHS PCT: 17 %
MCH: 32.2 pg (ref 26.0–34.0)
MCHC: 34.3 g/dL (ref 30.0–36.0)
MCV: 93.8 fL (ref 78.0–100.0)
MONOS PCT: 10 %
Monocytes Absolute: 1 10*3/uL (ref 0.1–1.0)
NEUTROS PCT: 70 %
Neutro Abs: 6.7 10*3/uL (ref 1.7–7.7)
Platelets: 140 10*3/uL — ABNORMAL LOW (ref 150–400)
RBC: 2.58 MIL/uL — AB (ref 4.22–5.81)
RDW: 13.3 % (ref 11.5–15.5)
WBC: 9.6 10*3/uL (ref 4.0–10.5)

## 2016-01-23 LAB — BASIC METABOLIC PANEL
Anion gap: 5 (ref 5–15)
BUN: 20 mg/dL (ref 6–20)
CO2: 24 mmol/L (ref 22–32)
CREATININE: 1.1 mg/dL (ref 0.61–1.24)
Calcium: 7.5 mg/dL — ABNORMAL LOW (ref 8.9–10.3)
Chloride: 100 mmol/L — ABNORMAL LOW (ref 101–111)
GFR calc Af Amer: >60 mL/min (ref >60)
GFR calc non Af Amer: 57 mL/min — ABNORMAL LOW (ref >60)
GLUCOSE: 108 mg/dL — AB (ref 65–99)
POTASSIUM: 3.5 mmol/L (ref 3.5–5.1)
SODIUM: 129 mmol/L — AB (ref 135–145)

## 2016-01-23 LAB — CULTURE, BLOOD (ROUTINE X 2)

## 2016-01-23 LAB — VANCOMYCIN, TROUGH: VANCOMYCIN TR: 7 ug/mL — AB (ref 10.0–20.0)

## 2016-01-23 SURGERY — ECHOCARDIOGRAM, TRANSESOPHAGEAL
Anesthesia: Moderate Sedation

## 2016-01-23 MED ORDER — BUTAMBEN-TETRACAINE-BENZOCAINE 2-2-14 % EX AERO
INHALATION_SPRAY | CUTANEOUS | Status: DC | PRN
  Administered 2016-01-23: 2 via TOPICAL

## 2016-01-23 MED ORDER — IOPAMIDOL (ISOVUE-300) INJECTION 61%
INTRAVENOUS | Status: AC
  Administered 2016-01-23: 17:00:00
  Filled 2016-01-23: qty 100

## 2016-01-23 MED ORDER — SODIUM CHLORIDE 0.9 % IV SOLN
INTRAVENOUS | Status: DC
  Administered 2016-01-23: 500 mL via INTRAVENOUS

## 2016-01-23 MED ORDER — MIDAZOLAM HCL 10 MG/2ML IJ SOLN
INTRAMUSCULAR | Status: DC | PRN
  Administered 2016-01-23 (×2): 1 mg via INTRAVENOUS

## 2016-01-23 MED ORDER — SODIUM CHLORIDE 0.9 % IV SOLN: INTRAVENOUS | Status: DC

## 2016-01-23 MED ORDER — OXYCODONE-ACETAMINOPHEN 5-325 MG PO TABS
2.0000 | ORAL_TABLET | ORAL | Status: DC | PRN
  Administered 2016-01-23 – 2016-01-24 (×3): 2 via ORAL
  Filled 2016-01-23 (×4): qty 2

## 2016-01-23 MED ORDER — FENTANYL CITRATE (PF) 100 MCG/2ML IJ SOLN
INTRAMUSCULAR | Status: DC | PRN
  Administered 2016-01-23: 25 ug via INTRAVENOUS

## 2016-01-23 MED ORDER — IOHEXOL 300 MG/ML  SOLN
25.0000 mL | INTRAMUSCULAR | Status: AC
  Administered 2016-01-23 (×2): 25 mL via ORAL

## 2016-01-23 MED ORDER — FENTANYL CITRATE (PF) 100 MCG/2ML IJ SOLN
INTRAMUSCULAR | Status: AC
  Filled 2016-01-23: qty 2

## 2016-01-23 MED ORDER — MIDAZOLAM HCL 5 MG/ML IJ SOLN
INTRAMUSCULAR | Status: AC
  Filled 2016-01-23: qty 1

## 2016-01-23 NOTE — Care Management Note (Signed)
Case Management Note  Patient Details  Name: Joseph Garner MRN: [id number] Date of Birth: 09/23/1924  Subjective/Objective:   82 yr old gentleman admitted with leg spasms, has bacteremia.                 Action/Plan:  Case manager spoke with patient, [name], at the bedside concerning need for IV antibiotics at discharge. Daughter states that her dad is the care taker for her mom, and that he will not have support or care at home. They are not at this point interested in SNF for needed IV antibiotics. Joseph Garner, (daughter) states she will take her parents to her home in [place], [place], so that she will be able to care for them. Case manager contacted Sherrell Puller, [facility] liaison for IV infusions with the referral. Case manager faxed information to him [phone]. Patient will get PICC line after cultures are resulted. Joseph Garner states she will get her father an appointment with Dr. [facility] for follow up if needed. Case manager will continue to monitor. Lee's Address: [address] [place], [place] [address]   [phone]  [facility].     Expected Discharge Date:                  Expected Discharge Plan:  [place]  In-House Referral:     Discharge planning Services  CM Consult  Post Acute Care Choice:  Home Health, Durable Medical Equipment Choice offered to:  Patient, Adult Children, Spouse  DME Arranged:  IV pump/equipment DME Agency:     HH Arranged:  RN, IV Antibiotics HH Agency:   ([facility])  Status of Service:  In process, will continue to follow  Medicare Important Message Given:    Date Medicare IM Given:    Medicare IM give by:    Date Additional Medicare IM Given:    Additional Medicare Important Message give by:     If discussed at [place] of Stay Meetings, dates discussed:    Additional Comments:  Ninfa Meeker, RN 01/23/2016, 2:57 PM

## 2016-01-23 NOTE — Progress Notes (Signed)
[facility] PROGRESS NOTE  Joseph Garner [id number] DOB: 1924/05/04 DOA: 01/19/2016 PCP: Wenda Low, MD  Assessment/Plan: 82 y/o male with PMH of HTN, DJD, Spinal Stenosis, CHF, CAD h/p CABG, s/p AVR (bioprosthetic), PAF (Apixaban) presented with leg muscle spasms and found to have bacteremia. Admitted with bacteremia. Underwent TEE (3/28): no vegs. MRI spine showed possible discitis, and psoas abscess. Pend CT abd/pelvis r/o abscess   Bacteremia of unclear etiology. Blood cultures (3/25):staph. Repeat blood cultures (3/27)-pend. Patient underwent TEE (3/28): no vegetations. MRI spine: questionable discitis,osteomyelitis also probable left psoas abscess. Ordered CT abd/pelvis today  -we will cont IV atx, pend repeat cultures. F/u CT abd/pelvis. likely needs prolonged IV atx. Needs PICC if repeat cultures neg in 48 hrs. appreciate ID input   Severe spinal stenosis. DJD, sciatica. Cont pain control. F/u CT abd/pelvis r/o psoas abscess. Obtain PT eval    CAD h/o CABG, s/p AVR (bioprosthetic), PAF (Apixaban). No acute cardiopulmonary symptoms. Cont home regimen . Patient is not on BB (h/o bradycardia). prn lasix. No s/s of fluid overload on exam    Hyponatremia. Chronic in the setting of  CHF, diuretics. Cont monitor    Code Status: full Family Communication: d/w patient, his wife, daughter  (indicate person spoken with, relationship, and if by phone, the number) Disposition Plan: pend work up. Needs IV atx at discharge     Consultants:  ID  Cardiology   Procedures:  TEE  Antibiotics:  Vanc/zosyn 3/25--3/27  Cefazolin 3/27<<<    (indicate start date, and stop date if known)  HPI/Subjective: Alert, no distress, afebrile. s/p TTE. No acute issues   Objective: Filed Vitals:   01/23/16 0845 01/23/16 1032  BP: 152/63 153/63  Pulse: 65   Temp:    Resp: 11     Intake/Output Summary (Last 24 hours) at 01/23/16 1042 Last data filed at 01/23/16 0929  Gross per 24  hour  Intake 2286.67 ml  Output    500 ml  Net 1786.67 ml   Filed Weights   01/20/16 1500  Weight: 72.6 kg (160 lb 0.9 oz)    Exam:   General:  comfortable   Cardiovascular: 99991111 systolic mr  Respiratory: CTA BL  Abdomen: soft, nt,nd   Musculoskeletal: no leg edema    Data Reviewed: Basic Metabolic Panel:  Recent Labs Lab 01/19/16 0240 01/19/16 1542 01/19/16 1912 01/20/16 0545 01/21/16 0607 01/22/16 0458 01/23/16 0547  NA 129* 130*  --  132* 131* 130* 129*  K 4.1 4.3  --  3.9 3.7 3.7 3.5  CL 99* 98*  --  103 101 99* 100*  CO2 23 23  --  20* 23 23 24   GLUCOSE 120* 111*  --  106* 112* 114* 108*  BUN 21* 21*  --  19 26* 27* 20  CREATININE 1.02 1.32*  --  1.11 1.36* 1.22 1.10  CALCIUM 8.2* 8.5*  --  8.1* 8.0* 7.9* 7.5*  MG 1.9  --   --   --   --   --   --   PHOS  --   --  3.4  --   --   --   --    Liver Function Tests:  Recent Labs Lab 01/19/16 1542 01/22/16 0458  AST 23 28  ALT 16* 23  ALKPHOS 61 54  BILITOT 1.2 0.8  PROT 6.2* 5.2*  ALBUMIN 3.6 2.6*   No results for input(s): LIPASE, AMYLASE in the last 168 hours. No results for input(s): AMMONIA in the last 168  hours. CBC:  Recent Labs Lab 01/19/16 0240 01/19/16 1542 01/20/16 0545 01/21/16 0607 01/22/16 0458 01/23/16 0547  WBC 15.6* 14.6* 14.1* 17.8* 13.3* 9.6  NEUTROABS 11.9* 11.0*  --  14.7* 10.5* 6.7  HGB 12.3* 11.4* 9.8* 8.8* 8.8* 8.3*  HCT 34.3* 33.1* 28.4* 24.7* 25.6* 24.2*  MCV 93.5 95.4 94.7 94.3 93.8 93.8  PLT 184 170 159 133* 146* 140*   Cardiac Enzymes:  Recent Labs Lab 01/19/16 0240  CKTOTAL 94   BNP (last 3 results) No results for input(s): BNP in the last 8760 hours.  ProBNP (last 3 results) No results for input(s): PROBNP in the last 8760 hours.  CBG: No results for input(s): GLUCAP in the last 168 hours.  Recent Results (from the past 240 hour(s))  MRSA PCR Screening     Status: None   Collection Time: 01/20/16  6:43 AM  Result Value Ref Range Status    MRSA by PCR NEGATIVE NEGATIVE Final    Comment:        The GeneXpert MRSA Assay (FDA approved for NASAL specimens only), is one component of a comprehensive MRSA colonization surveillance program. It is not intended to diagnose MRSA infection nor to guide or monitor treatment for MRSA infections.   Culture, blood (Routine X 2) w Reflex to ID Panel     Status: None   Collection Time: 01/20/16  8:50 AM  Result Value Ref Range Status   Specimen Description BLOOD LEFT ANTECUBITAL  Final   Special Requests BOTTLES DRAWN AEROBIC AND ANAEROBIC 10CC  Final   Culture  Setup Time   Final    GRAM POSITIVE COCCI IN CLUSTERS CRITICAL RESULT CALLED TO, READ BACK BY AND VERIFIED WITH: TO SWHITE(RN) BY TCLEVELAND 01/21/16 AT 1:08AM IN BOTH AEROBIC AND ANAEROBIC BOTTLES    Culture STAPHYLOCOCCUS AUREUS  Final   Report Status 01/23/2016 FINAL  Final   Organism ID, Bacteria STAPHYLOCOCCUS AUREUS  Final      Susceptibility   Staphylococcus aureus - MIC*    CIPROFLOXACIN <=0.5 SENSITIVE Sensitive     ERYTHROMYCIN <=0.25 SENSITIVE Sensitive     GENTAMICIN <=0.5 SENSITIVE Sensitive     OXACILLIN <=0.25 SENSITIVE Sensitive     TETRACYCLINE <=1 SENSITIVE Sensitive     VANCOMYCIN <=0.5 SENSITIVE Sensitive     TRIMETH/SULFA <=10 SENSITIVE Sensitive     CLINDAMYCIN <=0.25 SENSITIVE Sensitive     RIFAMPIN <=0.5 SENSITIVE Sensitive     Inducible Clindamycin NEGATIVE Sensitive     * STAPHYLOCOCCUS AUREUS  Culture, blood (Routine X 2) w Reflex to ID Panel     Status: None   Collection Time: 01/20/16  8:57 AM  Result Value Ref Range Status   Specimen Description BLOOD LEFT FOREARM  Final   Special Requests BOTTLES DRAWN AEROBIC ONLY 10CC  Final   Culture  Setup Time   Final    GRAM POSITIVE COCCI IN CLUSTERS CRITICAL RESULT CALLED TO, READ BACK BY AND VERIFIED WITH: TO SWHITEHORN(RN) BY TCLEVELAND 01/21/2016 AT 2:33AM AEROBIC BOTTLE ONLY    Culture   Final    STAPHYLOCOCCUS AUREUS SUSCEPTIBILITIES  PERFORMED ON PREVIOUS CULTURE WITHIN THE LAST 5 DAYS.    Report Status 01/23/2016 FINAL  Final  Culture, blood (routine x 2)     Status: None (Preliminary result)   Collection Time: 01/22/16  9:13 AM  Result Value Ref Range Status   Specimen Description BLOOD RIGHT ANTECUBITAL  Final   Special Requests BOTTLES DRAWN AEROBIC AND ANAEROBIC 5CC  Final  Culture PENDING  Incomplete   Report Status PENDING  Incomplete  Culture, blood (routine x 2)     Status: None (Preliminary result)   Collection Time: 01/22/16  9:19 AM  Result Value Ref Range Status   Specimen Description BLOOD BLOOD RIGHT FOREARM  Final   Special Requests BOTTLES DRAWN AEROBIC AND ANAEROBIC 5CC  Final   Culture PENDING  Incomplete   Report Status PENDING  Incomplete     Studies: Mr Lumbar Spine W Wo Contrast  01/22/2016  CLINICAL DATA:  Worsening low back pain, leg spasms, and weakness. Symptoms present for 3 months but worse over the last 2 weeks. Fever and bacteremia. EXAM: MRI LUMBAR SPINE WITHOUT AND WITH CONTRAST TECHNIQUE: Multiplanar and multiecho pulse sequences of the lumbar spine were obtained without and with intravenous contrast. CONTRAST:  11mL MULTIHANCE GADOBENATE DIMEGLUMINE 529 MG/ML IV SOLN COMPARISON:  Lumbar spine MRI 08/20/2008. CTA chest, abdomen, and pelvis 05/20/2013. FINDINGS: Transitional lumbosacral anatomy is again noted. Numbering employed on the prior MRI will be continued on the current examination, with S1 considered partially lumbarized and with a relatively fully formed S1-2 disc space present. There is mild lumbar levoscoliosis. There is trace retrolisthesis of L2 on L3. Vertebral body heights are preserved. Moderate disc space narrowing at L2-3 is new from the prior MRI but was present on the more recent CTA. There is a small Schmorl's node type deformity involving the L2 inferior endplate which may have slightly increased in size from the prior CTA but is not completely new. There is  progressive, moderate disc space narrowing at L4-5. Mild STIR hyperintensity and mild endplate edema and enhancement are present at both L2-3 and L4-5 without frank endplate destructive changes identified. There is rounded/tubular low signal intensity on both axial T1 and T2 and coronal T1 sequences centrally in the left psoas muscle, with mild enhancement along its superior aspect. This measures up to 1.6 cm in diameter on axial images. There is mild-to-moderate marrow and surrounding soft tissue edema about the L3-4 and L4-5 facet joints, greater on the right. Advanced facet arthrosis was demonstrated on the prior CTA. An L4 vertebral body hemangioma is noted. The conus medullaris is normal in signal and terminates at L2. A small cyst is noted in the lower pole of the right kidney. L1-2: Small left central disc extrusion appears slightly smaller than on the prior study. There is also mild disc bulging and mild facet hypertrophy. No significant stenosis. L2-3: New circumferential disc bulging and mild facet and ligamentum flavum hypertrophy result in mild bilateral neural foraminal narrowing without significant spinal stenosis. L3-4: Disc bulging asymmetric to the right, ligamentum flavum hypertrophy, and moderate to severe right and mild-to-moderate left facet hypertrophy result in new mild spinal stenosis, mild right greater than left lateral recess stenosis, and mild right neural foraminal stenosis. L4-5: There is advanced bilateral facet arthrosis, right worse than left and progressive from the prior MRI. There is focal lobe, heterogeneous T2 hyperintensity in the dorsal epidural space medial to the right L4-5 facet joint just below the L4-5 disc space level which appears partially rim enhancing and slightly extends superiorly in the midline dorsal epidural space at/ just above the disc space level. In total this measures approximately 2 x 1 cm. A small amount of a amorphous T2 hyperintensity was present at the  medial aspect of the right facet joint on the prior MRI. The advanced facet arthrosis, ligamentum flavum hypertrophy, dorsal epidural material comment circumferential disc bulging result in increased,  severe spinal stenosis and moderate right neural foraminal stenosis. Small bilateral facet joint effusions. L5-S1: There is progressive, severe bilateral facet arthrosis with small bilateral joint effusions. Progressive facet arthrosis, ligamentum flavum hypertrophy, and disc bulging result in increased, moderately severe spinal stenosis, left greater than right lateral recess stenosis, and mild right and moderate left neural foraminal stenosis. A 2-3 mm synovial cyst is again seen extending anteriorly from the left L5-S1 facet joint into the superior aspect of the neural foramen. Small bilateral facet joint effusions. S1-2:  No disc herniation or stenosis. IMPRESSION: 1. Mild endplate edema and enhancement at L2-3 and L4-5. While early discitis/osteomyelitis is possible, no frank endplate destruction is seen and these changes may be degenerative rather than infectious. 2. Advanced facet arthrosis from L3-4 to L5-S1. Right greater left perifacet edema may reflect acute on chronic facet arthritis with sterile, small facet joint effusions at each level, however septic arthritis is not excluded. 3. Progressive disc and facet degeneration at L4-5 resulting in severe multifactorial spinal stenosis. 2 x 1 cm peripherally enhancing structure in the dorsal epidural space at this level which may represent interval enlargement of a tiny synovial cyst from the prior study, although a small epidural abscess is also possible. 4. Increased, moderately severe spinal stenosis at L5-S1. 5. New, mild spinal stenosis at L3-4. 6. Tubular low signal intensity focus centrally in the left psoas muscle. This is of uncertain etiology, and while it does not demonstrate fluid signal to clearly indicate an abscess, further assessment with abdomen  and pelvis CT (with IV contrast) may be warranted to exclude a fluid collection. Electronically Signed   By: Logan Bores M.D.   On: 01/22/2016 21:13    Scheduled Meds: . ALPRAZolam  0.125 mg Oral BID  . amLODipine  2.5 mg Oral Daily  . apixaban  2.5 mg Oral BID  . atorvastatin  20 mg Oral Daily  .  ceFAZolin (ANCEF) IV  2 g Intravenous 3 times per day  . ferrous sulfate  325 mg Oral Q breakfast  . folic acid  1 mg Oral Daily  . multivitamin with minerals  1 tablet Oral Daily  . oxybutynin  15 mg Oral QHS  . pantoprazole  40 mg Oral Daily  . potassium chloride SA  20 mEq Oral Daily  . tamsulosin  0.4 mg Oral Daily  . tiZANidine  2 mg Oral TID   Continuous Infusions:   Principal Problem:   Bacteremia Active Problems:   Hyponatremia   Chronic diastolic heart failure (HCC)   Muscle spasms of both lower extremities   Fever, unspecified   Fever   CKD (chronic kidney disease) stage 3, GFR 30-59 ml/min    Time spent: >35 minutes     Kinnie Feil  [facility] Pager [phone]. If 7PM-7AM, please contact night-coverage at [email or web address], password [facility] 01/23/2016, 10:42 AM  LOS: 3 days

## 2016-01-23 NOTE — Progress Notes (Signed)
Verbal order for Oxycodone 5mg /325mg  Q4H for pain. Per Dr Daleen Bo

## 2016-01-23 NOTE — Interval H&P Note (Signed)
History and Physical Interval Note:  01/23/2016 8:16 AM  Joseph Garner  has presented today for surgery, with the diagnosis of bacteremia  The various methods of treatment have been discussed with the patient and family. After consideration of risks, benefits and other options for treatment, the patient has consented to  Procedure(s): TRANSESOPHAGEAL ECHOCARDIOGRAM (TEE) (N/A) as a surgical intervention .  The patient's history has been reviewed, patient examined, no change in status, stable for surgery.  I have reviewed the patient's chart and labs.  Questions were answered to the patient's satisfaction.     Garyson Stelly [employer]

## 2016-01-23 NOTE — Progress Notes (Signed)
Pt is in so much pain when he is touched or more he states its cramps in his legs and body Dr notified ordered oxy 5/325mg  for pain.  Pt is still complaining of these cramps.  Daughter very concern stating a week ago this man was driving a car and doing errands and how bad he is since last Friday.  "tating something is wrong"

## 2016-01-23 NOTE — Progress Notes (Signed)
  Echocardiogram Echocardiogram Transesophageal has been performed.  Darlina Sicilian M 01/23/2016, 8:46 AM

## 2016-01-23 NOTE — Progress Notes (Signed)
Called Dr Marcelline Deist regarding diet for the pt,  He ordered a regular diet verbally

## 2016-01-23 NOTE — Progress Notes (Signed)
[facility]   Reason for visit: Follow up on Staph aureus bacteremia  Interval History: MRI done and no noted infection, though concern of psoas fluid collection and recommended CT.  TEE without vegetation.  MRI with stenosis and other findings.  Afebrile.  Pain somewhat better.   MRI independently reviewed   Physical Exam: Constitutional:  Filed Vitals:   01/23/16 0845 01/23/16 1032  BP: 152/63 153/63  Pulse: 65   Temp:    Resp: 11    patient appears in less distress due to pain Eyes: anicteric HENT: no thrush Respiratory: Normal respiratory effort; CTA B Cardiovascular: RRR Flank: no flank tenderness  Review of Systems: Constitutional: negative for fevers and chills Gastrointestinal: negative for diarrhea Integument/breast: negative for rash  Lab Results  Component Value Date   WBC 9.6 01/23/2016   HGB 8.3* 01/23/2016   HCT 24.2* 01/23/2016   MCV 93.8 01/23/2016   PLT 140* 01/23/2016    Lab Results  Component Value Date   CREATININE 1.10 01/23/2016   BUN 20 01/23/2016   NA 129* 01/23/2016   K 3.5 01/23/2016   CL 100* 01/23/2016   CO2 24 01/23/2016    Lab Results  Component Value Date   ALT 23 01/22/2016   AST 28 01/22/2016   ALKPHOS 54 01/22/2016     Microbiology: Recent Results (from the past 240 hour(s))  MRSA PCR Screening     Status: None   Collection Time: 01/20/16  6:43 AM  Result Value Ref Range Status   MRSA by PCR NEGATIVE NEGATIVE Final    Comment:        The GeneXpert MRSA Assay (FDA approved for NASAL specimens only), is one component of a comprehensive MRSA colonization surveillance program. It is not intended to diagnose MRSA infection nor to guide or monitor treatment for MRSA infections.   Culture, blood (Routine X 2) w Reflex to ID Panel     Status: None   Collection Time: 01/20/16  8:50 AM  Result Value Ref Range Status   Specimen Description BLOOD LEFT ANTECUBITAL  Final   Special Requests  BOTTLES DRAWN AEROBIC AND ANAEROBIC 10CC  Final   Culture  Setup Time   Final    GRAM POSITIVE COCCI IN CLUSTERS CRITICAL RESULT CALLED TO, READ BACK BY AND VERIFIED WITH: TO SWHITE(RN) BY TCLEVELAND 01/21/16 AT 1:08AM IN BOTH AEROBIC AND ANAEROBIC BOTTLES    Culture STAPHYLOCOCCUS AUREUS  Final   Report Status 01/23/2016 FINAL  Final   Organism ID, Bacteria STAPHYLOCOCCUS AUREUS  Final      Susceptibility   Staphylococcus aureus - MIC*    CIPROFLOXACIN <=0.5 SENSITIVE Sensitive     ERYTHROMYCIN <=0.25 SENSITIVE Sensitive     GENTAMICIN <=0.5 SENSITIVE Sensitive     OXACILLIN <=0.25 SENSITIVE Sensitive     TETRACYCLINE <=1 SENSITIVE Sensitive     VANCOMYCIN <=0.5 SENSITIVE Sensitive     TRIMETH/SULFA <=10 SENSITIVE Sensitive     CLINDAMYCIN <=0.25 SENSITIVE Sensitive     RIFAMPIN <=0.5 SENSITIVE Sensitive     Inducible Clindamycin NEGATIVE Sensitive     * STAPHYLOCOCCUS AUREUS  Culture, blood (Routine X 2) w Reflex to ID Panel     Status: None   Collection Time: 01/20/16  8:57 AM  Result Value Ref Range Status   Specimen Description BLOOD LEFT FOREARM  Final   Special Requests BOTTLES DRAWN AEROBIC ONLY 10CC  Final   Culture  Setup Time   Final  GRAM POSITIVE COCCI IN CLUSTERS CRITICAL RESULT CALLED TO, READ BACK BY AND VERIFIED WITH: TO SWHITEHORN(RN) BY TCLEVELAND 01/21/2016 AT 2:33AM AEROBIC BOTTLE ONLY    Culture   Final    STAPHYLOCOCCUS AUREUS SUSCEPTIBILITIES PERFORMED ON PREVIOUS CULTURE WITHIN THE LAST 5 DAYS.    Report Status 01/23/2016 FINAL  Final  Culture, blood (routine x 2)     Status: None (Preliminary result)   Collection Time: 01/22/16  9:13 AM  Result Value Ref Range Status   Specimen Description BLOOD RIGHT ANTECUBITAL  Final   Special Requests BOTTLES DRAWN AEROBIC AND ANAEROBIC 5CC  Final   Culture PENDING  Incomplete   Report Status PENDING  Incomplete  Culture, blood (routine x 2)     Status: None (Preliminary result)   Collection Time: 01/22/16   9:19 AM  Result Value Ref Range Status   Specimen Description BLOOD BLOOD RIGHT FOREARM  Final   Special Requests BOTTLES DRAWN AEROBIC AND ANAEROBIC 5CC  Final   Culture PENDING  Incomplete   Report Status PENDING  Incomplete    Impression/Plan:  1. Staph aureus bacteremia - MSSA so now on cefazolin alone.  No source yet, will check CT abd/pelvis for ? Psoas abscess.  If not that, no source identified.  If abscess, will need IR drainage.  TEE negative.  Repeat blood cultures sent today, will need to show clearance before putting in picc line Will engage home health Will need at least 2 weeks of IV cefazolin, longer if abscess noted and with some possible inflammation on MRI, may extend it some.   Discussed with patient, daughter and wife at bedside.   2.  Leg pain - ? From stenosis or abscess.    3. Stenosis - worsening L4-5, new L2-3

## 2016-01-23 NOTE — H&P (View-Only) (Signed)
[facility] PROGRESS NOTE  Stefen Jaquay [id number] DOB: Jun 09, 1924 DOA: 01/19/2016 PCP: Wenda Low, MD  Assessment/Plan:  Hyponatremia  Chronic diastolic heart failure ([place])   1. S. Aureus Bacteremia -  1. Patient had a fever 100.4 3/25. Fever workup revealed bacteremia. Was on vancomycin (3d) and Zosyn (3d stopped), pharmacy to dosed. Pt started on cefazolin by ID (3/27). Blood cultures pos in 2/2 bottles, 3/25. Blood cultures redrawn 3/27. TTE today with TEE tomorrow. ID is followign and their input is appreciated. 2. Muscle spasms of both lower extremities - 1. Patient not complaining of this issue today. States that his legs mostly ache not spasming. 2. Currently admitted his eyedrops may need to convert inpatient 3. PT/OT eval and treat 4. Continue Zanaflex has been increased to 3 times a day. One additional dose for pain control. 3. Chronic diastolic CHF - 1. Continue holding when necessary Lasix, no change in resp status 2. Recheck BMP in AM 4. Hyponatremia - 1. Mild, and appears to be VERY long standing on lab work going all the way back to 2010. Will cont to monitor.   Code Status: Full code Family Communication: Wife (indicate person spoken with, relationship, and if by phone, the number) Disposition Plan: Likely discharge home   Consultants:  None  Procedures:  None  Antibiotics:  Vancomycin and Zosyn-started 01/20/2016  Zosyn stopped 01/22/2016  Cefazolin started 01/22/2016 (indicate start date, and stop date if known)  HPI/Subjective: Afebrile overnight. Patient states his leg pain is back. Eating and drinking well. Denies chills shortness breath cough rash.  Objective: Filed Vitals:   01/21/16 2037 01/22/16 0548  BP: 121/58 142/64  Pulse: 72 69  Temp: 99 F (37.2 C) 99.3 F (37.4 C)  Resp: 16 18    Intake/Output Summary (Last 24 hours) at 01/22/16 1508 Last data filed at 01/22/16 0800  Gross per 24 hour  Intake    240 ml  Output     600 ml  Net   -360 ml   Filed Weights   01/20/16 1500  Weight: 72.6 kg (160 lb 0.9 oz)    Exam:   General:  No diaphoresis, no anxiety, NAD  Cardiovascular: Regular rate and rhythm no MRG  Respiratory: Clear to auscultation bilaterally, normal work of breathing  Abdomen: Nondistended bowel sounds normal, nontender to palpation  Musculoskeletal: No deformity, 5/5 strength throughout all extremities   Data Reviewed: Basic Metabolic Panel:  Recent Labs Lab 01/19/16 0240 01/19/16 1542 01/19/16 1912 01/20/16 0545 01/21/16 0607 01/22/16 0458  NA 129* 130*  --  132* 131* 130*  K 4.1 4.3  --  3.9 3.7 3.7  CL 99* 98*  --  103 101 99*  CO2 23 23  --  20* 23 23  GLUCOSE 120* 111*  --  106* 112* 114*  BUN 21* 21*  --  19 26* 27*  CREATININE 1.02 1.32*  --  1.11 1.36* 1.22  CALCIUM 8.2* 8.5*  --  8.1* 8.0* 7.9*  MG 1.9  --   --   --   --   --   PHOS  --   --  3.4  --   --   --    Liver Function Tests:  Recent Labs Lab 01/19/16 1542 01/22/16 0458  AST 23 28  ALT 16* 23  ALKPHOS 61 54  BILITOT 1.2 0.8  PROT 6.2* 5.2*  ALBUMIN 3.6 2.6*   No results for input(s): LIPASE, AMYLASE in the last 168 hours. No results for input(s):  AMMONIA in the last 168 hours. CBC:  Recent Labs Lab 01/19/16 0240 01/19/16 1542 01/20/16 0545 01/21/16 0607 01/22/16 0458  WBC 15.6* 14.6* 14.1* 17.8* 13.3*  NEUTROABS 11.9* 11.0*  --  14.7* 10.5*  HGB 12.3* 11.4* 9.8* 8.8* 8.8*  HCT 34.3* 33.1* 28.4* 24.7* 25.6*  MCV 93.5 95.4 94.7 94.3 93.8  PLT 184 170 159 133* 146*   Cardiac Enzymes:  Recent Labs Lab 01/19/16 0240  CKTOTAL 94   BNP (last 3 results) No results for input(s): BNP in the last 8760 hours.  ProBNP (last 3 results) No results for input(s): PROBNP in the last 8760 hours.  CBG: No results for input(s): GLUCAP in the last 168 hours.  Recent Results (from the past 240 hour(s))  MRSA PCR Screening     Status: None   Collection Time: 01/20/16  6:43 AM  Result  Value Ref Range Status   MRSA by PCR NEGATIVE NEGATIVE Final    Comment:        The GeneXpert MRSA Assay (FDA approved for NASAL specimens only), is one component of a comprehensive MRSA colonization surveillance program. It is not intended to diagnose MRSA infection nor to guide or monitor treatment for MRSA infections.   Culture, blood (Routine X 2) w Reflex to ID Panel     Status: None (Preliminary result)   Collection Time: 01/20/16  8:50 AM  Result Value Ref Range Status   Specimen Description BLOOD LEFT ANTECUBITAL  Final   Special Requests BOTTLES DRAWN AEROBIC AND ANAEROBIC 10CC  Final   Culture  Setup Time   Final    GRAM POSITIVE COCCI IN CLUSTERS CRITICAL RESULT CALLED TO, READ BACK BY AND VERIFIED WITH: TO SWHITE(RN) BY TCLEVELAND 01/21/16 AT 1:08AM IN BOTH AEROBIC AND ANAEROBIC BOTTLES    Culture STAPHYLOCOCCUS AUREUS  Final   Report Status PENDING  Incomplete  Culture, blood (Routine X 2) w Reflex to ID Panel     Status: None (Preliminary result)   Collection Time: 01/20/16  8:57 AM  Result Value Ref Range Status   Specimen Description BLOOD LEFT FOREARM  Final   Special Requests BOTTLES DRAWN AEROBIC ONLY 10CC  Final   Culture  Setup Time   Final    GRAM POSITIVE COCCI IN CLUSTERS CRITICAL RESULT CALLED TO, READ BACK BY AND VERIFIED WITH: TO SWHITEHORN(RN) BY TCLEVELAND 01/21/2016 AT 2:33AM AEROBIC BOTTLE ONLY    Culture STAPHYLOCOCCUS AUREUS  Final   Report Status PENDING  Incomplete  Culture, blood (routine x 2)     Status: None (Preliminary result)   Collection Time: 01/22/16  9:13 AM  Result Value Ref Range Status   Specimen Description BLOOD RIGHT ANTECUBITAL  Final   Special Requests BOTTLES DRAWN AEROBIC AND ANAEROBIC 5CC  Final   Culture PENDING  Incomplete   Report Status PENDING  Incomplete  Culture, blood (routine x 2)     Status: None (Preliminary result)   Collection Time: 01/22/16  9:19 AM  Result Value Ref Range Status   Specimen Description  BLOOD BLOOD RIGHT FOREARM  Final   Special Requests BOTTLES DRAWN AEROBIC AND ANAEROBIC 5CC  Final   Culture PENDING  Incomplete   Report Status PENDING  Incomplete     Studies: Dg Chest Port 1 View  01/20/2016  CLINICAL DATA:  Fever. EXAM: PORTABLE CHEST 1 VIEW COMPARISON:  09/28/2015 FINDINGS: Stable changes from previous cardiac surgery and valve replacement. Cardiac silhouette is normal in size and configuration. Normal mediastinal and hilar contours. Clear  lungs.  No pleural effusion or pneumothorax. Bony thorax is intact. IMPRESSION: No acute cardiopulmonary disease. Electronically Signed   By: Lajean Manes M.D.   On: 01/20/2016 21:30    Scheduled Meds: . ALPRAZolam  0.125 mg Oral BID  . amLODipine  2.5 mg Oral Daily  . apixaban  2.5 mg Oral BID  . atorvastatin  20 mg Oral Daily  .  ceFAZolin (ANCEF) IV  2 g Intravenous 3 times per day  . ferrous sulfate  325 mg Oral Q breakfast  . folic acid  1 mg Oral Daily  . multivitamin with minerals  1 tablet Oral Daily  . oxybutynin  15 mg Oral QHS  . pantoprazole  40 mg Oral Daily  . potassium chloride SA  20 mEq Oral Daily  . tamsulosin  0.4 mg Oral Daily  . tiZANidine  2 mg Oral TID  . vancomycin  1,000 mg Intravenous Q24H   Continuous Infusions: . sodium chloride 100 mL/hr at 01/22/16 1206    Principal Problem:   Bacteremia Active Problems:   Hyponatremia   Chronic diastolic heart failure (HCC)   Muscle spasms of both lower extremities   Fever, unspecified   Fever   CKD (chronic kidney disease) stage 3, GFR 30-59 ml/min    Time spent: 30 minutes    [facility] Pager [phone]. If 7PM-7AM, please contact night-coverage at [email or web address], password [facility] 01/22/2016, 3:08 PM  LOS: 2 days

## 2016-01-23 NOTE — CV Procedure (Signed)
Procedure: TEE  Sedation: Versed 2 mg IV, Fentanyl 25 mcg IV  Findings: Please see echo section for full report.  Normal LV size with mild LV hypertrophy.  EF 55%.  Mildly dilated RV with normal systolic function.  Mild left atrial enlargement, mild right atrial enlargement.  Atrial septum intact without evidence for PFO or ASD.  Mild to moderate TR, peak RV-RA gradient 31 mmHg.  Bioprosthetic aortic valve appeared to function normally, no significant regurgitation, mean gradient 13 mmHg.  Mild mitral regurgitation.  Normal pulmonic valve.  No vegetation was seen on any of the valves.  Mildly dilated ascending aorta to 4.2 cm.  Mild plaque in descending thoracic aorta.   Impression: No evidence for endocarditis.   Loralie Champagne 01/23/2016 8:32 AM

## 2016-01-24 ENCOUNTER — Other Ambulatory Visit: Payer: Self-pay | Admitting: *Deleted

## 2016-01-24 DIAGNOSIS — E871 Hypo-osmolality and hyponatremia: Secondary | ICD-10-CM

## 2016-01-24 DIAGNOSIS — R7881 Bacteremia: Principal | ICD-10-CM

## 2016-01-24 DIAGNOSIS — M4647 Discitis, unspecified, lumbosacral region: Secondary | ICD-10-CM

## 2016-01-24 DIAGNOSIS — I5032 Chronic diastolic (congestive) heart failure: Secondary | ICD-10-CM

## 2016-01-24 DIAGNOSIS — N183 Chronic kidney disease, stage 3 (moderate): Secondary | ICD-10-CM

## 2016-01-24 DIAGNOSIS — M62838 Other muscle spasm: Secondary | ICD-10-CM

## 2016-01-24 DIAGNOSIS — R509 Fever, unspecified: Secondary | ICD-10-CM

## 2016-01-24 LAB — BASIC METABOLIC PANEL
Anion gap: 7 (ref 5–15)
Anion gap: 10 (ref 5–15)
BUN: 14 mg/dL (ref 6–20)
BUN: 13 mg/dL (ref 6–20)
CHLORIDE: 95 mmol/L — AB (ref 101–111)
CHLORIDE: 95 mmol/L — AB (ref 101–111)
CO2: 25 mmol/L (ref 22–32)
CO2: 23 mmol/L (ref 22–32)
CREATININE: 0.92 mg/dL (ref 0.61–1.24)
Calcium: 7.7 mg/dL — ABNORMAL LOW (ref 8.9–10.3)
Calcium: 8.1 mg/dL — ABNORMAL LOW (ref 8.9–10.3)
Creatinine, Ser: 1 mg/dL (ref 0.61–1.24)
GFR calc Af Amer: >60 mL/min (ref >60)
GFR calc non Af Amer: >60 mL/min (ref >60)
GLUCOSE: 100 mg/dL — AB (ref 65–99)
GLUCOSE: 106 mg/dL — AB (ref 65–99)
POTASSIUM: 3.8 mmol/L (ref 3.5–5.1)
Potassium: 3.6 mmol/L (ref 3.5–5.1)
Sodium: 127 mmol/L — ABNORMAL LOW (ref 135–145)
Sodium: 128 mmol/L — ABNORMAL LOW (ref 135–145)

## 2016-01-24 LAB — CBC
HCT: 27.1 % — ABNORMAL LOW (ref 39.0–52.0)
Hemoglobin: 9.3 g/dL — ABNORMAL LOW (ref 13.0–17.0)
MCH: 32.2 pg (ref 26.0–34.0)
MCHC: 34.3 g/dL (ref 30.0–36.0)
MCV: 93.8 fL (ref 78.0–100.0)
PLATELETS: 181 10*3/uL (ref 150–400)
RBC: 2.89 MIL/uL — AB (ref 4.22–5.81)
RDW: 13 % (ref 11.5–15.5)
WBC: 9.8 10*3/uL (ref 4.0–10.5)

## 2016-01-24 LAB — SODIUM
SODIUM: 127 mmol/L — AB (ref 135–145)
SODIUM: 129 mmol/L — AB (ref 135–145)

## 2016-01-24 MED ORDER — FAMOTIDINE 20 MG PO TABS
20.0000 mg | ORAL_TABLET | Freq: Two times a day (BID) | ORAL | Status: DC
  Administered 2016-01-24 – 2016-01-29 (×11): 20 mg via ORAL
  Filled 2016-01-24 (×11): qty 1

## 2016-01-24 MED ORDER — HYDROCORTISONE 0.5 % EX CREA
TOPICAL_CREAM | Freq: Two times a day (BID) | CUTANEOUS | Status: DC
Start: 2016-01-24 — End: 2016-01-29
  Administered 2016-01-24: 1 via TOPICAL
  Administered 2016-01-25 – 2016-01-28 (×5): via TOPICAL
  Filled 2016-01-24: qty 28.35

## 2016-01-24 MED ORDER — SODIUM CHLORIDE 0.9 % IV SOLN
INTRAVENOUS | Status: DC
  Administered 2016-01-25 (×2): via INTRAVENOUS

## 2016-01-24 MED ORDER — FERROUS SULFATE 325 (65 FE) MG PO TABS
325.0000 mg | ORAL_TABLET | Freq: Two times a day (BID) | ORAL | Status: DC
  Administered 2016-01-25 – 2016-01-29 (×8): 325 mg via ORAL
  Filled 2016-01-24 (×8): qty 1

## 2016-01-24 MED ORDER — CALAMINE EX LOTN
TOPICAL_LOTION | CUTANEOUS | Status: DC | PRN
  Filled 2016-01-24: qty 118

## 2016-01-24 NOTE — Care Management Important Message (Signed)
Important Message  Patient Details  Name: Joseph Garner MRN: [id number] Date of Birth: 1924-02-14   Medicare Important Message Given:  Yes    [name] 01/24/2016, 10:31 AM

## 2016-01-24 NOTE — Progress Notes (Signed)
[facility] for Infectious Disease   Reason for visit: Follow up on Staph aureus bacteremia  Interval History: Fever to 101.  Repeat blood cultures remain negative. Still with significant pain but some relief.   CT independently reviewed and I do not see any discreet abscess  Physical Exam: Constitutional:  Filed Vitals:   01/24/16 0100 01/24/16 0500  BP: 144/80 149/61  Pulse: 90 58  Temp: 99.4 F (37.4 C) 97.8 F (36.6 C)  Resp: 18 18   patient appears in less distress due to pain Eyes: anicteric HENT: no thrush Respiratory: Normal respiratory effort; CTA B Cardiovascular: RRR Flank: no flank tenderness  Review of Systems: Constitutional: negative for fevers and chills Gastrointestinal: negative for diarrhea Integument/breast: negative for rash  Lab Results  Component Value Date   WBC 9.8 01/24/2016   HGB 9.3* 01/24/2016   HCT 27.1* 01/24/2016   MCV 93.8 01/24/2016   PLT 181 01/24/2016    Lab Results  Component Value Date   CREATININE 0.92 01/24/2016   BUN 14 01/24/2016   NA 127* 01/24/2016   K 3.6 01/24/2016   CL 95* 01/24/2016   CO2 25 01/24/2016    Lab Results  Component Value Date   ALT 23 01/22/2016   AST 28 01/22/2016   ALKPHOS 54 01/22/2016     Microbiology: Recent Results (from the past 240 hour(s))  MRSA PCR Screening     Status: None   Collection Time: 01/20/16  6:43 AM  Result Value Ref Range Status   MRSA by PCR NEGATIVE NEGATIVE Final    Comment:        The GeneXpert MRSA Assay (FDA approved for NASAL specimens only), is one component of a comprehensive MRSA colonization surveillance program. It is not intended to diagnose MRSA infection nor to guide or monitor treatment for MRSA infections.   Culture, blood (Routine X 2) w Reflex to ID Panel     Status: None   Collection Time: 01/20/16  8:50 AM  Result Value Ref Range Status   Specimen Description BLOOD LEFT ANTECUBITAL  Final   Special Requests BOTTLES DRAWN AEROBIC  AND ANAEROBIC 10CC  Final   Culture  Setup Time   Final    GRAM POSITIVE COCCI IN CLUSTERS CRITICAL RESULT CALLED TO, READ BACK BY AND VERIFIED WITH: TO SWHITE(RN) BY TCLEVELAND 01/21/16 AT 1:08AM IN BOTH AEROBIC AND ANAEROBIC BOTTLES    Culture STAPHYLOCOCCUS AUREUS  Final   Report Status 01/23/2016 FINAL  Final   Organism ID, Bacteria STAPHYLOCOCCUS AUREUS  Final      Susceptibility   Staphylococcus aureus - MIC*    CIPROFLOXACIN <=0.5 SENSITIVE Sensitive     ERYTHROMYCIN <=0.25 SENSITIVE Sensitive     GENTAMICIN <=0.5 SENSITIVE Sensitive     OXACILLIN <=0.25 SENSITIVE Sensitive     TETRACYCLINE <=1 SENSITIVE Sensitive     VANCOMYCIN <=0.5 SENSITIVE Sensitive     TRIMETH/SULFA <=10 SENSITIVE Sensitive     CLINDAMYCIN <=0.25 SENSITIVE Sensitive     RIFAMPIN <=0.5 SENSITIVE Sensitive     Inducible Clindamycin NEGATIVE Sensitive     * STAPHYLOCOCCUS AUREUS  Culture, blood (Routine X 2) w Reflex to ID Panel     Status: None   Collection Time: 01/20/16  8:57 AM  Result Value Ref Range Status   Specimen Description BLOOD LEFT FOREARM  Final   Special Requests BOTTLES DRAWN AEROBIC ONLY 10CC  Final   Culture  Setup Time   Final    GRAM POSITIVE COCCI  IN CLUSTERS CRITICAL RESULT CALLED TO, READ BACK BY AND VERIFIED WITH: TO SWHITEHORN(RN) BY TCLEVELAND 01/21/2016 AT 2:33AM AEROBIC BOTTLE ONLY    Culture   Final    STAPHYLOCOCCUS AUREUS SUSCEPTIBILITIES PERFORMED ON PREVIOUS CULTURE WITHIN THE LAST 5 DAYS.    Report Status 01/23/2016 FINAL  Final  Culture, blood (routine x 2)     Status: None (Preliminary result)   Collection Time: 01/22/16  9:13 AM  Result Value Ref Range Status   Specimen Description BLOOD RIGHT ANTECUBITAL  Final   Special Requests BOTTLES DRAWN AEROBIC AND ANAEROBIC 5CC  Final   Culture NO GROWTH 1 DAY  Final   Report Status PENDING  Incomplete  Culture, blood (routine x 2)     Status: None (Preliminary result)   Collection Time: 01/22/16  9:19 AM  Result  Value Ref Range Status   Specimen Description BLOOD BLOOD RIGHT FOREARM  Final   Special Requests BOTTLES DRAWN AEROBIC AND ANAEROBIC 5CC  Final   Culture NO GROWTH 1 DAY  Final   Report Status PENDING  Incomplete    Impression/Plan:  1. Staph aureus bacteremia - MSSA so now on cefazolin alone. CT, MRI without definitive infection but I am most concerned with discitis and/or Psoas myositis.   I recommend treatment for a prolonged period of IV cefazolin for 6 weeks through May 8th Cefazolin per home health protocol If repeat blood cultures remain negative tomorrow, ok to put in picc line tomorrow  2.  Leg pain - ? From stenosis or abscess.  I am not sure if this is related to the infection, which means it will improve over the next 1-2 weeks or if related to stenosis,which means it won't resolve soon.  Will need to continue to monitor.  Pain control per [facility] and will need follow up with his PCP at discharge for that.   3. Stenosis - worsening L4-5, new L2-3

## 2016-01-24 NOTE — Progress Notes (Cosign Needed)
Physical Therapy Treatment Patient Details Name: Joseph Garner MRN: [id number] DOB: 10-23-1924 Today's Date: 01/24/2016    History of Present Illness Pt. admitted 01/19/16 with muscle spasms worsening in the past few weeks.  Pt. with h/o CHF, AVR, spinal stenosis.  See physicians note for complete medical history     PT Comments    Patient continues to have difficulty ambulating more than ~30 ft. Daughter present for session and actively participating. Pt reported that he needed rest breaks during ambulating due to fatigue vs pain. Will need stair training before d/c.  Follow Up Recommendations  Home health PT     Equipment Recommendations  Rolling walker with 5" wheels    Recommendations for Other Services       Precautions / Restrictions Precautions Precautions: Fall Restrictions Weight Bearing Restrictions: No    Mobility  Bed Mobility               General bed mobility comments: OOB in chair upon arrival  Transfers Overall transfer level: Needs assistance Equipment used: Rolling walker (2 wheeled) Transfers: Sit to/from Stand Sit to Stand: Min assist         General transfer comment: assist to power up into standing and to gain balance upon stand; cues for hand placement and safe use of DME; reinforced need to place hands on armrests when descending to chair   Ambulation/Gait Ambulation/Gait assistance: Supervision Ambulation Distance (Feet): 50 Feet Assistive device: Rolling walker (2 wheeled) Gait Pattern/deviations: Step-through pattern;Decreased stride length;Trunk flexed Gait velocity: decreased   General Gait Details: slow cadence and no unsteadiness; two seated rest breaks needed due to fatigue and not pain per pt; cues for posture, step lenght, and position of RW   Stairs            Wheelchair Mobility    Modified Rankin (Stroke Patients Only)       Balance Overall balance assessment: Needs assistance Sitting-balance support: No  upper extremity supported;Feet supported Sitting balance-Leahy Scale: Good     Standing balance support: Bilateral upper extremity supported Standing balance-Leahy Scale: Fair                      Cognition Arousal/Alertness: Awake/alert Behavior During Therapy: WFL for tasks assessed/performed Overall Cognitive Status: Within Functional Limits for tasks assessed                      Exercises      General Comments        Pertinent Vitals/Pain Pain Assessment: Faces Faces Pain Scale: Hurts little more Pain Location: bilat LE Pain Descriptors / Indicators: Discomfort;Moaning;Guarding;Tightness;Other (Comment) (hurts) Pain Intervention(s): Limited activity within patient's tolerance;Monitored during session;Premedicated before session;Repositioned    Home Living                      Prior Function            PT Goals (current goals can now be found in the care plan section) Acute Rehab PT Goals Patient Stated Goal: none stated Progress towards PT goals: Progressing toward goals    Frequency  Min 3X/week    PT Plan Discharge plan needs to be updated    Co-evaluation             End of Session Equipment Utilized During Treatment: Gait belt Activity Tolerance: Patient limited by fatigue;Patient limited by pain Patient left: in chair;with call bell/phone within reach;with family/visitor present  Time: [id number] PT Time Calculation (min) (ACUTE ONLY): 33 min  Charges:  $Gait Training: 8-22 mins $Therapeutic Activity: 8-22 mins                    G Codes:      Salina April, PTA Pager: [phone]   01/24/2016, 4:04 PM

## 2016-01-24 NOTE — Progress Notes (Signed)
Occupational Therapy Treatment Patient Details Name: Joseph Garner MRN: [id number] DOB: 01/05/1924 Today's Date: 01/24/2016    History of present illness Pt. admitted 01/19/16 with muscle spasms worsening in the past few weeks.  Pt. with h/o CHF, AVR, spinal stenosis.  See physicians note for complete medical history    OT comments  Patient making slow progress towards OT goals and requires increased assistance due to pain. Difficult to assess cognition secondary to [facility], but pt moaning most of session and didn't respond to therapist much. According to daughter, wife has h/o dementia and would not be unable to appropriately care for patient at home. Daughter is thinking of taking her father home ~4.5 hours away. Believe pt would benefit from ST SNF stay, but if patient/family refuses, he will need 24/7 supervision/assistance at home [place].   Follow Up Recommendations  SNF;Supervision/Assistance - 24 hour (if family/patient refuse SNF, recommend HHOT)    Equipment Recommendations  3 in 1 bedside comode    Recommendations for Other Services  None at this time   Precautions / Restrictions Precautions Precautions: Fall Restrictions Weight Bearing Restrictions: No    Mobility Bed Mobility General bed mobility comments: Pt found seated EOB and left seated in recliner   Transfers Overall transfer level: Needs assistance Equipment used: Rolling walker (2 wheeled) Transfers: Sit to/from Stand Sit to Stand: Min assist General transfer comment: Min assist for safety and to steady upon standing. Pt with increased pain and tended to be either impulsive with movements or extremely slow with movements.     Balance Overall balance assessment: Needs assistance Sitting-balance support: No upper extremity supported;Feet supported Sitting balance-Leahy Scale: Good     Standing balance support: Bilateral upper extremity supported;During functional activity Standing balance-Leahy Scale: Fair   ADL  Overall ADL's : Needs assistance/impaired Eating/Feeding: Set up;Sitting   Grooming: Set up;Sitting   Upper Body Bathing: Supervision/ safety;Sitting;Set up   Lower Body Bathing: Minimal assistance;Sit to/from stand   Upper Body Dressing : Sitting;Minimal assistance   Lower Body Dressing: Moderate assistance;Sit to/from stand   Toilet Transfer: Minimal assistance;Ambulation;Cueing for safety Toilet Transfer Details (indicate cue type and reason): simulated bed to recliner Toileting- Clothing Manipulation and Hygiene: Minimal assistance;Sit to/from Nurse, children's Details (indicate cue type and reason): did not occur   General ADL Comments: Face score=8/10 pain, however pt did not verbalize any pain. Pt grunting and making noises during session, but did not really answer therapists questions. Pt limited by what appears to be pain, pt extremely slow and takes much more than a reasonable amount of time to complete any task at hand; not sure if this is due to pain? cognition? anticipation of pain?      Cognition   Behavior During Therapy: WFL for tasks assessed/performed Overall Cognitive Status: Difficult to assess                 Pertinent Vitals/ Pain       Pain Assessment: Faces Faces Pain Scale: Hurts whole lot Pain Location: back and BLEs Pain Descriptors / Indicators: Discomfort;Guarding;Grimacing;Moaning Pain Intervention(s): Limited activity within patient's tolerance;Monitored during session;Repositioned   Frequency Min 2X/week     Progress Toward Goals  OT Goals(current goals can now befound in the care plan section)  Progress towards OT goals: Progressing toward goals (Slowly progressing, pain limiting factor. Will continue to asses if goals need to be downgraded. )  Acute Rehab OT Goals Patient Stated Goal: none stated OT Goal Formulation: With  patient/family Time For Goal Achievement: 02/03/16 Potential to Achieve Goals: [place]  Plan  Discharge plan needs to be updated    End of Session Equipment Utilized During Treatment: Gait belt;Rolling walker   Activity Tolerance Patient tolerated treatment well   Patient Left in chair;with call bell/phone within reach;with family/visitor present    Time: [id number] OT Time Calculation (min): 22 min  Charges: OT General Charges $OT Visit: 1 Procedure OT Treatments $Therapeutic Activity: 8-22 mins  Chrys Racer , MS, OTR/L, CLT Pager: [phone]  01/24/2016, 2:51 PM

## 2016-01-24 NOTE — Consult Note (Signed)
   [facility] CM Inpatient Consult   01/24/2016  Joseph Garner 1924-06-27 [id number]   Second attempt to visit patient at bedside to discuss [facility] services. He was preoccupied during the bedside visit in conversation with his daughter regarding going home on iv antibiotics. Spoke with inpatient RNCM prior to visit. Daughter asks that Probation officer come back at later time to discuss program. Inpatient RNCM indicates the plan is for patient to go stay with daughter post discharge in [place]. Will go back to speak with daughter at later time. Daughter reports she will go down and get coffee and be back in the room shortly. Made inpatient RNCM aware.  Marthenia Rolling, MSN-Ed, RN,BSN [facility] Liaison [phone]

## 2016-01-24 NOTE — Care Management (Addendum)
Case manager has spoken with Hillis Range, patient's concerning follow-up care and monitoring for her dad whiloe he is in [place]. Patient will see Dr. Ruby Cola - phone [phone] [phone]. Case manager with permission from patient  Has faxed pertinent medical information for this hospitalization to [facility] at Dr. Marquis Buggy office. Case manager contacted [facility] in [place], [place], [phone],  [phone], [place], [place] [address], waiting for return call. I have also asked Sherrell Puller with [facility] if he could arrange for antibiotics to be delivered to patient's room prior to discharge. Case manager will continue to work at coordinating discharge.

## 2016-01-24 NOTE — Progress Notes (Signed)
[facility] Progress Note  Patient: Joseph Garner [id number]   PCP: Wenda Low, MD DOB: Feb 26, 1924   DOA: 01/19/2016   DOS: 01/24/2016   Date of Service: the patient was seen and examined on 01/24/2016  Subjective: The patient has developed a new rash involving his back and chest. It also has some itching. He also complains of pain while moving his legs. Denies having any chest pain or abdominal pain or nausea or vomiting. Nutrition: Tolerating oral diet  Brief hospital course: Patient was admitted on 01/19/2016, with complaint of muscle spasm and fever, was found to have MSSA bacteremia. MRI negative for any acute discitis, echocardiogram negative for vegetation. TEE is negative for endocarditis Currently further plan is monitor the culture, PICC line insertion, symptom control.  Assessment and Plan: 1. Bacteremia MSSA bacteremia of unknown etiology. No evidence of endocarditis. Possibility of psoas abscess or discitis. Possibility of myositis involving the sole loss muscle. Infectious diseases was consulted and the patient is on IV cefazolin and will complete 6 weeks antibiotic treatment from negative culture. Probable PICC line insertion tomorrow if the cultures remain negative. The patient has developed a maculopapular rash which I think is more associated with MSSA bacteremia rather than an allergic reaction to cephalosporin.  2. Maculopapular rash. The patient has developed a maculopapular rash in the hospital. This could be an allergic reaction to the cephalosporin. Although was admitted that is less likely since the rash appears to be less likely an allergic rash. I would use Benadryl and Pepcid. Calamine lotion as well as hydrocortisone. Reevaluate in the morning.  3. Hyponatremia. We'll start the patient on gentle IV hydration and monitor BMP every 4 hours. Likely related to poor oral intake.  4. Chronic diastolic dysfunction. Ejection fraction 50-55%. On Lasix at  home. Next and currently on hold. We'll monitor closely.  5. Anemia. Most likely anemia of chronic disease or anemia of poor oral intake. Start the patient on iron supplementation.  Activity: physical therapy SNF versus home health 24 hour observation Bowel regimen: last BM 01/21/2016 DVT Prophylaxis: subcutaneous Heparin Nutrition: Regular diet Advance goals of care discussion: Full code  HPI: As per the H and P dictated on admission, "Joseph Garner is a 82 y.o. male with h/o CHF, AVR. Patient presents to the ED with muscle spasms. These have been ongoing for months but worsening the past couple of weeks. Zanaflex daily started last week hasnt really helped. Had fall last night and went to [facility]. Given script for pain med and followed up with PCP. PCP gave demerol and sent back into ED due to ongoing pain." Procedures: Echocardiogram, TEE Consultants: Infectious disease, cardiology Antibiotics: Anti-infectives    Start     Dose/Rate Route Frequency Ordered Stop   01/22/16 1400  ceFAZolin (ANCEF) IVPB 2g/100 mL premix     2 g 200 mL/hr over 30 Minutes Intravenous 3 times per day 01/22/16 1143     01/21/16 1000  vancomycin (VANCOCIN) IVPB 1000 mg/200 mL premix  Status:  Discontinued     1,000 mg 200 mL/hr over 60 Minutes Intravenous Every 24 hours 01/20/16 1524 01/23/16 1033   01/20/16 1600  piperacillin-tazobactam (ZOSYN) IVPB 3.375 g  Status:  Discontinued     3.375 g 12.5 mL/hr over 240 Minutes Intravenous 3 times per day 01/20/16 1521 01/22/16 1142   01/20/16 0915  vancomycin (VANCOCIN) IVPB 1000 mg/200 mL premix     1,000 mg 200 mL/hr over 60 Minutes Intravenous  Once 01/20/16 0909 01/20/16 1111  01/20/16 0915  piperacillin-tazobactam (ZOSYN) IVPB 3.375 g     3.375 g 12.5 mL/hr over 240 Minutes Intravenous  Once 01/20/16 [id number] 01/20/16 1411       Family Communication: no family was present at bedside, at the time of interview.   Disposition:  Expected discharge date:  01/26/2016 Barriers to safe discharge: Pain control as well as improvement in rash   Intake/Output Summary (Last 24 hours) at 01/24/16 1708 Last data filed at 01/24/16 0900  Gross per 24 hour  Intake    615 ml  Output    775 ml  Net   -160 ml   Filed Weights   01/20/16 1500  Weight: 72.6 kg (160 lb 0.9 oz)    Objective: Physical Exam: Filed Vitals:   01/23/16 2100 01/23/16 2300 01/24/16 0100 01/24/16 0500  BP: 148/86  144/80 149/61  Pulse: 95  90 58  Temp: 99.9 F (37.7 C) 101.1 F (38.4 C) 99.4 F (37.4 C) 97.8 F (36.6 C)  TempSrc: Oral Rectal Oral Oral  Resp: 18  18 18   Height:      Weight:      SpO2: 94%  95% 97%     General: Appear in moderate distress, diffuse maculopapular Rash; Oral Mucosa moist. Cardiovascular: S1 and S2 Present, no Murmur, no JVD Respiratory: Bilateral Air entry present and Clear to Auscultation, no Crackles, no wheezes Abdomen: Bowel Sound present, Soft and no tenderness Extremities: no Pedal edema, no calf tenderness Neurology: Grossly no focal neuro deficit.  Data Reviewed: CBC:  Recent Labs Lab 01/19/16 0240 01/19/16 1542 01/20/16 0545 01/21/16 0607 01/22/16 0458 01/23/16 0547 01/24/16 0430  WBC 15.6* 14.6* 14.1* 17.8* 13.3* 9.6 9.8  NEUTROABS 11.9* 11.0*  --  14.7* 10.5* 6.7  --   HGB 12.3* 11.4* 9.8* 8.8* 8.8* 8.3* 9.3*  HCT 34.3* 33.1* 28.4* 24.7* 25.6* 24.2* 27.1*  MCV 93.5 95.4 94.7 94.3 93.8 93.8 93.8  PLT 184 170 159 133* 146* 140* 0000000   Basic Metabolic Panel:  Recent Labs Lab 01/19/16 0240  01/19/16 1912  01/21/16 0607 01/22/16 0458 01/23/16 0547 01/24/16 0430 01/24/16 1428  NA 129*  < >  --   < > 131* 130* 129* 127* 128*  K 4.1  < >  --   < > 3.7 3.7 3.5 3.6 3.8  CL 99*  < >  --   < > 101 99* 100* 95* 95*  CO2 23  < >  --   < > 23 23 24 25 23   GLUCOSE 120*  < >  --   < > 112* 114* 108* 100* 106*  BUN 21*  < >  --   < > 26* 27* 20 14 13   CREATININE 1.02  < >  --   < > 1.36* 1.22 1.10 0.92 1.00    CALCIUM 8.2*  < >  --   < > 8.0* 7.9* 7.5* 7.7* 8.1*  MG 1.9  --   --   --   --   --   --   --   --   PHOS  --   --  3.4  --   --   --   --   --   --   < > = values in this interval not displayed. Liver Function Tests:  Recent Labs Lab 01/19/16 1542 01/22/16 0458  AST 23 28  ALT 16* 23  ALKPHOS 61 54  BILITOT 1.2 0.8  PROT 6.2* 5.2*  ALBUMIN 3.6  2.6*   No results for input(s): LIPASE, AMYLASE in the last 168 hours. No results for input(s): AMMONIA in the last 168 hours.  Cardiac Enzymes:  Recent Labs Lab 01/19/16 0240  CKTOTAL 94    BNP (last 3 results) No results for input(s): BNP in the last 8760 hours.  CBG: No results for input(s): GLUCAP in the last 168 hours.  Recent Results (from the past 240 hour(s))  MRSA PCR Screening     Status: None   Collection Time: 01/20/16  6:43 AM  Result Value Ref Range Status   MRSA by PCR NEGATIVE NEGATIVE Final    Comment:        The GeneXpert MRSA Assay (FDA approved for NASAL specimens only), is one component of a comprehensive MRSA colonization surveillance program. It is not intended to diagnose MRSA infection nor to guide or monitor treatment for MRSA infections.   Culture, blood (Routine X 2) w Reflex to ID Panel     Status: None   Collection Time: 01/20/16  8:50 AM  Result Value Ref Range Status   Specimen Description BLOOD LEFT ANTECUBITAL  Final   Special Requests BOTTLES DRAWN AEROBIC AND ANAEROBIC 10CC  Final   Culture  Setup Time   Final    GRAM POSITIVE COCCI IN CLUSTERS CRITICAL RESULT CALLED TO, READ BACK BY AND VERIFIED WITH: TO SWHITE(RN) BY TCLEVELAND 01/21/16 AT 1:08AM IN BOTH AEROBIC AND ANAEROBIC BOTTLES    Culture STAPHYLOCOCCUS AUREUS  Final   Report Status 01/23/2016 FINAL  Final   Organism ID, Bacteria STAPHYLOCOCCUS AUREUS  Final      Susceptibility   Staphylococcus aureus - MIC*    CIPROFLOXACIN <=0.5 SENSITIVE Sensitive     ERYTHROMYCIN <=0.25 SENSITIVE Sensitive     GENTAMICIN <=0.5  SENSITIVE Sensitive     OXACILLIN <=0.25 SENSITIVE Sensitive     TETRACYCLINE <=1 SENSITIVE Sensitive     VANCOMYCIN <=0.5 SENSITIVE Sensitive     TRIMETH/SULFA <=10 SENSITIVE Sensitive     CLINDAMYCIN <=0.25 SENSITIVE Sensitive     RIFAMPIN <=0.5 SENSITIVE Sensitive     Inducible Clindamycin NEGATIVE Sensitive     * STAPHYLOCOCCUS AUREUS  Culture, blood (Routine X 2) w Reflex to ID Panel     Status: None   Collection Time: 01/20/16  8:57 AM  Result Value Ref Range Status   Specimen Description BLOOD LEFT FOREARM  Final   Special Requests BOTTLES DRAWN AEROBIC ONLY 10CC  Final   Culture  Setup Time   Final    GRAM POSITIVE COCCI IN CLUSTERS CRITICAL RESULT CALLED TO, READ BACK BY AND VERIFIED WITH: TO SWHITEHORN(RN) BY TCLEVELAND 01/21/2016 AT 2:33AM AEROBIC BOTTLE ONLY    Culture   Final    STAPHYLOCOCCUS AUREUS SUSCEPTIBILITIES PERFORMED ON PREVIOUS CULTURE WITHIN THE LAST 5 DAYS.    Report Status 01/23/2016 FINAL  Final  Culture, blood (routine x 2)     Status: None (Preliminary result)   Collection Time: 01/22/16  9:13 AM  Result Value Ref Range Status   Specimen Description BLOOD RIGHT ANTECUBITAL  Final   Special Requests BOTTLES DRAWN AEROBIC AND ANAEROBIC 5CC  Final   Culture NO GROWTH 2 DAYS  Final   Report Status PENDING  Incomplete  Culture, blood (routine x 2)     Status: None (Preliminary result)   Collection Time: 01/22/16  9:19 AM  Result Value Ref Range Status   Specimen Description BLOOD BLOOD RIGHT FOREARM  Final   Special Requests BOTTLES DRAWN AEROBIC AND  ANAEROBIC 5CC  Final   Culture NO GROWTH 2 DAYS  Final   Report Status PENDING  Incomplete     Studies: Ct Abdomen Pelvis W Contrast  01/23/2016  CLINICAL DATA:  Inpatient. Back pain. Indeterminate central low signal focus in the left psoas muscle on recent MRI lumbar spine. EXAM: CT ABDOMEN AND PELVIS WITH CONTRAST TECHNIQUE: Multidetector CT imaging of the abdomen and pelvis was performed using the  standard protocol following bolus administration of intravenous contrast. CONTRAST:  100 cc Isovue-300 IV. COMPARISON:  01/22/2016 lumbar spine MRI. 05/20/2013 CT abdomen/pelvis. FINDINGS: Lower chest: Small layering bilateral pleural effusions. Cardiomegaly. Aortic valve prosthesis is partially visualized in place. Coronary atherosclerosis. Oral contrast in the lower thoracic esophagus. Mild-to-moderate compressive atelectasis in the dependent lower lobes. Hepatobiliary: Normal liver with no liver mass. No radiopaque cholelithiasis. No definite gallbladder wall thickening. No biliary ductal dilatation. Pancreas: Normal, with no mass or duct dilation. Spleen: Normal size. No mass. Adrenals/Urinary Tract: Mild irregular thickening of both adrenal glands, unchanged since 2014, in keeping with mild adrenal hyperplasia or multiple small adenomas. Subcentimeter hypodense renal cortical lesion in the upper right kidney, too small to characterize, unchanged since 05/20/2013, in keeping with a benign renal cyst. 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 Otherwise normal kidneys, with no hydronephrosis. Mildly to moderately distended bladder, which otherwise appears normal. Stomach/Bowel: Small hiatal hernia. Otherwise grossly normal stomach. Normal caliber small bowel with no small bowel wall thickening. Status post appendectomy. Mild sigmoid diverticulosis, with no large bowel wall thickening or pericolonic fat stranding. Vascular/Lymphatic: Atherosclerotic nonaneurysmal abdominal aorta. Patent portal, splenic, hepatic and renal veins. No pathologically enlarged lymph nodes in the abdomen or pelvis. Reproductive: Moderate prostatomegaly. Other: No pneumoperitoneum.  Trace perihepatic ascites. Musculoskeletal: No aggressive appearing focal osseous lesions. Moderate degenerative changes in the visualized thoracolumbar spine. There is mild asymmetric enlargement of the left psoas muscle. There is no discrete rim enhancing  fluid collection in the left psoas muscle. There is a vague slightly hyperdense 1.7 x 1.6 cm focus in the central left psoas muscle, which could represent a small intramuscular hematoma. Visualized lower sternotomy wire is intact. Mild anasarca. IMPRESSION: 1. Mild asymmetric enlargement of the left psoas muscle. No discrete rim enhancing fluid collection in the left psoas muscle to suggest an abscess. Vague slightly hyperdense 1.7 x 1.6 cm focus in the central left psoas muscle, possibly a small intramuscular hematoma. 2. Small bilateral pleural effusions. Trace perihepatic ascites. Mild anasarca. Cardiomegaly. 3. Oral contrast in the lower thoracic esophagus, suggesting esophageal dysmotility and/or gastroesophageal reflux. 4. Additional findings include coronary atherosclerosis, small hiatal hernia, moderate prostatomegaly and mild sigmoid diverticulosis. Electronically Signed   By: Ilona Sorrel M.D.   On: 01/23/2016 17:56     Scheduled Meds: . ALPRAZolam  0.125 mg Oral BID  . amLODipine  2.5 mg Oral Daily  . apixaban  2.5 mg Oral BID  . atorvastatin  20 mg Oral Daily  .  ceFAZolin (ANCEF) IV  2 g Intravenous 3 times per day  . famotidine  20 mg Oral BID  . ferrous sulfate  325 mg Oral Q breakfast  . folic acid  1 mg Oral Daily  . hydrocortisone cream   Topical BID  . multivitamin with minerals  1 tablet Oral Daily  . oxybutynin  15 mg Oral QHS  . potassium chloride SA  20 mEq Oral Daily  . tamsulosin  0.4 mg Oral Daily  . tiZANidine  2 mg Oral TID   Continuous  Infusions: . sodium chloride 50 mL/hr at 01/24/16 1015   PRN Meds: calamine, fluticasone, oxyCODONE-acetaminophen, traMADol  Time spent: 30 minutes  Author: Berle Mull, MD [facility] Pager: [phone] 01/24/2016 5:08 PM  If 7PM-7AM, please contact night-coverage at [email or web address], password [facility]

## 2016-01-24 NOTE — Consult Note (Signed)
   [facility] CM Inpatient Consult   01/24/2016  Khadim Hayen 1924-08-12 [id number]   Follow up visit made with patient's daughter, Dmoni Causby. Received patient's permission to discuss with daughter. Patient's daughter confirms the plan is to go to [facility] during the duration of his home iv antibiotics. Mr. Mcquown will have home health out of [facility]. Explained that [facility] will not interfere or replace services provided by home health. Explained that patient will receive post hospital transition of care calls during his stay in [place], [place]. Patient is eligible for post transition care calls and once he comes back to [facility] he will be eligible for home visits if needed. [name], daughter is agreeable to this. Confirmed that the best contact number will be her cell number as [phone]. Patient will be followed by Primary Care MD in [facility] in the meantime. [facility] Care Management packet and contact information provided. Will request for patient to be assigned for [facility] telephone calls initially. Inpatient RNCM present during conversation with daughter.   Marthenia Rolling, MSN-Ed, RN,BSN [facility] Liaison [phone]

## 2016-01-25 DIAGNOSIS — R21 Rash and other nonspecific skin eruption: Secondary | ICD-10-CM

## 2016-01-25 LAB — COMPREHENSIVE METABOLIC PANEL
ALBUMIN: 2.5 g/dL — AB (ref 3.5–5.0)
ALT: 13 U/L — ABNORMAL LOW (ref 17–63)
AST: 27 U/L (ref 15–41)
Alkaline Phosphatase: 75 U/L (ref 38–126)
Anion gap: 7 (ref 5–15)
BILIRUBIN TOTAL: 0.7 mg/dL (ref 0.3–1.2)
BUN: 13 mg/dL (ref 6–20)
CHLORIDE: 97 mmol/L — AB (ref 101–111)
CO2: 25 mmol/L (ref 22–32)
Calcium: 7.9 mg/dL — ABNORMAL LOW (ref 8.9–10.3)
Creatinine, Ser: 0.96 mg/dL (ref 0.61–1.24)
GFR calc Af Amer: >60 mL/min (ref >60)
GFR calc non Af Amer: >60 mL/min (ref >60)
GLUCOSE: 106 mg/dL — AB (ref 65–99)
POTASSIUM: 3.6 mmol/L (ref 3.5–5.1)
SODIUM: 129 mmol/L — AB (ref 135–145)
Total Protein: 5.3 g/dL — ABNORMAL LOW (ref 6.5–8.1)

## 2016-01-25 LAB — CBC WITH DIFFERENTIAL/PLATELET
BASOS ABS: 0 10*3/uL (ref 0.0–0.1)
BASOS PCT: 0 %
Eosinophils Absolute: 0.3 10*3/uL (ref 0.0–0.7)
Eosinophils Relative: 3 %
HEMATOCRIT: 27.5 % — AB (ref 39.0–52.0)
Hemoglobin: 9.6 g/dL — ABNORMAL LOW (ref 13.0–17.0)
Lymphocytes Relative: 8 %
Lymphs Abs: 1 10*3/uL (ref 0.7–4.0)
MCH: 32.3 pg (ref 26.0–34.0)
MCHC: 34.9 g/dL (ref 30.0–36.0)
MCV: 92.6 fL (ref 78.0–100.0)
MONO ABS: 1.3 10*3/uL — AB (ref 0.1–1.0)
Monocytes Relative: 11 %
NEUTROS ABS: 9.8 10*3/uL — AB (ref 1.7–7.7)
Neutrophils Relative %: 78 %
PLATELETS: 196 10*3/uL (ref 150–400)
RBC: 2.97 MIL/uL — ABNORMAL LOW (ref 4.22–5.81)
RDW: 12.9 % (ref 11.5–15.5)
WBC: 12.4 10*3/uL — ABNORMAL HIGH (ref 4.0–10.5)

## 2016-01-25 LAB — SODIUM
SODIUM: 128 mmol/L — AB (ref 135–145)
SODIUM: 129 mmol/L — AB (ref 135–145)
SODIUM: 128 mmol/L — AB (ref 135–145)
SODIUM: 126 mmol/L — AB (ref 135–145)

## 2016-01-25 LAB — MAGNESIUM: Magnesium: 2 mg/dL (ref 1.7–2.4)

## 2016-01-25 LAB — PROTIME-INR
INR: 1.47 (ref 0.00–1.49)
Prothrombin Time: 17.9 seconds — ABNORMAL HIGH (ref 11.6–15.2)

## 2016-01-25 LAB — C-REACTIVE PROTEIN: CRP: 6.2 mg/dL — AB (ref <1.0)

## 2016-01-25 MED ORDER — LORATADINE 10 MG PO TABS
10.0000 mg | ORAL_TABLET | Freq: Every day | ORAL | Status: DC
  Administered 2016-01-25 – 2016-01-29 (×5): 10 mg via ORAL
  Filled 2016-01-25 (×5): qty 1

## 2016-01-25 MED ORDER — DM-GUAIFENESIN ER 30-600 MG PO TB12
1.0000 | ORAL_TABLET | Freq: Two times a day (BID) | ORAL | Status: DC
  Administered 2016-01-25 – 2016-01-29 (×6): 1 via ORAL
  Filled 2016-01-25 (×16): qty 1

## 2016-01-25 MED ORDER — GUAIFENESIN ER 600 MG PO TB12: 600.0000 mg | ORAL_TABLET | Freq: Two times a day (BID) | ORAL | Status: DC

## 2016-01-25 MED ORDER — SODIUM CHLORIDE 1 G PO TABS
1.0000 g | ORAL_TABLET | Freq: Three times a day (TID) | ORAL | Status: DC
  Administered 2016-01-25 – 2016-01-29 (×10): 1 g via ORAL
  Filled 2016-01-25 (×13): qty 1

## 2016-01-25 MED ORDER — BENZONATATE 100 MG PO CAPS
100.0000 mg | ORAL_CAPSULE | Freq: Three times a day (TID) | ORAL | Status: DC | PRN
  Administered 2016-01-27: 100 mg via ORAL
  Filled 2016-01-25: qty 1

## 2016-01-25 MED ORDER — CEFAZOLIN SODIUM-DEXTROSE 2-4 GM/100ML-% IV SOLN
2.0000 g | Freq: Three times a day (TID) | INTRAVENOUS | Status: AC
Start: 2016-01-25 — End: 2016-03-04

## 2016-01-25 NOTE — Care Management (Signed)
10:00am-12noon  [facility] will not be able to provide care for patient. Case manager has spoken with Sherrell Puller of [facility] has contacted [facility] in [place] concerning administering IV antibiotics, they will also be able to provide [facility], PT/OT services.  4:00pm Case manager has faxed prescription for IV ancef to [facility] @ [phone]. Patient should have PICC line inserted by 01/26/16, CM will fax information concerning PICC insertion to [facility] as well. Patient's daughter is coordinating further care and transport for her parents to [place], [place]. Case Manager will continue to monitor.

## 2016-01-25 NOTE — Progress Notes (Signed)
[facility] for Infectious Disease   Reason for visit: Follow up on Staph aureus bacteremia  Interval History: Afebrile.  Repeat blood cultures remain negative.  Now developed a rash.    Physical Exam: Constitutional:  Filed Vitals:   01/24/16 1330 01/25/16 0300  BP: 117/53 121/64  Pulse: 70 74  Temp: 98.5 F (36.9 C) 98.3 F (36.8 C)  Resp: 18 18   patient appears in less distress due to pain Respiratory: Normal respiratory effort; CTA B Cardiovascular: RRR Flank: no flank tenderness Skin: + non-confluent, erythematous, scaly pinpoint rash on arms, face, torso  Review of Systems: Constitutional: negative for fevers and chills Gastrointestinal: negative for diarrhea Integument/breast: negative for rash  Lab Results  Component Value Date   WBC 12.4* 01/25/2016   HGB 9.6* 01/25/2016   HCT 27.5* 01/25/2016   MCV 92.6 01/25/2016   PLT 196 01/25/2016    Lab Results  Component Value Date   CREATININE 0.96 01/25/2016   BUN 13 01/25/2016   NA 129* 01/25/2016   K 3.6 01/25/2016   CL 97* 01/25/2016   CO2 25 01/25/2016    Lab Results  Component Value Date   ALT 13* 01/25/2016   AST 27 01/25/2016   ALKPHOS 75 01/25/2016     Microbiology: Recent Results (from the past 240 hour(s))  MRSA PCR Screening     Status: None   Collection Time: 01/20/16  6:43 AM  Result Value Ref Range Status   MRSA by PCR NEGATIVE NEGATIVE Final    Comment:        The GeneXpert MRSA Assay (FDA approved for NASAL specimens only), is one component of a comprehensive MRSA colonization surveillance program. It is not intended to diagnose MRSA infection nor to guide or monitor treatment for MRSA infections.   Culture, blood (Routine X 2) w Reflex to ID Panel     Status: None   Collection Time: 01/20/16  8:50 AM  Result Value Ref Range Status   Specimen Description BLOOD LEFT ANTECUBITAL  Final   Special Requests BOTTLES DRAWN AEROBIC AND ANAEROBIC 10CC  Final   Culture  Setup  Time   Final    GRAM POSITIVE COCCI IN CLUSTERS CRITICAL RESULT CALLED TO, READ BACK BY AND VERIFIED WITH: TO SWHITE(RN) BY TCLEVELAND 01/21/16 AT 1:08AM IN BOTH AEROBIC AND ANAEROBIC BOTTLES    Culture STAPHYLOCOCCUS AUREUS  Final   Report Status 01/23/2016 FINAL  Final   Organism ID, Bacteria STAPHYLOCOCCUS AUREUS  Final      Susceptibility   Staphylococcus aureus - MIC*    CIPROFLOXACIN <=0.5 SENSITIVE Sensitive     ERYTHROMYCIN <=0.25 SENSITIVE Sensitive     GENTAMICIN <=0.5 SENSITIVE Sensitive     OXACILLIN <=0.25 SENSITIVE Sensitive     TETRACYCLINE <=1 SENSITIVE Sensitive     VANCOMYCIN <=0.5 SENSITIVE Sensitive     TRIMETH/SULFA <=10 SENSITIVE Sensitive     CLINDAMYCIN <=0.25 SENSITIVE Sensitive     RIFAMPIN <=0.5 SENSITIVE Sensitive     Inducible Clindamycin NEGATIVE Sensitive     * STAPHYLOCOCCUS AUREUS  Culture, blood (Routine X 2) w Reflex to ID Panel     Status: None   Collection Time: 01/20/16  8:57 AM  Result Value Ref Range Status   Specimen Description BLOOD LEFT FOREARM  Final   Special Requests BOTTLES DRAWN AEROBIC ONLY 10CC  Final   Culture  Setup Time   Final    GRAM POSITIVE COCCI IN CLUSTERS CRITICAL RESULT CALLED TO, READ BACK BY  AND VERIFIED WITH: TO SWHITEHORN(RN) BY [facility] 01/21/2016 AT 2:33AM AEROBIC BOTTLE ONLY    Culture   Final    STAPHYLOCOCCUS AUREUS SUSCEPTIBILITIES PERFORMED ON PREVIOUS CULTURE WITHIN THE LAST 5 DAYS.    Report Status 01/23/2016 FINAL  Final  Culture, blood (routine x 2)     Status: None (Preliminary result)   Collection Time: 01/22/16  9:13 AM  Result Value Ref Range Status   Specimen Description BLOOD RIGHT ANTECUBITAL  Final   Special Requests BOTTLES DRAWN AEROBIC AND ANAEROBIC 5CC  Final   Culture NO GROWTH 2 DAYS  Final   Report Status PENDING  Incomplete  Culture, blood (routine x 2)     Status: None (Preliminary result)   Collection Time: 01/22/16  9:19 AM  Result Value Ref Range Status   Specimen Description  BLOOD BLOOD RIGHT FOREARM  Final   Special Requests BOTTLES DRAWN AEROBIC AND ANAEROBIC 5CC  Final   Culture NO GROWTH 2 DAYS  Final   Report Status PENDING  Incomplete    Impression/Plan:  1. Staph aureus bacteremia - MSSA so now on cefazolin alone. CT, MRI without definitive infection but I am most concerned with discitis and/or Psoas myositis.   I recommend treatment for a prolonged period of IV cefazolin for 6 weeks through May 8th Cefazolin per home health protocol  picc line  2.  Leg pain - ? From stenosis or abscess.  I am not sure if this is related to the infection, which means it will improve over the next 1-2 weeks or if related to stenosis,which means it won't resolve soon.    3. Stenosis - worsening L4-5, new L2-3   4. Rash - not typical for drug rash, certainly not hives. I would continue with cefazolin and reassess in about 1 week.  If it worsens, would change to vancomycin per home health protocol.    5. dispostion - going to stay with daughter in [place].  Can contact us if needed, PCP there to manage.  [facility] [id number]      Pager [phone]

## 2016-01-25 NOTE — Progress Notes (Signed)
[facility] Progress Note  Patient: Joseph Garner [id number]   PCP: Wenda Low, MD DOB: 1923-11-15   DOA: 01/19/2016   DOS: 01/25/2016   Date of Service: the patient was seen and examined on 01/25/2016  Subjective: The patient mentions that the pain is well tolerated and denies having any complaints of chest pain shortness of breath nausea vomiting diarrhea. Nutrition: Tolerating oral diet  Brief hospital course: Patient was admitted on 01/19/2016, with complaint of muscle spasm and fever, was found to have MSSA bacteremia. MRI negative for any acute discitis, echocardiogram negative for vegetation. TEE is negative for endocarditis Currently further plan is monitor the culture, PICC line insertion, symptom control.  Assessment and Plan: 1. Bacteremia MSSA bacteremia of unknown etiology. No evidence of endocarditis. Possibility of psoas abscess or discitis. Possibility of myositis involving the sole loss muscle. Infectious diseases was consulted and the patient is on IV cefazolin and will complete 6 weeks antibiotic treatment from negative culture. PICC line insertion today The patient has developed a maculopapular rash which I think is more associated with MSSA bacteremia rather than an allergic reaction to cephalosporin.  2. Maculopapular rash. The patient has developed a maculopapular rash in the hospital. This could be an allergic reaction to the cephalosporin. Although from the appearance it is less likely an allergic rash. I would use Claritin and Pepcid. Calamine lotion as well as hydrocortisone. At present appears to be stabilizing  3. Hyponatremia. appears clinically dehydrated. We will increase the rate 200 mL per hour and recheck the sodium in 4 hours Likely related to poor oral intake.  4. Chronic diastolic dysfunction. Ejection fraction 50-55%. On Lasix at home.  currently on hold. We'll monitor closely.  5. Anemia. Most likely anemia of chronic disease or  anemia of poor oral intake. Continue on iron supplementation.  Activity: physical therapy SNF versus home health 24 hour observation Bowel regimen: last BM 01/21/2016 DVT Prophylaxis: subcutaneous Heparin Nutrition: Regular diet Advance goals of care discussion: Full code  HPI: As per the H and P dictated on admission, "Christoher Sia is a 82 y.o. male with h/o CHF, AVR. Patient presents to the ED with muscle spasms. These have been ongoing for months but worsening the past couple of weeks. Zanaflex daily started last week hasnt really helped. Had fall last night and went to [facility]. Given script for pain med and followed up with PCP. PCP gave demerol and sent back into ED due to ongoing pain." Procedures: Echocardiogram, TEE Consultants: Infectious disease, cardiology Antibiotics: Anti-infectives    Start     Dose/Rate Route Frequency Ordered Stop   01/22/16 1400  ceFAZolin (ANCEF) IVPB 2g/100 mL premix     2 g 200 mL/hr over 30 Minutes Intravenous 3 times per day 01/22/16 1143     01/21/16 1000  vancomycin (VANCOCIN) IVPB 1000 mg/200 mL premix  Status:  Discontinued     1,000 mg 200 mL/hr over 60 Minutes Intravenous Every 24 hours 01/20/16 1524 01/23/16 1033   01/20/16 1600  piperacillin-tazobactam (ZOSYN) IVPB 3.375 g  Status:  Discontinued     3.375 g 12.5 mL/hr over 240 Minutes Intravenous 3 times per day 01/20/16 1521 01/22/16 1142   01/20/16 0915  vancomycin (VANCOCIN) IVPB 1000 mg/200 mL premix     1,000 mg 200 mL/hr over 60 Minutes Intravenous  Once 01/20/16 0909 01/20/16 1111   01/20/16 0915  piperacillin-tazobactam (ZOSYN) IVPB 3.375 g     3.375 g 12.5 mL/hr over 240 Minutes Intravenous  Once 01/20/16 [id number]  01/20/16 1411       Family Communication: no family was present at bedside, at the time of interview.   Disposition:  Expected discharge date: 01/26/2016 Barriers to safe discharge: improvement in rash, and sodium levels   Intake/Output Summary (Last 24 hours) at  01/25/16 1302 Last data filed at 01/25/16 0900  Gross per 24 hour  Intake    590 ml  Output    650 ml  Net    -60 ml   Filed Weights   01/20/16 1500  Weight: 72.6 kg (160 lb 0.9 oz)    Objective: Physical Exam: Filed Vitals:   01/24/16 0100 01/24/16 0500 01/24/16 1330 01/25/16 0300  BP: 144/80 149/61 117/53 121/64  Pulse: 90 58 70 74  Temp: 99.4 F (37.4 C) 97.8 F (36.6 C) 98.5 F (36.9 C) 98.3 F (36.8 C)  TempSrc: Oral Oral    Resp: 18 18 18 18   Height:      Weight:      SpO2: 95% 97% 93% 96%     General: Appear in moderate distress, diffuse maculopapular Rash; Oral Mucosa moist. Cardiovascular: S1 and S2 Present, no Murmur, no JVD Respiratory: Bilateral Air entry present and Clear to Auscultation, no Crackles, no wheezes Abdomen: Bowel Sound present, Soft and no tenderness Extremities: no Pedal edema, no calf tenderness  Data Reviewed: CBC:  Recent Labs Lab 01/19/16 1542  01/21/16 0607 01/22/16 0458 01/23/16 0547 01/24/16 0430 01/25/16 0725  WBC 14.6*  < > 17.8* 13.3* 9.6 9.8 12.4*  NEUTROABS 11.0*  --  14.7* 10.5* 6.7  --  9.8*  HGB 11.4*  < > 8.8* 8.8* 8.3* 9.3* 9.6*  HCT 33.1*  < > 24.7* 25.6* 24.2* 27.1* 27.5*  MCV 95.4  < > 94.3 93.8 93.8 93.8 92.6  PLT 170  < > 133* 146* 140* 181 196  < > = values in this interval not displayed. Basic Metabolic Panel:  Recent Labs Lab 01/19/16 0240  01/19/16 1912  01/22/16 0458 01/23/16 0547 01/24/16 0430 01/24/16 1428 01/24/16 1836 01/24/16 2320 01/25/16 0215 01/25/16 0725 01/25/16 1005  NA 129*  < >  --   < > 130* 129* 127* 128* 127* 129* 128* 129* 129*  K 4.1  < >  --   < > 3.7 3.5 3.6 3.8  --   --   --  3.6  --   CL 99*  < >  --   < > 99* 100* 95* 95*  --   --   --  97*  --   CO2 23  < >  --   < > 23 24 25 23   --   --   --  25  --   GLUCOSE 120*  < >  --   < > 114* 108* 100* 106*  --   --   --  106*  --   BUN 21*  < >  --   < > 27* 20 14 13   --   --   --  13  --   CREATININE 1.02  < >  --   < >  1.22 1.10 0.92 1.00  --   --   --  0.96  --   CALCIUM 8.2*  < >  --   < > 7.9* 7.5* 7.7* 8.1*  --   --   --  7.9*  --   MG 1.9  --   --   --   --   --   --   --   --   --   --  2.0  --   PHOS  --   --  3.4  --   --   --   --   --   --   --   --   --   --   < > = values in this interval not displayed. Liver Function Tests:  Recent Labs Lab 01/19/16 1542 01/22/16 0458 01/25/16 0725  AST 23 28 27   ALT 16* 23 13*  ALKPHOS 61 54 75  BILITOT 1.2 0.8 0.7  PROT 6.2* 5.2* 5.3*  ALBUMIN 3.6 2.6* 2.5*   No results for input(s): LIPASE, AMYLASE in the last 168 hours. No results for input(s): AMMONIA in the last 168 hours.  Cardiac Enzymes:  Recent Labs Lab 01/19/16 0240  CKTOTAL 94    BNP (last 3 results) No results for input(s): BNP in the last 8760 hours.  CBG: No results for input(s): GLUCAP in the last 168 hours.  Recent Results (from the past 240 hour(s))  MRSA PCR Screening     Status: None   Collection Time: 01/20/16  6:43 AM  Result Value Ref Range Status   MRSA by PCR NEGATIVE NEGATIVE Final    Comment:        The GeneXpert MRSA Assay (FDA approved for NASAL specimens only), is one component of a comprehensive MRSA colonization surveillance program. It is not intended to diagnose MRSA infection nor to guide or monitor treatment for MRSA infections.   Culture, blood (Routine X 2) w Reflex to ID Panel     Status: None   Collection Time: 01/20/16  8:50 AM  Result Value Ref Range Status   Specimen Description BLOOD LEFT ANTECUBITAL  Final   Special Requests BOTTLES DRAWN AEROBIC AND ANAEROBIC 10CC  Final   Culture  Setup Time   Final    GRAM POSITIVE COCCI IN CLUSTERS CRITICAL RESULT CALLED TO, READ BACK BY AND VERIFIED WITH: TO SWHITE(RN) BY TCLEVELAND 01/21/16 AT 1:08AM IN BOTH AEROBIC AND ANAEROBIC BOTTLES    Culture STAPHYLOCOCCUS AUREUS  Final   Report Status 01/23/2016 FINAL  Final   Organism ID, Bacteria STAPHYLOCOCCUS AUREUS  Final      Susceptibility    Staphylococcus aureus - MIC*    CIPROFLOXACIN <=0.5 SENSITIVE Sensitive     ERYTHROMYCIN <=0.25 SENSITIVE Sensitive     GENTAMICIN <=0.5 SENSITIVE Sensitive     OXACILLIN <=0.25 SENSITIVE Sensitive     TETRACYCLINE <=1 SENSITIVE Sensitive     VANCOMYCIN <=0.5 SENSITIVE Sensitive     TRIMETH/SULFA <=10 SENSITIVE Sensitive     CLINDAMYCIN <=0.25 SENSITIVE Sensitive     RIFAMPIN <=0.5 SENSITIVE Sensitive     Inducible Clindamycin NEGATIVE Sensitive     * STAPHYLOCOCCUS AUREUS  Culture, blood (Routine X 2) w Reflex to ID Panel     Status: None   Collection Time: 01/20/16  8:57 AM  Result Value Ref Range Status   Specimen Description BLOOD LEFT FOREARM  Final   Special Requests BOTTLES DRAWN AEROBIC ONLY 10CC  Final   Culture  Setup Time   Final    GRAM POSITIVE COCCI IN CLUSTERS CRITICAL RESULT CALLED TO, READ BACK BY AND VERIFIED WITH: TO SWHITEHORN(RN) BY TCLEVELAND 01/21/2016 AT 2:33AM AEROBIC BOTTLE ONLY    Culture   Final    STAPHYLOCOCCUS AUREUS SUSCEPTIBILITIES PERFORMED ON PREVIOUS CULTURE WITHIN THE LAST 5 DAYS.    Report Status 01/23/2016 FINAL  Final  Culture, blood (routine x 2)     Status: None (Preliminary result)  Collection Time: 01/22/16  9:13 AM  Result Value Ref Range Status   Specimen Description BLOOD RIGHT ANTECUBITAL  Final   Special Requests BOTTLES DRAWN AEROBIC AND ANAEROBIC 5CC  Final   Culture NO GROWTH 2 DAYS  Final   Report Status PENDING  Incomplete  Culture, blood (routine x 2)     Status: None (Preliminary result)   Collection Time: 01/22/16  9:19 AM  Result Value Ref Range Status   Specimen Description BLOOD BLOOD RIGHT FOREARM  Final   Special Requests BOTTLES DRAWN AEROBIC AND ANAEROBIC 5CC  Final   Culture NO GROWTH 2 DAYS  Final   Report Status PENDING  Incomplete     Studies: No results found.   Scheduled Meds: . ALPRAZolam  0.125 mg Oral BID  . amLODipine  2.5 mg Oral Daily  . apixaban  2.5 mg Oral BID  . atorvastatin  20 mg  Oral Daily  .  ceFAZolin (ANCEF) IV  2 g Intravenous 3 times per day  . famotidine  20 mg Oral BID  . ferrous sulfate  325 mg Oral BID WC  . folic acid  1 mg Oral Daily  . hydrocortisone cream   Topical BID  . loratadine  10 mg Oral Daily  . multivitamin with minerals  1 tablet Oral Daily  . oxybutynin  15 mg Oral QHS  . potassium chloride SA  20 mEq Oral Daily  . tamsulosin  0.4 mg Oral Daily  . tiZANidine  2 mg Oral TID   Continuous Infusions: . sodium chloride 100 mL/hr at 01/25/16 1015   PRN Meds: calamine, fluticasone, oxyCODONE-acetaminophen, traMADol  Time spent: 30 minutes  Author: Berle Mull, MD [facility] Pager: [phone] 01/25/2016 1:02 PM  If 7PM-7AM, please contact night-coverage at [email or web address], password [facility]

## 2016-01-26 ENCOUNTER — Inpatient Hospital Stay (HOSPITAL_COMMUNITY): Payer: Medicare Other

## 2016-01-26 DIAGNOSIS — Z954 Presence of other heart-valve replacement: Secondary | ICD-10-CM

## 2016-01-26 DIAGNOSIS — Z7901 Long term (current) use of anticoagulants: Secondary | ICD-10-CM

## 2016-01-26 LAB — CBC WITH DIFFERENTIAL/PLATELET
BASOS PCT: 0 %
Basophils Absolute: 0 10*3/uL (ref 0.0–0.1)
EOS ABS: 0.1 10*3/uL (ref 0.0–0.7)
Eosinophils Relative: 1 %
HCT: 28.3 % — ABNORMAL LOW (ref 39.0–52.0)
Hemoglobin: 10.3 g/dL — ABNORMAL LOW (ref 13.0–17.0)
LYMPHS ABS: 1 10*3/uL (ref 0.7–4.0)
Lymphocytes Relative: 7 %
MCH: 33.7 pg (ref 26.0–34.0)
MCHC: 36.4 g/dL — AB (ref 30.0–36.0)
MCV: 92.5 fL (ref 78.0–100.0)
MONO ABS: 1 10*3/uL (ref 0.1–1.0)
MONOS PCT: 7 %
Neutro Abs: 12.1 10*3/uL — ABNORMAL HIGH (ref 1.7–7.7)
Neutrophils Relative %: 85 %
Platelets: 248 10*3/uL (ref 150–400)
RBC: 3.06 MIL/uL — ABNORMAL LOW (ref 4.22–5.81)
RDW: 13.2 % (ref 11.5–15.5)
WBC: 14.1 10*3/uL — ABNORMAL HIGH (ref 4.0–10.5)

## 2016-01-26 LAB — SODIUM
SODIUM: 127 mmol/L — AB (ref 135–145)
SODIUM: 128 mmol/L — AB (ref 135–145)
SODIUM: 129 mmol/L — AB (ref 135–145)
Sodium: 128 mmol/L — ABNORMAL LOW (ref 135–145)
Sodium: 129 mmol/L — ABNORMAL LOW (ref 135–145)

## 2016-01-26 LAB — URIC ACID: URIC ACID, SERUM: 2.5 mg/dL — AB (ref 4.4–7.6)

## 2016-01-26 LAB — OSMOLALITY: OSMOLALITY: 264 mosm/kg — AB (ref 275–295)

## 2016-01-26 MED ORDER — SODIUM CHLORIDE 0.9% FLUSH: 10.0000 mL | INTRAVENOUS | Status: DC | PRN

## 2016-01-26 NOTE — Progress Notes (Signed)
[facility] Progress Note  Patient: Joseph Garner [id number]   PCP: Wenda Low, MD DOB: 11/09/1923   DOA: 01/19/2016   DOS: 01/26/2016   Date of Service: the patient was seen and examined on 01/26/2016  Subjective: The patient shows some confusion on and off refuses medication as well as blood work and requires more persistent direction. Denies having any acute complaint. No cough evident. He had 2 large bowel movement this morning. Nutrition: Tolerating oral diet  Brief hospital course: Patient was admitted on 01/19/2016, with complaint of muscle spasm and fever, was found to have MSSA bacteremia. MRI negative for any acute discitis, echocardiogram negative for vegetation. TEE is negative for endocarditis. During the course the patient also has developed hyponatremia. Etiology is likely poor oral intake. Currently further plan is monitor sodium for improvement  Assessment and Plan: 1. MSSA bacteremia of unknown etiology. No evidence of endocarditis. Possibility of psoas abscess or discitis. Possibility of myositis involving the psoas muscle. Infectious diseases was consulted and the patient is on IV cefazolin and will complete 6 weeks antibiotic treatment from negative culture. Last day 03/04/2016 PICC line inserted The patient has developed a maculopapular rash which I think is more associated with MSSA bacteremia rather than an allergic reaction to cephalosporin.  2. Maculopapular rash. The patient has developed a maculopapular rash in the hospital. Currently improving with therapy and not worsening with continuation of the antibiotic This could be an allergic reaction to the cephalosporin. Although from the appearance it is less likely an allergic rash. I would use Claritin and Pepcid. Calamine lotion as well as hydrocortisone.  3. Hyponatremia. appears clinically dehydrated. Discontinue oxybutynin at present. Discontinued IV fluids yesterday. Placed on salt tablets.  4.  Chronic diastolic dysfunction. Ejection fraction 50-55%. On Lasix at home.  currently on hold. We'll monitor closely.  5. Anemia. Most likely anemia of chronic disease or anemia of poor oral intake. Continue on iron supplementation.  6. Abdominal pain. Check x-ray abdomen.  7. Possible left-sided consolidation. Accident has mention about possible left-sided consolidation although clinically the patient does not appear to have any pneumonia nor has any basal crackles. Patient is oriented antibiotics. On my review the x-ray appears to be stable from admission chest x-ray.  Activity: physical therapy SNF versus home health 24 hour observation Bowel regimen: last BM 01/26/2016 DVT Prophylaxis: subcutaneous Heparin Nutrition: Regular diet Advance goals of care discussion: Full code  HPI: As per the H and P dictated on admission, "Joseph Garner is a 82 y.o. male with h/o CHF, AVR. Patient presents to the ED with muscle spasms. These have been ongoing for months but worsening the past couple of weeks. Zanaflex daily started last week hasnt really helped. Had fall last night and went to [facility]. Given script for pain med and followed up with PCP. PCP gave demerol and sent back into ED due to ongoing pain." Procedures: Echocardiogram, TEE Consultants: Infectious disease, cardiology Antibiotics: Anti-infectives    Start     Dose/Rate Route Frequency Ordered Stop   01/25/16 0000  ceFAZolin (ANCEF) 2-4 GM/100ML-% IVPB     2 g 200 mL/hr over 30 Minutes Intravenous Every 8 hours 01/25/16 1402 03/04/16 2359   01/22/16 1400  ceFAZolin (ANCEF) IVPB 2g/100 mL premix     2 g 200 mL/hr over 30 Minutes Intravenous 3 times per day 01/22/16 1143     01/21/16 1000  vancomycin (VANCOCIN) IVPB 1000 mg/200 mL premix  Status:  Discontinued     1,000 mg 200 mL/hr  over 60 Minutes Intravenous Every 24 hours 01/20/16 1524 01/23/16 1033   01/20/16 1600  piperacillin-tazobactam (ZOSYN) IVPB 3.375 g  Status:   Discontinued     3.375 g 12.5 mL/hr over 240 Minutes Intravenous 3 times per day 01/20/16 1521 01/22/16 1142   01/20/16 0915  vancomycin (VANCOCIN) IVPB 1000 mg/200 mL premix     1,000 mg 200 mL/hr over 60 Minutes Intravenous  Once 01/20/16 0909 01/20/16 1111   01/20/16 0915  piperacillin-tazobactam (ZOSYN) IVPB 3.375 g     3.375 g 12.5 mL/hr over 240 Minutes Intravenous  Once 01/20/16 [id number] 01/20/16 1411      Family Communication: family was present at bedside, at the time of interview.  Opportunity was given to ask question and all questions were answered satisfactorily.    Disposition:  Expected discharge date: 01/27/2016 Barriers to safe discharge: improvement in sodium level more than 130 2   Intake/Output Summary (Last 24 hours) at 01/26/16 1258 Last data filed at 01/26/16 0348  Gross per 24 hour  Intake    460 ml  Output   2125 ml  Net  -1665 ml   Filed Weights   01/20/16 1500  Weight: 72.6 kg (160 lb 0.9 oz)    Objective: Physical Exam: Filed Vitals:   01/25/16 0300 01/25/16 1300 01/25/16 2043 01/26/16 0603  BP: 121/64 130/70 162/69 179/90  Pulse: 74 70 71 75  Temp: 98.3 F (36.8 C) 98.8 F (37.1 C) 98.3 F (36.8 C) 98 F (36.7 C)  TempSrc:   Oral Oral  Resp: 18 18 18 18   Height:      Weight:      SpO2: 96% 96% 100% 94%     General: Appear in no distress, improving diffuse maculopapular Rash; Oral Mucosa dry. Cardiovascular: S1 and S2 Present, no Murmur, no JVD Respiratory: Bilateral Air entry present and Clear to Auscultation, no Crackles, no wheezes Abdomen: Bowel Sound present, Soft and mild diffuse tenderness Extremities: no Pedal edema, no calf tenderness  Data Reviewed: CBC:  Recent Labs Lab 01/19/16 1542  01/21/16 0607 01/22/16 0458 01/23/16 0547 01/24/16 0430 01/25/16 0725  WBC 14.6*  < > 17.8* 13.3* 9.6 9.8 12.4*  NEUTROABS 11.0*  --  14.7* 10.5* 6.7  --  9.8*  HGB 11.4*  < > 8.8* 8.8* 8.3* 9.3* 9.6*  HCT 33.1*  < > 24.7* 25.6*  24.2* 27.1* 27.5*  MCV 95.4  < > 94.3 93.8 93.8 93.8 92.6  PLT 170  < > 133* 146* 140* 181 196  < > = values in this interval not displayed. Basic Metabolic Panel:  Recent Labs Lab 01/19/16 1912  01/22/16 0458 01/23/16 0547 01/24/16 0430 01/24/16 1428  01/25/16 0725  01/25/16 1400 01/25/16 1811 01/25/16 2343 01/26/16 0234 01/26/16 0632  NA  --   < > 130* 129* 127* 128*  < > 129*  < > 128* 126* 128* 127* 129*  K  --   < > 3.7 3.5 3.6 3.8  --  3.6  --   --   --   --   --   --   CL  --   < > 99* 100* 95* 95*  --  97*  --   --   --   --   --   --   CO2  --   < > 23 24 25 23   --  25  --   --   --   --   --   --  GLUCOSE  --   < > 114* 108* 100* 106*  --  106*  --   --   --   --   --   --   BUN  --   < > 27* 20 14 13   --  13  --   --   --   --   --   --   CREATININE  --   < > 1.22 1.10 0.92 1.00  --  0.96  --   --   --   --   --   --   CALCIUM  --   < > 7.9* 7.5* 7.7* 8.1*  --  7.9*  --   --   --   --   --   --   MG  --   --   --   --   --   --   --  2.0  --   --   --   --   --   --   PHOS 3.4  --   --   --   --   --   --   --   --   --   --   --   --   --   < > = values in this interval not displayed. Liver Function Tests:  Recent Labs Lab 01/19/16 1542 01/22/16 0458 01/25/16 0725  AST 23 28 27   ALT 16* 23 13*  ALKPHOS 61 54 75  BILITOT 1.2 0.8 0.7  PROT 6.2* 5.2* 5.3*  ALBUMIN 3.6 2.6* 2.5*   No results for input(s): LIPASE, AMYLASE in the last 168 hours. No results for input(s): AMMONIA in the last 168 hours.  Cardiac Enzymes: No results for input(s): CKTOTAL, CKMB, CKMBINDEX, TROPONINI in the last 168 hours.  BNP (last 3 results) No results for input(s): BNP in the last 8760 hours.  CBG: No results for input(s): GLUCAP in the last 168 hours.  Recent Results (from the past 240 hour(s))  MRSA PCR Screening     Status: None   Collection Time: 01/20/16  6:43 AM  Result Value Ref Range Status   MRSA by PCR NEGATIVE NEGATIVE Final    Comment:        The  GeneXpert MRSA Assay (FDA approved for NASAL specimens only), is one component of a comprehensive MRSA colonization surveillance program. It is not intended to diagnose MRSA infection nor to guide or monitor treatment for MRSA infections.   Culture, blood (Routine X 2) w Reflex to ID Panel     Status: None   Collection Time: 01/20/16  8:50 AM  Result Value Ref Range Status   Specimen Description BLOOD LEFT ANTECUBITAL  Final   Special Requests BOTTLES DRAWN AEROBIC AND ANAEROBIC 10CC  Final   Culture  Setup Time   Final    GRAM POSITIVE COCCI IN CLUSTERS CRITICAL RESULT CALLED TO, READ BACK BY AND VERIFIED WITH: TO SWHITE(RN) BY TCLEVELAND 01/21/16 AT 1:08AM IN BOTH AEROBIC AND ANAEROBIC BOTTLES    Culture STAPHYLOCOCCUS AUREUS  Final   Report Status 01/23/2016 FINAL  Final   Organism ID, Bacteria STAPHYLOCOCCUS AUREUS  Final      Susceptibility   Staphylococcus aureus - MIC*    CIPROFLOXACIN <=0.5 SENSITIVE Sensitive     ERYTHROMYCIN <=0.25 SENSITIVE Sensitive     GENTAMICIN <=0.5 SENSITIVE Sensitive     OXACILLIN <=0.25 SENSITIVE Sensitive     TETRACYCLINE <=1 SENSITIVE Sensitive  VANCOMYCIN <=0.5 SENSITIVE Sensitive     TRIMETH/SULFA <=10 SENSITIVE Sensitive     CLINDAMYCIN <=0.25 SENSITIVE Sensitive     RIFAMPIN <=0.5 SENSITIVE Sensitive     Inducible Clindamycin NEGATIVE Sensitive     * STAPHYLOCOCCUS AUREUS  Culture, blood (Routine X 2) w Reflex to ID Panel     Status: None   Collection Time: 01/20/16  8:57 AM  Result Value Ref Range Status   Specimen Description BLOOD LEFT FOREARM  Final   Special Requests BOTTLES DRAWN AEROBIC ONLY 10CC  Final   Culture  Setup Time   Final    GRAM POSITIVE COCCI IN CLUSTERS CRITICAL RESULT CALLED TO, READ BACK BY AND VERIFIED WITH: TO SWHITEHORN(RN) BY TCLEVELAND 01/21/2016 AT 2:33AM AEROBIC BOTTLE ONLY    Culture   Final    STAPHYLOCOCCUS AUREUS SUSCEPTIBILITIES PERFORMED ON PREVIOUS CULTURE WITHIN THE LAST 5 DAYS.     Report Status 01/23/2016 FINAL  Final  Culture, blood (routine x 2)     Status: None (Preliminary result)   Collection Time: 01/22/16  9:13 AM  Result Value Ref Range Status   Specimen Description BLOOD RIGHT ANTECUBITAL  Final   Special Requests BOTTLES DRAWN AEROBIC AND ANAEROBIC 5CC  Final   Culture NO GROWTH 4 DAYS  Final   Report Status PENDING  Incomplete  Culture, blood (routine x 2)     Status: None (Preliminary result)   Collection Time: 01/22/16  9:19 AM  Result Value Ref Range Status   Specimen Description BLOOD BLOOD RIGHT FOREARM  Final   Special Requests BOTTLES DRAWN AEROBIC AND ANAEROBIC 5CC  Final   Culture NO GROWTH 4 DAYS  Final   Report Status PENDING  Incomplete     Studies: Dg Chest Port 1 View  01/26/2016  CLINICAL DATA:  Central catheter placement EXAM: PORTABLE CHEST 1 VIEW COMPARISON:  January 20, 2016 FINDINGS: Central catheter tip is at the cavoatrial junction. No pneumothorax. There is consolidation in the left base. Lungs elsewhere clear. Heart is mildly enlarged with pulmonary vascularity within normal limits. Patient is status post aortic valve replacement. No adenopathy. IMPRESSION: Central catheter tip at cavoatrial junction. No pneumothorax. Left base consolidation. Lungs elsewhere clear. Stable cardiac prominence. Electronically Signed   By: Lowella Grip III M.D.   On: 01/26/2016 10:25     Scheduled Meds: . ALPRAZolam  0.125 mg Oral BID  . amLODipine  2.5 mg Oral Daily  . apixaban  2.5 mg Oral BID  . atorvastatin  20 mg Oral Daily  .  ceFAZolin (ANCEF) IV  2 g Intravenous 3 times per day  . dextromethorphan-guaiFENesin  1 tablet Oral BID  . famotidine  20 mg Oral BID  . ferrous sulfate  325 mg Oral BID WC  . folic acid  1 mg Oral Daily  . hydrocortisone cream   Topical BID  . loratadine  10 mg Oral Daily  . multivitamin with minerals  1 tablet Oral Daily  . potassium chloride SA  20 mEq Oral Daily  . sodium chloride  1 g Oral TID WC  .  tamsulosin  0.4 mg Oral Daily  . tiZANidine  2 mg Oral TID   Continuous Infusions:   PRN Meds: benzonatate, calamine, fluticasone, sodium chloride flush, traMADol  Time spent: 30 minutes  Author: Berle Mull, MD [facility] Pager: [phone] 01/26/2016 12:58 PM  If 7PM-7AM, please contact night-coverage at [email or web address], password [facility]

## 2016-01-26 NOTE — Progress Notes (Signed)
Peripherally Inserted Central Catheter/Midline Placement  The IV Nurse has discussed with the patient and/or persons authorized to consent for the patient, the purpose of this procedure and the potential benefits and risks involved with this procedure.  The benefits include less needle sticks, lab draws from the catheter and patient may be discharged home with the catheter.  Risks include, but not limited to, infection, bleeding, blood clot (thrombus formation), and puncture of an artery; nerve damage and irregular heat beat.  Alternatives to this procedure were also discussed.  PICC/Midline Placement Documentation    Information given to daughter Saul Fordyce 01/26/2016, 9:50 AM

## 2016-01-26 NOTE — Progress Notes (Signed)
OT Cancellation Note  Patient Details Name: Roley Ode MRN: [id number] DOB: 1924-02-10   Cancelled Treatment:    Reason Eval/Treat Not Completed: Patient at procedure or test/ unavailable.  Pt. Having sterile procedure in room. (PICC placement)    Will check back as able.    Janice Coffin, COTA/L 01/26/2016, 9:10 AM

## 2016-01-26 NOTE — Care Management (Addendum)
Case manager has spoken with Sherrell Puller with [place] with [facility] [phone] to be sure that everything is in place should patient discharge on Saturday. They need at least at least an hour or more prior notification of patient's discharge date,  [place] will have IV antibiotics delivered to patient's room and a nurse will come in to provide teaching to patient's daughter Hillis Range. Her number is [phone].

## 2016-01-26 NOTE — Progress Notes (Signed)
Physical Therapy Treatment Patient Details Name: Joseph Garner MRN: [id number] DOB: 05/25/1924 Today's Date: 01/26/2016    History of Present Illness Pt. admitted 01/19/16 with muscle spasms worsening in the past few weeks.  Pt. with h/o CHF, AVR, spinal stenosis.  See physicians note for complete medical history     PT Comments    Patient is progressing toward mobility goals with less pain today. Began stair training this session but pt too fatigued. Needs stair training prior to d/c with daughter present. Continue to progress as tolerated.   Follow Up Recommendations  Home health PT     Equipment Recommendations  Rolling walker with 5" wheels;3in1 (PT)    Recommendations for Other Services       Precautions / Restrictions Precautions Precautions: Fall Restrictions Weight Bearing Restrictions: No    Mobility  Bed Mobility               General bed mobility comments: OOB in chair upon arrival  Transfers Overall transfer level: Needs assistance Equipment used: Rolling walker (2 wheeled) Transfers: Sit to/from Stand Sit to Stand: Min assist;Mod assist         General transfer comment: mod A from commode without 3 in 1; assist to stand, gain balance upon standing, and for safe descent to chair; cues for hand placement and technique from recliner and commode  Ambulation/Gait Ambulation/Gait assistance: Supervision Ambulation Distance (Feet): 115 Feet Assistive device: Rolling walker (2 wheeled) Gait Pattern/deviations: Step-through pattern;Decreased stride length;Trunk flexed Gait velocity: decreased   General Gait Details: cues for posture and position of RW; pt with increased cadence and less c/o pain this session   Stairs Stairs: Yes Stairs assistance: Min assist Stair Management: No rails;Backwards;With walker Number of Stairs: 1 General stair comments: educated pt on sequencing and technique; pt became fatigued and declined any further mobility; therapist  educated daughter on sequencing and technique with demonstration on stairs with daughter practicing stabilization of RW  Wheelchair Mobility    Modified Rankin (Stroke Patients Only)       Balance Overall balance assessment: Needs assistance Sitting-balance support: Feet supported;No upper extremity supported Sitting balance-Leahy Scale: Good     Standing balance support: Bilateral upper extremity supported Standing balance-Leahy Scale: Fair                      Cognition Arousal/Alertness: Awake/alert Behavior During Therapy: WFL for tasks assessed/performed Overall Cognitive Status: Within Functional Limits for tasks assessed                      Exercises      General Comments General comments (skin integrity, edema, etc.): pt has tendency for posterior LOB with backward steps; spoke with duaghter about pt needing to use RW in her home due to balance deficits      Pertinent Vitals/Pain Pain Assessment: Faces Faces Pain Scale: Hurts a little bit Pain Location: bilat LE Pain Descriptors / Indicators: Discomfort;Tightness Pain Intervention(s): Monitored during session;Premedicated before session;Repositioned    Home Living                      Prior Function            PT Goals (current goals can now be found in the care plan section) Acute Rehab PT Goals Patient Stated Goal: none stated Progress towards PT goals: Progressing toward goals    Frequency  Min 3X/week    PT Plan Discharge plan needs to  be updated    Co-evaluation             End of Session Equipment Utilized During Treatment: Gait belt Activity Tolerance: Patient tolerated treatment well Patient left: in chair;with call bell/phone within reach;with family/visitor present     Time: [id number] PT Time Calculation (min) (ACUTE ONLY): 49 min  Charges:  $Gait Training: 23-37 mins $Therapeutic Activity: 8-22 mins                    G Codes:      Salina April, PTA Pager: [phone]   01/26/2016, 2:08 PM

## 2016-01-27 ENCOUNTER — Inpatient Hospital Stay (HOSPITAL_COMMUNITY): Payer: Medicare Other

## 2016-01-27 DIAGNOSIS — R1084 Generalized abdominal pain: Secondary | ICD-10-CM

## 2016-01-27 DIAGNOSIS — K5909 Other constipation: Secondary | ICD-10-CM

## 2016-01-27 LAB — CULTURE, BLOOD (ROUTINE X 2)
CULTURE: NO GROWTH
Culture: NO GROWTH

## 2016-01-27 LAB — CBC WITH DIFFERENTIAL/PLATELET
BASOS ABS: 0 10*3/uL (ref 0.0–0.1)
Basophils Relative: 0 %
EOS ABS: 0.4 10*3/uL (ref 0.0–0.7)
Eosinophils Relative: 3 %
HCT: 32.2 % — ABNORMAL LOW (ref 39.0–52.0)
HEMOGLOBIN: 11 g/dL — AB (ref 13.0–17.0)
LYMPHS ABS: 2.7 10*3/uL (ref 0.7–4.0)
LYMPHS PCT: 18 %
MCH: 31.7 pg (ref 26.0–34.0)
MCHC: 34.2 g/dL (ref 30.0–36.0)
MCV: 92.8 fL (ref 78.0–100.0)
Monocytes Absolute: 1.5 10*3/uL — ABNORMAL HIGH (ref 0.1–1.0)
Monocytes Relative: 10 %
NEUTROS PCT: 69 %
Neutro Abs: 10.7 10*3/uL — ABNORMAL HIGH (ref 1.7–7.7)
PLATELETS: 326 10*3/uL (ref 150–400)
RBC: 3.47 MIL/uL — AB (ref 4.22–5.81)
RDW: 13.1 % (ref 11.5–15.5)
WBC: 15.3 10*3/uL — AB (ref 4.0–10.5)

## 2016-01-27 LAB — COMPREHENSIVE METABOLIC PANEL
ALK PHOS: 87 U/L (ref 38–126)
ALT: 15 U/L — AB (ref 17–63)
AST: 51 U/L — AB (ref 15–41)
Albumin: 3 g/dL — ABNORMAL LOW (ref 3.5–5.0)
Anion gap: 10 (ref 5–15)
BUN: 13 mg/dL (ref 6–20)
CALCIUM: 8.3 mg/dL — AB (ref 8.9–10.3)
CHLORIDE: 94 mmol/L — AB (ref 101–111)
CO2: 22 mmol/L (ref 22–32)
CREATININE: 1.1 mg/dL (ref 0.61–1.24)
GFR calc Af Amer: >60 mL/min (ref >60)
GFR calc non Af Amer: 57 mL/min — ABNORMAL LOW (ref >60)
GLUCOSE: 119 mg/dL — AB (ref 65–99)
Potassium: 3.5 mmol/L (ref 3.5–5.1)
SODIUM: 126 mmol/L — AB (ref 135–145)
Total Bilirubin: 1 mg/dL (ref 0.3–1.2)
Total Protein: 6.3 g/dL — ABNORMAL LOW (ref 6.5–8.1)

## 2016-01-27 LAB — SODIUM, URINE, RANDOM: SODIUM UR: 74 mmol/L

## 2016-01-27 LAB — SODIUM
Sodium: 127 mmol/L — ABNORMAL LOW (ref 135–145)
Sodium: 125 mmol/L — ABNORMAL LOW (ref 135–145)

## 2016-01-27 LAB — OSMOLALITY, URINE: Osmolality, Ur: 330 mOsm/kg (ref 300–900)

## 2016-01-27 MED ORDER — BISACODYL 10 MG RE SUPP: 10.0000 mg | Freq: Every day | RECTAL | Status: DC | PRN

## 2016-01-27 MED ORDER — IOHEXOL 300 MG/ML  SOLN
50.0000 mL | INTRAMUSCULAR | Status: AC
  Administered 2016-01-27 (×2): 50 mL via ORAL

## 2016-01-27 MED ORDER — POLYETHYLENE GLYCOL 3350 17 G PO PACK
17.0000 g | PACK | Freq: Two times a day (BID) | ORAL | Status: DC
  Administered 2016-01-27 – 2016-01-28 (×4): 17 g via ORAL
  Filled 2016-01-27 (×5): qty 1

## 2016-01-27 MED ORDER — SENNOSIDES-DOCUSATE SODIUM 8.6-50 MG PO TABS
1.0000 | ORAL_TABLET | Freq: Two times a day (BID) | ORAL | Status: DC
  Administered 2016-01-27 – 2016-01-29 (×5): 1 via ORAL
  Filled 2016-01-27 (×5): qty 1

## 2016-01-27 MED ORDER — IOPAMIDOL (ISOVUE-300) INJECTION 61%
INTRAVENOUS | Status: AC
  Administered 2016-01-27: 100 mL
  Filled 2016-01-27: qty 100

## 2016-01-27 NOTE — Progress Notes (Signed)
Physical Therapy Treatment Patient Details Name: Joseph Garner MRN: [id number] DOB: [birth year] Today's Date: 01/27/2016    History of Present Illness Pt. admitted 01/19/16 with muscle spasms worsening in the past few weeks.  Pt. with h/o CHF, AVR, spinal stenosis.  See physicians note for complete medical history     PT Comments    Patient declined further mobility after sit to stand due to pain in the "backs of his legs". New c/o pain in abdomen today. Educated pt on benefits of OOB mobility and he verbalized understanding. Therapist spoke with RN about nursing staff ambulating pt today if he is agreeable.   Follow Up Recommendations  Home health PT     Equipment Recommendations  Rolling walker with 5" wheels;3in1 (PT)    Recommendations for Other Services       Precautions / Restrictions Precautions Precautions: Fall Restrictions Weight Bearing Restrictions: No    Mobility  Bed Mobility               General bed mobility comments: OOB in chair upon arrival  Transfers Overall transfer level: Needs assistance Equipment used: Rolling walker (2 wheeled) Transfers: Sit to/from Stand Sit to Stand: Min assist         General transfer comment: assist to power up into standing with pt using momentum and assist for safe desend to recliner with max cues for hand placement; pt continues to keep hands on RW when descending despite cues   Ambulation/Gait             General Gait Details: pt declined ambulation due to c/o pain in "backs of legs"    Stairs            Wheelchair Mobility    Modified Rankin (Stroke Patients Only)       Balance     Sitting balance-Leahy Scale: Good       Standing balance-Leahy Scale: Poor                      Cognition Arousal/Alertness: Awake/alert Behavior During Therapy: WFL for tasks assessed/performed Overall Cognitive Status: Within Functional Limits for tasks assessed                       Exercises      General Comments General comments (skin integrity, edema, etc.): pt educated on benefits of mobility and importance of continuing to ambulate as much as possible with assistance      Pertinent Vitals/Pain Pain Assessment: Faces Faces Pain Scale: Hurts whole lot Pain Location: stomach; bilat LE (mostly calf/ankle) Pain Descriptors / Indicators: Grimacing;Guarding;Moaning;Sore Pain Intervention(s): Limited activity within patient's tolerance;Monitored during session;Premedicated before session;Repositioned    Home Living                      Prior Function            PT Goals (current goals can now be found in the care plan section) Acute Rehab PT Goals Patient Stated Goal: none stated Progress towards PT goals: Progressing toward goals    Frequency  Min 3X/week    PT Plan Discharge plan needs to be updated    Co-evaluation             End of Session Equipment Utilized During Treatment: Gait belt Activity Tolerance: Patient limited by pain Patient left: in chair;with call bell/phone within reach;with family/visitor present;with nursing/sitter in room     Time: [id number] PT Time  Calculation (min) (ACUTE ONLY): 23 min  Charges:  $Therapeutic Activity: 23-37 mins                    G Codes:      Salina April, PTA Pager: [phone]   01/27/2016, 11:54 AM

## 2016-01-27 NOTE — Progress Notes (Signed)
Patient with bladder discomfort upon return from CT.  CT result showed urinary retention, on call NP Schorr notified. Order to perform in/out cath.  Bladder scan prior to cath revealed greater than 930ml.  In/out cath obtained 1446ml of clear yellow urine.  Urine specimen collected per order.  Results pending.  Will continue to monitor.

## 2016-01-27 NOTE — Progress Notes (Signed)
[facility] Progress Note  Patient: Joseph Garner [id number]   PCP: Joseph Low, MD DOB: [birth year]   DOA: 01/19/2016   DOS: 01/27/2016   Date of Service: the patient was seen and examined on 01/27/2016  Subjective: This morning the patient has been complaining of abdominal distention as well as increasing abdominal pain. He mentions last time and he had similar pain he had diverticulitis. He denies having any nausea or vomiting. Nutrition: poor oral diet  Brief hospital course: Patient was admitted on 01/19/2016, with complaint of muscle spasm and fever, was found to have MSSA bacteremia. MRI negative for any acute discitis, echocardiogram negative for vegetation. TEE is negative for endocarditis. During the course the patient also has developed hyponatremia. Etiology is likely poor oral intake. Currently further plan is monitor sodium for improvement  Assessment and Plan: 1. MSSA bacteremia of unknown etiology. No evidence of endocarditis. Possibility of psoas abscess or discitis. Possibility of myositis involving the psoas muscle. Infectious diseases was consulted and the patient is on IV cefazolin and will complete 6 weeks antibiotic treatment from negative culture. Last day 03/04/2016 PICC line inserted The patient has developed a maculopapular rash which I think is more associated with MSSA bacteremia rather than an allergic reaction to cephalosporin.  2. Maculopapular rash. The patient has developed a maculopapular rash in the hospital. Currently improving with therapy and not worsening with continuation of the antibiotic This could be an allergic reaction to the cephalosporin. although it is improving rapidly with use Claritin and Pepcid.Calamine lotion as well as hydrocortisone. less likely allergic reaction  3. Hyponatremia. Osmolality on lower side, uric acid level also Garner. This suggest hypervolemia hyponatremia. Urine sodium urine osmolality pending. Discontinue  oxybutynin at present.   start on fluid restricted diet, continue salt tablets. May need to reintroduce Lasix depending on response of sodium.  4. Chronic diastolic dysfunction. Ejection fraction 50-55%. On Lasix at home.  currently on hold. We'll monitor closely.  5. Anemia. Most likely anemia of chronic disease or anemia of poor oral intake. Continue on iron supplementation.  6. Abdominal pain.  x-ray abdomen does not show any evidence of acute pathology. Shows evidence of constipation. With significant abdominal distention and tightening with worsening leukocytosis I will data a CT abdomen and pelvis with contrast again to rule out any intra-abdominal pathology.  7. Possible left-sided consolidation. Chest x-ray has mention about possible left-sided consolidation although clinically the patient does not appear to have any pneumonia nor has any basal crackles. Patient is on antibiotics. On my review the x-ray appears to be stable from admission chest x-ray.  Activity: physical therapy SNF versus home health 24 hour observation Bowel regimen: last BM 01/26/2016 DVT Prophylaxis: subcutaneous Heparin Nutrition: Regular diet with fluid restriction  Advance goals of care discussion: Full code  HPI: As per the H and P dictated on admission, "Joseph Garner is a 82 y.o. male with h/o CHF, AVR. Patient presents to the ED with muscle spasms. These have been ongoing for months but worsening the past couple of weeks. Zanaflex daily started last week hasnt really helped. Had fall last night and went to [facility]. Given script for pain med and followed up with PCP. PCP gave demerol and sent back into ED due to ongoing pain." Procedures: Echocardiogram, TEE Consultants: Infectious disease, cardiology Antibiotics: Anti-infectives    Start     Dose/Rate Route Frequency Ordered Stop   01/25/16 0000  ceFAZolin (ANCEF) 2-4 GM/100ML-% IVPB     2 g 200 mL/hr over  30 Minutes Intravenous Every 8 hours  01/25/16 1402 03/04/16 2359   01/22/16 1400  ceFAZolin (ANCEF) IVPB 2g/100 mL premix     2 g 200 mL/hr over 30 Minutes Intravenous 3 times per day 01/22/16 1143     01/21/16 1000  vancomycin (VANCOCIN) IVPB 1000 mg/200 mL premix  Status:  Discontinued     1,000 mg 200 mL/hr over 60 Minutes Intravenous Every 24 hours 01/20/16 1524 01/23/16 1033   01/20/16 1600  piperacillin-tazobactam (ZOSYN) IVPB 3.375 g  Status:  Discontinued     3.375 g 12.5 mL/hr over 240 Minutes Intravenous 3 times per day 01/20/16 1521 01/22/16 1142   01/20/16 0915  vancomycin (VANCOCIN) IVPB 1000 mg/200 mL premix     1,000 mg 200 mL/hr over 60 Minutes Intravenous  Once 01/20/16 0909 01/20/16 1111   01/20/16 0915  piperacillin-tazobactam (ZOSYN) IVPB 3.375 g     3.375 g 12.5 mL/hr over 240 Minutes Intravenous  Once 01/20/16 [id number] 01/20/16 1411      Family Communication: family was present at bedside, at the time of interview.  Opportunity was given to ask question and all questions were answered satisfactorily.    Disposition:  Expected discharge date: 01/29/2016 Barriers to safe discharge: improvement in sodium level more than 130 2,  workup related to the abdominal pain   No intake or output data in the 24 hours ending 01/27/16 1741 Filed Weights   01/20/16 1500  Weight: 72.6 kg (160 lb 0.9 oz)    Objective: Physical Exam: Filed Vitals:   01/26/16 2113 01/27/16 0527 01/27/16 1126 01/27/16 1352  BP: 150/71 158/76 158/78 148/80  Pulse: 97 67  80  Temp: 97.7 F (36.5 C) 98.1 F (36.7 C)  98.4 F (36.9 C)  TempSrc: Oral Oral    Resp: 18 18  19   Height:      Weight:      SpO2: 99% 100%  99%    General: Appear in no distress, improving diffuse maculopapular Rash; Oral Mucosa dry. Cardiovascular: S1 and S2 Present, aortic systolic murmur Respiratory: Bilateral Air entry present and Clear to Auscultation, no Crackles, no wheezes Abdomen: Bowel Sound present, Soft and  distention with diffuse  tenderness Extremities: no Pedal edema, no calf tenderness  Data Reviewed: CBC:  Recent Labs Lab 01/22/16 0458 01/23/16 0547 01/24/16 0430 01/25/16 0725 01/26/16 1235 01/27/16 0450  WBC 13.3* 9.6 9.8 12.4* 14.1* 15.3*  NEUTROABS 10.5* 6.7  --  9.8* 12.1* 10.7*  HGB 8.8* 8.3* 9.3* 9.6* 10.3* 11.0*  HCT 25.6* 24.2* 27.1* 27.5* 28.3* 32.2*  MCV 93.8 93.8 93.8 92.6 92.5 92.8  PLT 146* 140* 181 196 248 [id number]   Basic Metabolic Panel:  Recent Labs Lab 01/23/16 0547 01/24/16 0430 01/24/16 1428  01/25/16 0725  01/26/16 0632 01/26/16 1235 01/26/16 2305 01/27/16 0450 01/27/16 1352  NA 129* 127* 128*  < > 129*  < > 129* 128* 129* 126* 127*  K 3.5 3.6 3.8  --  3.6  --   --   --   --  3.5  --   CL 100* 95* 95*  --  97*  --   --   --   --  94*  --   CO2 24 25 23   --  25  --   --   --   --  22  --   GLUCOSE 108* 100* 106*  --  106*  --   --   --   --  119*  --  BUN 20 14 13   --  13  --   --   --   --  13  --   CREATININE 1.10 0.92 1.00  --  0.96  --   --   --   --  1.10  --   CALCIUM 7.5* 7.7* 8.1*  --  7.9*  --   --   --   --  8.3*  --   MG  --   --   --   --  2.0  --   --   --   --   --   --   < > = values in this interval not displayed. Liver Function Tests:  Recent Labs Lab 01/22/16 0458 01/25/16 0725 01/27/16 0450  AST 28 27 51*  ALT 23 13* 15*  ALKPHOS 54 75 87  BILITOT 0.8 0.7 1.0  PROT 5.2* 5.3* 6.3*  ALBUMIN 2.6* 2.5* 3.0*   No results for input(s): LIPASE, AMYLASE in the last 168 hours. No results for input(s): AMMONIA in the last 168 hours.  Cardiac Enzymes: No results for input(s): CKTOTAL, CKMB, CKMBINDEX, TROPONINI in the last 168 hours.  BNP (last 3 results) No results for input(s): BNP in the last 8760 hours.  CBG: No results for input(s): GLUCAP in the last 168 hours.  Recent Results (from the past 240 hour(s))  MRSA PCR Screening     Status: None   Collection Time: 01/20/16  6:43 AM  Result Value Ref Range Status   MRSA by PCR NEGATIVE  NEGATIVE Final    Comment:        The GeneXpert MRSA Assay (FDA approved for NASAL specimens only), is one component of a comprehensive MRSA colonization surveillance program. It is not intended to diagnose MRSA infection nor to guide or monitor treatment for MRSA infections.   Culture, blood (Routine X 2) w Reflex to ID Panel     Status: None   Collection Time: 01/20/16  8:50 AM  Result Value Ref Range Status   Specimen Description BLOOD LEFT ANTECUBITAL  Final   Special Requests BOTTLES DRAWN AEROBIC AND ANAEROBIC 10CC  Final   Culture  Setup Time   Final    GRAM POSITIVE COCCI IN CLUSTERS CRITICAL RESULT CALLED TO, READ BACK BY AND VERIFIED WITH: TO SWHITE(RN) BY TCLEVELAND 01/21/16 AT 1:08AM IN BOTH AEROBIC AND ANAEROBIC BOTTLES    Culture STAPHYLOCOCCUS AUREUS  Final   Report Status 01/23/2016 FINAL  Final   Organism ID, Bacteria STAPHYLOCOCCUS AUREUS  Final      Susceptibility   Staphylococcus aureus - MIC*    CIPROFLOXACIN <=0.5 SENSITIVE Sensitive     ERYTHROMYCIN <=0.25 SENSITIVE Sensitive     GENTAMICIN <=0.5 SENSITIVE Sensitive     OXACILLIN <=0.25 SENSITIVE Sensitive     TETRACYCLINE <=1 SENSITIVE Sensitive     VANCOMYCIN <=0.5 SENSITIVE Sensitive     TRIMETH/SULFA <=10 SENSITIVE Sensitive     CLINDAMYCIN <=0.25 SENSITIVE Sensitive     RIFAMPIN <=0.5 SENSITIVE Sensitive     Inducible Clindamycin NEGATIVE Sensitive     * STAPHYLOCOCCUS AUREUS  Culture, blood (Routine X 2) w Reflex to ID Panel     Status: None   Collection Time: 01/20/16  8:57 AM  Result Value Ref Range Status   Specimen Description BLOOD LEFT FOREARM  Final   Special Requests BOTTLES DRAWN AEROBIC ONLY 10CC  Final   Culture  Setup Time   Final    GRAM POSITIVE COCCI IN CLUSTERS CRITICAL  RESULT CALLED TO, READ BACK BY AND VERIFIED WITH: TO SWHITEHORN(RN) BY TCLEVELAND 01/21/2016 AT 2:33AM AEROBIC BOTTLE ONLY    Culture   Final    STAPHYLOCOCCUS AUREUS SUSCEPTIBILITIES PERFORMED ON PREVIOUS  CULTURE WITHIN THE LAST 5 DAYS.    Report Status 01/23/2016 FINAL  Final  Culture, blood (routine x 2)     Status: None   Collection Time: 01/22/16  9:13 AM  Result Value Ref Range Status   Specimen Description BLOOD RIGHT ANTECUBITAL  Final   Special Requests BOTTLES DRAWN AEROBIC AND ANAEROBIC 5CC  Final   Culture NO GROWTH 5 DAYS  Final   Report Status 01/27/2016 FINAL  Final  Culture, blood (routine x 2)     Status: None   Collection Time: 01/22/16  9:19 AM  Result Value Ref Range Status   Specimen Description BLOOD BLOOD RIGHT FOREARM  Final   Special Requests BOTTLES DRAWN AEROBIC AND ANAEROBIC 5CC  Final   Culture NO GROWTH 5 DAYS  Final   Report Status 01/27/2016 FINAL  Final     Studies: No results found.   Scheduled Meds: . ALPRAZolam  0.125 mg Oral BID  . amLODipine  2.5 mg Oral Daily  . apixaban  2.5 mg Oral BID  . atorvastatin  20 mg Oral Daily  .  ceFAZolin (ANCEF) IV  2 g Intravenous 3 times per day  . dextromethorphan-guaiFENesin  1 tablet Oral BID  . famotidine  20 mg Oral BID  . ferrous sulfate  325 mg Oral BID WC  . folic acid  1 mg Oral Daily  . hydrocortisone cream   Topical BID  . iopamidol      . loratadine  10 mg Oral Daily  . multivitamin with minerals  1 tablet Oral Daily  . polyethylene glycol  17 g Oral BID  . potassium chloride SA  20 mEq Oral Daily  . senna-docusate  1 tablet Oral BID  . sodium chloride  1 g Oral TID WC  . tamsulosin  0.4 mg Oral Daily  . tiZANidine  2 mg Oral TID   Continuous Infusions:   PRN Meds: benzonatate, bisacodyl, calamine, fluticasone, sodium chloride flush, traMADol  Time spent: 30 minutes  Author: Berle Mull, MD [facility] Pager: [phone] 01/27/2016 5:41 PM  If 7PM-7AM, please contact night-coverage at [email or web address], password [facility]

## 2016-01-28 DIAGNOSIS — I484 Atypical atrial flutter: Secondary | ICD-10-CM

## 2016-01-28 DIAGNOSIS — M7981 Nontraumatic hematoma of soft tissue: Secondary | ICD-10-CM

## 2016-01-28 DIAGNOSIS — R339 Retention of urine, unspecified: Secondary | ICD-10-CM

## 2016-01-28 DIAGNOSIS — Z951 Presence of aortocoronary bypass graft: Secondary | ICD-10-CM

## 2016-01-28 LAB — SODIUM
SODIUM: 129 mmol/L — AB (ref 135–145)
SODIUM: 127 mmol/L — AB (ref 135–145)
Sodium: 127 mmol/L — ABNORMAL LOW (ref 135–145)
Sodium: 133 mmol/L — ABNORMAL LOW (ref 135–145)

## 2016-01-28 MED ORDER — FUROSEMIDE 10 MG/ML IJ SOLN: 20.0000 mg | Freq: Once | INTRAMUSCULAR | Status: DC

## 2016-01-28 MED ORDER — FUROSEMIDE 20 MG PO TABS
20.0000 mg | ORAL_TABLET | Freq: Every day | ORAL | Status: DC
  Administered 2016-01-28: 20 mg via ORAL
  Filled 2016-01-28: qty 1

## 2016-01-28 NOTE — Progress Notes (Signed)
Bladder scan prior to cath revealed greater than 973ml. In/out cath obtained 1650 of clear yellow urine. Will continue to monitor.  Lulia Schriner, Tivis Ringer, RN

## 2016-01-28 NOTE — Progress Notes (Signed)
Bladder scan prior to cath revealed 437ml. In/out cath obtained 466ml of clear yellow urine. Will continue to monitor. MD made aware  Jeb Schloemer, Tivis Ringer, RN

## 2016-01-28 NOTE — Progress Notes (Signed)
[facility] Progress Note  Patient: Joseph Garner [id number]   PCP: Wenda Low, MD DOB: 03/07/1924   DOA: 01/19/2016   DOS: 01/28/2016   Date of Service: the patient was seen and examined on 01/28/2016  Subjective: The patient continues to complain of some abdominal pain but improving. Denies having any nausea or vomiting. Occasionally showing signs of confusion. Was in the bathroom this morning for more than one hour and would not come out. Did have significant bladder retention yesterday. Nutrition: poor oral diet  Brief hospital course: Patient was admitted on 01/19/2016, with complaint of muscle spasm and fever, was found to have MSSA bacteremia. MRI negative for any acute discitis, echocardiogram negative for vegetation. TEE is negative for endocarditis. During the course the patient also has developed hyponatremia. Etiology is likely poor oral intake. Significant urinary retention on CT scan. Currently further plan is monitor sodium for improvement  Assessment and Plan: 1. MSSA bacteremia of unknown etiology. No evidence of endocarditis. Possibility of psoas abscess or discitis. Repeat CT scan shows Psoas hematoma without any evidence of abscess. Infectious diseases was consulted and the patient is on IV cefazolin and will complete 6 weeks antibiotic treatment from negative culture.  Last day 03/04/2016 PICC line inserted  2. Maculopapular rash. The patient has developed a maculopapular rash in the hospital.  Currently improving with therapy and not worsening with continuation of the antibiotic Continue Claritin and Pepcid.Calamine lotion as well as hydrocortisone. less likely allergic reaction  3. Hyponatremia. Osmolality on lower side, uric acid level also low. This suggest hypervolemia hyponatremia. Urine sodium urine osmolality normal Discontinue oxybutynin at present.  Continue on fluid restricted diet, continue salt tablets. Lasix 20 mg daily  4. Chronic diastolic  dysfunction. Ejection fraction 50-55%. On Lasix at home.  Lasix 20 mg daily resume  5. Anemia. Most likely anemia of chronic disease or anemia of poor oral intake. Continue on iron supplementation.  6. Abdominal pain. Psoas hematoma Bladder retention  x-ray abdomen does not show any evidence of acute pathology. Shows evidence of constipation. With significant abdominal distention and tightening with worsening leukocytosis, CT abdomen and pelvis with contrast was performed. Which shows better attention without any acute abnormality. Psoas hematoma is the likely cause of patient's persistent abdominal pain which would improve with time.  7. Possible left-sided consolidation. Chest x-ray has mention about possible left-sided consolidation although clinically the patient does not appear to have any pneumonia nor has any basal crackles. Patient is on antibiotics. On my review the x-ray appears to be stable from admission chest x-ray.  8. Urinary retention. BPH. Patient is on Flomax and oxybutynin. Since oxybutynin can cause dry mouth which internal cause polydipsia as well as sputum and can cause urinary retention I would discontinue it. Continue Flomax. Bladder scan every 8 hours and monitor today for retention  Activity: physical therapy home health 24 hour observation Bowel regimen: last BM 01/26/2016, on miralax and senokot DVT Prophylaxis: On therapeutic anticoagulation Nutrition: Cardiac diet with fluid restriction  Advance goals of care discussion: Full code  HPI: As per the H and P dictated on admission, "Joseph Garner is a 82 y.o. male with h/o CHF, AVR. Patient presents to the ED with muscle spasms. These have been ongoing for months but worsening the past couple of weeks. Zanaflex daily started last week hasnt really helped. Had fall last night and went to [facility]. Given script for pain med and followed up with PCP. PCP gave demerol and sent back into ED due to  ongoing  pain." Procedures: Echocardiogram, TEE Consultants: Infectious disease, cardiology Antibiotics: Anti-infectives    Start     Dose/Rate Route Frequency Ordered Stop   01/25/16 0000  ceFAZolin (ANCEF) 2-4 GM/100ML-% IVPB     2 g 200 mL/hr over 30 Minutes Intravenous Every 8 hours 01/25/16 1402 03/04/16 2359   01/22/16 1400  ceFAZolin (ANCEF) IVPB 2g/100 mL premix     2 g 200 mL/hr over 30 Minutes Intravenous 3 times per day 01/22/16 1143     01/21/16 1000  vancomycin (VANCOCIN) IVPB 1000 mg/200 mL premix  Status:  Discontinued     1,000 mg 200 mL/hr over 60 Minutes Intravenous Every 24 hours 01/20/16 1524 01/23/16 1033   01/20/16 1600  piperacillin-tazobactam (ZOSYN) IVPB 3.375 g  Status:  Discontinued     3.375 g 12.5 mL/hr over 240 Minutes Intravenous 3 times per day 01/20/16 1521 01/22/16 1142   01/20/16 0915  vancomycin (VANCOCIN) IVPB 1000 mg/200 mL premix     1,000 mg 200 mL/hr over 60 Minutes Intravenous  Once 01/20/16 0909 01/20/16 1111   01/20/16 0915  piperacillin-tazobactam (ZOSYN) IVPB 3.375 g     3.375 g 12.5 mL/hr over 240 Minutes Intravenous  Once 01/20/16 [id number] 01/20/16 1411      Family Communication: family was present at bedside, at the time of interview.  Opportunity was given to ask question and all questions were answered satisfactorily.    Disposition:  Expected discharge date: 01/29/2016 Barriers to safe discharge: improvement in sodium level more than 130 2,Improvement in bladder retention   Intake/Output Summary (Last 24 hours) at 01/28/16 0926 Last data filed at 01/28/16 0917  Gross per 24 hour  Intake    240 ml  Output   1400 ml  Net  -1160 ml   Filed Weights   01/20/16 1500  Weight: 72.6 kg (160 lb 0.9 oz)    Objective: Physical Exam: Filed Vitals:   01/27/16 1126 01/27/16 1352 01/27/16 1948 01/28/16 0544  BP: 158/78 148/80 151/76 140/67  Pulse:  80 77 79  Temp:  98.4 F (36.9 C) 97.8 F (36.6 C) 97.7 F (36.5 C)  TempSrc:   Oral Oral   Resp:  19 18 18   Height:      Weight:      SpO2:  99% 98% 99%    General: Appear in no distress, improving diffuse maculopapular Rash; Oral Mucosa dry. Cardiovascular: S1 and S2 Present, aortic systolic murmur Respiratory: Bilateral Air entry present and Clear to Auscultation, no Crackles, no wheezes Abdomen: Bowel Sound present, Soft and improved distention with diffuse tenderness Extremities: trace Pedal edema, no calf tenderness  Data Reviewed: CBC:  Recent Labs Lab 01/22/16 0458 01/23/16 0547 01/24/16 0430 01/25/16 0725 01/26/16 1235 01/27/16 0450  WBC 13.3* 9.6 9.8 12.4* 14.1* 15.3*  NEUTROABS 10.5* 6.7  --  9.8* 12.1* 10.7*  HGB 8.8* 8.3* 9.3* 9.6* 10.3* 11.0*  HCT 25.6* 24.2* 27.1* 27.5* 28.3* 32.2*  MCV 93.8 93.8 93.8 92.6 92.5 92.8  PLT 146* 140* 181 196 248 [id number]   Basic Metabolic Panel:  Recent Labs Lab 01/23/16 0547 01/24/16 0430 01/24/16 1428  01/25/16 0725  01/27/16 0450 01/27/16 1352 01/27/16 1850 01/28/16 0033 01/28/16 0536  NA 129* 127* 128*  < > 129*  < > 126* 127* 125* 127* 129*  K 3.5 3.6 3.8  --  3.6  --  3.5  --   --   --   --   CL 100* 95* 95*  --  97*  --  94*  --   --   --   --   CO2 24 25 23   --  25  --  22  --   --   --   --   GLUCOSE 108* 100* 106*  --  106*  --  119*  --   --   --   --   BUN 20 14 13   --  13  --  13  --   --   --   --   CREATININE 1.10 0.92 1.00  --  0.96  --  1.10  --   --   --   --   CALCIUM 7.5* 7.7* 8.1*  --  7.9*  --  8.3*  --   --   --   --   MG  --   --   --   --  2.0  --   --   --   --   --   --   < > = values in this interval not displayed. Liver Function Tests:  Recent Labs Lab 01/22/16 0458 01/25/16 0725 01/27/16 0450  AST 28 27 51*  ALT 23 13* 15*  ALKPHOS 54 75 87  BILITOT 0.8 0.7 1.0  PROT 5.2* 5.3* 6.3*  ALBUMIN 2.6* 2.5* 3.0*   No results for input(s): LIPASE, AMYLASE in the last 168 hours. No results for input(s): AMMONIA in the last 168 hours.  Cardiac Enzymes: No results for  input(s): CKTOTAL, CKMB, CKMBINDEX, TROPONINI in the last 168 hours.  BNP (last 3 results) No results for input(s): BNP in the last 8760 hours.  CBG: No results for input(s): GLUCAP in the last 168 hours.  Recent Results (from the past 240 hour(s))  MRSA PCR Screening     Status: None   Collection Time: 01/20/16  6:43 AM  Result Value Ref Range Status   MRSA by PCR NEGATIVE NEGATIVE Final    Comment:        The GeneXpert MRSA Assay (FDA approved for NASAL specimens only), is one component of a comprehensive MRSA colonization surveillance program. It is not intended to diagnose MRSA infection nor to guide or monitor treatment for MRSA infections.   Culture, blood (Routine X 2) w Reflex to ID Panel     Status: None   Collection Time: 01/20/16  8:50 AM  Result Value Ref Range Status   Specimen Description BLOOD LEFT ANTECUBITAL  Final   Special Requests BOTTLES DRAWN AEROBIC AND ANAEROBIC 10CC  Final   Culture  Setup Time   Final    GRAM POSITIVE COCCI IN CLUSTERS CRITICAL RESULT CALLED TO, READ BACK BY AND VERIFIED WITH: TO SWHITE(RN) BY TCLEVELAND 01/21/16 AT 1:08AM IN BOTH AEROBIC AND ANAEROBIC BOTTLES    Culture STAPHYLOCOCCUS AUREUS  Final   Report Status 01/23/2016 FINAL  Final   Organism ID, Bacteria STAPHYLOCOCCUS AUREUS  Final      Susceptibility   Staphylococcus aureus - MIC*    CIPROFLOXACIN <=0.5 SENSITIVE Sensitive     ERYTHROMYCIN <=0.25 SENSITIVE Sensitive     GENTAMICIN <=0.5 SENSITIVE Sensitive     OXACILLIN <=0.25 SENSITIVE Sensitive     TETRACYCLINE <=1 SENSITIVE Sensitive     VANCOMYCIN <=0.5 SENSITIVE Sensitive     TRIMETH/SULFA <=10 SENSITIVE Sensitive     CLINDAMYCIN <=0.25 SENSITIVE Sensitive     RIFAMPIN <=0.5 SENSITIVE Sensitive     Inducible Clindamycin NEGATIVE Sensitive     * STAPHYLOCOCCUS AUREUS  Culture, blood (Routine X 2) w Reflex to ID Panel     Status: None   Collection Time: 01/20/16  8:57 AM  Result Value Ref Range Status    Specimen Description BLOOD LEFT FOREARM  Final   Special Requests BOTTLES DRAWN AEROBIC ONLY 10CC  Final   Culture  Setup Time   Final    GRAM POSITIVE COCCI IN CLUSTERS CRITICAL RESULT CALLED TO, READ BACK BY AND VERIFIED WITH: TO SWHITEHORN(RN) BY TCLEVELAND 01/21/2016 AT 2:33AM AEROBIC BOTTLE ONLY    Culture   Final    STAPHYLOCOCCUS AUREUS SUSCEPTIBILITIES PERFORMED ON PREVIOUS CULTURE WITHIN THE LAST 5 DAYS.    Report Status 01/23/2016 FINAL  Final  Culture, blood (routine x 2)     Status: None   Collection Time: 01/22/16  9:13 AM  Result Value Ref Range Status   Specimen Description BLOOD RIGHT ANTECUBITAL  Final   Special Requests BOTTLES DRAWN AEROBIC AND ANAEROBIC 5CC  Final   Culture NO GROWTH 5 DAYS  Final   Report Status 01/27/2016 FINAL  Final  Culture, blood (routine x 2)     Status: None   Collection Time: 01/22/16  9:19 AM  Result Value Ref Range Status   Specimen Description BLOOD BLOOD RIGHT FOREARM  Final   Special Requests BOTTLES DRAWN AEROBIC AND ANAEROBIC 5CC  Final   Culture NO GROWTH 5 DAYS  Final   Report Status 01/27/2016 FINAL  Final     Studies: Ct Abdomen Pelvis W Contrast  01/27/2016  ADDENDUM REPORT: 01/27/2016 20:04 ADDENDUM: Enlargement and slightly increased attenuation of the distal LEFT iliopsoas muscle/tendon, raising question of small intramuscular hematoma. Iliacus muscles appear fairly symmetric but LEFT psoas muscle appears minimally enlarged in the paraspinal region as well as well. Minimal fluid at the base of the LEFT pericolic gutter does not appear to represent blood but is low in attenuation. Electronically Signed   By: Lavonia Dana M.D.   On: 01/27/2016 20:04  01/27/2016  CLINICAL DATA:  Abdominal pain EXAM: CT ABDOMEN AND PELVIS WITH CONTRAST TECHNIQUE: Multidetector CT imaging of the abdomen and pelvis was performed using the standard protocol following bolus administration of intravenous contrast. Sagittal and coronal MPR images  reconstructed from axial data set. CONTRAST:  100 ISOVUE-300 IOPAMIDOL (ISOVUE-300) INJECTION 61% IV. Dilute oral contrast. COMPARISON:  01/23/2016 FINDINGS: Small bibasilar pleural effusions now atelectasis greater on RIGHT. Small LEFT lobe hepatic cyst 18 x 14 mm image 39. Liver, gallbladder, spleen, pancreas, and RIGHT adrenal gland normal. Minimal nodularity of LEFT adrenal gland appear stable BILATERAL mild hydronephrosis new since previous exam though the urinary bladder is significantly distended, region umbilicus, [id number] x 0000000 x 12.7 cm with an estimated volume 1466 mL. Marked enlargement of prostate gland, 6.5 x 5.5 x 6.7 cm question causing bladder outlet obstruction. Small amount of free pelvic fluid adjacent to bladder. Appendix surgically absent by history. Distal colonic diverticulosis without evidence of diverticulitis. Stomach and bowel loops otherwise unremarkable. No mass, adenopathy, or free air. Scattered atherosclerotic calcifications without aneurysm. Question tiny RIGHT inguinal hernia containing fat. Mild degenerative disc and facet disease changes lumbar spine. IMPRESSION: Marked bladder distention estimated at 1466 mL with associated dilatation of the renal collecting systems. Significant prostate enlargement 6.5 x 5.5 x 6.7 cm question causing bladder outlet obstruction. Small bibasilar pleural effusions and atelectasis. Distal colonic diverticulosis without evidence of diverticulitis. Electronically Signed: By: Lavonia Dana M.D. On: 01/27/2016 18:58     Scheduled Meds: . ALPRAZolam  0.125 mg  Oral BID  . amLODipine  2.5 mg Oral Daily  . apixaban  2.5 mg Oral BID  . atorvastatin  20 mg Oral Daily  .  ceFAZolin (ANCEF) IV  2 g Intravenous 3 times per day  . dextromethorphan-guaiFENesin  1 tablet Oral BID  . famotidine  20 mg Oral BID  . ferrous sulfate  325 mg Oral BID WC  . folic acid  1 mg Oral Daily  . hydrocortisone cream   Topical BID  . loratadine  10 mg Oral Daily  .  multivitamin with minerals  1 tablet Oral Daily  . polyethylene glycol  17 g Oral BID  . potassium chloride SA  20 mEq Oral Daily  . senna-docusate  1 tablet Oral BID  . sodium chloride  1 g Oral TID WC  . tamsulosin  0.4 mg Oral Daily  . tiZANidine  2 mg Oral TID   Continuous Infusions:   PRN Meds: benzonatate, bisacodyl, calamine, fluticasone, sodium chloride flush, traMADol  Time spent: 30 minutes  Author: Berle Mull, MD [facility] Pager: [phone] 01/28/2016 9:26 AM  If 7PM-7AM, please contact night-coverage at [email or web address], password  [facility]

## 2016-01-28 NOTE — Progress Notes (Signed)
Patient had multiple episodes of incontinence this shift. Bladder scan at 6:00 am is 150 Ml. Patient denies discomfort and asked to be helped to the bathroom during shift change.

## 2016-01-29 LAB — SODIUM: Sodium: 131 mmol/L — ABNORMAL LOW (ref 135–145)

## 2016-01-29 MED ORDER — HEPARIN SOD (PORK) LOCK FLUSH 100 UNIT/ML IV SOLN
250.0000 [IU] | INTRAVENOUS | Status: AC | PRN
  Administered 2016-01-29: 250 [IU]

## 2016-01-29 MED ORDER — TIZANIDINE HCL 2 MG PO TABS: 2.0000 mg | ORAL_TABLET | Freq: Three times a day (TID) | ORAL | Status: DC

## 2016-01-29 MED ORDER — BENZONATATE 100 MG PO CAPS: 100.0000 mg | ORAL_CAPSULE | Freq: Three times a day (TID) | ORAL | Status: DC | PRN

## 2016-01-29 MED ORDER — BISACODYL 10 MG RE SUPP: 10.0000 mg | Freq: Every day | RECTAL | Status: DC | PRN

## 2016-01-29 MED ORDER — FINASTERIDE 5 MG PO TABS: 5.0000 mg | ORAL_TABLET | Freq: Every day | ORAL | Status: DC

## 2016-01-29 MED ORDER — SENNOSIDES-DOCUSATE SODIUM 8.6-50 MG PO TABS: 1.0000 | ORAL_TABLET | Freq: Every evening | ORAL | Status: DC | PRN

## 2016-01-29 MED ORDER — FAMOTIDINE 20 MG PO TABS: 20.0000 mg | ORAL_TABLET | Freq: Two times a day (BID) | ORAL | Status: DC

## 2016-01-29 MED ORDER — SODIUM CHLORIDE 1 G PO TABS: 1.0000 g | ORAL_TABLET | Freq: Three times a day (TID) | ORAL | Status: DC

## 2016-01-29 MED ORDER — HYDROCORTISONE 0.5 % EX CREA: TOPICAL_CREAM | Freq: Two times a day (BID) | CUTANEOUS | Status: AC

## 2016-01-29 MED ORDER — DM-GUAIFENESIN ER 30-600 MG PO TB12: 1.0000 | ORAL_TABLET | Freq: Two times a day (BID) | ORAL | Status: DC

## 2016-01-29 MED ORDER — POLYETHYLENE GLYCOL 3350 17 G PO PACK: 17.0000 g | PACK | Freq: Every day | ORAL | Status: DC

## 2016-01-29 NOTE — Progress Notes (Addendum)
Contacted Adam with [facility] at 9:30am and informed him that patient's daughter would be available at 10:30 am for IV teaching. Received call from [place] at [facility] that they had not received rx for IV ancef. Refaxed rx for ancef and home health orders and received confirmation from [place] that fax was received. Ancef was delivered by [facility] to patient's daughter and she received a lesson on giving IV ancef so that she will give PM dose this evening at her home. Patient's daughter requested 3N1 be delivered to her home through [facility] [phone]. Contacted Zack at [facility] and requested 3N1 be delivered to daughter's home [place] , [place] [id number]. Faxed order and demographics, received confirmation of fax received. They will deliver 3N1 to daughter's home, no charge. Informed patient's daughter.Home Health Social Worker added to home health order in case patient's daughter decides to pursue SNF. Currently patient refusing SNF, daughter still wants patient at her home. Contacted [facility] and informed them social worker added and faxed home health orders and face to face [phone] and received confirmation. Informed them that patient being d/c today and will need RN visit 12/30/15 am.Faxed discharge summary.        01/31/16 On 01/30/16 faxed discharge summary to Gay Filler at [facility] fax [phone]. Confirmed with Gay Filler that d/c summary was received and that they has received orders for HPT, HHOT and Education officer, museum.  [facility] is sister agency of [facility] and is providing home health for patient in [place] # [phone].

## 2016-01-29 NOTE — Progress Notes (Addendum)
Pt ready for d/c home to daughter's house per MD. Discharge teaching and prescriptions were extensively reviewed with pt and his daughter, all questions answered. PICC was capped for home by IV team after 2 pm dose of ancef. Belongings gathered and sent with pt's daughter. Pt assisted to car by NT. Physician discharge summary faxed to PCP in [facility] office.   [place], Jerry Caras

## 2016-01-29 NOTE — Progress Notes (Signed)
Occupational Therapy Treatment and Discharge Patient Details Name: Joseph Garner MRN: [id number] DOB: 1924/06/01 Today's Date: 01/29/2016    History of present illness Pt. admitted 01/19/16 with muscle spasms worsening in the past few weeks; was found to have MSSA bacteremia.  Pt. with h/o CHF, AVR, spinal stenosis.  See physicians note for complete medical history    OT comments  This 82 yo male admitted with above presents to acute OT making progress and will be going to dtr's house and have 24/7 S with prn A . Pt to D/C today so will D/C acute OT with follow up [place].  Follow Up Recommendations  Home health OT;Supervision/Assistance - 24 hour    Equipment Recommendations  3 in 1 bedside comode       Precautions / Restrictions Precautions Precautions: Fall Restrictions Weight Bearing Restrictions: No       Mobility Bed Mobility               General bed mobility comments: OOB in chair upon arrival  Transfers Overall transfer level: Needs assistance Equipment used: Rolling walker (2 wheeled) Transfers: Sit to/from Stand Sit to Stand: Min assist                  ADL Overall ADL's : Needs assistance/impaired     Grooming: Oral care;Set up;Sitting;Supervision/safety   Upper Body Bathing: Set up;Supervision/ safety;Sitting   Lower Body Bathing: Moderate assistance (min A sit<>stand)   Upper Body Dressing : Minimal assistance;Sitting   Lower Body Dressing: Moderate assistance (min A sit<>stand)   Toilet Transfer: Minimal assistance;Ambulation;RW;BSC (in bathroom)   Toileting- Clothing Manipulation and Hygiene: Minimal assistance;Sit to/from stand         General ADL Comments: Pt's dtr in room and she has a tub/shower set up at her home in [place] ( where pt is going). Pt is getting HHOT so I explaned to the dtr that [facility] could address tub equipment. Also explained to dtr that when he is dressing he needs to put his LLE in first.                 Cognition   Behavior During Therapy: [facility] for tasks assessed/performed Overall Cognitive Status:  (slow to process)                                    Pertinent Vitals/ Pain       Pain Assessment: 0-10 Pain Score: 3  Pain Location: Left lower back Pain Descriptors / Indicators: Guarding Pain Intervention(s): Monitored during session;Repositioned         Frequency Min 2X/week     Progress Toward Goals  OT Goals(current goals can now be found in the care plan section)  Progress towards OT goals: Progressing toward goals     Plan Discharge plan remains appropriate       End of Session Equipment Utilized During Treatment: Gait belt;Rolling walker   Activity Tolerance Patient tolerated treatment well   Patient Left in chair;with call bell/phone within reach;with chair alarm set   Nurse Communication Mobility status (deciding with family on leg bag v. foley bag)        Time: [id number] OT Time Calculation (min): 46 min  Charges: OT General Charges $OT Visit: 1 Procedure OT Treatments $Self Care/Home Management : 38-52 mins  Almon Register [id number] 01/29/2016, 12:17 PM

## 2016-01-29 NOTE — Care Management Important Message (Signed)
Important Message  Patient Details  Name: Joseph Garner MRN: [id number] Date of Birth: Jan 25, 1924   Medicare Important Message Given:  Yes    [name] 01/29/2016, 1:30 PM

## 2016-01-29 NOTE — Progress Notes (Signed)
Physical Therapy Treatment Patient Details Name: Joseph Garner MRN: [id number] DOB: Mar 03, 1924 Today's Date: 01/29/2016    History of Present Illness Pt. admitted 01/19/16 with muscle spasms worsening in the past few weeks.  Pt. with h/o CHF, AVR, spinal stenosis.  See physicians note for complete medical history     PT Comments    Patient is not progressing toward PT goals and was unable to complete stair training this session. Daughter present and actively participating in session. Due to pt's current mobility level, recommending SNF for further skilled PT services to maximize independence and safety with mobility.   Follow Up Recommendations  SNF;Supervision for mobility/OOB     Equipment Recommendations  Rolling walker with 5" wheels;3in1 (PT)    Recommendations for Other Services       Precautions / Restrictions Precautions Precautions: Fall Restrictions Weight Bearing Restrictions: No    Mobility  Bed Mobility               General bed mobility comments: OOB in chair upon arrival  Transfers Overall transfer level: Needs assistance Equipment used: Rolling walker (2 wheeled) Transfers: Sit to/from Stand Sit to Stand: Min assist         General transfer comment: assist to power up into standing with increased time and cues for hand placement; pt continues to keep hands on RW when descending to chair despite max cues  Ambulation/Gait Ambulation/Gait assistance: Min assist Ambulation Distance (Feet): 12 Feet (6x2) Assistive device: Rolling walker (2 wheeled) Gait Pattern/deviations: Step-through pattern;Decreased stance time - left;Trunk flexed Gait velocity: decreased   General Gait Details: cues for posture and position of RW   Stairs   Stairs assistance: Min assist Stair Management: No rails;Backwards;With walker Number of Stairs: 1 General stair comments: educated pt and daughter on sequencing and techniqe with demonstration by therapist, verbal  instruction and handout; pt unable to ascend more than one step due to c/o back spasms and declined any further mobiltiy  Wheelchair Mobility    Modified Rankin (Stroke Patients Only)       Balance     Sitting balance-Leahy Scale: Good       Standing balance-Leahy Scale: Poor                      Cognition Arousal/Alertness: Awake/alert Behavior During Therapy: WFL for tasks assessed/performed Overall Cognitive Status: Within Functional Limits for tasks assessed                      Exercises      General Comments General comments (skin integrity, edema, etc.): discussed using w/c to ascend/descend steps with pt's daughter and given handout for reference      Pertinent Vitals/Pain Pain Assessment: Faces Pain Score: 3  Faces Pain Scale: Hurts whole lot Pain Location: lower back Pain Descriptors / Indicators: Grimacing;Guarding;Moaning;Spasm;Sore Pain Intervention(s): Limited activity within patient's tolerance;Monitored during session;Premedicated before session;Repositioned    Home Living                      Prior Function            PT Goals (current goals can now be found in the care plan section) Acute Rehab PT Goals Patient Stated Goal: none stated Progress towards PT goals: Not progressing toward goals - comment    Frequency  Min 3X/week    PT Plan Discharge plan needs to be updated    Co-evaluation  End of Session Equipment Utilized During Treatment: Gait belt Activity Tolerance: Patient limited by pain Patient left: in chair;with call bell/phone within reach;with family/visitor present;with nursing/sitter in room     Time: 1205-1248 PT Time Calculation (min) (ACUTE ONLY): 43 min  Charges:  $Gait Training: 8-22 mins $Therapeutic Activity: 23-37 mins                    G Codes:      Salina April, PTA Pager: [phone]   01/29/2016, 3:31 PM

## 2016-01-29 NOTE — Discharge Summary (Addendum)
[facility] Discharge Summary   Patient: Joseph Garner [id number]   PCP: Joseph Low, MD DOB: 1924-04-15   Date of admission: 01/19/2016   Date of discharge:  01/29/2016    Discharge Diagnoses:  Principal Problem:   Bacteremia Active Problems:   Atrial flutter ([place])   S/P AVR   S/P CABG x 1   Hyponatremia   Chronic diastolic heart failure (HCC)   Muscle spasms of both lower extremities   CKD (chronic kidney disease) stage 3, GFR 30-59 ml/min   Bilateral Garner back pain without sciatica   Urinary retention   Nontraumatic psoas hematoma  Recommendations for Outpatient Follow-up:  1. Patient is scheduled to follow-up with PCP in [place] for establishing care as well as regular follow-up. Recommend check CBC and BMP in 1 week 2. Recommend PCP to refer the patient for infectious disease for management of the infection as well as PICC line 3. Also recommend the patient be treated for the patient to urologist for management of BPH causing acute urinary retention requiring Foley catheter  4. Home health agency for the patient will [facility] 5. Nursing as well as Education officer, museum consult was also placed on discharge  Follow-up Information    Schedule an appointment as soon as possible for a visit in 1 week to follow up.   Why:  with Joseph Cola MD, to establish care and follow up.      Please follow up.   Why:  urology follow up in 1 week to get voiding trial and manage foley catheter for BPH.      Follow up with Joseph Low, MD. Call in 1 week.   Specialty:  Internal Medicine   Why:  As needed   Contact information:   [address]   [id number] [phone]      Diet recommendation: Cardiac diet  Activity: The patient is advised to gradually reintroduce usual activities.  Discharge Condition: good  History of present illness: As per the H and P dictated on admission, "Joseph Garner is a 82 y.o. male with h/o CHF,  AVR. Patient presents to the ED with muscle spasms. These have been ongoing for months but worsening the past couple of weeks. Zanaflex daily started last week hasnt really helped. Had fall last night and went to [facility]. Given script for pain med and followed up with PCP. PCP gave demerol and sent back into ED due to ongoing pain. "  Hospital Course:  Patient was admitted on 01/19/2016, with complaint of muscle spasm and fever, was found to have MSSA bacteremia. MRI negative for any acute discitis, echocardiogram negative for vegetation. TEE is negative for endocarditis. During the course the patient also has developed a maculopapular rash, hyponatremia and Significant urinary retention on CT scan.  Summary of his active problems in the hospital is as following. 1. MSSA bacteremia of unknown etiology. No evidence of endocarditis. Possibility of psoas abscess or discitis. TEE and echocardiogram negative for any vegetation. Infectious diseases was consulted and the patient is on IV cefazolin and will complete 6 weeks antibiotic treatment from negative culture.  Last day 03/04/2016. PICC line inserted. Repeated CT scan shows Psoas hematoma without any evidence of abscess.  Patient will need to establish care with outpatient infectious disease on discharge to monitor antibiotics.  2. Maculopapular rash. The patient has developed a maculopapular rash in the hospital.  Currently improving with therapy and not worsening with continuation of the antibiotic Continue Claritin and  Pepcid. less likely allergic reaction  3. Hyponatremia. Osmolality 264 on lower side, uric acid 2.5 level also Garner. Urine sodium 74, urine osmolality 330 normal Discontinue oxybutynin at present.  Continue on fluid restricted diet, continue salt tablets. Lasix 20 mg daily as needed for weight gain  4. Chronic diastolic dysfunction. Ejection fraction 50-55%. Essential hypertension Continue amlodipine and Lasix 20 mg  daily as needed for weight gain  5. Anemia. Most likely anemia of chronic disease and anemia of nutritional deficiency Continue on iron supplementation.  6. Abdominal pain. Psoas hematoma Bladder retention x-ray abdomen does not show any evidence of acute pathology. Shows evidence of constipation. Patient will be discharged on bowel regimen.  Psoas hematoma is the likely cause of patient's persistent abdominal pain which would improve with time, and on 2 repeat a CT scan, did not show any significant increase and remained stable.  At present continue anticoagulation due to his A. fib.  7. Possible left-sided consolidation. Chest x-ray has mention about possible left-sided consolidation although clinically the patient does not appear to have any pneumonia nor has any basal crackles. Patient is on antibiotics. On my review the x-ray appears to be stable from admission chest x-ray.  8. Urinary retention. BPH. Patient is on Flomax and oxybutynin. Since oxybutynin can cause dry mouth which internal cause polydipsia as well as sputum and can cause urinary retention I would discontinue it. Continue Flomax. Add finasteride. He has urologist here in [place] but will need follow-up with urologist in [place] as well in 1-2 weeks regarding voiding trial  9. Constipation. Bowel regimen ordered on discharge  All other chronic medical condition were stable during the hospitalization.  Patient was seen by physical therapy, who recommended home health versus 24-hour supervision versus SNF, I discussed with case manager who in turn  discussed with the family as well as Education officer, museum.  At present while the patient is being discharged at the [facility] with his daughter, home health has been arranged as well as Education officer, museum on discharge. Daughter would like to see how the patient is progressing and will discuss with social worker whether the patient needs a facility or not at a later  date at [facility].  On the day of the discharge the patient's vitals were stable, and no other acute medical condition were reported by patient. the patient was felt safe to be discharge at home with home health and family support.  Procedures and Results:   Echocardiogram Study Conclusions  - Left ventricle: The cavity size was normal. Wall thickness was  increased in a pattern of moderate LVH. Systolic function was  normal. The estimated ejection fraction was in the range of 50%  to 55%. Wall motion was normal; there were no regional wall  motion abnormalities. - Aortic valve: Normal appearing bioprosthetic valve with no  abscess or perivalvular regurgitation - Mitral valve: There was mild regurgitation. - Right atrium: The atrium was mildly dilated. - Atrial septum: No defect or patent foramen ovale was identified. - Pulmonary arteries: PA peak pressure: 36 mm Hg (S).   TEE Study Conclusions  - Left ventricle: The cavity size was normal. Wall thickness was  increased in a pattern of mild LVH. The estimated ejection  fraction was 55%. Wall motion was normal; there were no regional  wall motion abnormalities. - Aortic valve: Bioprosthetic aortic valve appeared to function  normally, no significant regurgitation, mean gradient 13 mmHg. No  evidence of vegetation. - Aorta: Mildly dilated ascending  aorta to 4.2 cm. Mild plaque in  descending thoracic aorta. - Mitral valve: No evidence of vegetation. There was mild  regurgitation. - Left atrium: The atrium was mildly dilated. - Right ventricle: The cavity size was mildly dilated. Systolic  function was normal. - Right atrium: The atrium was mildly dilated. - Atrial septum: No defect or patent foramen ovale was identified. - Tricuspid valve: No evidence of vegetation. There was  mild-moderate regurgitation. RV-RA gradient 31 mmHg. - Pulmonic valve: No evidence of vegetation.  Impressions:  - There  was no evidence of a vegetation.    PICC line placement  Consultations:  Infectious disease  Cardiology  DISCHARGE MEDICATION: Current Discharge Medication List    START taking these medications   Details  benzonatate (TESSALON) 100 MG capsule Take 1 capsule (100 mg total) by mouth 3 (three) times daily as needed for cough. Qty: 20 capsule, Refills: 0    bisacodyl (DULCOLAX) 10 MG suppository Place 1 suppository (10 mg total) rectally daily as needed for moderate constipation. Qty: 12 suppository, Refills: 0    ceFAZolin (ANCEF) 2-4 GM/100ML-% IVPB Inject 100 mLs (2 g total) into the vein every 8 (eight) hours. Qty: 38 each, Refills: 0    finasteride (PROSCAR) 5 MG tablet Take 1 tablet (5 mg total) by mouth daily. Qty: 30 tablet, Refills: 0    hydrocortisone cream 0.5 % Apply topically 2 (two) times daily. Qty: 30 g, Refills: 0    polyethylene glycol (MIRALAX / GLYCOLAX) packet Take 17 g by mouth daily. Qty: 14 each, Refills: 0    senna-docusate (SENOKOT-S) 8.6-50 MG tablet Take 1 tablet by mouth at bedtime as needed for mild constipation. Qty: 30 tablet, Refills: 0    sodium chloride 1 g tablet Take 1 tablet (1 g total) by mouth 3 (three) times daily with meals. Qty: 10 tablet, Refills: 0      CONTINUE these medications which have CHANGED   Details  tiZANidine (ZANAFLEX) 2 MG tablet Take 1 tablet (2 mg total) by mouth 3 (three) times daily. Qty: 30 tablet, Refills: 0      CONTINUE these medications which have NOT CHANGED   Details  ALPRAZolam (XANAX) 0.25 MG tablet Take 0.125 mg by mouth 2 (two) times daily. 1/2 TABLET TWICE DAILY    amLODipine (NORVASC) 2.5 MG tablet Take 2.5 mg by mouth daily.   Associated Diagnoses: Chronic diastolic heart failure (HCC)    apixaban (ELIQUIS) 2.5 MG TABS tablet Take 1 tablet (2.5 mg total) by mouth 2 (two) times daily. Qty: 60 tablet, Refills: 11    atorvastatin (LIPITOR) 20 MG tablet Take 20 mg by mouth daily at 6 PM.       ferrous sulfate 325 (65 FE) MG tablet Take 325 mg by mouth daily with breakfast.    fluticasone (FLONASE) 50 MCG/ACT nasal spray Place 2 sprays into the nose daily as needed for allergies.     folic acid (FOLVITE) 1 MG tablet Take 1 tablet (1 mg total) by mouth daily.    furosemide (LASIX) 40 MG tablet Take 20 mg by mouth as needed for fluid or edema (Patient takes 1/2 pill).     loratadine (CLARITIN) 10 MG tablet Take 10 mg by mouth daily as needed for allergies.     !! Multiple Vitamin (MULTIVITAMIN WITH MINERALS) TABS Take 1 tablet by mouth daily.    !! Multiple Vitamins-Minerals (EYE VITAMINS PO) Take 1 tablet by mouth daily. Perservision Vitamin    omeprazole (PRILOSEC) 20 MG  capsule Take 20 mg by mouth daily.    potassium chloride SA (K-DUR,KLOR-CON) 20 MEQ tablet Take 1 tablet (20 mEq total) by mouth daily. Qty: 30 tablet, Refills: 12    tamsulosin (FLOMAX) 0.4 MG CAPS capsule Take 0.4 mg by mouth daily.     traMADol (ULTRAM) 50 MG tablet Take by mouth every 6 (six) hours as needed for moderate pain. Reported on 12/11/2015    HYDROcodone-acetaminophen (NORCO/VICODIN) 5-325 MG tablet Take 1 tablet by mouth every 6 (six) hours as needed. Qty: 15 tablet, Refills: 0     !! - Potential duplicate medications found. Please discuss with provider.    STOP taking these medications     oxybutynin (DITROPAN XL) 15 MG 24 hr tablet        No Known Allergies Discharge Instructions    AMB Referral to [facility]    Complete by:  As directed   Please assign to [facility] for transition of care calls only while patient is in [place] with his daughter. His daughter is primary contact at [employer] [phone]. He will be in [facility] for several weeks with daughter as primary caregiver. Please see notes. Consent signed. History of CHF. Discussed this plan with [facility] manager. Likely discharge today 01/29/16. Please call with questions. Marthenia Rolling, [place], [facility] [id number]  Reason for consult:  Please assign to [place]  Diagnoses of:  Heart Failure  Expected date of contact:  1-3 days (reserved for hospital discharges)     D[facility] Heart    Complete by:  As directed      Discharge instructions    Complete by:  As directed   It is important that you read following instructions as well as go over your medication list with RN to help you understand your care after this hospitalization.  Discharge Instructions: Please follow-up with PCP in one week with sodium recheck and referral to infectious disease at [facility].  Please follow up with urology in 1-2 week.  Please request your primary care physician to go over all Hospital Tests and Procedure/Radiological results at the follow up,  Please get all Hospital records sent to your PCP by signing hospital release before you go home.   Do not take more than prescribed Pain, Sleep and Anxiety Medications. You were cared for by a hospitalist during your hospital stay. If you have any questions about your discharge medications or the care you received while you were in the hospital after you are discharged, you can call the unit and ask to speak with the hospitalist on call if the hospitalist that took care of you is not available.  Once you are discharged, your primary care physician will handle any further medical issues. Please note that NO REFILLS for any discharge medications will be authorized once you are discharged, as it is imperative that you return to your primary care physician (or establish a relationship with a primary care physician if you do not have one) for your aftercare needs so that they can reassess your need for medications and monitor your lab values. You Must read complete instructions/literature along with all the possible adverse reactions/side effects for all the Medicines you take and that have been prescribed to you. Take any new Medicines  after you have completely understood and accept all the possible adverse reactions/side effects. Wear Seat belts while driving.     Increase activity slowly    Complete by:  As directed  Discharge Exam: Filed Weights   01/20/16 1500  Weight: 72.6 kg (160 lb 0.9 oz)   Filed Vitals:   01/28/16 2002 01/29/16 0557  BP: 97/54 134/67  Pulse: 76 82  Temp: 98.1 F (36.7 C) 98.2 F (36.8 C)  Resp: 16 16   General: Appear in mild distress, IMPROVING Rash; Oral Mucosa moist. Cardiovascular: S1 and S2 Present, aortic systolic Murmur, no JVD Respiratory: Bilateral Air entry present and Clear to Auscultation, no Crackles, no wheezes Abdomen: Bowel Sound present, Soft and no tenderness Extremities: trace Pedal edema, no calf tenderness Neurology: Grossly no focal neuro deficit.  The results of significant diagnostics from this hospitalization (including imaging, microbiology, ancillary and laboratory) are listed below for reference.    Significant Diagnostic Studies: Dg Lumbar Spine Complete  01/19/2016  CLINICAL DATA:  82 year old male with fall and back pain. EXAM: LUMBAR SPINE - COMPLETE 4+ VIEW COMPARISON:  None. FINDINGS: There are 6 non rib bearing vertebrae. The uppermost vertebrae is numbered as L1 for the purposes of this study. There is no acute fracture or subluxation. The vertebral body heights are maintained. There is grade 1 L2-L3 retrolisthesis. There is degenerative changes with disc space narrowing most prominent at L2-L3 and L4-L5. The visualized transverse and spinous processes appear intact. The soft tissues are grossly unremarkable. There is atherosclerotic calcification of the abdominal aorta. IMPRESSION: No acute/traumatic lumbar spine pathology. Electronically Signed   By: Anner Crete M.D.   On: 01/19/2016 03:44   Mr Lumbar Spine W Wo Contrast  01/22/2016  CLINICAL DATA:  Worsening Garner back pain, leg spasms, and weakness. Symptoms present for 3 months but  worse over the last 2 weeks. Fever and bacteremia. EXAM: MRI LUMBAR SPINE WITHOUT AND WITH CONTRAST TECHNIQUE: Multiplanar and multiecho pulse sequences of the lumbar spine were obtained without and with intravenous contrast. CONTRAST:  31mL MULTIHANCE GADOBENATE DIMEGLUMINE 529 MG/ML IV SOLN COMPARISON:  Lumbar spine MRI 08/20/2008. CTA chest, abdomen, and pelvis 05/20/2013. FINDINGS: Transitional lumbosacral anatomy is again noted. Numbering employed on the prior MRI will be continued on the current examination, with S1 considered partially lumbarized and with a relatively fully formed S1-2 disc space present. There is mild lumbar levoscoliosis. There is trace retrolisthesis of L2 on L3. Vertebral body heights are preserved. Moderate disc space narrowing at L2-3 is new from the prior MRI but was present on the more recent CTA. There is a small Schmorl's node type deformity involving the L2 inferior endplate which may have slightly increased in size from the prior CTA but is not completely new. There is progressive, moderate disc space narrowing at L4-5. Mild STIR hyperintensity and mild endplate edema and enhancement are present at both L2-3 and L4-5 without frank endplate destructive changes identified. There is rounded/tubular Garner signal intensity on both axial T1 and T2 and coronal T1 sequences centrally in the left psoas muscle, with mild enhancement along its superior aspect. This measures up to 1.6 cm in diameter on axial images. There is mild-to-moderate marrow and surrounding soft tissue edema about the L3-4 and L4-5 facet joints, greater on the right. Advanced facet arthrosis was demonstrated on the prior CTA. An L4 vertebral body hemangioma is noted. The conus medullaris is normal in signal and terminates at L2. A small cyst is noted in the lower pole of the right kidney. L1-2: Small left central disc extrusion appears slightly smaller than on the prior study. There is also mild disc bulging and mild  facet hypertrophy. No significant stenosis. L2-3:  New circumferential disc bulging and mild facet and ligamentum flavum hypertrophy result in mild bilateral neural foraminal narrowing without significant spinal stenosis. L3-4: Disc bulging asymmetric to the right, ligamentum flavum hypertrophy, and moderate to severe right and mild-to-moderate left facet hypertrophy result in new mild spinal stenosis, mild right greater than left lateral recess stenosis, and mild right neural foraminal stenosis. L4-5: There is advanced bilateral facet arthrosis, right worse than left and progressive from the prior MRI. There is focal lobe, heterogeneous T2 hyperintensity in the dorsal epidural space medial to the right L4-5 facet joint just below the L4-5 disc space level which appears partially rim enhancing and slightly extends superiorly in the midline dorsal epidural space at/ just above the disc space level. In total this measures approximately 2 x 1 cm. A small amount of a amorphous T2 hyperintensity was present at the medial aspect of the right facet joint on the prior MRI. The advanced facet arthrosis, ligamentum flavum hypertrophy, dorsal epidural material comment circumferential disc bulging result in increased, severe spinal stenosis and moderate right neural foraminal stenosis. Small bilateral facet joint effusions. L5-S1: There is progressive, severe bilateral facet arthrosis with small bilateral joint effusions. Progressive facet arthrosis, ligamentum flavum hypertrophy, and disc bulging result in increased, moderately severe spinal stenosis, left greater than right lateral recess stenosis, and mild right and moderate left neural foraminal stenosis. A 2-3 mm synovial cyst is again seen extending anteriorly from the left L5-S1 facet joint into the superior aspect of the neural foramen. Small bilateral facet joint effusions. S1-2:  No disc herniation or stenosis. IMPRESSION: 1. Mild endplate edema and enhancement at L2-3  and L4-5. While early discitis/osteomyelitis is possible, no frank endplate destruction is seen and these changes may be degenerative rather than infectious. 2. Advanced facet arthrosis from L3-4 to L5-S1. Right greater left perifacet edema may reflect acute on chronic facet arthritis with sterile, small facet joint effusions at each level, however septic arthritis is not excluded. 3. Progressive disc and facet degeneration at L4-5 resulting in severe multifactorial spinal stenosis. 2 x 1 cm peripherally enhancing structure in the dorsal epidural space at this level which may represent interval enlargement of a tiny synovial cyst from the prior study, although a small epidural abscess is also possible. 4. Increased, moderately severe spinal stenosis at L5-S1. 5. New, mild spinal stenosis at L3-4. 6. Tubular Garner signal intensity focus centrally in the left psoas muscle. This is of uncertain etiology, and while it does not demonstrate fluid signal to clearly indicate an abscess, further assessment with abdomen and pelvis CT (with IV contrast) may be warranted to exclude a fluid collection. Electronically Signed   By: Logan Bores M.D.   On: 01/22/2016 21:13   Ct Abdomen Pelvis W Contrast  01/27/2016  ADDENDUM REPORT: 01/27/2016 20:04 ADDENDUM: Enlargement and slightly increased attenuation of the distal LEFT iliopsoas muscle/tendon, raising question of small intramuscular hematoma. Iliacus muscles appear fairly symmetric but LEFT psoas muscle appears minimally enlarged in the paraspinal region as well as well. Minimal fluid at the base of the LEFT pericolic gutter does not appear to represent blood but is Garner in attenuation. Electronically Signed   By: Lavonia Dana M.D.   On: 01/27/2016 20:04  01/27/2016  CLINICAL DATA:  Abdominal pain EXAM: CT ABDOMEN AND PELVIS WITH CONTRAST TECHNIQUE: Multidetector CT imaging of the abdomen and pelvis was performed using the standard protocol following bolus administration of  intravenous contrast. Sagittal and coronal MPR images reconstructed from axial data set.  CONTRAST:  100 ISOVUE-300 IOPAMIDOL (ISOVUE-300) INJECTION 61% IV. Dilute oral contrast. COMPARISON:  01/23/2016 FINDINGS: Small bibasilar pleural effusions now atelectasis greater on RIGHT. Small LEFT lobe hepatic cyst 18 x 14 mm image 39. Liver, gallbladder, spleen, pancreas, and RIGHT adrenal gland normal. Minimal nodularity of LEFT adrenal gland appear stable BILATERAL mild hydronephrosis new since previous exam though the urinary bladder is significantly distended, region umbilicus, [id number] x 0000000 x 12.7 cm with an estimated volume 1466 mL. Marked enlargement of prostate gland, 6.5 x 5.5 x 6.7 cm question causing bladder outlet obstruction. Small amount of free pelvic fluid adjacent to bladder. Appendix surgically absent by history. Distal colonic diverticulosis without evidence of diverticulitis. Stomach and bowel loops otherwise unremarkable. No mass, adenopathy, or free air. Scattered atherosclerotic calcifications without aneurysm. Question tiny RIGHT inguinal hernia containing fat. Mild degenerative disc and facet disease changes lumbar spine. IMPRESSION: Marked bladder distention estimated at 1466 mL with associated dilatation of the renal collecting systems. Significant prostate enlargement 6.5 x 5.5 x 6.7 cm question causing bladder outlet obstruction. Small bibasilar pleural effusions and atelectasis. Distal colonic diverticulosis without evidence of diverticulitis. Electronically Signed: By: Lavonia Dana M.D. On: 01/27/2016 18:58   Ct Abdomen Pelvis W Contrast  01/23/2016  CLINICAL DATA:  Inpatient. Back pain. Indeterminate central Garner signal focus in the left psoas muscle on recent MRI lumbar spine. EXAM: CT ABDOMEN AND PELVIS WITH CONTRAST TECHNIQUE: Multidetector CT imaging of the abdomen and pelvis was performed using the standard protocol following bolus administration of intravenous contrast. CONTRAST:  100  cc Isovue-300 IV. COMPARISON:  01/22/2016 lumbar spine MRI. 05/20/2013 CT abdomen/pelvis. FINDINGS: Lower chest: Small layering bilateral pleural effusions. Cardiomegaly. Aortic valve prosthesis is partially visualized in place. Coronary atherosclerosis. Oral contrast in the lower thoracic esophagus. Mild-to-moderate compressive atelectasis in the dependent lower lobes. Hepatobiliary: Normal liver with no liver mass. No radiopaque cholelithiasis. No definite gallbladder wall thickening. No biliary ductal dilatation. Pancreas: Normal, with no mass or duct dilation. Spleen: Normal size. No mass. Adrenals/Urinary Tract: Mild irregular thickening of both adrenal glands, unchanged since 2014, in keeping with mild adrenal hyperplasia or multiple small adenomas. Subcentimeter hypodense renal cortical lesion in the upper right kidney, too small to characterize, unchanged since 05/20/2013, in keeping with a benign renal cyst. 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 Otherwise normal kidneys, with no hydronephrosis. Mildly to moderately distended bladder, which otherwise appears normal. Stomach/Bowel: Small hiatal hernia. Otherwise grossly normal stomach. Normal caliber small bowel with no small bowel wall thickening. Status post appendectomy. Mild sigmoid diverticulosis, with no large bowel wall thickening or pericolonic fat stranding. Vascular/Lymphatic: Atherosclerotic nonaneurysmal abdominal aorta. Patent portal, splenic, hepatic and renal veins. No pathologically enlarged lymph nodes in the abdomen or pelvis. Reproductive: Moderate prostatomegaly. Other: No pneumoperitoneum.  Trace perihepatic ascites. Musculoskeletal: No aggressive appearing focal osseous lesions. Moderate degenerative changes in the visualized thoracolumbar spine. There is mild asymmetric enlargement of the left psoas muscle. There is no discrete rim enhancing fluid collection in the left psoas muscle. There is a vague slightly hyperdense 1.7 x 1.6  cm focus in the central left psoas muscle, which could represent a small intramuscular hematoma. Visualized lower sternotomy wire is intact. Mild anasarca. IMPRESSION: 1. Mild asymmetric enlargement of the left psoas muscle. No discrete rim enhancing fluid collection in the left psoas muscle to suggest an abscess. Vague slightly hyperdense 1.7 x 1.6 cm focus in the central left psoas muscle, possibly a small intramuscular hematoma. 2. Small bilateral pleural effusions. Trace  perihepatic ascites. Mild anasarca. Cardiomegaly. 3. Oral contrast in the lower thoracic esophagus, suggesting esophageal dysmotility and/or gastroesophageal reflux. 4. Additional findings include coronary atherosclerosis, small hiatal hernia, moderate prostatomegaly and mild sigmoid diverticulosis. Electronically Signed   By: Ilona Sorrel M.D.   On: 01/23/2016 17:56   Dg Chest Port 1 View  01/26/2016  CLINICAL DATA:  Central catheter placement EXAM: PORTABLE CHEST 1 VIEW COMPARISON:  January 20, 2016 FINDINGS: Central catheter tip is at the cavoatrial junction. No pneumothorax. There is consolidation in the left base. Lungs elsewhere clear. Heart is mildly enlarged with pulmonary vascularity within normal limits. Patient is status post aortic valve replacement. No adenopathy. IMPRESSION: Central catheter tip at cavoatrial junction. No pneumothorax. Left base consolidation. Lungs elsewhere clear. Stable cardiac prominence. Electronically Signed   By: Lowella Grip III M.D.   On: 01/26/2016 10:25   Dg Chest Port 1 View  01/20/2016  CLINICAL DATA:  Fever. EXAM: PORTABLE CHEST 1 VIEW COMPARISON:  09/28/2015 FINDINGS: Stable changes from previous cardiac surgery and valve replacement. Cardiac silhouette is normal in size and configuration. Normal mediastinal and hilar contours. Clear lungs.  No pleural effusion or pneumothorax. Bony thorax is intact. IMPRESSION: No acute cardiopulmonary disease. Electronically Signed   By: Lajean Manes M.D.    On: 01/20/2016 21:30   Dg Shoulder Left  01/19/2016  CLINICAL DATA:  [place] to the left side on Sunday. Bruising to the left shoulder. Good range of motion. EXAM: LEFT SHOULDER - 2+ VIEW COMPARISON:  None. FINDINGS: Degenerative changes in the acromioclavicular joint. No acute fracture or dislocation in the left shoulder. Coracoclavicular and acromioclavicular spaces are maintained. There appears to be anterior soft tissue swelling which may be due to hematoma or contusion. IMPRESSION: No acute bony abnormalities. Degenerative changes. Probable soft tissue hematoma. Electronically Signed   By: Lucienne Capers M.D.   On: 01/19/2016 03:46   Dg Knee Complete 4 Views Left  01/19/2016  CLINICAL DATA:  82 year old male with fall and left knee pain. EXAM: LEFT KNEE - COMPLETE 4+ VIEW COMPARISON:  None. FINDINGS: There is no acute fracture or dislocation. There is mild osteopenia. There is osteoarthritic changes of the knee with narrowing of the medial and lateral compartments more prominent involving the medial compartment. No joint effusion identified. Surgical clips noted in the soft tissues of the posterior knee. IMPRESSION: No acute fracture or dislocation. Electronically Signed   By: Anner Crete M.D.   On: 01/19/2016 03:46   Dg Abd Portable 1v  01/26/2016  CLINICAL DATA:  Abdominal pain with constipation. EXAM: PORTABLE ABDOMEN - 1 VIEW COMPARISON:  CT 01/23/2016. FINDINGS: 1532 hours. Mild breathing artifact. There is high-density stool throughout the colon, greatest in the rectum and cecum. No bowel wall thickening or significant distention identified. Surgical clips are present within the anterior abdominal wall in the left lower quadrant. There are no suspicious abdominal calcifications. Mild convex left lumbar scoliosis. IMPRESSION: No acute abdominal findings. High-density stool throughout the colon. Electronically Signed   By: Richardean Sale M.D.   On: 01/26/2016 15:41   Dg Hip Unilat W Or W/o  Pelvis 2-3 Views Left  01/19/2016  CLINICAL DATA:  82 year old male with fall and left hip pain. EXAM: DG HIP (WITH OR WITHOUT PELVIS) 2-3V LEFT COMPARISON:  None. FINDINGS: There is no acute fracture or dislocation. There is mild osteopenia. There is mild degenerative changes of the lower lumbar spine and hip joints. Hernia repair mesh noted over the left hemipelvis. The soft tissues  are grossly unremarkable. IMPRESSION: No acute fracture or dislocation. Electronically Signed   By: Anner Crete M.D.   On: 01/19/2016 03:45    Microbiology: Recent Results (from the past 240 hour(s))  MRSA PCR Screening     Status: None   Collection Time: 01/20/16  6:43 AM  Result Value Ref Range Status   MRSA by PCR NEGATIVE NEGATIVE Final    Comment:        The GeneXpert MRSA Assay (FDA approved for NASAL specimens only), is one component of a comprehensive MRSA colonization surveillance program. It is not intended to diagnose MRSA infection nor to guide or monitor treatment for MRSA infections.   Culture, blood (Routine X 2) w Reflex to ID Panel     Status: None   Collection Time: 01/20/16  8:50 AM  Result Value Ref Range Status   Specimen Description BLOOD LEFT ANTECUBITAL  Final   Special Requests BOTTLES DRAWN AEROBIC AND ANAEROBIC 10CC  Final   Culture  Setup Time   Final    GRAM POSITIVE COCCI IN CLUSTERS CRITICAL RESULT CALLED TO, READ BACK BY AND VERIFIED WITH: TO SWHITE(RN) BY TCLEVELAND 01/21/16 AT 1:08AM IN BOTH AEROBIC AND ANAEROBIC BOTTLES    Culture STAPHYLOCOCCUS AUREUS  Final   Report Status 01/23/2016 FINAL  Final   Organism ID, Bacteria STAPHYLOCOCCUS AUREUS  Final      Susceptibility   Staphylococcus aureus - MIC*    CIPROFLOXACIN <=0.5 SENSITIVE Sensitive     ERYTHROMYCIN <=0.25 SENSITIVE Sensitive     GENTAMICIN <=0.5 SENSITIVE Sensitive     OXACILLIN <=0.25 SENSITIVE Sensitive     TETRACYCLINE <=1 SENSITIVE Sensitive     VANCOMYCIN <=0.5 SENSITIVE Sensitive      TRIMETH/SULFA <=10 SENSITIVE Sensitive     CLINDAMYCIN <=0.25 SENSITIVE Sensitive     RIFAMPIN <=0.5 SENSITIVE Sensitive     Inducible Clindamycin NEGATIVE Sensitive     * STAPHYLOCOCCUS AUREUS  Culture, blood (Routine X 2) w Reflex to ID Panel     Status: None   Collection Time: 01/20/16  8:57 AM  Result Value Ref Range Status   Specimen Description BLOOD LEFT FOREARM  Final   Special Requests BOTTLES DRAWN AEROBIC ONLY 10CC  Final   Culture  Setup Time   Final    GRAM POSITIVE COCCI IN CLUSTERS CRITICAL RESULT CALLED TO, READ BACK BY AND VERIFIED WITH: TO SWHITEHORN(RN) BY TCLEVELAND 01/21/2016 AT 2:33AM AEROBIC BOTTLE ONLY    Culture   Final    STAPHYLOCOCCUS AUREUS SUSCEPTIBILITIES PERFORMED ON PREVIOUS CULTURE WITHIN THE LAST 5 DAYS.    Report Status 01/23/2016 FINAL  Final  Culture, blood (routine x 2)     Status: None   Collection Time: 01/22/16  9:13 AM  Result Value Ref Range Status   Specimen Description BLOOD RIGHT ANTECUBITAL  Final   Special Requests BOTTLES DRAWN AEROBIC AND ANAEROBIC 5CC  Final   Culture NO GROWTH 5 DAYS  Final   Report Status 01/27/2016 FINAL  Final  Culture, blood (routine x 2)     Status: None   Collection Time: 01/22/16  9:19 AM  Result Value Ref Range Status   Specimen Description BLOOD BLOOD RIGHT FOREARM  Final   Special Requests BOTTLES DRAWN AEROBIC AND ANAEROBIC 5CC  Final   Culture NO GROWTH 5 DAYS  Final   Report Status 01/27/2016 FINAL  Final     Labs: CBC:  Recent Labs Lab 01/23/16 0547 01/24/16 0430 01/25/16 0725 01/26/16 1235 01/27/16 0450  WBC 9.6 9.8 12.4* 14.1* 15.3*  NEUTROABS 6.7  --  9.8* 12.1* 10.7*  HGB 8.3* 9.3* 9.6* 10.3* 11.0*  HCT 24.2* 27.1* 27.5* 28.3* 32.2*  MCV 93.8 93.8 92.6 92.5 92.8  PLT 140* 181 196 248 [id number]   Basic Metabolic Panel:  Recent Labs Lab 01/23/16 0547 01/24/16 0430 01/24/16 1428  01/25/16 0725  01/27/16 0450  01/28/16 0033 01/28/16 0536 01/28/16 1330 01/28/16 1901  01/29/16 0423  NA 129* 127* 128*  < > 129*  < > 126*  < > 127* 129* 127* 133* 131*  K 3.5 3.6 3.8  --  3.6  --  3.5  --   --   --   --   --   --   CL 100* 95* 95*  --  97*  --  94*  --   --   --   --   --   --   CO2 24 25 23   --  25  --  22  --   --   --   --   --   --   GLUCOSE 108* 100* 106*  --  106*  --  119*  --   --   --   --   --   --   BUN 20 14 13   --  13  --  13  --   --   --   --   --   --   CREATININE 1.10 0.92 1.00  --  0.96  --  1.10  --   --   --   --   --   --   CALCIUM 7.5* 7.7* 8.1*  --  7.9*  --  8.3*  --   --   --   --   --   --   MG  --   --   --   --  2.0  --   --   --   --   --   --   --   --   < > = values in this interval not displayed. Liver Function Tests:  Recent Labs Lab 01/25/16 0725 01/27/16 0450  AST 27 51*  ALT 13* 15*  ALKPHOS 75 87  BILITOT 0.7 1.0  PROT 5.3* 6.3*  ALBUMIN 2.5* 3.0*   Time spent: 30 minutes  Signed:  Author: Berle Mull, MD [facility] Pager: [phone] 01/29/2016 4:21 PM

## 2016-01-30 ENCOUNTER — Encounter: Payer: Self-pay | Admitting: *Deleted

## 2016-01-30 ENCOUNTER — Other Ambulatory Visit: Payer: Self-pay | Admitting: *Deleted

## 2016-01-30 DIAGNOSIS — R7881 Bacteremia: Secondary | ICD-10-CM | POA: Diagnosis not present

## 2016-01-30 DIAGNOSIS — N401 Enlarged prostate with lower urinary tract symptoms: Secondary | ICD-10-CM | POA: Diagnosis not present

## 2016-01-30 DIAGNOSIS — I5032 Chronic diastolic (congestive) heart failure: Secondary | ICD-10-CM | POA: Diagnosis not present

## 2016-01-30 DIAGNOSIS — I129 Hypertensive chronic kidney disease with stage 1 through stage 4 chronic kidney disease, or unspecified chronic kidney disease: Secondary | ICD-10-CM | POA: Diagnosis not present

## 2016-01-30 DIAGNOSIS — H3530 Unspecified macular degeneration: Secondary | ICD-10-CM | POA: Diagnosis not present

## 2016-01-30 DIAGNOSIS — R338 Other retention of urine: Secondary | ICD-10-CM | POA: Diagnosis not present

## 2016-01-30 DIAGNOSIS — Z9181 History of falling: Secondary | ICD-10-CM | POA: Diagnosis not present

## 2016-01-30 DIAGNOSIS — Z452 Encounter for adjustment and management of vascular access device: Secondary | ICD-10-CM | POA: Diagnosis not present

## 2016-01-30 DIAGNOSIS — I2510 Atherosclerotic heart disease of native coronary artery without angina pectoris: Secondary | ICD-10-CM | POA: Diagnosis not present

## 2016-01-30 DIAGNOSIS — F329 Major depressive disorder, single episode, unspecified: Secondary | ICD-10-CM | POA: Diagnosis not present

## 2016-01-30 DIAGNOSIS — B9561 Methicillin susceptible Staphylococcus aureus infection as the cause of diseases classified elsewhere: Secondary | ICD-10-CM | POA: Diagnosis not present

## 2016-01-30 DIAGNOSIS — Z792 Long term (current) use of antibiotics: Secondary | ICD-10-CM | POA: Diagnosis not present

## 2016-01-30 DIAGNOSIS — Z7901 Long term (current) use of anticoagulants: Secondary | ICD-10-CM | POA: Diagnosis not present

## 2016-01-30 DIAGNOSIS — I350 Nonrheumatic aortic (valve) stenosis: Secondary | ICD-10-CM | POA: Diagnosis not present

## 2016-01-30 DIAGNOSIS — N183 Chronic kidney disease, stage 3 (moderate): Secondary | ICD-10-CM | POA: Diagnosis not present

## 2016-01-30 NOTE — Patient Outreach (Signed)
Call back from patient daughter Joseph Garner. She requested that case be closed. She feels that with all of the home care services, they do not need The additional [facility] services at this time. She will however keep our packet and information and will contact us in the future if a future need were to arise. Will close out case and referral.  Royetta Crochet. Laymond Purser, RN, BSN, [place] [phone]

## 2016-01-30 NOTE — Patient Outreach (Signed)
Call placed to patient daughter, [name] in regard to Transition of Care program referral. She reports that she is unable to speak at this time and requests call back tomorrow, states they are actually at a medical appointment. Plan to attempt to contact again tomorrow.  Royetta Crochet. Laymond Purser, RN, BSN, [place] [phone]) Business Cell  [phone]) Toll Free Office

## 2016-01-31 DIAGNOSIS — I5032 Chronic diastolic (congestive) heart failure: Secondary | ICD-10-CM | POA: Diagnosis not present

## 2016-01-31 DIAGNOSIS — Z452 Encounter for adjustment and management of vascular access device: Secondary | ICD-10-CM | POA: Diagnosis not present

## 2016-01-31 DIAGNOSIS — B9561 Methicillin susceptible Staphylococcus aureus infection as the cause of diseases classified elsewhere: Secondary | ICD-10-CM | POA: Diagnosis not present

## 2016-01-31 DIAGNOSIS — R338 Other retention of urine: Secondary | ICD-10-CM | POA: Diagnosis not present

## 2016-01-31 DIAGNOSIS — R7881 Bacteremia: Secondary | ICD-10-CM | POA: Diagnosis not present

## 2016-01-31 DIAGNOSIS — N401 Enlarged prostate with lower urinary tract symptoms: Secondary | ICD-10-CM | POA: Diagnosis not present

## 2016-02-01 DIAGNOSIS — M4806 Spinal stenosis, lumbar region: Secondary | ICD-10-CM | POA: Diagnosis not present

## 2016-02-01 DIAGNOSIS — R339 Retention of urine, unspecified: Secondary | ICD-10-CM | POA: Diagnosis not present

## 2016-02-01 DIAGNOSIS — I151 Hypertension secondary to other renal disorders: Secondary | ICD-10-CM | POA: Diagnosis not present

## 2016-02-01 DIAGNOSIS — Z951 Presence of aortocoronary bypass graft: Secondary | ICD-10-CM | POA: Diagnosis not present

## 2016-02-01 DIAGNOSIS — N183 Chronic kidney disease, stage 3 (moderate): Secondary | ICD-10-CM | POA: Diagnosis not present

## 2016-02-01 DIAGNOSIS — N401 Enlarged prostate with lower urinary tract symptoms: Secondary | ICD-10-CM | POA: Diagnosis not present

## 2016-02-01 DIAGNOSIS — I5032 Chronic diastolic (congestive) heart failure: Secondary | ICD-10-CM | POA: Diagnosis not present

## 2016-02-01 DIAGNOSIS — E871 Hypo-osmolality and hyponatremia: Secondary | ICD-10-CM | POA: Diagnosis not present

## 2016-02-01 DIAGNOSIS — I483 Typical atrial flutter: Secondary | ICD-10-CM | POA: Diagnosis not present

## 2016-02-01 DIAGNOSIS — K219 Gastro-esophageal reflux disease without esophagitis: Secondary | ICD-10-CM | POA: Diagnosis not present

## 2016-02-01 DIAGNOSIS — R7881 Bacteremia: Secondary | ICD-10-CM | POA: Diagnosis not present

## 2016-02-01 DIAGNOSIS — Z954 Presence of other heart-valve replacement: Secondary | ICD-10-CM | POA: Diagnosis not present

## 2016-02-02 DIAGNOSIS — I5032 Chronic diastolic (congestive) heart failure: Secondary | ICD-10-CM | POA: Diagnosis not present

## 2016-02-02 DIAGNOSIS — B9561 Methicillin susceptible Staphylococcus aureus infection as the cause of diseases classified elsewhere: Secondary | ICD-10-CM | POA: Diagnosis not present

## 2016-02-02 DIAGNOSIS — R7881 Bacteremia: Secondary | ICD-10-CM | POA: Diagnosis not present

## 2016-02-02 DIAGNOSIS — N401 Enlarged prostate with lower urinary tract symptoms: Secondary | ICD-10-CM | POA: Diagnosis not present

## 2016-02-02 DIAGNOSIS — A4901 Methicillin susceptible Staphylococcus aureus infection, unspecified site: Secondary | ICD-10-CM | POA: Diagnosis not present

## 2016-02-02 DIAGNOSIS — Z452 Encounter for adjustment and management of vascular access device: Secondary | ICD-10-CM | POA: Diagnosis not present

## 2016-02-02 DIAGNOSIS — R338 Other retention of urine: Secondary | ICD-10-CM | POA: Diagnosis not present

## 2016-02-05 DIAGNOSIS — I5032 Chronic diastolic (congestive) heart failure: Secondary | ICD-10-CM | POA: Diagnosis not present

## 2016-02-05 DIAGNOSIS — N401 Enlarged prostate with lower urinary tract symptoms: Secondary | ICD-10-CM | POA: Diagnosis not present

## 2016-02-05 DIAGNOSIS — R7881 Bacteremia: Secondary | ICD-10-CM | POA: Diagnosis not present

## 2016-02-05 DIAGNOSIS — Z452 Encounter for adjustment and management of vascular access device: Secondary | ICD-10-CM | POA: Diagnosis not present

## 2016-02-05 DIAGNOSIS — R338 Other retention of urine: Secondary | ICD-10-CM | POA: Diagnosis not present

## 2016-02-05 DIAGNOSIS — B9561 Methicillin susceptible Staphylococcus aureus infection as the cause of diseases classified elsewhere: Secondary | ICD-10-CM | POA: Diagnosis not present

## 2016-02-05 DIAGNOSIS — A4901 Methicillin susceptible Staphylococcus aureus infection, unspecified site: Secondary | ICD-10-CM | POA: Diagnosis not present

## 2016-02-06 DIAGNOSIS — Z452 Encounter for adjustment and management of vascular access device: Secondary | ICD-10-CM | POA: Diagnosis not present

## 2016-02-06 DIAGNOSIS — R338 Other retention of urine: Secondary | ICD-10-CM | POA: Diagnosis not present

## 2016-02-06 DIAGNOSIS — R7881 Bacteremia: Secondary | ICD-10-CM | POA: Diagnosis not present

## 2016-02-06 DIAGNOSIS — N401 Enlarged prostate with lower urinary tract symptoms: Secondary | ICD-10-CM | POA: Diagnosis not present

## 2016-02-06 DIAGNOSIS — B9561 Methicillin susceptible Staphylococcus aureus infection as the cause of diseases classified elsewhere: Secondary | ICD-10-CM | POA: Diagnosis not present

## 2016-02-06 DIAGNOSIS — I5032 Chronic diastolic (congestive) heart failure: Secondary | ICD-10-CM | POA: Diagnosis not present

## 2016-02-07 DIAGNOSIS — R7881 Bacteremia: Secondary | ICD-10-CM | POA: Diagnosis not present

## 2016-02-07 DIAGNOSIS — B9561 Methicillin susceptible Staphylococcus aureus infection as the cause of diseases classified elsewhere: Secondary | ICD-10-CM | POA: Diagnosis not present

## 2016-02-07 DIAGNOSIS — R338 Other retention of urine: Secondary | ICD-10-CM | POA: Diagnosis not present

## 2016-02-07 DIAGNOSIS — Z452 Encounter for adjustment and management of vascular access device: Secondary | ICD-10-CM | POA: Diagnosis not present

## 2016-02-07 DIAGNOSIS — N401 Enlarged prostate with lower urinary tract symptoms: Secondary | ICD-10-CM | POA: Diagnosis not present

## 2016-02-07 DIAGNOSIS — I5032 Chronic diastolic (congestive) heart failure: Secondary | ICD-10-CM | POA: Diagnosis not present

## 2016-02-09 DIAGNOSIS — R338 Other retention of urine: Secondary | ICD-10-CM | POA: Diagnosis not present

## 2016-02-09 DIAGNOSIS — N401 Enlarged prostate with lower urinary tract symptoms: Secondary | ICD-10-CM | POA: Diagnosis not present

## 2016-02-09 DIAGNOSIS — B9561 Methicillin susceptible Staphylococcus aureus infection as the cause of diseases classified elsewhere: Secondary | ICD-10-CM | POA: Diagnosis not present

## 2016-02-09 DIAGNOSIS — I5032 Chronic diastolic (congestive) heart failure: Secondary | ICD-10-CM | POA: Diagnosis not present

## 2016-02-09 DIAGNOSIS — Z452 Encounter for adjustment and management of vascular access device: Secondary | ICD-10-CM | POA: Diagnosis not present

## 2016-02-09 DIAGNOSIS — R7881 Bacteremia: Secondary | ICD-10-CM | POA: Diagnosis not present

## 2016-02-12 DIAGNOSIS — I151 Hypertension secondary to other renal disorders: Secondary | ICD-10-CM | POA: Diagnosis not present

## 2016-02-12 DIAGNOSIS — R338 Other retention of urine: Secondary | ICD-10-CM | POA: Diagnosis not present

## 2016-02-12 DIAGNOSIS — B9561 Methicillin susceptible Staphylococcus aureus infection as the cause of diseases classified elsewhere: Secondary | ICD-10-CM | POA: Diagnosis not present

## 2016-02-12 DIAGNOSIS — R7881 Bacteremia: Secondary | ICD-10-CM | POA: Diagnosis not present

## 2016-02-12 DIAGNOSIS — N183 Chronic kidney disease, stage 3 (moderate): Secondary | ICD-10-CM | POA: Diagnosis not present

## 2016-02-12 DIAGNOSIS — Z951 Presence of aortocoronary bypass graft: Secondary | ICD-10-CM | POA: Diagnosis not present

## 2016-02-12 DIAGNOSIS — Z452 Encounter for adjustment and management of vascular access device: Secondary | ICD-10-CM | POA: Diagnosis not present

## 2016-02-12 DIAGNOSIS — N401 Enlarged prostate with lower urinary tract symptoms: Secondary | ICD-10-CM | POA: Diagnosis not present

## 2016-02-12 DIAGNOSIS — I5032 Chronic diastolic (congestive) heart failure: Secondary | ICD-10-CM | POA: Diagnosis not present

## 2016-02-13 DIAGNOSIS — I483 Typical atrial flutter: Secondary | ICD-10-CM | POA: Diagnosis not present

## 2016-02-13 DIAGNOSIS — R7881 Bacteremia: Secondary | ICD-10-CM | POA: Diagnosis not present

## 2016-02-13 DIAGNOSIS — R338 Other retention of urine: Secondary | ICD-10-CM | POA: Diagnosis not present

## 2016-02-13 DIAGNOSIS — Z452 Encounter for adjustment and management of vascular access device: Secondary | ICD-10-CM | POA: Diagnosis not present

## 2016-02-13 DIAGNOSIS — I952 Hypotension due to drugs: Secondary | ICD-10-CM | POA: Diagnosis not present

## 2016-02-13 DIAGNOSIS — I5032 Chronic diastolic (congestive) heart failure: Secondary | ICD-10-CM | POA: Diagnosis not present

## 2016-02-13 DIAGNOSIS — M4806 Spinal stenosis, lumbar region: Secondary | ICD-10-CM | POA: Diagnosis not present

## 2016-02-13 DIAGNOSIS — R601 Generalized edema: Secondary | ICD-10-CM | POA: Diagnosis not present

## 2016-02-13 DIAGNOSIS — B9561 Methicillin susceptible Staphylococcus aureus infection as the cause of diseases classified elsewhere: Secondary | ICD-10-CM | POA: Diagnosis not present

## 2016-02-13 DIAGNOSIS — N401 Enlarged prostate with lower urinary tract symptoms: Secondary | ICD-10-CM | POA: Diagnosis not present

## 2016-02-13 DIAGNOSIS — I151 Hypertension secondary to other renal disorders: Secondary | ICD-10-CM | POA: Diagnosis not present

## 2016-02-13 DIAGNOSIS — R252 Cramp and spasm: Secondary | ICD-10-CM | POA: Diagnosis not present

## 2016-02-13 DIAGNOSIS — R339 Retention of urine, unspecified: Secondary | ICD-10-CM | POA: Diagnosis not present

## 2016-02-14 DIAGNOSIS — N401 Enlarged prostate with lower urinary tract symptoms: Secondary | ICD-10-CM | POA: Diagnosis not present

## 2016-02-14 DIAGNOSIS — I5032 Chronic diastolic (congestive) heart failure: Secondary | ICD-10-CM | POA: Diagnosis not present

## 2016-02-14 DIAGNOSIS — Z452 Encounter for adjustment and management of vascular access device: Secondary | ICD-10-CM | POA: Diagnosis not present

## 2016-02-14 DIAGNOSIS — R338 Other retention of urine: Secondary | ICD-10-CM | POA: Diagnosis not present

## 2016-02-14 DIAGNOSIS — R7881 Bacteremia: Secondary | ICD-10-CM | POA: Diagnosis not present

## 2016-02-14 DIAGNOSIS — B9561 Methicillin susceptible Staphylococcus aureus infection as the cause of diseases classified elsewhere: Secondary | ICD-10-CM | POA: Diagnosis not present

## 2016-02-15 DIAGNOSIS — N401 Enlarged prostate with lower urinary tract symptoms: Secondary | ICD-10-CM | POA: Diagnosis not present

## 2016-02-15 DIAGNOSIS — R338 Other retention of urine: Secondary | ICD-10-CM | POA: Diagnosis not present

## 2016-02-15 DIAGNOSIS — B9561 Methicillin susceptible Staphylococcus aureus infection as the cause of diseases classified elsewhere: Secondary | ICD-10-CM | POA: Diagnosis not present

## 2016-02-15 DIAGNOSIS — I5032 Chronic diastolic (congestive) heart failure: Secondary | ICD-10-CM | POA: Diagnosis not present

## 2016-02-15 DIAGNOSIS — Z452 Encounter for adjustment and management of vascular access device: Secondary | ICD-10-CM | POA: Diagnosis not present

## 2016-02-15 DIAGNOSIS — R7881 Bacteremia: Secondary | ICD-10-CM | POA: Diagnosis not present

## 2016-02-16 DIAGNOSIS — I5032 Chronic diastolic (congestive) heart failure: Secondary | ICD-10-CM | POA: Diagnosis not present

## 2016-02-16 DIAGNOSIS — R338 Other retention of urine: Secondary | ICD-10-CM | POA: Diagnosis not present

## 2016-02-16 DIAGNOSIS — N401 Enlarged prostate with lower urinary tract symptoms: Secondary | ICD-10-CM | POA: Diagnosis not present

## 2016-02-16 DIAGNOSIS — Z452 Encounter for adjustment and management of vascular access device: Secondary | ICD-10-CM | POA: Diagnosis not present

## 2016-02-16 DIAGNOSIS — R7881 Bacteremia: Secondary | ICD-10-CM | POA: Diagnosis not present

## 2016-02-16 DIAGNOSIS — B9561 Methicillin susceptible Staphylococcus aureus infection as the cause of diseases classified elsewhere: Secondary | ICD-10-CM | POA: Diagnosis not present

## 2016-02-19 DIAGNOSIS — R7881 Bacteremia: Secondary | ICD-10-CM | POA: Diagnosis not present

## 2016-02-19 DIAGNOSIS — I5032 Chronic diastolic (congestive) heart failure: Secondary | ICD-10-CM | POA: Diagnosis not present

## 2016-02-19 DIAGNOSIS — R7981 Abnormal blood-gas level: Secondary | ICD-10-CM | POA: Diagnosis not present

## 2016-02-19 DIAGNOSIS — B9561 Methicillin susceptible Staphylococcus aureus infection as the cause of diseases classified elsewhere: Secondary | ICD-10-CM | POA: Diagnosis not present

## 2016-02-19 DIAGNOSIS — Z452 Encounter for adjustment and management of vascular access device: Secondary | ICD-10-CM | POA: Diagnosis not present

## 2016-02-19 DIAGNOSIS — I2510 Atherosclerotic heart disease of native coronary artery without angina pectoris: Secondary | ICD-10-CM | POA: Diagnosis not present

## 2016-02-19 DIAGNOSIS — N401 Enlarged prostate with lower urinary tract symptoms: Secondary | ICD-10-CM | POA: Diagnosis not present

## 2016-02-19 DIAGNOSIS — R338 Other retention of urine: Secondary | ICD-10-CM | POA: Diagnosis not present

## 2016-02-19 DIAGNOSIS — I129 Hypertensive chronic kidney disease with stage 1 through stage 4 chronic kidney disease, or unspecified chronic kidney disease: Secondary | ICD-10-CM | POA: Diagnosis not present

## 2016-02-19 DIAGNOSIS — N183 Chronic kidney disease, stage 3 (moderate): Secondary | ICD-10-CM | POA: Diagnosis not present

## 2016-02-23 DIAGNOSIS — R7881 Bacteremia: Secondary | ICD-10-CM | POA: Diagnosis not present

## 2016-02-23 DIAGNOSIS — I5032 Chronic diastolic (congestive) heart failure: Secondary | ICD-10-CM | POA: Diagnosis not present

## 2016-02-23 DIAGNOSIS — Z452 Encounter for adjustment and management of vascular access device: Secondary | ICD-10-CM | POA: Diagnosis not present

## 2016-02-23 DIAGNOSIS — R338 Other retention of urine: Secondary | ICD-10-CM | POA: Diagnosis not present

## 2016-02-23 DIAGNOSIS — N401 Enlarged prostate with lower urinary tract symptoms: Secondary | ICD-10-CM | POA: Diagnosis not present

## 2016-02-23 DIAGNOSIS — B9561 Methicillin susceptible Staphylococcus aureus infection as the cause of diseases classified elsewhere: Secondary | ICD-10-CM | POA: Diagnosis not present

## 2016-02-26 DIAGNOSIS — N401 Enlarged prostate with lower urinary tract symptoms: Secondary | ICD-10-CM | POA: Diagnosis not present

## 2016-02-26 DIAGNOSIS — I5032 Chronic diastolic (congestive) heart failure: Secondary | ICD-10-CM | POA: Diagnosis not present

## 2016-02-26 DIAGNOSIS — B9561 Methicillin susceptible Staphylococcus aureus infection as the cause of diseases classified elsewhere: Secondary | ICD-10-CM | POA: Diagnosis not present

## 2016-02-26 DIAGNOSIS — Z452 Encounter for adjustment and management of vascular access device: Secondary | ICD-10-CM | POA: Diagnosis not present

## 2016-02-26 DIAGNOSIS — R7881 Bacteremia: Secondary | ICD-10-CM | POA: Diagnosis not present

## 2016-02-26 DIAGNOSIS — R338 Other retention of urine: Secondary | ICD-10-CM | POA: Diagnosis not present

## 2016-02-27 DIAGNOSIS — I483 Typical atrial flutter: Secondary | ICD-10-CM | POA: Diagnosis not present

## 2016-02-27 DIAGNOSIS — N401 Enlarged prostate with lower urinary tract symptoms: Secondary | ICD-10-CM | POA: Diagnosis not present

## 2016-02-27 DIAGNOSIS — B9561 Methicillin susceptible Staphylococcus aureus infection as the cause of diseases classified elsewhere: Secondary | ICD-10-CM | POA: Diagnosis not present

## 2016-02-27 DIAGNOSIS — R339 Retention of urine, unspecified: Secondary | ICD-10-CM | POA: Diagnosis not present

## 2016-02-27 DIAGNOSIS — R252 Cramp and spasm: Secondary | ICD-10-CM | POA: Diagnosis not present

## 2016-02-27 DIAGNOSIS — R29898 Other symptoms and signs involving the musculoskeletal system: Secondary | ICD-10-CM | POA: Diagnosis not present

## 2016-02-27 DIAGNOSIS — Z452 Encounter for adjustment and management of vascular access device: Secondary | ICD-10-CM | POA: Diagnosis not present

## 2016-02-27 DIAGNOSIS — D892 Hypergammaglobulinemia, unspecified: Secondary | ICD-10-CM | POA: Diagnosis not present

## 2016-02-27 DIAGNOSIS — R601 Generalized edema: Secondary | ICD-10-CM | POA: Diagnosis not present

## 2016-02-27 DIAGNOSIS — I5032 Chronic diastolic (congestive) heart failure: Secondary | ICD-10-CM | POA: Diagnosis not present

## 2016-02-27 DIAGNOSIS — R338 Other retention of urine: Secondary | ICD-10-CM | POA: Diagnosis not present

## 2016-02-27 DIAGNOSIS — Z952 Presence of prosthetic heart valve: Secondary | ICD-10-CM | POA: Diagnosis not present

## 2016-02-27 DIAGNOSIS — E8809 Other disorders of plasma-protein metabolism, not elsewhere classified: Secondary | ICD-10-CM | POA: Diagnosis not present

## 2016-02-27 DIAGNOSIS — R7881 Bacteremia: Secondary | ICD-10-CM | POA: Diagnosis not present

## 2016-02-28 DIAGNOSIS — R7881 Bacteremia: Secondary | ICD-10-CM | POA: Diagnosis not present

## 2016-02-28 DIAGNOSIS — N401 Enlarged prostate with lower urinary tract symptoms: Secondary | ICD-10-CM | POA: Diagnosis not present

## 2016-02-28 DIAGNOSIS — Z452 Encounter for adjustment and management of vascular access device: Secondary | ICD-10-CM | POA: Diagnosis not present

## 2016-02-28 DIAGNOSIS — B9561 Methicillin susceptible Staphylococcus aureus infection as the cause of diseases classified elsewhere: Secondary | ICD-10-CM | POA: Diagnosis not present

## 2016-02-28 DIAGNOSIS — R338 Other retention of urine: Secondary | ICD-10-CM | POA: Diagnosis not present

## 2016-02-28 DIAGNOSIS — I5032 Chronic diastolic (congestive) heart failure: Secondary | ICD-10-CM | POA: Diagnosis not present

## 2016-02-29 DIAGNOSIS — N401 Enlarged prostate with lower urinary tract symptoms: Secondary | ICD-10-CM | POA: Diagnosis not present

## 2016-02-29 DIAGNOSIS — B9561 Methicillin susceptible Staphylococcus aureus infection as the cause of diseases classified elsewhere: Secondary | ICD-10-CM | POA: Diagnosis not present

## 2016-02-29 DIAGNOSIS — R338 Other retention of urine: Secondary | ICD-10-CM | POA: Diagnosis not present

## 2016-02-29 DIAGNOSIS — I5032 Chronic diastolic (congestive) heart failure: Secondary | ICD-10-CM | POA: Diagnosis not present

## 2016-02-29 DIAGNOSIS — R7881 Bacteremia: Secondary | ICD-10-CM | POA: Diagnosis not present

## 2016-02-29 DIAGNOSIS — Z452 Encounter for adjustment and management of vascular access device: Secondary | ICD-10-CM | POA: Diagnosis not present

## 2016-03-01 ENCOUNTER — Telehealth: Payer: Self-pay | Admitting: *Deleted

## 2016-03-01 ENCOUNTER — Other Ambulatory Visit: Payer: Self-pay | Admitting: *Deleted

## 2016-03-01 DIAGNOSIS — N401 Enlarged prostate with lower urinary tract symptoms: Secondary | ICD-10-CM | POA: Diagnosis not present

## 2016-03-01 DIAGNOSIS — Z452 Encounter for adjustment and management of vascular access device: Secondary | ICD-10-CM | POA: Diagnosis not present

## 2016-03-01 DIAGNOSIS — R338 Other retention of urine: Secondary | ICD-10-CM | POA: Diagnosis not present

## 2016-03-01 DIAGNOSIS — I5032 Chronic diastolic (congestive) heart failure: Secondary | ICD-10-CM | POA: Diagnosis not present

## 2016-03-01 DIAGNOSIS — B9561 Methicillin susceptible Staphylococcus aureus infection as the cause of diseases classified elsewhere: Secondary | ICD-10-CM | POA: Diagnosis not present

## 2016-03-01 DIAGNOSIS — R7881 Bacteremia: Secondary | ICD-10-CM | POA: Diagnosis not present

## 2016-03-01 MED ORDER — FUROSEMIDE 40 MG PO TABS: 20.0000 mg | ORAL_TABLET | ORAL | Status: DC | PRN

## 2016-03-01 NOTE — Telephone Encounter (Signed)
Okay to refill? 

## 2016-03-01 NOTE — Telephone Encounter (Signed)
Pharmacist from [employer] in [place] called and stated that the patients caregiver is requesting an rx for furosemide for the patient. Caregiver was not sure of patients current dose. Our office has not sent in an rx for this in a while and per the pharmacist, the last time that it was refilled was June of 2016. Please advise. Thanks, MI

## 2016-03-01 NOTE — Telephone Encounter (Deleted)
Will forward to MD to review. Primary cardiologist is Dr. Marlou Porch. Ok to refill as currently prescribed in the patient's chart?

## 2016-03-01 NOTE — Telephone Encounter (Signed)
Addendum created

## 2016-03-04 DIAGNOSIS — R338 Other retention of urine: Secondary | ICD-10-CM | POA: Diagnosis not present

## 2016-03-04 DIAGNOSIS — I5032 Chronic diastolic (congestive) heart failure: Secondary | ICD-10-CM | POA: Diagnosis not present

## 2016-03-04 DIAGNOSIS — R7881 Bacteremia: Secondary | ICD-10-CM | POA: Diagnosis not present

## 2016-03-04 DIAGNOSIS — Z452 Encounter for adjustment and management of vascular access device: Secondary | ICD-10-CM | POA: Diagnosis not present

## 2016-03-04 DIAGNOSIS — N401 Enlarged prostate with lower urinary tract symptoms: Secondary | ICD-10-CM | POA: Diagnosis not present

## 2016-03-04 DIAGNOSIS — B9561 Methicillin susceptible Staphylococcus aureus infection as the cause of diseases classified elsewhere: Secondary | ICD-10-CM | POA: Diagnosis not present

## 2016-03-05 DIAGNOSIS — B9561 Methicillin susceptible Staphylococcus aureus infection as the cause of diseases classified elsewhere: Secondary | ICD-10-CM | POA: Diagnosis not present

## 2016-03-05 DIAGNOSIS — R7881 Bacteremia: Secondary | ICD-10-CM | POA: Diagnosis not present

## 2016-03-05 DIAGNOSIS — I5032 Chronic diastolic (congestive) heart failure: Secondary | ICD-10-CM | POA: Diagnosis not present

## 2016-03-05 DIAGNOSIS — N401 Enlarged prostate with lower urinary tract symptoms: Secondary | ICD-10-CM | POA: Diagnosis not present

## 2016-03-05 DIAGNOSIS — R338 Other retention of urine: Secondary | ICD-10-CM | POA: Diagnosis not present

## 2016-03-05 DIAGNOSIS — Z452 Encounter for adjustment and management of vascular access device: Secondary | ICD-10-CM | POA: Diagnosis not present

## 2016-03-07 DIAGNOSIS — N401 Enlarged prostate with lower urinary tract symptoms: Secondary | ICD-10-CM | POA: Diagnosis not present

## 2016-03-07 DIAGNOSIS — I5032 Chronic diastolic (congestive) heart failure: Secondary | ICD-10-CM | POA: Diagnosis not present

## 2016-03-07 DIAGNOSIS — R7881 Bacteremia: Secondary | ICD-10-CM | POA: Diagnosis not present

## 2016-03-07 DIAGNOSIS — B9561 Methicillin susceptible Staphylococcus aureus infection as the cause of diseases classified elsewhere: Secondary | ICD-10-CM | POA: Diagnosis not present

## 2016-03-07 DIAGNOSIS — R338 Other retention of urine: Secondary | ICD-10-CM | POA: Diagnosis not present

## 2016-03-07 DIAGNOSIS — Z452 Encounter for adjustment and management of vascular access device: Secondary | ICD-10-CM | POA: Diagnosis not present

## 2016-03-13 DIAGNOSIS — M6281 Muscle weakness (generalized): Secondary | ICD-10-CM | POA: Diagnosis not present

## 2016-03-13 DIAGNOSIS — B9561 Methicillin susceptible Staphylococcus aureus infection as the cause of diseases classified elsewhere: Secondary | ICD-10-CM | POA: Diagnosis not present

## 2016-03-13 DIAGNOSIS — R531 Weakness: Secondary | ICD-10-CM | POA: Diagnosis not present

## 2016-03-13 DIAGNOSIS — N401 Enlarged prostate with lower urinary tract symptoms: Secondary | ICD-10-CM | POA: Diagnosis not present

## 2016-03-13 DIAGNOSIS — R7881 Bacteremia: Secondary | ICD-10-CM | POA: Diagnosis not present

## 2016-03-13 DIAGNOSIS — Z452 Encounter for adjustment and management of vascular access device: Secondary | ICD-10-CM | POA: Diagnosis not present

## 2016-03-13 DIAGNOSIS — R338 Other retention of urine: Secondary | ICD-10-CM | POA: Diagnosis not present

## 2016-03-13 DIAGNOSIS — I5032 Chronic diastolic (congestive) heart failure: Secondary | ICD-10-CM | POA: Diagnosis not present

## 2016-03-14 ENCOUNTER — Telehealth: Payer: Self-pay | Admitting: Cardiology

## 2016-03-14 NOTE — Telephone Encounter (Signed)
Agree with plan as stated above.   Candee Furbish, MD

## 2016-03-14 NOTE — Telephone Encounter (Signed)
Pt's daughter is aware that Dr. Marlou Porch agrees with pt's PCP's plan. Daughter [name] verbalized understanding.

## 2016-03-14 NOTE — Telephone Encounter (Signed)
Spoke with pt's Daughter [name], regarding pt . According to [name] pt was in [facility] for several week with legs and back severe cramps. Pt got weaker while in the hospital that when he was D/C he was in a W/C and the doctors never   Found  the cause of his severe cramps.  Pt is now in [place] with his daughter. Daughter states that pt has a PCP in [facility], Dr. Luanne Bras which is specialized in Kidney disease.  Since this past weekend pt has been having LE swelling. Daughter states Dr. Marlou Porch had recommended for pt to take furosemide 40 mg to take 1/2 of tablet (20 mg) as needed. This past Monday his PCP recommended for pt to take lasix 20 mg twice a day. Pt's LE (feet and ankles) continue to be swollen and his left leg swelling has gone up higher to his calf. Daughter had called his PCP today and he increased the Furosemide to 40 mg twice a day, and to use pressure stockings. Daughter [name] would like to know if   Dr Marlou Porch agreed with those recommendations.

## 2016-03-14 NOTE — Telephone Encounter (Signed)
New message      Pt c/o swelling: STAT is pt has developed SOB within 24 hours  1. How long have you been experiencing swelling?since the weekend 2. Where is the swelling located?  Ankle/feet both legs and all of left leg up to groin 3.  Are you currently taking a "fluid pill"?yes, furosemide  40mg  1/2 pill bid 4.  Are you currently SOB? no 5.  Have you traveled recently? Pt traveled to [place] 6 weeks ago [facility] and has been with daughter in [place] since discharge.  He is under the care of a Ruby Cola, MD while in [place].  He is a kidney doctor.  Pt has other health issues but daughter is more concerned with with recent swelling.  Pt is trying to walk daily to regain strength but is afraid swelling will hinder his progress.  Please advise

## 2016-03-15 DIAGNOSIS — R531 Weakness: Secondary | ICD-10-CM | POA: Diagnosis not present

## 2016-03-15 DIAGNOSIS — M6281 Muscle weakness (generalized): Secondary | ICD-10-CM | POA: Diagnosis not present

## 2016-03-20 DIAGNOSIS — R252 Cramp and spasm: Secondary | ICD-10-CM | POA: Diagnosis not present

## 2016-03-20 DIAGNOSIS — D649 Anemia, unspecified: Secondary | ICD-10-CM | POA: Diagnosis not present

## 2016-03-20 DIAGNOSIS — R601 Generalized edema: Secondary | ICD-10-CM | POA: Diagnosis not present

## 2016-03-20 DIAGNOSIS — E871 Hypo-osmolality and hyponatremia: Secondary | ICD-10-CM | POA: Diagnosis not present

## 2016-03-20 DIAGNOSIS — K5904 Chronic idiopathic constipation: Secondary | ICD-10-CM | POA: Diagnosis not present

## 2016-03-20 DIAGNOSIS — F329 Major depressive disorder, single episode, unspecified: Secondary | ICD-10-CM | POA: Diagnosis not present

## 2016-03-20 DIAGNOSIS — I151 Hypertension secondary to other renal disorders: Secondary | ICD-10-CM | POA: Diagnosis not present

## 2016-03-20 DIAGNOSIS — Z79899 Other long term (current) drug therapy: Secondary | ICD-10-CM | POA: Diagnosis not present

## 2016-03-20 DIAGNOSIS — R339 Retention of urine, unspecified: Secondary | ICD-10-CM | POA: Diagnosis not present

## 2016-03-20 DIAGNOSIS — K219 Gastro-esophageal reflux disease without esophagitis: Secondary | ICD-10-CM | POA: Diagnosis not present

## 2016-03-20 DIAGNOSIS — E876 Hypokalemia: Secondary | ICD-10-CM | POA: Diagnosis not present

## 2016-03-20 DIAGNOSIS — N401 Enlarged prostate with lower urinary tract symptoms: Secondary | ICD-10-CM | POA: Diagnosis not present

## 2016-03-21 DIAGNOSIS — M6281 Muscle weakness (generalized): Secondary | ICD-10-CM | POA: Diagnosis not present

## 2016-03-21 DIAGNOSIS — R531 Weakness: Secondary | ICD-10-CM | POA: Diagnosis not present

## 2016-03-26 DIAGNOSIS — R339 Retention of urine, unspecified: Secondary | ICD-10-CM | POA: Diagnosis not present

## 2016-03-27 DIAGNOSIS — R531 Weakness: Secondary | ICD-10-CM | POA: Diagnosis not present

## 2016-03-27 DIAGNOSIS — M6281 Muscle weakness (generalized): Secondary | ICD-10-CM | POA: Diagnosis not present

## 2016-03-28 DIAGNOSIS — R339 Retention of urine, unspecified: Secondary | ICD-10-CM | POA: Diagnosis not present

## 2016-03-28 DIAGNOSIS — K4090 Unilateral inguinal hernia, without obstruction or gangrene, not specified as recurrent: Secondary | ICD-10-CM | POA: Diagnosis not present

## 2016-04-01 DIAGNOSIS — T83038A Leakage of other indwelling urethral catheter, initial encounter: Secondary | ICD-10-CM | POA: Diagnosis not present

## 2016-04-01 DIAGNOSIS — N401 Enlarged prostate with lower urinary tract symptoms: Secondary | ICD-10-CM | POA: Diagnosis not present

## 2016-04-02 DIAGNOSIS — M6281 Muscle weakness (generalized): Secondary | ICD-10-CM | POA: Diagnosis not present

## 2016-04-02 DIAGNOSIS — R5381 Other malaise: Secondary | ICD-10-CM | POA: Diagnosis not present

## 2016-04-03 DIAGNOSIS — K5903 Drug induced constipation: Secondary | ICD-10-CM | POA: Diagnosis not present

## 2016-04-03 DIAGNOSIS — R339 Retention of urine, unspecified: Secondary | ICD-10-CM | POA: Diagnosis not present

## 2016-04-03 DIAGNOSIS — R601 Generalized edema: Secondary | ICD-10-CM | POA: Diagnosis not present

## 2016-04-03 DIAGNOSIS — N401 Enlarged prostate with lower urinary tract symptoms: Secondary | ICD-10-CM | POA: Diagnosis not present

## 2016-04-05 DIAGNOSIS — M6281 Muscle weakness (generalized): Secondary | ICD-10-CM | POA: Diagnosis not present

## 2016-04-05 DIAGNOSIS — R5381 Other malaise: Secondary | ICD-10-CM | POA: Diagnosis not present

## 2016-04-09 DIAGNOSIS — M6281 Muscle weakness (generalized): Secondary | ICD-10-CM | POA: Diagnosis not present

## 2016-04-09 DIAGNOSIS — R5381 Other malaise: Secondary | ICD-10-CM | POA: Diagnosis not present

## 2016-04-11 DIAGNOSIS — M6281 Muscle weakness (generalized): Secondary | ICD-10-CM | POA: Diagnosis not present

## 2016-04-11 DIAGNOSIS — R5381 Other malaise: Secondary | ICD-10-CM | POA: Diagnosis not present

## 2016-04-11 DIAGNOSIS — R339 Retention of urine, unspecified: Secondary | ICD-10-CM | POA: Diagnosis not present

## 2016-04-13 DIAGNOSIS — R140 Abdominal distension (gaseous): Secondary | ICD-10-CM | POA: Diagnosis not present

## 2016-04-13 DIAGNOSIS — R339 Retention of urine, unspecified: Secondary | ICD-10-CM | POA: Diagnosis not present

## 2016-04-16 DIAGNOSIS — M6281 Muscle weakness (generalized): Secondary | ICD-10-CM | POA: Diagnosis not present

## 2016-04-16 DIAGNOSIS — R5381 Other malaise: Secondary | ICD-10-CM | POA: Diagnosis not present

## 2016-04-17 DIAGNOSIS — M6281 Muscle weakness (generalized): Secondary | ICD-10-CM | POA: Diagnosis not present

## 2016-04-17 DIAGNOSIS — R5381 Other malaise: Secondary | ICD-10-CM | POA: Diagnosis not present

## 2016-04-18 DIAGNOSIS — R2689 Other abnormalities of gait and mobility: Secondary | ICD-10-CM | POA: Diagnosis not present

## 2016-04-18 DIAGNOSIS — M25512 Pain in left shoulder: Secondary | ICD-10-CM | POA: Diagnosis not present

## 2016-04-18 DIAGNOSIS — M545 Low back pain: Secondary | ICD-10-CM | POA: Diagnosis not present

## 2016-04-18 DIAGNOSIS — M6281 Muscle weakness (generalized): Secondary | ICD-10-CM | POA: Diagnosis not present

## 2016-04-18 NOTE — Telephone Encounter (Signed)
Call from referring MD, Jacelyn Griparl Brandspigel, re: complicated pt hx. Pt with refractory retention and failed several voiding trails. [place] resides in [place] and daughter in [place].     Dr Raynelle DickBrandspigel to get Urine Cx from cath. Will try to do UDS and BPH screen in the same visit.    Sidni to help arrange.    Lurlean NannyGregg R Yosiah Jasmin, MD

## 2016-04-24 DIAGNOSIS — Z951 Presence of aortocoronary bypass graft: Secondary | ICD-10-CM | POA: Diagnosis not present

## 2016-04-24 DIAGNOSIS — Z954 Presence of other heart-valve replacement: Secondary | ICD-10-CM | POA: Diagnosis not present

## 2016-04-24 DIAGNOSIS — R339 Retention of urine, unspecified: Secondary | ICD-10-CM | POA: Diagnosis not present

## 2016-04-24 DIAGNOSIS — N401 Enlarged prostate with lower urinary tract symptoms: Secondary | ICD-10-CM | POA: Diagnosis not present

## 2016-04-24 DIAGNOSIS — Z952 Presence of prosthetic heart valve: Secondary | ICD-10-CM | POA: Diagnosis not present

## 2016-04-24 DIAGNOSIS — K219 Gastro-esophageal reflux disease without esophagitis: Secondary | ICD-10-CM | POA: Diagnosis not present

## 2016-04-24 DIAGNOSIS — I483 Typical atrial flutter: Secondary | ICD-10-CM | POA: Diagnosis not present

## 2016-04-24 DIAGNOSIS — K4090 Unilateral inguinal hernia, without obstruction or gangrene, not specified as recurrent: Secondary | ICD-10-CM | POA: Diagnosis not present

## 2016-04-24 DIAGNOSIS — R601 Generalized edema: Secondary | ICD-10-CM | POA: Diagnosis not present

## 2016-04-26 ENCOUNTER — Encounter

## 2016-04-26 NOTE — Telephone Encounter (Signed)
I called both numbers on file to schedule pt for the UDS and BPH Screen. The UDS schedule is full on 05/01/16 but they are holding a slot on 05/03/16. I called to see if we could move appt to that date to encompass all testing as requested by the provider. I left my direct contact info so family can call back directly.

## 2016-05-01 ENCOUNTER — Encounter: Attending: Urology | Primary: Family Medicine

## 2016-05-02 DIAGNOSIS — M545 Low back pain: Secondary | ICD-10-CM | POA: Diagnosis not present

## 2016-05-02 DIAGNOSIS — R2689 Other abnormalities of gait and mobility: Secondary | ICD-10-CM | POA: Diagnosis not present

## 2016-05-02 DIAGNOSIS — M6281 Muscle weakness (generalized): Secondary | ICD-10-CM | POA: Diagnosis not present

## 2016-05-02 DIAGNOSIS — M25512 Pain in left shoulder: Secondary | ICD-10-CM | POA: Diagnosis not present

## 2016-05-03 ENCOUNTER — Ambulatory Visit: Admit: 2016-05-03 | Discharge: 2016-05-03 | Payer: MEDICARE | Attending: Urology | Primary: Family Medicine

## 2016-05-03 ENCOUNTER — Ambulatory Visit: Admit: 2016-05-03 | Discharge: 2016-05-03 | Payer: MEDICARE | Primary: Family Medicine

## 2016-05-03 DIAGNOSIS — R339 Retention of urine, unspecified: Secondary | ICD-10-CM

## 2016-05-03 DIAGNOSIS — N401 Enlarged prostate with lower urinary tract symptoms: Secondary | ICD-10-CM | POA: Diagnosis not present

## 2016-05-03 NOTE — Progress Notes (Signed)
CYSTOSCOPY PROCEDURE    Patient Name: Matthew FrostJohn J Grandpre            Date of Procedure: 05/03/2016     Procedure: Cystoscopy    The patient was placed in the supine position, and prepped and draped in the normal fashion. 5 ml of 4% Lidocaine gel was placed in the urethra. Once adequate anesthesia was achieved; the flexible cystoscope was placed into the bladder.    Consent:  All risks, benefits and options were reviewed in detail and the patient agrees to procedure. Risks include but are not limited to bleeding, infection, sepsis, death, dysuria and others    History / Imaging:       Since last visit Matthew Chandler reports urinary retention       Exam:    Lidocaine Jelly: Yes            Scope: Flexible Scope  Meatus: Normal  Urethra: Normal  Prostate: obstructing:  Trilobar with small median lobe   Bladder neck: Elevated    Trigone: Normal  Trabeculation:3+  Diverticuli: No  Lesion: none    Lab / Imaging:   No results found for any visits on 05/03/16.     Impression:  Obstructing prostate; trilobar with small median lobe.       Plan:    See Office Visit note    Antibiotic provided: Cipro    Lurlean NannyGregg R. Chantele Corado, MD    Medical documentation provided with the assistance of [facility] Power, medical scribe for Lurlean NannyGregg R Deepti Gunawan, MD on 05/03/2016

## 2016-05-03 NOTE — Progress Notes (Signed)
BPH EVALUATION       IMP:    ICD-10-CM ICD-9-CM    1. Urinary retention R33.9 788.20 CYSTOURETHROSCOPY      AMB POC US, TRANSRECTAL   2. Benign nodular prostatic hyperplasia with lower urinary tract symptoms N40.1 600.10        All treatment options were discussed at length.  We discussed watchful waiting, medical therapy, TUMT, other minimally invasive therapies, clinical trials, TURP, GreenLight Laser-PVP (Photoselective Vaporization of the Prostate), and UroLift. The risks and complications were reviewed.  Total visit time was 45 minutes of which more than 50% of the time was spent in  coordination and counseling of care.         PLAN:  ?? BPH screen today. See separate office notes.   ?? VT today   ?? Discussed CIC with pt and daughter. Defers for now.   ?? Continue Flomax 0.4mg  and Proscar 5mg   ?? Discussed options for BPH: continued medication vs. Surgical intervention. Discussed UroLift and PVP. Literature provided on both.   ?? Sidni will contact and tentatively schedule either procedure          History: Matthew Chandler is a 82 y.o. male with a history of progressive BPH symptoms who presents today for a BPH screen.     Pt has been in refractory retention for the past several months. He is staying with [place] in [place]. Previously resided in [place]. Currently on Flomax and Proscar. Previously on Vesicare and Oxybutynin but discontinued 3 weeks ago. He has failed approx VT x3. Long discussion with patient and daughter re: repeat VT today vs beginning self cath. Discussed pros and cons for both Urolift vs PVP. Daughter will find out if he can discontinue Eliquis for surgery. UDS today (outlined below); reviewed with patient and daughter. Pt denies dysuria and gross hematuria.     S/p heart valve 3 years ago     UDS today (05/03/2016)   Micturition  Voided Volume: 74 ml   Detrusor Pressure: At peak flow rate: 60 cm H2O Maximum: 66 cm H2O   Duration Contraction: 3 minutes continuous     Post Void Contraction: no    Valsalva: no   External Sphincter: slightly active   Max Flow Rate: 2.2 ml/sec   Average Flow Rate 1.3 ml/sec   Voiding Time: 1:27 minutes   Flow Pattern: intermittent   Post Void Residual: 200 ml - catheter   Comments: Cipro 500 mg given prior to the test per UD protocol. Per CMG he has a small capacity compliant bladder with delayed sensations. He had DO near capacity without leakage. He voided with a prolonged high pressure detrusor contraction, low intermittent flow, slightly active EUS and incomplete emptying. Patient was prepped in the usual manner and a # 16 Fr. foley catheter was inserted without difficulty with 200 ml obtained. His pressure-flow parameters are in the obstructive range for obstruction.           AUA Assessment Score:  In refractory retention                  Flow rate:  Peak:  2.2 ml/sec            Volume:  74 ml    Bladder Scan:  200  ml    PSA:     No results found for: PSA, Sherle PoeSA2, PSAR1, PSA1, PSAR2, PSA3, PSAR3, [id number], [id number], PSALT           Cysto:  BOO,  trilobar with small median lobe     TRUS:  Vol 88 gms     We discussed all treatment options including observation, medical therapy, minimally invasive treatments, clinical trial options, Urolift and laser therapy with GreenLight PVP.    No Known Allergies  Past Medical History:   Diagnosis Date   ??? Anemia, unspecified    ??? Aortic valve replaced    ??? Bacteremia 12/2015    MSSA   ??? Benign prostatic hyperplasia with lower urinary tract symptoms    ??? Chronic diastolic heart failure (HCC)    ??? Chronic idiopathic constipation    ??? Generalized edema    ??? GERD without esophagitis    ??? History of aortic valve replacement with bioprosthetic valve    ??? History of depression    ??? 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 Hypokalemia    ??? Hyponatremia with excess extracellular fluid volume    ??? Long-term use of high-risk medication    ??? Muscle cramps    ??? Muscular deconditioning     ??? Reactive depression    ??? Spinal stenosis of lumbar region    ??? Typical atrial flutter (HCC)    ??? Urinary retention      Past Surgical History:   Procedure Laterality Date   ??? HX APPENDECTOMY     ??? HX CATARACT REMOVAL     ??? HX CORONARY ARTERY BYPASS GRAFT  05/2013   ??? HX OTHER SURGICAL      NECK SURGERY   ??? HX TONSILLECTOMY         Review of Systems  Constitutional: Fever: No  Skin: Rash: No  HEENT: Hearing difficulty: No  Eyes: Blurred vision: No  Cardiovascular: Chest pain: No  Respiratory: Shortness of breath: No  Gastrointestinal: Nausea/vomiting: No  Musculoskeletal: Back pain: No  Neurological: Weakness: No  Psychological: Memory loss: No  Comments/additional findings:                          PE:  Visit Vitals   ??? BP 160/80   ??? Ht  (1.702 m)   ??? Wt 151 lb (68.5 kg)   ??? BMI 23.65 kg/m2      Constitutional: WDWN, Pleasant and appropriate affect, No acute distress.  CV:  No peripheral swelling noted  Resp: No SOB, no breathing difficulty  Abdomen:  No abdominal masses or tenderness., No hepatospleenomegaly., No CVA tenderness. and No hernias noted.  GU Male:    DRE: BPH screen today. See separate office notes.   Skin: Normal color and texture and No rashes or erythema noted  Lymphatic: No grossly palpable groin adenopathy  Neuro/Psych:  Alert and Oriented x3, affect appropriate.    Lab:  Results for orders placed or performed in visit on 05/03/16   AMB POC [place], TRANSRECTAL    Impression    88 grams.           Lurlean Nanny, MD     Medical documentation provided with the assistance of [facility] Power, medical scribe for Lurlean Nanny, MD on 05/06/2016

## 2016-05-03 NOTE — Progress Notes (Addendum)
[facility]  Without Video- Fluoroscopy    Name: Matthew Chandler   Date: 05/03/2016      Matthew Chandler is a 82 y.o. male who presents today for evaluation. Patient has been referred by Dr. Despina Hidden .  Dr. Louretta Parma is available in clinic as the incident to provider.        ICD-10-CM ICD-9-CM    1. Urinary retention R33.9 788.20 PR ELECTRO-UROFLOWMETRY, FIRST      PR ANAL/URINARY MUSCLE STUDY      PR VOIDING PRESS STUDY INTRA-ABDOMINAL VOID      PR COMPLEX CYSTOMETROGRAM VOIDING PRESSURE STUDIES     Orders Placed This Encounter   ??? ([id number]) - PR ELECTRO-UROFLOWMETRY, FIRST   ??? ([id number]) - PR ANAL/URINARY MUSCLE STUDY   ??? ([id number]) - PR VOIDING PRESS STUDY INTRA-ABDOMINAL VOID   ??? ([id number]) - COMPLEX CYSTOMETROGRAM VOIDING PRESSURE STUDIES       Past Medical History:   Diagnosis Date   ??? Anemia, unspecified    ??? Aortic valve replaced    ??? Bacteremia 12/2015    MSSA   ??? Benign prostatic hyperplasia with lower urinary tract symptoms    ??? Chronic diastolic heart failure (HCC)    ??? Chronic idiopathic constipation    ??? Generalized edema    ??? GERD without esophagitis    ??? History of aortic valve replacement with bioprosthetic valve    ??? History of depression    ??? 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 Hypokalemia    ??? Hyponatremia with excess extracellular fluid volume    ??? Long-term use of high-risk medication    ??? Muscle cramps    ??? Muscular deconditioning    ??? Reactive depression    ??? Spinal stenosis of lumbar region    ??? Typical atrial flutter (HCC)    ??? Urinary retention       Past Surgical History:   Procedure Laterality Date   ??? HX APPENDECTOMY     ??? HX CATARACT REMOVAL     ??? HX CORONARY ARTERY BYPASS GRAFT  05/2013   ??? HX OTHER SURGICAL      NECK SURGERY   ??? HX TONSILLECTOMY         Urine dipstick shows not performed: Unable to produce specimen.    Voiding Diary: has indwelling catheter           Upon arrival, the patient's  # 16 Fr foley straight catheter was gently  removed after the balloon was deflated.  The genital and perineal areas were cleansed with benzalkonium chloride and patient was catheterized with a #7 fr Coude  urodynamics catheter was inserted into the bladder to infuse fluid and assess bladder activity and voiding parameters.  A #9 fr catheter was inserted into the rectum to assess straining during voiding. Both catheters were connected to water filled lines to transducers that simultaneously measure pressure generated inside and outside the bladder during filling and voiding as well as resulting flow volume and time.      Three patch electrodes are placed, one ground and two perianally which are used to measure the electrical activity of the external urinary sphincter during filling and emptying the bladder.      Infusion with sterile water was started and the patient was asked to voice sensations of pressure, urge and bladder fullness, followed by instructions to void.      No results found for this or any previous visit.  Orders Placed This Encounter   ??? ([id number]) - PR ELECTRO-UROFLOWMETRY, FIRST   ??? ([id number]) - PR ANAL/URINARY MUSCLE STUDY   ??? ([id number]) - PR VOIDING PRESS STUDY INTRA-ABDOMINAL VOID   ??? ([id number]) - COMPLEX CYSTOMETROGRAM VOIDING PRESSURE STUDIES         Cystometrogram  Sensation delayed   First sensation 191 ml   Strong desire 205 ml   Capacity 208 ml   Compliance: normal     Detrusor overactivity:yes Near capacity           Micturition  Voided Volume: 74 ml   Detrusor Pressure: At peak flow rate: 60 cm H2O Maximum: 66 cm H2O   Duration Contraction: 3 minutes continuous    Post Void Contraction: no    Valsalva: no   External Sphincter: slightly active   Max Flow Rate: 2.2 ml/sec   Average Flow Rate 1.3 ml/sec   Voiding Time: 1:27 minutes   Flow Pattern: intermittent   Post Void Residual: 200 ml - catheter   Comments: Cipro 500 mg given prior to the test per UD protocol. Per CMG he  has a small capacity compliant bladder with delayed sensations. He had DO near capacity without leakage. He voided with a prolonged moderately high pressure detrusor contraction, low intermittent flow, slightly active EUS and incomplete emptying. Patient was prepped in the usual manner and a # 16 Fr. foley catheter was inserted without difficulty with 200 ml obtained. His pressure-flow parameters are in the obstructive range for obstruction.       The Patient Instruction Sheet was reviewed with and given to patient who expressed understanding.      Kindell Strada A Estreya Clay,RN    Follow-up Disposition: Not on File     ADDENDUM:  I have reviewed the above study and tracings and agree with above.     -Etheleen NicksPETER O KWONG, MD

## 2016-05-03 NOTE — Progress Notes (Signed)
TRUS VOLUME      PREOP DIAGNOSIS: BPH    POSTOP DIAG:  BPH    PROCEDURE: Ultrasound Scan of the Prostate     DESCRIPTION OF PROCEDURE:  The patient is placed in the left lateral decubitus position. The Ultrasound Probe is passed to the ano-rectal canal.  Representative photos of the ultrasound scan are scanned.  No lesions were noted. The volume of the prostate is 88 grams.      The patient tolerated the procedure well and was discharged in good condition.      Lurlean NannyGregg R. Izear Pine, MD    Medical documentation provided with the assistance of [facility] Power, medical scribe for Lurlean NannyGregg R Milt Coye, MD on 05/03/2016

## 2016-05-03 NOTE — Telephone Encounter (Signed)
pts daughter called Nedra HaiLee ( HIPAA Verified) stated they have not decide yet on PVP or the Urolift, they will have an answer for Monday when she calls back. However Nedra HaiLee did state that she called his cardiologist Quentin MullingGrey Taylor phone number [phone]. And they stated they need our office to fax them or call them to ask about him coming off his Eliquis for 7 days prior to either procedure. Fax number is [phone].//[email or web address]

## 2016-05-04 DIAGNOSIS — N401 Enlarged prostate with lower urinary tract symptoms: Secondary | ICD-10-CM | POA: Diagnosis not present

## 2016-05-04 DIAGNOSIS — R339 Retention of urine, unspecified: Secondary | ICD-10-CM | POA: Diagnosis not present

## 2016-05-04 DIAGNOSIS — R338 Other retention of urine: Secondary | ICD-10-CM | POA: Diagnosis not present

## 2016-05-06 ENCOUNTER — Telehealth: Payer: Self-pay | Admitting: Cardiology

## 2016-05-06 NOTE — Telephone Encounter (Signed)
Left message to call back to discuss.

## 2016-05-06 NOTE — Telephone Encounter (Signed)
Will forward to Dr Skains for review and recommendations 

## 2016-05-06 NOTE — Telephone Encounter (Signed)
Spoke with daughter who is aware of Dr Marlou Porch recommendations and states understanding.  She is also aware pt is due for a f/u appt in September and she will c/b to schedule this.

## 2016-05-06 NOTE — Telephone Encounter (Signed)
New Message  Pt daughter call requesting to speak with RN about cath that was placed, but is unable to keep. Pt daughter states pt is to have urolift or green light laser procedure. Daughter states pt can choose which procedure, but needs to be off Eliquis for 7 days prior to procedure. Pt daughter states she wants to get an okay from the doctor that this procedure is okay to do. Pt daughter also states she would like to get pt seen by Friday. Please call back to discuss

## 2016-05-06 NOTE — Telephone Encounter (Signed)
He may proceed with urologic procedure with moderate cardiac risk based mainly on his advanced age. Our recommendation is to hold Eliquis 3 days prior to procedure (should be more than adequate) and to resume when urology sees fit.  Candee Furbish, MD

## 2016-05-07 ENCOUNTER — Encounter

## 2016-05-08 DIAGNOSIS — I151 Hypertension secondary to other renal disorders: Secondary | ICD-10-CM | POA: Diagnosis not present

## 2016-05-08 DIAGNOSIS — N401 Enlarged prostate with lower urinary tract symptoms: Secondary | ICD-10-CM | POA: Diagnosis not present

## 2016-05-08 DIAGNOSIS — Z01810 Encounter for preprocedural cardiovascular examination: Secondary | ICD-10-CM | POA: Diagnosis not present

## 2016-05-09 DIAGNOSIS — M6281 Muscle weakness (generalized): Secondary | ICD-10-CM | POA: Diagnosis not present

## 2016-05-09 DIAGNOSIS — R2689 Other abnormalities of gait and mobility: Secondary | ICD-10-CM | POA: Diagnosis not present

## 2016-05-09 DIAGNOSIS — M25512 Pain in left shoulder: Secondary | ICD-10-CM | POA: Diagnosis not present

## 2016-05-09 DIAGNOSIS — M545 Low back pain: Secondary | ICD-10-CM | POA: Diagnosis not present

## 2016-05-09 NOTE — Telephone Encounter (Signed)
Surgery has been scheduled for the Urol[facility] for [facility]/14/16 at [facility]

## 2016-05-14 DIAGNOSIS — I2510 Atherosclerotic heart disease of native coronary artery without angina pectoris: Secondary | ICD-10-CM | POA: Diagnosis not present

## 2016-05-14 DIAGNOSIS — N041 Nephrotic syndrome with focal and segmental glomerular lesions: Secondary | ICD-10-CM | POA: Diagnosis not present

## 2016-05-14 DIAGNOSIS — Z7901 Long term (current) use of anticoagulants: Secondary | ICD-10-CM | POA: Diagnosis not present

## 2016-05-14 DIAGNOSIS — N401 Enlarged prostate with lower urinary tract symptoms: Secondary | ICD-10-CM | POA: Diagnosis not present

## 2016-05-14 DIAGNOSIS — R338 Other retention of urine: Secondary | ICD-10-CM | POA: Diagnosis not present

## 2016-05-14 DIAGNOSIS — Z953 Presence of xenogenic heart valve: Secondary | ICD-10-CM | POA: Diagnosis not present

## 2016-05-14 DIAGNOSIS — I493 Ventricular premature depolarization: Secondary | ICD-10-CM | POA: Diagnosis not present

## 2016-05-14 DIAGNOSIS — Z87891 Personal history of nicotine dependence: Secondary | ICD-10-CM | POA: Diagnosis not present

## 2016-05-14 DIAGNOSIS — Z79899 Other long term (current) drug therapy: Secondary | ICD-10-CM | POA: Diagnosis not present

## 2016-05-14 DIAGNOSIS — Z87440 Personal history of urinary (tract) infections: Secondary | ICD-10-CM | POA: Diagnosis not present

## 2016-05-14 DIAGNOSIS — I4892 Unspecified atrial flutter: Secondary | ICD-10-CM | POA: Diagnosis not present

## 2016-05-14 DIAGNOSIS — I5032 Chronic diastolic (congestive) heart failure: Secondary | ICD-10-CM | POA: Diagnosis not present

## 2016-05-14 DIAGNOSIS — K219 Gastro-esophageal reflux disease without esophagitis: Secondary | ICD-10-CM | POA: Diagnosis not present

## 2016-05-14 DIAGNOSIS — R339 Retention of urine, unspecified: Secondary | ICD-10-CM | POA: Diagnosis not present

## 2016-05-14 DIAGNOSIS — I4581 Long QT syndrome: Secondary | ICD-10-CM | POA: Diagnosis not present

## 2016-05-14 DIAGNOSIS — I110 Hypertensive heart disease with heart failure: Secondary | ICD-10-CM | POA: Diagnosis not present

## 2016-05-15 DIAGNOSIS — I5032 Chronic diastolic (congestive) heart failure: Secondary | ICD-10-CM | POA: Diagnosis not present

## 2016-05-15 DIAGNOSIS — Z87440 Personal history of urinary (tract) infections: Secondary | ICD-10-CM | POA: Diagnosis not present

## 2016-05-15 DIAGNOSIS — I2510 Atherosclerotic heart disease of native coronary artery without angina pectoris: Secondary | ICD-10-CM | POA: Diagnosis not present

## 2016-05-15 DIAGNOSIS — R339 Retention of urine, unspecified: Secondary | ICD-10-CM | POA: Diagnosis not present

## 2016-05-15 DIAGNOSIS — N401 Enlarged prostate with lower urinary tract symptoms: Secondary | ICD-10-CM | POA: Diagnosis not present

## 2016-05-15 DIAGNOSIS — I110 Hypertensive heart disease with heart failure: Secondary | ICD-10-CM | POA: Diagnosis not present

## 2016-05-15 NOTE — Telephone Encounter (Addendum)
Patient daughter called and want Dr.Eure to be aware of the situation that took place today.     Patient has not been able to void all day. The patient daughter is with the patient. She stated the on call P.A was paged several times with no response. I also spoke to Urugu[place]harlotte the charge nurse [phone]( [phone]) and she stated they have not been able to get in touch with Delaine Lameanya Fuhrman. They have paged her several times since this morning with no response. The patient is now in pain. I spoke with Esau GrewBarbara Naiper and she got in touch with Tanya. I called the patient daughter back to check and make sure the patient was taken care of and she was very upset. She stated Tanya ordered for a catheter to be placed but did not come to see the patient. It has been hours and she feel as thought her father did not get the proper care and has been suffering all day. She wanted me to make you aware of the situation. I told her I would defiantly send you a message and make you aware. She thanked me and the call was ended.

## 2016-05-17 ENCOUNTER — Ambulatory Visit: Admit: 2016-05-17 | Discharge: 2016-05-17 | Payer: MEDICARE | Attending: Urology | Primary: Family Medicine

## 2016-05-17 DIAGNOSIS — R339 Retention of urine, unspecified: Secondary | ICD-10-CM

## 2016-05-17 DIAGNOSIS — R310 Gross hematuria: Secondary | ICD-10-CM | POA: Diagnosis not present

## 2016-05-17 DIAGNOSIS — N401 Enlarged prostate with lower urinary tract symptoms: Secondary | ICD-10-CM | POA: Diagnosis not present

## 2016-05-17 NOTE — Progress Notes (Signed)
Patient's daughter called and left a message to advise that they will go ahead and do the VT at [facility] and let us know how he does on Weds 05/22/16 in the am.  Daughter Smitty CordsLee Lidy (hippa verified) left phone number in case [phone].

## 2016-05-17 NOTE — Progress Notes (Signed)
ASSESSMENT:     ICD-10-CM ICD-9-CM    1. Urinary retention R33.9 788.20    2. Gross hematuria R31.0 599.71    3. Benign non-nodular prostatic hyperplasia with lower urinary tract symptoms N40.1 600.91               PLAN:    ?? New gross hematuria with clots due to traumatic catheter placement just prior to seeing us  ?? Cathter noted to be in prostatic urethra - on antibiotics already  ?? Attempted several manual irrigations and not successful  ?? Placed 24 fr 3-way foley and irrigated all clots out  ?? CBI set up in the office - urine cleared after 20 minutes of irrigation and no further clots - no active bleeding and very comfortable  ?? Will keep 24 Fr 3 way catheter in place and re-evaluate this on Wednesday for possible voiding trial  ?? Continue on Flomax and Proscar  ?? Patient was very comfortable and wanted to go home on completion  ?? No pain and urine essentially clear  ?? Has family he stays with and was driven to office with friend  ?? Reviewed reasons to go to ER and concerns  ?? States on antibiotics from PCP           Chief Complaint   Patient presents with   ??? Hematuria   ??? Urinary Retention       HISTORY OF PRESENT ILLNESS:  Matthew Chandler is a 82 y.o. male who presents today for new gross hematuria and urinary retention. He has been in refractory retention for the past several months. [place] Currently on Flomax and Proscar. He has a foley in place.  He noticed blood in his urine yesterday afternoon.  The hematuria worsened overnight.  He went to his PCP office to have the foley irrigated but when the foley was not draining, they attempted to remove the foley and replace.  the foley balloon was blown up into the prostate and a new foley was not able to be placed.  He presented to the office without a foley and >200 ml of urine in his bladder.  His urine was bright red and thick.  Several attempts to  manually irrigated the foley occurred with the nursing staff, Dr. Letitia Neriosenfeld, and me.  He has not been taking the Eliquis for the last 3 days.  He presents today without his daughter who is typically with him for the appointments.          Past Medical History:   Diagnosis Date   ??? Anemia, unspecified    ??? Aortic valve replaced    ??? Bacteremia 12/2015    MSSA   ??? Benign prostatic hyperplasia with lower urinary tract symptoms    ??? Chronic diastolic heart failure (HCC)    ??? Chronic idiopathic constipation    ??? Generalized edema    ??? GERD without esophagitis    ??? History of aortic valve replacement with bioprosthetic valve    ??? History of depression    ??? 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 Hypokalemia    ??? Hyponatremia with excess extracellular fluid volume    ??? Long-term use of high-risk medication    ??? Muscle cramps    ??? Muscular deconditioning    ??? Reactive depression    ??? Spinal stenosis of lumbar region    ??? Typical atrial flutter (HCC)    ???  Urinary retention        Past Surgical History:   Procedure Laterality Date   ??? HX APPENDECTOMY     ??? HX CATARACT REMOVAL     ??? HX CORONARY ARTERY BYPASS GRAFT  05/2013   ??? HX OTHER SURGICAL      NECK SURGERY   ??? HX TONSILLECTOMY         Social History   Substance Use Topics   ??? Smoking status: Never Smoker   ??? Smokeless tobacco: Never Used   ??? Alcohol use Not on file       No Known Allergies    No family history on file.    Current Outpatient Prescriptions   Medication Sig Dispense Refill   ??? citalopram (CELEXA) 10 mg tablet TK 1 T PO QD IN THE MORNING  11   ??? finasteride (PROSCAR) 5 mg tablet TK 1 T PO QD  3   ??? folic acid (FOLVITE) 1 mg tablet TK 1 T PO QD  3   ??? furosemide (LASIX) 40 mg tablet TAKE ONE-HALF TABLET PO PRF FLUID OR EDEMA  9   ??? potassium chloride (K-DUR, KLOR-CON) 20 mEq tablet TK 2 TS PO QD  0   ??? tamsulosin (FLOMAX) 0.4 mg capsule TK 1 C PO QD  1   ??? traMADol (ULTRAM) 50 mg tablet Take 50 mg by mouth every six (6) hours  as needed for Pain.     ??? omeprazole (PRILOSEC) 20 mg capsule Take 20 mg by mouth daily.     ??? hydrocortisone (CORTIZONE) 0.5 % ointment Apply  to affected area two (2) times a day. use thin layer     ??? polyethylene glycol (MIRALAX) 17 gram packet Take 17 g by mouth daily.     ??? senna-docusate (SENOKOT-S) 8.6-50 mg per tablet Take 1 Tab by mouth daily.     ??? fluticasone (FLONASE) 50 mcg/actuation nasal spray 2 Sprays by Both Nostrils route daily.     ??? fluticasone (FLOVENT DISKUS) 50 mcg/actuation inhaler Take  by inhalation.     ??? loratadine (CLARITIN) 10 mg tablet Take 10 mg by mouth.     ??? multivitamin (ONE A DAY) tablet Take 1 Tab by mouth daily.     ??? VIT A/VIT C/VIT E/ZINC/COPPER (PRESERVISION AREDS PO) Take  by mouth.     ??? HYDROcodone-acetaminophen (NORCO) 5-325 mg per tablet Take  by mouth.     ??? magnesium oxide (MAG-OX) 400 mg tablet Take 400 mg by mouth daily.     ??? ferrous sulfate 325 mg (65 mg iron) cpER Take  by mouth.     ??? tiZANidine (ZANAFLEX) 2 mg capsule Take 2 mg by mouth three (3) times daily.     ??? benzonatate (TESSALON) 100 mg capsule Take 100 mg by mouth three (3) times daily as needed for Cough.     ??? bisacodyl (DULCOLAX, BISACODYL,) 10 mg suppository Insert 10 mg into rectum daily.           Review of Systems  Constitutional: Fever:   Skin: Rash:   HEENT: Hearing difficulty:   Eyes: Blurred vision:   Cardiovascular: Chest pain:   Respiratory: Shortness of breath:   Gastrointestinal: Nausea/vomiting:   Musculoskeletal: Back pain:   Neurological: Weakness:   Psychological: Memory loss:   Comments/additional findings:       There were no vitals taken for this visit.  Constitutional: WDWN, Pleasant and appropriate affect, No acute distress.    CV:  No peripheral swelling noted  Respiratory: No respiratory distress or difficulties  GU Male:    SCROTUM:  No scrotal rash or lesions noticed.  Normal bilateral testes and epididymis.    PENIS: Urethral meatus normal in location and size. No urethral discharge.  Foley in place draining thick red urine.  Skin: No jaundice.    Neuro/Psych:  Alert and oriented x 3. Affect appropriate.            REVIEW OF LABS AND IMAGING:      No results found for any visits on 05/17/16.        A copy of today's office visit with all pertinent imaging results and labs were sent to the referring physician.        Lynnae Sandhoff, PA-C    I have independently seen and examined the patient. I agree with the history, physical, assessment and plan as documented above. Additionally, I have addended the history, physical, assessment and plan as needed in order to reflect my additional findings.    Laverda Page, MD    37 minutes was spent reviewing the current situation with the patient in a face-to-face discussion.  Greater than 50% of the time was spent in consultation.

## 2016-05-17 NOTE — Progress Notes (Signed)
Patient's daughter called the office back and states that patient is having some leakage around catheter. Patient will be taken to PCP for evaluation and just wanted to make Dr. Despina HiddenEure and staff aware. Catheter may need to be irrigated or changed. Daughter then asked if catheter is changed today should patient have VT on Wednesday. I explained that Dr. Despina HiddenEure is not in office, but I will consult with Clementeen Grahamurry, PA-C and return call. Patient's daughter agreed to plan, call ended.  Lexine BatonKinshata Mikinzie Maciejewski, [facility] HospitalNRCMA

## 2016-05-21 NOTE — Progress Notes (Signed)
Pt on Bactrim.

## 2016-05-21 NOTE — Telephone Encounter (Signed)
Spoke with pt daughter (HIPAA verified) to see how patient was doing and check if on the right abx from recent urine culture. She confirmed pt on Bactrim from hospital which current bacteria is sensitive to. She confirmed nurse appt tomorrow and doctor appt next week. Call was ended.

## 2016-05-22 ENCOUNTER — Institutional Professional Consult (permissible substitution): Admit: 2016-05-22 | Discharge: 2016-05-22 | Payer: MEDICARE | Primary: Family Medicine

## 2016-05-22 DIAGNOSIS — N401 Enlarged prostate with lower urinary tract symptoms: Secondary | ICD-10-CM

## 2016-05-22 DIAGNOSIS — R339 Retention of urine, unspecified: Secondary | ICD-10-CM | POA: Diagnosis not present

## 2016-05-22 LAB — AMB POC PVR, MEAS,POST-VOID RES,US,NON-IMAGING: PVR: 120 cc

## 2016-05-22 NOTE — Progress Notes (Signed)
Matthew Chandler is a 82 y.o. male who is here today for cath removal per Dr.Rosenfeld order.   Dr. Louretta Parma was available in the clinic as incident to provider.     There were no vitals taken for this visit.     Patient is advised that there will be a slight burning during removal of the catheter. Privacy was provided.       Patient was placed in supine position.     Procedure:    Under clean technique, the balloon was emptied by inserting the barrel of the syringe and withdrawing the amount of fluid used during inflation. The catheter was gently grasped and removed without difficulty. The area was examined and no skin breakdown was noted.   I have visualized the urine and recorded it as being clear     Patient instructed to call office back at 1 pm to let us know how He  is doing/voiding.  I spent approximately 15 minutes with the patient today explaining the importance of notifying our office if He is unable to void. Patient also knows to report any symptoms of fever, chills, urinary frequency, burning, dribbling, hesitation in starting the stream of urine as well as cloudiness or any other unusual color or characteristic of the urine to our office.  Patient is made fully aware that if they are unable to void that they will need to return to our office prior to the close of business today or to the ER if they are unable to void after hours.  Patient verbalized understanding of the above.  Questions were answered.      Patient called back later and stated that He is voiding but very small amounts.         Weston Anna, Certified Medical Assistant     Carola Frost presents today for PVR per K.Curry's  order.  Patient's identity has been verified.   Dr. Louretta Parma was available in the clinic as incident to provider.     Patient states that He has emptied their bladder to the best of their ability.     PVR today is 126.       Results for orders placed or performed in visit on 05/22/16    AMB POC PVR, MEAS,POST-VOID RES,US,NON-IMAGING   Result Value Ref Range    PVR 120 cc       Orders Placed This Encounter   ??? AMB POC PVR, MEAS,POST-VOID RES,US,NON-IMAGING       * Pt advised to continue drinking plenty of water If unable to urinate over night should go to ED **      Weston Anna, Certified Medical Assistant      -- Reviewed by me and agree.    Etheleen Nicks, MD

## 2016-05-23 ENCOUNTER — Institutional Professional Consult (permissible substitution): Admit: 2016-05-23 | Discharge: 2016-05-23 | Payer: MEDICARE | Primary: Family Medicine

## 2016-05-23 DIAGNOSIS — R339 Retention of urine, unspecified: Secondary | ICD-10-CM

## 2016-05-23 LAB — AMB POC PVR, MEAS,POST-VOID RES,US,NON-IMAGING: PVR: 999 cc

## 2016-05-23 NOTE — Progress Notes (Signed)
Matthew Chandler presents today for PVR per Dr. Garlon Hatchet order.  Patient's identity has been verified.   Dr. Garlon Hatchet was available in the clinic as incident to provider.     PVR today is 999.       Results for orders placed or performed in visit on 05/23/16   AMB POC PVR, MEAS,POST-VOID RES,US,NON-IMAGING   Result Value Ref Range    PVR 999 cc       Orders Placed This Encounter   ??? AMB POC PVR, MEAS,POST-VOID RES,US,NON-IMAGING         Weston Anna, Certified Medical Assistant         Matthew Chandler is a 82 y.o. male is here for a catheter change per Dr. Garlon Hatchet order.    Dr. Garlon Hatchet was present in the clinic as incident to.     There were no vitals taken for this visit.     Procedure:  The genital and perineal areas were cleansed with antiseptic solution.  With clean gloves, I applied sterile lubricant liberally to the catheter tip, lubricating at least six inches of the catheter. Antiseptic solution was used on the cotton balls.  The entire glans was cleansed again.   The penis was held at 90-degree angle. The catheter was advanced into the meatus.  The sphincter was allowed to relax. The penis was lowered and the catheter was advanced.  Balloon was inflated using 10cc of sterile water.   Catheter was connected to drainage system.    Follow up appointment was made.      Catheter size:  9F  Type:  Straight-tip   Material:  Latex    Urine was:   Yellow & Clear    Specimen was obtained:   No  Drainage bag:   YES    Orders Placed This Encounter   ??? Foley Insert /Change Simple   ??? AMB POC PVR, MEAS,POST-VOID RES,US,NON-IMAGING   ??? [id number] - PR BEDSIDE DRAINAGE BAG       Weston Anna, Certified Medical Assistant     I have reviewed the above and agree with treatment plan/course of action as documented:  Lina Sar, MD  05/27/2016

## 2016-05-24 ENCOUNTER — Institutional Professional Consult (permissible substitution): Admit: 2016-05-24 | Discharge: 2016-05-24 | Payer: MEDICARE | Primary: Family Medicine

## 2016-05-24 ENCOUNTER — Encounter: Primary: Family Medicine

## 2016-05-24 DIAGNOSIS — R339 Retention of urine, unspecified: Secondary | ICD-10-CM

## 2016-05-24 NOTE — Progress Notes (Signed)
Patient returned to office today stating that he thinks the catheter "came out too far". I visualized catheter draining properly into catheter drainage bag. Catheter did not seem to be dislodged at all. For patient's convenience, catheter was irrigated.    Carola Frost presents today for bladder irrigation per the orders of Dr. Despina Hidden.  Dr. Allena Earing was available in the clinic and was the incident to provider for the day.       There were no vitals taken for this visit.     I have explained the procedure to the patient.     Patient is placed in the supine position.    Under clean conditions, using 67ml of normal saline, the bladder has been irrigated by flushing in and drawing back to evacuate any clot or debris. Resistance was not encountered. 60 fluid was obtained.    Volume infused:  60   Volume returned: 60   Evacuated NO clots  Patient tolerated the procedure well.    Patient will follow up with Dr. Despina Hidden on 05/30/2016.   Questions were answered.     Catheter size:  51fr  Type:  Straight-tip   Material:  Latex   Irrigation Tray      No orders of the defined types were placed in this encounter.      Lexine Baton, [facility]    I have read and agree with the documentation and plan as outlined above. I was present and available in clinic.    Gertie Fey, MD  Assistant Professor Urology  [facility]  Office [phone]  Fax [phone]

## 2016-05-24 NOTE — Telephone Encounter (Signed)
Patient called back to include a second number to get in touch with [facility]. # [phone] In order to get orders to adjust patients catheter    Thank you,  S

## 2016-05-24 NOTE — Telephone Encounter (Signed)
[facility] today if possible [phone] please let them know asap [phone]

## 2016-05-28 DIAGNOSIS — R2689 Other abnormalities of gait and mobility: Secondary | ICD-10-CM | POA: Diagnosis not present

## 2016-05-28 DIAGNOSIS — M6281 Muscle weakness (generalized): Secondary | ICD-10-CM | POA: Diagnosis not present

## 2016-05-28 DIAGNOSIS — M25512 Pain in left shoulder: Secondary | ICD-10-CM | POA: Diagnosis not present

## 2016-05-28 DIAGNOSIS — M545 Low back pain: Secondary | ICD-10-CM | POA: Diagnosis not present

## 2016-05-28 NOTE — Addendum Note (Signed)
Addended by: Lexine Baton on: 05/28/2016 08:31 AM      Modules accepted: Orders

## 2016-05-30 ENCOUNTER — Ambulatory Visit: Admit: 2016-05-30 | Discharge: 2016-05-30 | Payer: MEDICARE | Attending: Urology | Primary: Family Medicine

## 2016-05-30 DIAGNOSIS — N401 Enlarged prostate with lower urinary tract symptoms: Secondary | ICD-10-CM

## 2016-05-30 DIAGNOSIS — H6123 Impacted cerumen, bilateral: Secondary | ICD-10-CM | POA: Diagnosis not present

## 2016-05-30 DIAGNOSIS — R339 Retention of urine, unspecified: Secondary | ICD-10-CM | POA: Diagnosis not present

## 2016-05-30 MED ORDER — TAMSULOSIN SR 0.4 MG 24 HR CAP
0.4 mg | ORAL_CAPSULE | Freq: Every day | ORAL | 3 refills | Status: DC
Start: 2016-05-30 — End: 2020-01-13

## 2016-05-30 NOTE — Progress Notes (Signed)
Post OP UroLift    IMP: S/P UroLift, Doing well    Plan:    ?? Continue Flomax 0.4mg . Rx re-ordered  ?? Stay off Oxybutynin  ?? HH VT on Monday.  ?? Pt encouraged symptoms are moving in the right direction. Pt still very early in post-operative period.    Follow-up Disposition:  Return in about 3 weeks (around 06/20/2016) for PVR.      Hx: Matthew Chandler is a 82 y.o. male presents 2 weeks s/p UroLift 05/14/16. Pt presents today in urinary retention with foley in place. Since that time, he has been through a lot. Pt has failed two VT since his procedure and issues with leaking around the catheter and hematuria. Today, he is tolerating catheter fine. This cath was last replaced on 05/24/16. He feels like he does have the urge. He states it is not every time, but often. Pt states that at times he feels like he can control the flow. He denies any dysuria or gross hematuria. Pt overall is very comfortable. Pt states that during the voiding trials, he has been able to void a few times but then he was start dribbling and progressively moving towards not being able to go at all.     Pt has had issues with UTI. Treated successfully with Bactrim.     Pt previously lived in [place], [place]. Now lives with his daughter in [place]. Going to be moving back to [place] to [employer]. Will see Dr. Retta Diones.       Pre Op:  Work up included AUA score in the severe range with refractory retention. UDS confirmed obstruction with some bladder function TRUS Vol: ~80 gms    Review of Systems  Constitutional: Fever: No  Skin: Rash: No  HEENT: Hearing difficulty: No  Eyes: Blurred vision: No  Cardiovascular: Chest pain: No  Respiratory: Shortness of breath: No  Gastrointestinal: Nausea/vomiting: No  Musculoskeletal: Back pain: No  Neurological: Weakness: No  Psychological: Memory loss: No  Comments/additional findings:       LABS:   Pt in retention.         Lurlean Nanny, MD     Documentation provided by Claudette Head, medical scribe for Lurlean Nanny, MD

## 2016-05-30 NOTE — Progress Notes (Addendum)
Per the orders or Dr. Despina Hidden, home health nurse is to perform voiding trial on Jun 03, 2016 in the morning. Patient will increase fluids and if unable to urinate or post void risidual is greater than 300cc, replace catheter using 73fr straight tip foley catheter. Patient will follow up with Dr. Despina Hidden on 06/19/2016 10:10 AM.  Lexine Baton, [facility]

## 2016-05-31 NOTE — Telephone Encounter (Signed)
Faxed to number provided.

## 2016-06-03 DIAGNOSIS — Z954 Presence of other heart-valve replacement: Secondary | ICD-10-CM | POA: Diagnosis not present

## 2016-06-03 DIAGNOSIS — Z960 Presence of urogenital implants: Secondary | ICD-10-CM | POA: Diagnosis not present

## 2016-06-03 DIAGNOSIS — R338 Other retention of urine: Secondary | ICD-10-CM | POA: Diagnosis not present

## 2016-06-03 DIAGNOSIS — I110 Hypertensive heart disease with heart failure: Secondary | ICD-10-CM | POA: Diagnosis not present

## 2016-06-03 DIAGNOSIS — F329 Major depressive disorder, single episode, unspecified: Secondary | ICD-10-CM | POA: Diagnosis not present

## 2016-06-03 DIAGNOSIS — Z87440 Personal history of urinary (tract) infections: Secondary | ICD-10-CM | POA: Diagnosis not present

## 2016-06-03 DIAGNOSIS — Z48816 Encounter for surgical aftercare following surgery on the genitourinary system: Secondary | ICD-10-CM | POA: Diagnosis not present

## 2016-06-03 DIAGNOSIS — D649 Anemia, unspecified: Secondary | ICD-10-CM | POA: Diagnosis not present

## 2016-06-03 DIAGNOSIS — N401 Enlarged prostate with lower urinary tract symptoms: Secondary | ICD-10-CM | POA: Diagnosis not present

## 2016-06-03 DIAGNOSIS — I5032 Chronic diastolic (congestive) heart failure: Secondary | ICD-10-CM | POA: Diagnosis not present

## 2016-06-03 DIAGNOSIS — Z7901 Long term (current) use of anticoagulants: Secondary | ICD-10-CM | POA: Diagnosis not present

## 2016-06-03 DIAGNOSIS — Z466 Encounter for fitting and adjustment of urinary device: Secondary | ICD-10-CM | POA: Diagnosis not present

## 2016-06-03 NOTE — Telephone Encounter (Signed)
Diane from [facility] called and stated that pt had voiding trial today and has voided 5 times between 100 and 50 cc each time. They want to verify whether or not to keep the cath out. Per [facility] pt can keep cath out just continue to push fluids.

## 2016-06-05 ENCOUNTER — Encounter: Attending: Urology | Primary: Family Medicine

## 2016-06-06 DIAGNOSIS — R338 Other retention of urine: Secondary | ICD-10-CM | POA: Diagnosis not present

## 2016-06-06 DIAGNOSIS — Z960 Presence of urogenital implants: Secondary | ICD-10-CM | POA: Diagnosis not present

## 2016-06-06 DIAGNOSIS — I5032 Chronic diastolic (congestive) heart failure: Secondary | ICD-10-CM | POA: Diagnosis not present

## 2016-06-06 DIAGNOSIS — Z48816 Encounter for surgical aftercare following surgery on the genitourinary system: Secondary | ICD-10-CM | POA: Diagnosis not present

## 2016-06-06 DIAGNOSIS — N401 Enlarged prostate with lower urinary tract symptoms: Secondary | ICD-10-CM | POA: Diagnosis not present

## 2016-06-06 DIAGNOSIS — I110 Hypertensive heart disease with heart failure: Secondary | ICD-10-CM | POA: Diagnosis not present

## 2016-06-07 NOTE — Progress Notes (Signed)
Per Dr. Forestine ChuteEure's request the most recent office notes were sent to Dr. Juliann MuleStephen Dahlsted at (f) [phone] with [facility] in [place], [place].   (p) [phone]

## 2016-06-10 DIAGNOSIS — N401 Enlarged prostate with lower urinary tract symptoms: Secondary | ICD-10-CM | POA: Diagnosis not present

## 2016-06-10 DIAGNOSIS — Z960 Presence of urogenital implants: Secondary | ICD-10-CM | POA: Diagnosis not present

## 2016-06-10 DIAGNOSIS — R338 Other retention of urine: Secondary | ICD-10-CM | POA: Diagnosis not present

## 2016-06-10 DIAGNOSIS — I110 Hypertensive heart disease with heart failure: Secondary | ICD-10-CM | POA: Diagnosis not present

## 2016-06-10 DIAGNOSIS — I5032 Chronic diastolic (congestive) heart failure: Secondary | ICD-10-CM | POA: Diagnosis not present

## 2016-06-10 DIAGNOSIS — Z48816 Encounter for surgical aftercare following surgery on the genitourinary system: Secondary | ICD-10-CM | POA: Diagnosis not present

## 2016-06-18 DIAGNOSIS — I110 Hypertensive heart disease with heart failure: Secondary | ICD-10-CM | POA: Diagnosis not present

## 2016-06-18 DIAGNOSIS — N401 Enlarged prostate with lower urinary tract symptoms: Secondary | ICD-10-CM | POA: Diagnosis not present

## 2016-06-18 DIAGNOSIS — Z48816 Encounter for surgical aftercare following surgery on the genitourinary system: Secondary | ICD-10-CM | POA: Diagnosis not present

## 2016-06-18 DIAGNOSIS — R338 Other retention of urine: Secondary | ICD-10-CM | POA: Diagnosis not present

## 2016-06-18 DIAGNOSIS — I5032 Chronic diastolic (congestive) heart failure: Secondary | ICD-10-CM | POA: Diagnosis not present

## 2016-06-18 DIAGNOSIS — Z960 Presence of urogenital implants: Secondary | ICD-10-CM | POA: Diagnosis not present

## 2016-06-19 ENCOUNTER — Ambulatory Visit: Admit: 2016-06-19 | Discharge: 2016-06-19 | Payer: MEDICARE | Attending: Medical | Primary: Family Medicine

## 2016-06-19 DIAGNOSIS — N401 Enlarged prostate with lower urinary tract symptoms: Secondary | ICD-10-CM

## 2016-06-19 LAB — AMB POC URINALYSIS DIP STICK AUTO W/O MICRO
Bilirubin (UA POC): NEGATIVE
Glucose (UA POC): NEGATIVE
Ketones (UA POC): NEGATIVE
Leukocyte esterase (UA POC): NEGATIVE
Nitrites (UA POC): NEGATIVE
Specific gravity (UA POC): 1.02 (ref 1.001–1.035)
Urobilinogen (UA POC): 0.2 (ref 0.2–1)
pH (UA POC): 8 (ref 4.6–8.0)

## 2016-06-19 LAB — AMB POC PVR, MEAS,POST-VOID RES,US,NON-IMAGING: PVR: 102 cc

## 2016-06-19 NOTE — Progress Notes (Signed)
ASSESSMENT:     ICD-10-CM ICD-9-CM    1. Benign non-nodular prostatic hyperplasia with lower urinary tract symptoms N40.1 600.91 AMB POC URINALYSIS DIP STICK AUTO W/O MICRO      AMB POC PVR, MEAS,POST-VOID RES,US,NON-IMAGING              PLAN:    ?? Discontinue Flomax 0.4 mg  ?? Continue on Proscar 5 mg to keep prostate shrunken  ?? Patient will be moving back to [facility] in 2 weeks; records have been sent to his Urologist  ?? Recommend follow up with Urologist in about 3 months with PVR               Chief Complaint   Patient presents with   ??? Benign Prostatic Hypertrophy   ??? (LUTS) Lower Urinary Tract Symptoms       HISTORY OF PRESENT ILLNESS:  Matthew Chandler is a 82 y.o. male who presents today for established diagnosis of BPH.  He is s/p UroLift 05/14/16. He had post op voiding trial on 06/03/16.  He failed to voiding trial post op and last replaced on 05/24/16. He feels like he is voiding well.  He has a decent stream and continues to improve in the last several days.  He continues to have urinary frequency and some urgency with incontinence.  This has occurred twice since his foley was removed. He denies any dysuria or gross hematuria.  He is currently on flomax and Proscar.   His PVR is 102 ml today.  ??  Pt previously lived in [place], [place]. Now lives with his daughter in [place]. Going to be moving back to [place] to [employer]. Will see Dr. Retta Diones.   ??  Pre Op:  Work up included AUA score in the severe range with refractory retention. UDS confirmed obstruction with some bladder function TRUS Vol: ~80 gms.       AUA Symptom Score 06/19/2016   Over the past month how often have you had the sensation that your bladder was not completely empty after you finished urinating? 3   Over the past month, how often have had to urinate again less than 2 hours after you last finished urinating? 2   Over the past month, how often have you found you stopped and started  again several times when you urinated? 3   Over the past month, how often have you found it difficult to postpone urination? 1   Over the past month, how often have you had a weak urinary stream? 3   Over the past month, how often have you had to push or strain to begin urinating? 1   Over the past month, how many times did you most typically get up to urinate from the time you went to bed at night until the time you got up in the morning? 5   AUA Score 18   If you were to spend the rest of your life with your urinary condition the way it is now, how would you feel about that? Pleased       Past Medical History:   Diagnosis Date   ??? Anemia, unspecified    ??? Aortic valve replaced    ??? Bacteremia 12/2015    MSSA   ??? Benign non-nodular prostatic hyperplasia with lower urinary tract symptoms    ??? Benign prostatic hyperplasia with lower urinary tract symptoms    ??? Chronic diastolic heart failure (HCC)    ??? Chronic idiopathic constipation    ???  Generalized edema    ??? GERD without esophagitis    ??? Gross hematuria    ??? History of aortic valve replacement with bioprosthetic valve    ??? History of depression    ??? 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 Hypokalemia    ??? Hyponatremia with excess extracellular fluid volume    ??? Long-term use of high-risk medication    ??? Muscle cramps    ??? Muscular deconditioning    ??? Reactive depression    ??? Spinal stenosis of lumbar region    ??? Typical atrial flutter (HCC)    ??? Urinary retention        Past Surgical History:   Procedure Laterality Date   ??? HX APPENDECTOMY     ??? HX CATARACT REMOVAL     ??? HX CORONARY ARTERY BYPASS GRAFT  05/2013   ??? HX OTHER SURGICAL      NECK SURGERY   ??? HX TONSILLECTOMY     ??? HX UROLOGICAL  05/14/2016    [facility] - Cystoscopy and UroLift (transprostatic implants #10), Dr Asher MuirGregg Eure       Social History   Substance Use Topics   ??? Smoking status: Never Smoker   ??? Smokeless tobacco: Never Used   ??? Alcohol use None       No Known Allergies     History reviewed. No pertinent family history.    Current Outpatient Prescriptions   Medication Sig Dispense Refill   ??? apixaban (ELIQUIS) 2.5 mg tablet 2.5 mg.     ??? tamsulosin (FLOMAX) 0.4 mg capsule Take 1 Cap by mouth daily. 90 Cap 3   ??? citalopram (CELEXA) 10 mg tablet TK 1 T PO QD IN THE MORNING  11   ??? finasteride (PROSCAR) 5 mg tablet TK 1 T PO QD  3   ??? folic acid (FOLVITE) 1 mg tablet TK 1 T PO QD  3   ??? furosemide (LASIX) 40 mg tablet TAKE ONE-HALF TABLET PO PRF FLUID OR EDEMA  9   ??? omeprazole (PRILOSEC) 20 mg capsule Take 20 mg by mouth daily.     ??? hydrocortisone (CORTIZONE) 0.5 % ointment Apply  to affected area two (2) times a day. use thin layer     ??? polyethylene glycol (MIRALAX) 17 gram packet Take 17 g by mouth daily.     ??? senna-docusate (SENOKOT-S) 8.6-50 mg per tablet Take 1 Tab by mouth daily.     ??? fluticasone (FLONASE) 50 mcg/actuation nasal spray 2 Sprays by Both Nostrils route daily.     ??? loratadine (CLARITIN) 10 mg tablet Take 10 mg by mouth.     ??? multivitamin (ONE A DAY) tablet Take 1 Tab by mouth daily.     ??? VIT A/VIT C/VIT E/ZINC/COPPER (PRESERVISION AREDS PO) Take  by mouth.     ??? magnesium oxide (MAG-OX) 400 mg tablet Take 400 mg by mouth daily.     ??? ferrous sulfate 325 mg (65 mg iron) cpER Take  by mouth.           Review of Systems  Constitutional: Fever: No  Skin: Rash: Yes  HEENT: Hearing difficulty: Yes  Eyes: Blurred vision: No  Cardiovascular: Chest pain: No  Respiratory: Shortness of breath: Yes  Gastrointestinal: Nausea/vomiting: No  Musculoskeletal: Back pain: No  Neurological: Weakness: No  Psychological: Memory loss: No  Comments/additional findings:       Visit Vitals   ??? BP 119/80   ???  Ht 5\' 7"  (1.702 m)   ??? Wt 151 lb (68.5 kg)   ??? BMI 23.65 kg/m2     Constitutional: WDWN, Pleasant and appropriate affect, No acute distress.    CV:  No peripheral swelling noted  Respiratory: No respiratory distress or difficulties  Skin: No jaundice.     Neuro/Psych:  Alert and oriented x 3. Affect appropriate.         REVIEW OF LABS AND IMAGING:      Results for orders placed or performed in visit on 06/19/16   AMB POC URINALYSIS DIP STICK AUTO W/O MICRO   Result Value Ref Range    Color (UA POC) Yellow     Clarity (UA POC) Clear     Glucose (UA POC) Negative Negative    Bilirubin (UA POC) Negative Negative    Ketones (UA POC) Negative Negative    Specific gravity (UA POC) 1.020 1.001 - 1.035    Blood (UA POC) 1+ Negative    pH (UA POC) 8.0 4.6 - 8.0    Protein (UA POC) Trace Negative mg/dL    Urobilinogen (UA POC) 0.2 mg/dL 0.2 - 1    Nitrites (UA POC) Negative Negative    Leukocyte esterase (UA POC) Negative Negative   AMB POC PVR, MEAS,POST-VOID RES,US,NON-IMAGING   Result Value Ref Range    PVR 102 cc           A copy of today's office visit with all pertinent imaging results and labs were sent to the referring physician.        Lynnae SandhoffKristin M. Sherry Blackard, PA-C    I have reviewed the treatment and agree with the plan of action.    Karie Fetchaman Unnikrishnan, MD

## 2016-06-20 DIAGNOSIS — K219 Gastro-esophageal reflux disease without esophagitis: Secondary | ICD-10-CM | POA: Diagnosis not present

## 2016-06-20 DIAGNOSIS — Z954 Presence of other heart-valve replacement: Secondary | ICD-10-CM | POA: Diagnosis not present

## 2016-06-20 DIAGNOSIS — Z951 Presence of aortocoronary bypass graft: Secondary | ICD-10-CM | POA: Diagnosis not present

## 2016-06-20 DIAGNOSIS — I5032 Chronic diastolic (congestive) heart failure: Secondary | ICD-10-CM | POA: Diagnosis not present

## 2016-06-20 DIAGNOSIS — I483 Typical atrial flutter: Secondary | ICD-10-CM | POA: Diagnosis not present

## 2016-06-20 DIAGNOSIS — R339 Retention of urine, unspecified: Secondary | ICD-10-CM | POA: Diagnosis not present

## 2016-06-20 DIAGNOSIS — N401 Enlarged prostate with lower urinary tract symptoms: Secondary | ICD-10-CM | POA: Diagnosis not present

## 2016-06-24 NOTE — Telephone Encounter (Signed)
Patient daughter Jed LimerickLee Scheerer-Leidy called and stated she is confused on whether her father should be taking the flomax 0.4. Per Dr.Eure, patient should continue flomax 0.4 and a new rx was sent to the pharmacy. Per P.A Curry, the patient is to discontinue the flomax. Now that the patient has stopped the medication, he is having urinary issues but she wanted to make sure its ok to take the medication. Please clarify and contact the patient daughter. Message routed. Thank you.

## 2016-06-24 NOTE — Telephone Encounter (Signed)
I placed a call back to Ms. Barbera SettersLeidy and explained per Karlyne Greenspanurry, PA-C, if patient is not currently having urinary symptoms (weak stream) he does not need to continue Flomax as he had surgery, if he does have slow stream, he can continue it. Ms. Barbera SettersLeidy said that she will advise patient and he will decide if he wants to take it. She expressed understanding, thanked me, and call was ended.  Lexine BatonKinshata Rama Sorci, [facility]MA

## 2016-06-26 ENCOUNTER — Encounter: Attending: Urology | Primary: Family Medicine

## 2016-06-27 DIAGNOSIS — M545 Low back pain: Secondary | ICD-10-CM | POA: Diagnosis not present

## 2016-06-27 DIAGNOSIS — Z48816 Encounter for surgical aftercare following surgery on the genitourinary system: Secondary | ICD-10-CM | POA: Diagnosis not present

## 2016-06-27 DIAGNOSIS — I110 Hypertensive heart disease with heart failure: Secondary | ICD-10-CM | POA: Diagnosis not present

## 2016-06-27 DIAGNOSIS — Z960 Presence of urogenital implants: Secondary | ICD-10-CM | POA: Diagnosis not present

## 2016-06-27 DIAGNOSIS — R338 Other retention of urine: Secondary | ICD-10-CM | POA: Diagnosis not present

## 2016-06-27 DIAGNOSIS — I5032 Chronic diastolic (congestive) heart failure: Secondary | ICD-10-CM | POA: Diagnosis not present

## 2016-06-27 DIAGNOSIS — R2689 Other abnormalities of gait and mobility: Secondary | ICD-10-CM | POA: Diagnosis not present

## 2016-06-27 DIAGNOSIS — M25512 Pain in left shoulder: Secondary | ICD-10-CM | POA: Diagnosis not present

## 2016-06-27 DIAGNOSIS — M6281 Muscle weakness (generalized): Secondary | ICD-10-CM | POA: Diagnosis not present

## 2016-06-27 DIAGNOSIS — N401 Enlarged prostate with lower urinary tract symptoms: Secondary | ICD-10-CM | POA: Diagnosis not present

## 2016-07-04 DIAGNOSIS — M6281 Muscle weakness (generalized): Secondary | ICD-10-CM | POA: Diagnosis not present

## 2016-07-04 DIAGNOSIS — M545 Low back pain: Secondary | ICD-10-CM | POA: Diagnosis not present

## 2016-07-04 DIAGNOSIS — R2689 Other abnormalities of gait and mobility: Secondary | ICD-10-CM | POA: Diagnosis not present

## 2016-07-04 DIAGNOSIS — R278 Other lack of coordination: Secondary | ICD-10-CM | POA: Diagnosis not present

## 2016-07-05 DIAGNOSIS — N400 Enlarged prostate without lower urinary tract symptoms: Secondary | ICD-10-CM | POA: Diagnosis not present

## 2016-07-05 DIAGNOSIS — N183 Chronic kidney disease, stage 3 (moderate): Secondary | ICD-10-CM | POA: Diagnosis not present

## 2016-07-05 DIAGNOSIS — I509 Heart failure, unspecified: Secondary | ICD-10-CM | POA: Diagnosis not present

## 2016-07-05 DIAGNOSIS — I25810 Atherosclerosis of coronary artery bypass graft(s) without angina pectoris: Secondary | ICD-10-CM | POA: Diagnosis not present

## 2016-07-05 DIAGNOSIS — Z23 Encounter for immunization: Secondary | ICD-10-CM | POA: Diagnosis not present

## 2016-07-05 DIAGNOSIS — I4892 Unspecified atrial flutter: Secondary | ICD-10-CM | POA: Diagnosis not present

## 2016-07-05 DIAGNOSIS — F419 Anxiety disorder, unspecified: Secondary | ICD-10-CM | POA: Diagnosis not present

## 2016-07-08 DIAGNOSIS — R2689 Other abnormalities of gait and mobility: Secondary | ICD-10-CM | POA: Diagnosis not present

## 2016-07-08 DIAGNOSIS — M545 Low back pain: Secondary | ICD-10-CM | POA: Diagnosis not present

## 2016-07-08 DIAGNOSIS — R278 Other lack of coordination: Secondary | ICD-10-CM | POA: Diagnosis not present

## 2016-07-08 DIAGNOSIS — M6281 Muscle weakness (generalized): Secondary | ICD-10-CM | POA: Diagnosis not present

## 2016-07-10 DIAGNOSIS — M6281 Muscle weakness (generalized): Secondary | ICD-10-CM | POA: Diagnosis not present

## 2016-07-10 DIAGNOSIS — M545 Low back pain: Secondary | ICD-10-CM | POA: Diagnosis not present

## 2016-07-10 DIAGNOSIS — R2689 Other abnormalities of gait and mobility: Secondary | ICD-10-CM | POA: Diagnosis not present

## 2016-07-10 DIAGNOSIS — R278 Other lack of coordination: Secondary | ICD-10-CM | POA: Diagnosis not present

## 2016-07-11 DIAGNOSIS — R2689 Other abnormalities of gait and mobility: Secondary | ICD-10-CM | POA: Diagnosis not present

## 2016-07-11 DIAGNOSIS — M6281 Muscle weakness (generalized): Secondary | ICD-10-CM | POA: Diagnosis not present

## 2016-07-11 DIAGNOSIS — R278 Other lack of coordination: Secondary | ICD-10-CM | POA: Diagnosis not present

## 2016-07-11 DIAGNOSIS — M545 Low back pain: Secondary | ICD-10-CM | POA: Diagnosis not present

## 2016-07-15 DIAGNOSIS — R2689 Other abnormalities of gait and mobility: Secondary | ICD-10-CM | POA: Diagnosis not present

## 2016-07-15 DIAGNOSIS — M545 Low back pain: Secondary | ICD-10-CM | POA: Diagnosis not present

## 2016-07-15 DIAGNOSIS — R278 Other lack of coordination: Secondary | ICD-10-CM | POA: Diagnosis not present

## 2016-07-15 DIAGNOSIS — M6281 Muscle weakness (generalized): Secondary | ICD-10-CM | POA: Diagnosis not present

## 2016-07-17 DIAGNOSIS — R278 Other lack of coordination: Secondary | ICD-10-CM | POA: Diagnosis not present

## 2016-07-17 DIAGNOSIS — M6281 Muscle weakness (generalized): Secondary | ICD-10-CM | POA: Diagnosis not present

## 2016-07-17 DIAGNOSIS — M545 Low back pain: Secondary | ICD-10-CM | POA: Diagnosis not present

## 2016-07-17 DIAGNOSIS — R2689 Other abnormalities of gait and mobility: Secondary | ICD-10-CM | POA: Diagnosis not present

## 2016-07-18 DIAGNOSIS — M545 Low back pain: Secondary | ICD-10-CM | POA: Diagnosis not present

## 2016-07-18 DIAGNOSIS — R278 Other lack of coordination: Secondary | ICD-10-CM | POA: Diagnosis not present

## 2016-07-18 DIAGNOSIS — M6281 Muscle weakness (generalized): Secondary | ICD-10-CM | POA: Diagnosis not present

## 2016-07-18 DIAGNOSIS — R2689 Other abnormalities of gait and mobility: Secondary | ICD-10-CM | POA: Diagnosis not present

## 2016-07-22 DIAGNOSIS — R2689 Other abnormalities of gait and mobility: Secondary | ICD-10-CM | POA: Diagnosis not present

## 2016-07-22 DIAGNOSIS — M6281 Muscle weakness (generalized): Secondary | ICD-10-CM | POA: Diagnosis not present

## 2016-07-22 DIAGNOSIS — R278 Other lack of coordination: Secondary | ICD-10-CM | POA: Diagnosis not present

## 2016-07-22 DIAGNOSIS — M545 Low back pain: Secondary | ICD-10-CM | POA: Diagnosis not present

## 2016-07-25 ENCOUNTER — Telehealth: Payer: Self-pay | Admitting: Cardiology

## 2016-07-25 DIAGNOSIS — R2689 Other abnormalities of gait and mobility: Secondary | ICD-10-CM | POA: Diagnosis not present

## 2016-07-25 DIAGNOSIS — M6281 Muscle weakness (generalized): Secondary | ICD-10-CM | POA: Diagnosis not present

## 2016-07-25 DIAGNOSIS — R278 Other lack of coordination: Secondary | ICD-10-CM | POA: Diagnosis not present

## 2016-07-25 DIAGNOSIS — M545 Low back pain: Secondary | ICD-10-CM | POA: Diagnosis not present

## 2016-07-25 NOTE — Telephone Encounter (Signed)
Dtr lives in [place] (4 1/2 hrs from [place]).  Patient and his wife are residing at [facility] in [place] living.  Dtr received a call from PT today stating his symptoms below. PT stated to dtr that they were palpating regular/irregular heart beat. They told dtr they tried to call patient's PCP.  As it is now after hours in the office - advised to take patient to ED to be evaluated.  She is unsure that patient will go.  She and I spent over 20 minutes over the phone discussing patient and his hx and current health status.  Very strong feeling that this is PVCs/bradycardia.  She will call and talk with patient to decide course of action. Patient was scheduled (by scheduling) to see [facility] tomorrow morning. Dtr would like to keep this appt in case patient does not go to ED tonight.  She understands that if PPM is recommended that patient will be taken to hospital or scheduled to see [facility] sooner.   I will forward this to [place] for her FYI and to follow up with the dtr about how patient is doing, did he go to ED, does he need appt w/ Lovena Le next week.  Dtr thanks me for taking so much time talking/discussing with her.  Advised her to call office in morning to cancel appt w/ [facility] if they decide to go to ED tonight.

## 2016-07-25 NOTE — Telephone Encounter (Signed)
New Message  Pts daughter voiced his resting HR 54-60 and while walking it's 44-46 and he's feeling extremely fatigue.  Please f/u with pt

## 2016-07-26 ENCOUNTER — Encounter: Payer: Self-pay | Admitting: Cardiology

## 2016-07-26 ENCOUNTER — Ambulatory Visit (INDEPENDENT_AMBULATORY_CARE_PROVIDER_SITE_OTHER): Payer: Medicare Other | Admitting: Cardiology

## 2016-07-26 VITALS — BP 122/66 | HR 64 | Ht 68.0 in | Wt 151.4 lb

## 2016-07-26 DIAGNOSIS — Z951 Presence of aortocoronary bypass graft: Secondary | ICD-10-CM | POA: Diagnosis not present

## 2016-07-26 DIAGNOSIS — I5032 Chronic diastolic (congestive) heart failure: Secondary | ICD-10-CM | POA: Diagnosis not present

## 2016-07-26 DIAGNOSIS — Z953 Presence of xenogenic heart valve: Secondary | ICD-10-CM

## 2016-07-26 DIAGNOSIS — Z954 Presence of other heart-valve replacement: Secondary | ICD-10-CM

## 2016-07-26 DIAGNOSIS — I452 Bifascicular block: Secondary | ICD-10-CM

## 2016-07-26 DIAGNOSIS — I484 Atypical atrial flutter: Secondary | ICD-10-CM

## 2016-07-26 DIAGNOSIS — Z7901 Long term (current) use of anticoagulants: Secondary | ICD-10-CM

## 2016-07-26 DIAGNOSIS — I440 Atrioventricular block, first degree: Secondary | ICD-10-CM | POA: Diagnosis not present

## 2016-07-26 DIAGNOSIS — I4892 Unspecified atrial flutter: Secondary | ICD-10-CM

## 2016-07-26 DIAGNOSIS — R001 Bradycardia, unspecified: Secondary | ICD-10-CM

## 2016-07-26 NOTE — Progress Notes (Signed)
[facility]. [address] [place], [place]  [address] Phone: [phone] Fax:  [phone]  Date:  07/26/2016   ID:  Joseph Garner, DOB [birth year], MRN [id number]  PCP:  Wenda Low, MD   History of Present Illness: Joseph Garner is a 82 y.o. male with bioprosthetic aortic valve replacement 06/04/13, CBAG x 1 with postoperative readmission to hospital secondary to acute diastolic heart failure, pneumonitis with cessation of amiodarone here for followup. He had prolonged stay at [facility] rehabilitation.  He was hospitalized in March 2017 for 12 days with severe muscle spasms. He was quite debilitated when he left the hospital, in a wheelchair. He underwent extensive physical therapy and actually stayed in [place] with his only daughter. He enjoyed the physiotherapist there who accommodated him well. Now that he is back in [place], he is undergoing physical therapy but thinks that they are working him too hard. He does not enjoy the weight lifting on his legs.  He has been to the emergency room a couple times with sensation of possible anxiety. Dr. Lysle Rubens and given him some Xanax which he says has helped.  Had bypass x1, diagonal branch.   First-degree heart block noted. Appreciate Dr. Tanna Furry consultation. Does not require pacemaker at this time.   Had issues with fleeting severe chest discomfort, lasted 1 second duration, electrical. Likely musculoskeletal.    Wt Readings from Last 3 Encounters:  07/26/16 151 lb 6.4 oz (68.7 kg)  01/20/16 160 lb 0.9 oz (72.6 kg)  01/19/16 160 lb (72.6 kg)     Past Medical History:  Diagnosis Date  . Actinic keratosis, hx of   . Allergy    allergic rhinitis  . Anemia   . Arthritis    generalized-more back issues  . Atrial flutter ([place])   . BPH (benign prostatic hypertrophy)   . CHF (congestive heart failure) ([place])   . Chronic diastolic heart failure ([place]) 08/11/2013  . CKD (chronic kidney disease) stage 3, GFR 30-59 ml/min     . Depression   . Diverticulosis 2006  . GERD (gastroesophageal reflux disease)   . Hearing loss    wears bilateral hearing aids  . Hypertension   . Inguinal hernia    left side -surgery planned  . Lung nodules 2010  . Macular degeneration   . Nodule of right lung 05/24/2013  . Severe aortic stenosis 04/16/2013  . Shortness of breath   . Spinal stenosis     Past Surgical History:  Procedure Laterality Date  . AORTIC VALVE REPLACEMENT N/A 06/04/2013   Procedure: AORTIC VALVE REPLACEMENT (AVR);  Surgeon: Grace Isaac, MD;  Location: [facility];  Service: Open Heart Surgery;  Laterality: N/A;  . APPENDECTOMY  1947  . CATARACT EXTRACTION, BILATERAL     bilateral  . CORONARY ARTERY BYPASS GRAFT N/A 06/04/2013   Procedure: CORONARY ARTERY BYPASS GRAFTING (CABG);  Surgeon: Grace Isaac, MD;  Location: [facility];  Service: Open Heart Surgery;  Laterality: N/A;  . HERNIA REPAIR  02/24/13   lap LIH repair  . INGUINAL HERNIA REPAIR Left 02/11/2013   Procedure: LEFT LAPAROSCOPIC REPAIR INGUINAL HERNIA WITH MESH;  Surgeon: Adin Hector, MD;  Location: [facility];  Service: General;  Laterality: Left;  . INSERTION OF MESH Left 02/11/2013   Procedure: INSERTION OF MESH;  Surgeon: Adin Hector, MD;  Location: [facility];  Service: General;  Laterality: Left;  . INTRAOPERATIVE TRANSESOPHAGEAL ECHOCARDIOGRAM N/A 06/04/2013   Procedure: INTRAOPERATIVE TRANSESOPHAGEAL  ECHOCARDIOGRAM;  Surgeon: Grace Isaac, MD;  Location: [facility];  Service: Open Heart Surgery;  Laterality: N/A;  . NECK SURGERY  1997   compressed nerve  . SKIN BIOPSY  2003-2011   multiple   . TEE WITHOUT CARDIOVERSION N/A 01/23/2016   Procedure: TRANSESOPHAGEAL ECHOCARDIOGRAM (TEE);  Surgeon: Larey Dresser, MD;  Location: [facility];  Service: Cardiovascular;  Laterality: N/A;  . TONSILLECTOMY  1933  . WEDGE RESECTION Right 06/04/2013   Procedure: WEDGE RESECTION Right Lower Lobe;  Surgeon: Grace Isaac, MD;  Location: [facility];   Service: Open Heart Surgery;  Laterality: Right;    Current Outpatient Prescriptions:  .  amoxicillin (AMOXIL) 500 MG capsule, 4 capsules one time as needed for dental procedure, Disp: , Rfl:  .  apixaban (ELIQUIS) 2.5 MG TABS tablet, Take 1 tablet (2.5 mg total) by mouth 2 (two) times daily., Disp: 60 tablet, Rfl: 11 .  finasteride (PROSCAR) 5 MG tablet, Take 1 tablet (5 mg total) by mouth daily., Disp: 30 tablet, Rfl: 0 .  fluticasone (FLONASE) 50 MCG/ACT nasal spray, Place 2 sprays into the nose daily as needed for allergies. , Disp: , Rfl:  .  folic acid (FOLVITE) 1 MG tablet, Take 1 tablet (1 mg total) by mouth daily., Disp: , Rfl:  .  furosemide (LASIX) 40 MG tablet, Take 0.5 tablets (20 mg total) by mouth as needed for fluid or edema (Patient takes 1/2 pill)., Disp: 15 tablet, Rfl: 9 .  loratadine (CLARITIN) 10 MG tablet, Take 10 mg by mouth daily as needed for allergies. , Disp: , Rfl:  .  magnesium oxide (MAG-OX) 400 MG tablet, Take 400 mg by mouth daily., Disp: , Rfl:  .  Multiple Vitamin (MULTIVITAMIN WITH MINERALS) TABS, Take 1 tablet by mouth daily., Disp: , Rfl:  .  Multiple Vitamins-Minerals (EYE VITAMINS PO), Take 1 tablet by mouth daily. Perservision Vitamin, Disp: , Rfl:  .  omeprazole (PRILOSEC) 20 MG capsule, Take 20 mg by mouth daily., Disp: , Rfl:  .  tamsulosin (FLOMAX) 0.4 MG CAPS capsule, Take 0.4 mg by mouth daily. , Disp: , Rfl:  .  traMADol (ULTRAM) 50 MG tablet, Take by mouth every 6 (six) hours as needed for moderate pain. Reported on 12/11/2015, Disp: , Rfl:     Allergies:   No Known Allergies  Social History:  The patient  reports that he quit smoking about 48 years ago. His smoking use included Cigarettes. He has a 30.00 pack-year smoking history. He has never used smokeless tobacco. He reports that he drinks alcohol. He reports that he does not use drugs.   ROS:  Please see the history of present illness.   Denies any chest pain, syncope, fevers, orthopnea,  PND.   All other systems reviewed and negative. Occasional flank pain/musculoskeletal discomfort.  PHYSICAL EXAM: VS:  BP 122/66   Pulse 64   Ht 5\' 8"  (1.727 m)   Wt 151 lb 6.4 oz (68.7 kg)   BMI 23.02 kg/m  Well nourished, well developed, in no acute distress Elderly HEENT: normal  Neck: no JVD  Cardiac:  normal S1, S2; RRR; soft systolic murmur  Lungs:  clear to auscultation bilaterally, no wheezing, rhonchi or rales  Abd: soft, nontender, no hepatomegaly  Ext: no edema  bruising. Skin: warm and dry  Neuro: no focal abnormalities noted, anxious  Echocardiogram 06/21/13-normal functioning bioprosthetic valve. Normal EF.  EKG:  EKG ordered today 07/26/16-atrial flutter with variable conduction, 4-1, heart rate 52  bpm.  -06/23/15-sinus rhythm, first-degree AV block, 424 ms. 06/10/14-sinus rhythm, nonspecific ST-T wave change, first degree AV block, 338 ms. No longer right bundle branch block    Holter 09/25/15:  15000 PVC's, 4000 PAC's  Ventricular bigeminy/trigeminy/rare couplet  Brief PAT (parox atrial tach)  No AFIB  2.1 sec R-R during sleep. No pauses greater than 3 sec  First degree AVB at baseline  No significant bradycardia   Reviewed Dr. Lovena Le EP note. No pacemaker at this time. Anticoagulation started. ECG 09/07/15 - atrial flutter.   Candee Furbish, MD  ASSESSMENT AND PLAN:   1. Aortic stenosis/bioprosthetic aortic valve replacement-long rehabilitation efforts at [facility] following surgery.   Echocardiogram reviewed. Valve is functioning well. Continue with dental antibiotic prophylaxis 2. Deconditioning-he was hospitalized in March for several days with muscle cramps/spasms. He left the hospital in a wheelchair. Quite debilitated. He has been through heavy physical therapy living with his daughter in [place] for some time. He is doing better. However, he is complaining about the amount of work that they're asking him to do especially with weights on his  legs. Asked him to review this with Dr. Lysle Rubens for further guidance. 3. Atrial flutter, asymptomatic. On anticoagulation. He asked that this would affect his speech. Only if he had mini strokes associated with this. This would not change management. 4. Diastolic heart failure-currently well compensated.  Dilated atria noted on echocardiogram with normal ejection fraction. Had multiple questions asked and answered.  5. CAD-bypass times one, diagonal branch. ASA. Easy bruising. Lipitor. 6.  atypical chest pain- fleeting, short-lived, left-sided, likely musculoskeletal, cramping. EKG reassuring.  7.  First-degree AV block- . No symptoms, no syncope. During physical therapy, heart rate dropped into the 40s. If heart rate decreases more significantly, he may require pacemaker. We discussed. Appreciate Dr. Tanna Furry consultation. No need for pacemaker at this time. 8. We will see him back in 8 months. He is Seeing Dr. Lovena Le in Nov 2017.  Signed, Candee Furbish, MD [facility]  07/26/2016 9:55 AM

## 2016-07-26 NOTE — Patient Instructions (Signed)
Medication Instructions:  The current medical regimen is effective;  continue present plan and medications.  Follow-Up: Follow up in 8 months with Dr. Marlou Porch.  You will receive a letter in the mail 2 months before you are due.  Please call us when you receive this letter to schedule your follow up appointment.  If you need a refill on your cardiac medications before your next appointment, please call your pharmacy.  Thank you for choosing [facility]!!

## 2016-07-26 NOTE — Telephone Encounter (Signed)
Called pt's daughter, Hillis Range to get update on pt. Pt's daughter said pt is ate 100% of dinner last night and seems to be feeling better. Pt did not go to the hospital last night.  Daughter thinks some of her father's fatigue could be related to his physical therapy. Daughter stated pt goes to physical therapy 3 times a week at the independent living facility. Daughter and pt are on their way to his appt this morning with Dr. Marlou Porch. Informed if Dr. Marlou Porch recommends appointment with Dr. Lovena Le  for PPM, we will try to get pt scheduled next week. Daughter was pleasant and verbalized understanding.

## 2016-07-26 NOTE — Telephone Encounter (Signed)
Pt evaluated in office today by Dr Marlou Porch.  No changes in care and he should follow up with Dr Marlou Porch in 8 months.  Pt and daughter aware.

## 2016-07-29 DIAGNOSIS — M6281 Muscle weakness (generalized): Secondary | ICD-10-CM | POA: Diagnosis not present

## 2016-07-29 DIAGNOSIS — R278 Other lack of coordination: Secondary | ICD-10-CM | POA: Diagnosis not present

## 2016-07-29 DIAGNOSIS — M545 Low back pain: Secondary | ICD-10-CM | POA: Diagnosis not present

## 2016-07-29 DIAGNOSIS — R2689 Other abnormalities of gait and mobility: Secondary | ICD-10-CM | POA: Diagnosis not present

## 2016-08-01 DIAGNOSIS — M545 Low back pain: Secondary | ICD-10-CM | POA: Diagnosis not present

## 2016-08-01 DIAGNOSIS — R278 Other lack of coordination: Secondary | ICD-10-CM | POA: Diagnosis not present

## 2016-08-01 DIAGNOSIS — R2689 Other abnormalities of gait and mobility: Secondary | ICD-10-CM | POA: Diagnosis not present

## 2016-08-01 DIAGNOSIS — M6281 Muscle weakness (generalized): Secondary | ICD-10-CM | POA: Diagnosis not present

## 2016-08-05 DIAGNOSIS — M545 Low back pain: Secondary | ICD-10-CM | POA: Diagnosis not present

## 2016-08-05 DIAGNOSIS — R278 Other lack of coordination: Secondary | ICD-10-CM | POA: Diagnosis not present

## 2016-08-05 DIAGNOSIS — R2689 Other abnormalities of gait and mobility: Secondary | ICD-10-CM | POA: Diagnosis not present

## 2016-08-05 DIAGNOSIS — M6281 Muscle weakness (generalized): Secondary | ICD-10-CM | POA: Diagnosis not present

## 2016-08-08 DIAGNOSIS — M545 Low back pain: Secondary | ICD-10-CM | POA: Diagnosis not present

## 2016-08-08 DIAGNOSIS — R2689 Other abnormalities of gait and mobility: Secondary | ICD-10-CM | POA: Diagnosis not present

## 2016-08-08 DIAGNOSIS — R278 Other lack of coordination: Secondary | ICD-10-CM | POA: Diagnosis not present

## 2016-08-08 DIAGNOSIS — M6281 Muscle weakness (generalized): Secondary | ICD-10-CM | POA: Diagnosis not present

## 2016-08-12 ENCOUNTER — Telehealth: Payer: Self-pay | Admitting: Cardiology

## 2016-08-12 NOTE — Telephone Encounter (Signed)
I spoke with patient. He states he is unsure if he should be taking 20 or 40mg  of Lasix. I advised he is to take 20mg  PRN fluid. He states he has appt w/ Dr Lovena Le 11/3, should he keep it. I advised him to keep, this is a device check. He voiced understanding and agreed with plan.

## 2016-08-12 NOTE — Telephone Encounter (Signed)
New Message:     Please call question about his Furosemide. Should he be taking 20 mg or 40 mg?

## 2016-08-15 DIAGNOSIS — R2689 Other abnormalities of gait and mobility: Secondary | ICD-10-CM | POA: Diagnosis not present

## 2016-08-15 DIAGNOSIS — R278 Other lack of coordination: Secondary | ICD-10-CM | POA: Diagnosis not present

## 2016-08-15 DIAGNOSIS — M545 Low back pain: Secondary | ICD-10-CM | POA: Diagnosis not present

## 2016-08-15 DIAGNOSIS — M6281 Muscle weakness (generalized): Secondary | ICD-10-CM | POA: Diagnosis not present

## 2016-08-20 ENCOUNTER — Encounter: Payer: Self-pay | Admitting: Internal Medicine

## 2016-08-22 DIAGNOSIS — R278 Other lack of coordination: Secondary | ICD-10-CM | POA: Diagnosis not present

## 2016-08-22 DIAGNOSIS — R2689 Other abnormalities of gait and mobility: Secondary | ICD-10-CM | POA: Diagnosis not present

## 2016-08-22 DIAGNOSIS — M545 Low back pain: Secondary | ICD-10-CM | POA: Diagnosis not present

## 2016-08-22 DIAGNOSIS — M6281 Muscle weakness (generalized): Secondary | ICD-10-CM | POA: Diagnosis not present

## 2016-08-26 DIAGNOSIS — M545 Low back pain: Secondary | ICD-10-CM | POA: Diagnosis not present

## 2016-08-26 DIAGNOSIS — R278 Other lack of coordination: Secondary | ICD-10-CM | POA: Diagnosis not present

## 2016-08-26 DIAGNOSIS — R2689 Other abnormalities of gait and mobility: Secondary | ICD-10-CM | POA: Diagnosis not present

## 2016-08-26 DIAGNOSIS — M6281 Muscle weakness (generalized): Secondary | ICD-10-CM | POA: Diagnosis not present

## 2016-08-29 DIAGNOSIS — R2689 Other abnormalities of gait and mobility: Secondary | ICD-10-CM | POA: Diagnosis not present

## 2016-08-29 DIAGNOSIS — M6281 Muscle weakness (generalized): Secondary | ICD-10-CM | POA: Diagnosis not present

## 2016-08-29 DIAGNOSIS — M545 Low back pain: Secondary | ICD-10-CM | POA: Diagnosis not present

## 2016-08-29 DIAGNOSIS — R278 Other lack of coordination: Secondary | ICD-10-CM | POA: Diagnosis not present

## 2016-08-30 ENCOUNTER — Ambulatory Visit: Payer: Medicare Other | Admitting: Cardiology

## 2016-08-30 ENCOUNTER — Ambulatory Visit (INDEPENDENT_AMBULATORY_CARE_PROVIDER_SITE_OTHER): Payer: Medicare Other | Admitting: Internal Medicine

## 2016-08-30 ENCOUNTER — Encounter: Payer: Self-pay | Admitting: Internal Medicine

## 2016-08-30 ENCOUNTER — Ambulatory Visit
Admission: RE | Admit: 2016-08-30 | Discharge: 2016-08-30 | Disposition: A | Discharge status: Home / Self Care | Payer: Medicare Other | Source: Ambulatory Visit | Attending: Nurse Practitioner | Admitting: Nurse Practitioner

## 2016-08-30 ENCOUNTER — Other Ambulatory Visit: Payer: Self-pay | Admitting: Nurse Practitioner

## 2016-08-30 VITALS — BP 132/64 | HR 58 | Ht 68.0 in | Wt 151.0 lb

## 2016-08-30 DIAGNOSIS — I495 Sick sinus syndrome: Secondary | ICD-10-CM

## 2016-08-30 DIAGNOSIS — I484 Atypical atrial flutter: Secondary | ICD-10-CM

## 2016-08-30 DIAGNOSIS — N401 Enlarged prostate with lower urinary tract symptoms: Secondary | ICD-10-CM | POA: Diagnosis not present

## 2016-08-30 DIAGNOSIS — R059 Cough, unspecified: Secondary | ICD-10-CM

## 2016-08-30 DIAGNOSIS — R05 Cough: Secondary | ICD-10-CM

## 2016-08-30 DIAGNOSIS — R350 Frequency of micturition: Secondary | ICD-10-CM | POA: Diagnosis not present

## 2016-08-30 MED ORDER — APIXABAN 2.5 MG PO TABS: 2.5000 mg | ORAL_TABLET | Freq: Two times a day (BID) | ORAL | 11 refills | Status: DC

## 2016-08-30 NOTE — Progress Notes (Signed)
HPI [name] returns today for ongoing followup of atrial flutter and bradycardia. He is a pleasant 82 yo man with prior AVR (tissue valve), CAD, s/p CABG, who has had documented bradycardia. He has never had syncope. He was found to have atypical atrial flutter. He was asymptomatic. He has never had syncope or unexplained falls. He wore a cardiac monitor which demonstrated no symptomatic bradycardia. No other complaints. He does not have palpitations. He has been stable since his last visit although he remains bothered by anxiety. He has minimal symptoms related to heart racing. No Known Allergies   Current Outpatient Prescriptions  Medication Sig Dispense Refill  . amoxicillin (AMOXIL) 500 MG capsule 4 capsules one time as needed for dental procedure    . apixaban (ELIQUIS) 2.5 MG TABS tablet Take 1 tablet (2.5 mg total) by mouth 2 (two) times daily. 60 tablet 11  . citalopram (CELEXA) 10 MG tablet Take 10 mg by mouth daily.    . finasteride (PROSCAR) 5 MG tablet Take 1 tablet (5 mg total) by mouth daily. 30 tablet 0  . fluticasone (FLONASE) 50 MCG/ACT nasal spray Place 2 sprays into the nose daily as needed for allergies.     . folic acid (FOLVITE) 1 MG tablet Take 1 tablet (1 mg total) by mouth daily.    . furosemide (LASIX) 40 MG tablet Take 0.5 tablets (20 mg total) by mouth as needed for fluid or edema (Patient takes 1/2 pill). 15 tablet 9  . loratadine (CLARITIN) 10 MG tablet Take 10 mg by mouth daily as needed for allergies.     . magnesium oxide (MAG-OX) 400 MG tablet Take 400 mg by mouth daily.    . Multiple Vitamin (MULTIVITAMIN WITH MINERALS) TABS Take 1 tablet by mouth daily.    . Multiple Vitamins-Minerals (EYE VITAMINS PO) Take 1 tablet by mouth daily. Perservision Vitamin    . omeprazole (PRILOSEC) 20 MG capsule Take 20 mg by mouth daily.    . tamsulosin (FLOMAX) 0.4 MG CAPS capsule Take 0.4 mg by mouth daily.     . traMADol (ULTRAM) 50 MG tablet Take by mouth every 6  (six) hours as needed for moderate pain. Reported on 12/11/2015     No current facility-administered medications for this visit.      Past Medical History:  Diagnosis Date  . Actinic keratosis, hx of   . Allergy    allergic rhinitis  . Anemia   . Arthritis    generalized-more back issues  . Atrial flutter ([place])   . BPH (benign prostatic hypertrophy)   . CHF (congestive heart failure) ([place])   . Chronic diastolic heart failure ([place]) 08/11/2013  . CKD (chronic kidney disease) stage 3, GFR 30-59 ml/min   . Depression   . Diverticulosis 2006  . GERD (gastroesophageal reflux disease)   . Hearing loss    wears bilateral hearing aids  . Hypertension   . Inguinal hernia    left side -surgery planned  . Lung nodules 2010  . Macular degeneration   . Nodule of right lung 05/24/2013  . Severe aortic stenosis 04/16/2013  . Shortness of breath   . Spinal stenosis     ROS:   All systems reviewed and negative except as noted in the HPI.   Past Surgical History:  Procedure Laterality Date  . AORTIC VALVE REPLACEMENT N/A 06/04/2013   Procedure: AORTIC VALVE REPLACEMENT (AVR);  Surgeon: Grace Isaac, MD;  Location: [facility];  Service:  Open Heart Surgery;  Laterality: N/A;  . APPENDECTOMY  1947  . CATARACT EXTRACTION, BILATERAL     bilateral  . CORONARY ARTERY BYPASS GRAFT N/A 06/04/2013   Procedure: CORONARY ARTERY BYPASS GRAFTING (CABG);  Surgeon: Grace Isaac, MD;  Location: [facility];  Service: Open Heart Surgery;  Laterality: N/A;  . HERNIA REPAIR  02/24/13   lap LIH repair  . INGUINAL HERNIA REPAIR Left 02/11/2013   Procedure: LEFT LAPAROSCOPIC REPAIR INGUINAL HERNIA WITH MESH;  Surgeon: Adin Hector, MD;  [facility];  Service: General;  Laterality: Left;  . INSERTION OF MESH Left 02/11/2013   Procedure: INSERTION OF MESH;  Surgeon: Adin Hector, MD;  [facility];  Service: General;  Laterality: Left;  . INTRAOPERATIVE TRANSESOPHAGEAL ECHOCARDIOGRAM N/A 06/04/2013    Procedure: INTRAOPERATIVE TRANSESOPHAGEAL ECHOCARDIOGRAM;  Surgeon: Grace Isaac, MD;  Location: [facility];  Service: Open Heart Surgery;  Laterality: N/A;  . NECK SURGERY  1997   compressed nerve  . SKIN BIOPSY  2003-2011   multiple   . TEE WITHOUT CARDIOVERSION N/A 01/23/2016   Procedure: TRANSESOPHAGEAL ECHOCARDIOGRAM (TEE);  Surgeon: Larey Dresser, MD;  Location: [facility];  Service: Cardiovascular;  Laterality: N/A;  . TONSILLECTOMY  1933  . WEDGE RESECTION Right 06/04/2013   Procedure: WEDGE RESECTION Right Lower Lobe;  Surgeon: Grace Isaac, MD;  Location: [facility];  Service: Open Heart Surgery;  Laterality: Right;     Family History  Problem Relation Age of Onset  . Cancer Mother     passed away age 82  . Prostate cancer Father   . Cancer Father   . Cancer Brother   . Hypertension Brother      Social History   Social History  . Marital status: Married    Spouse name: N/A  . Number of children: 1  . Years of education: N/A   Occupational History  . retired from [employer]    Social History Main Topics  . Smoking status: Former Smoker    Packs/day: 1.50    Years: 20.00    Types: Cigarettes    Quit date: 10/29/1967  . Smokeless tobacco: Never Used  . Alcohol use 0.0 oz/week     Comment: occasionally  . Drug use: No  . Sexual activity: Not Currently   Other Topics Concern  . Not on file   Social History Narrative  . No narrative on file     BP 132/64   Pulse (!) 58   Ht 5\' 8"  (1.727 m)   Wt 151 lb (68.5 kg)   SpO2 98%   BMI 22.96 kg/m   Physical Exam:  Elderly but Well appearing 82 yo man, NAD HEENT: Unremarkable Neck:  7 cm JVD, no thyromegally Lymphatics:  No adenopathy Back:  No CVA tenderness Lungs:  Clear with no wheezes HEART:  Regular rate rhythm, no murmurs, no rubs, no clicks Abd:  soft, positive bowel sounds, no organomegally, no rebound, no guarding Ext:  2 plus pulses, no edema, no cyanosis, no clubbing Skin:  No rashes no  nodules Neuro:  CN II through XII intact, motor grossly intact  Assess/Plan: 1. Atypical atrial flutter - he is asymptomatic and tolerating his atrial flutter and anti-coagulation. He will continue Eliquis. 2. Bradycardia - he has sinus node dysfunction but is asymptomatic. He will undergo watchful waiting 3. HTN - his blood pressure is controlled 4. Anxiety - he has been stressed about the possibility of moving but appears better with  the benzodiazipines. I have cautioned him about the addictive nature of benzo's.  Cristopher Peru

## 2016-08-30 NOTE — Patient Instructions (Signed)

## 2016-09-02 ENCOUNTER — Ambulatory Visit: Payer: Medicare Other | Admitting: Cardiology

## 2016-09-09 DIAGNOSIS — R278 Other lack of coordination: Secondary | ICD-10-CM | POA: Diagnosis not present

## 2016-09-09 DIAGNOSIS — R2689 Other abnormalities of gait and mobility: Secondary | ICD-10-CM | POA: Diagnosis not present

## 2016-09-09 DIAGNOSIS — M545 Low back pain: Secondary | ICD-10-CM | POA: Diagnosis not present

## 2016-09-09 DIAGNOSIS — M6281 Muscle weakness (generalized): Secondary | ICD-10-CM | POA: Diagnosis not present

## 2016-09-11 DIAGNOSIS — J309 Allergic rhinitis, unspecified: Secondary | ICD-10-CM | POA: Diagnosis not present

## 2016-09-11 DIAGNOSIS — R0981 Nasal congestion: Secondary | ICD-10-CM | POA: Diagnosis not present

## 2016-09-12 DIAGNOSIS — R2689 Other abnormalities of gait and mobility: Secondary | ICD-10-CM | POA: Diagnosis not present

## 2016-09-12 DIAGNOSIS — R278 Other lack of coordination: Secondary | ICD-10-CM | POA: Diagnosis not present

## 2016-09-12 DIAGNOSIS — M6281 Muscle weakness (generalized): Secondary | ICD-10-CM | POA: Diagnosis not present

## 2016-09-12 DIAGNOSIS — M545 Low back pain: Secondary | ICD-10-CM | POA: Diagnosis not present

## 2016-09-13 ENCOUNTER — Encounter: Attending: Urology | Primary: Family Medicine

## 2016-11-01 DIAGNOSIS — Z952 Presence of prosthetic heart valve: Secondary | ICD-10-CM | POA: Diagnosis not present

## 2016-11-01 DIAGNOSIS — H5203 Hypermetropia, bilateral: Secondary | ICD-10-CM | POA: Diagnosis not present

## 2016-11-01 DIAGNOSIS — I509 Heart failure, unspecified: Secondary | ICD-10-CM | POA: Diagnosis not present

## 2016-11-01 DIAGNOSIS — N400 Enlarged prostate without lower urinary tract symptoms: Secondary | ICD-10-CM | POA: Diagnosis not present

## 2016-11-01 DIAGNOSIS — H353133 Nonexudative age-related macular degeneration, bilateral, advanced atrophic without subfoveal involvement: Secondary | ICD-10-CM | POA: Diagnosis not present

## 2016-11-01 DIAGNOSIS — I25810 Atherosclerosis of coronary artery bypass graft(s) without angina pectoris: Secondary | ICD-10-CM | POA: Diagnosis not present

## 2016-11-01 DIAGNOSIS — H04123 Dry eye syndrome of bilateral lacrimal glands: Secondary | ICD-10-CM | POA: Diagnosis not present

## 2016-11-01 DIAGNOSIS — K219 Gastro-esophageal reflux disease without esophagitis: Secondary | ICD-10-CM | POA: Diagnosis not present

## 2016-11-01 DIAGNOSIS — I4892 Unspecified atrial flutter: Secondary | ICD-10-CM | POA: Diagnosis not present

## 2016-11-01 DIAGNOSIS — N183 Chronic kidney disease, stage 3 (moderate): Secondary | ICD-10-CM | POA: Diagnosis not present

## 2016-11-01 DIAGNOSIS — I10 Essential (primary) hypertension: Secondary | ICD-10-CM | POA: Diagnosis not present

## 2016-11-29 DIAGNOSIS — L309 Dermatitis, unspecified: Secondary | ICD-10-CM | POA: Diagnosis not present

## 2016-11-29 DIAGNOSIS — D1801 Hemangioma of skin and subcutaneous tissue: Secondary | ICD-10-CM | POA: Diagnosis not present

## 2016-11-29 DIAGNOSIS — L570 Actinic keratosis: Secondary | ICD-10-CM | POA: Diagnosis not present

## 2016-11-29 DIAGNOSIS — L821 Other seborrheic keratosis: Secondary | ICD-10-CM | POA: Diagnosis not present

## 2016-11-29 DIAGNOSIS — C4441 Basal cell carcinoma of skin of scalp and neck: Secondary | ICD-10-CM | POA: Diagnosis not present

## 2016-12-13 DIAGNOSIS — Z85828 Personal history of other malignant neoplasm of skin: Secondary | ICD-10-CM | POA: Diagnosis not present

## 2016-12-13 DIAGNOSIS — C4441 Basal cell carcinoma of skin of scalp and neck: Secondary | ICD-10-CM | POA: Diagnosis not present

## 2016-12-31 IMAGING — CR DG CHEST 2V
2 series · 2 of 2 positions shown · non-contrast
Comparison: Portable chest x-ray January 26, 2016

CLINICAL DATA: Two weeks of productive cough. History of previous
CABG and aortic valve replacement. The patient has undergone
previous wedge resection of a portion of the right lower lobe for
reportedly benign nodule. Remote history of smoking.

EXAM:
CHEST  2 VIEW

[w chest pa]
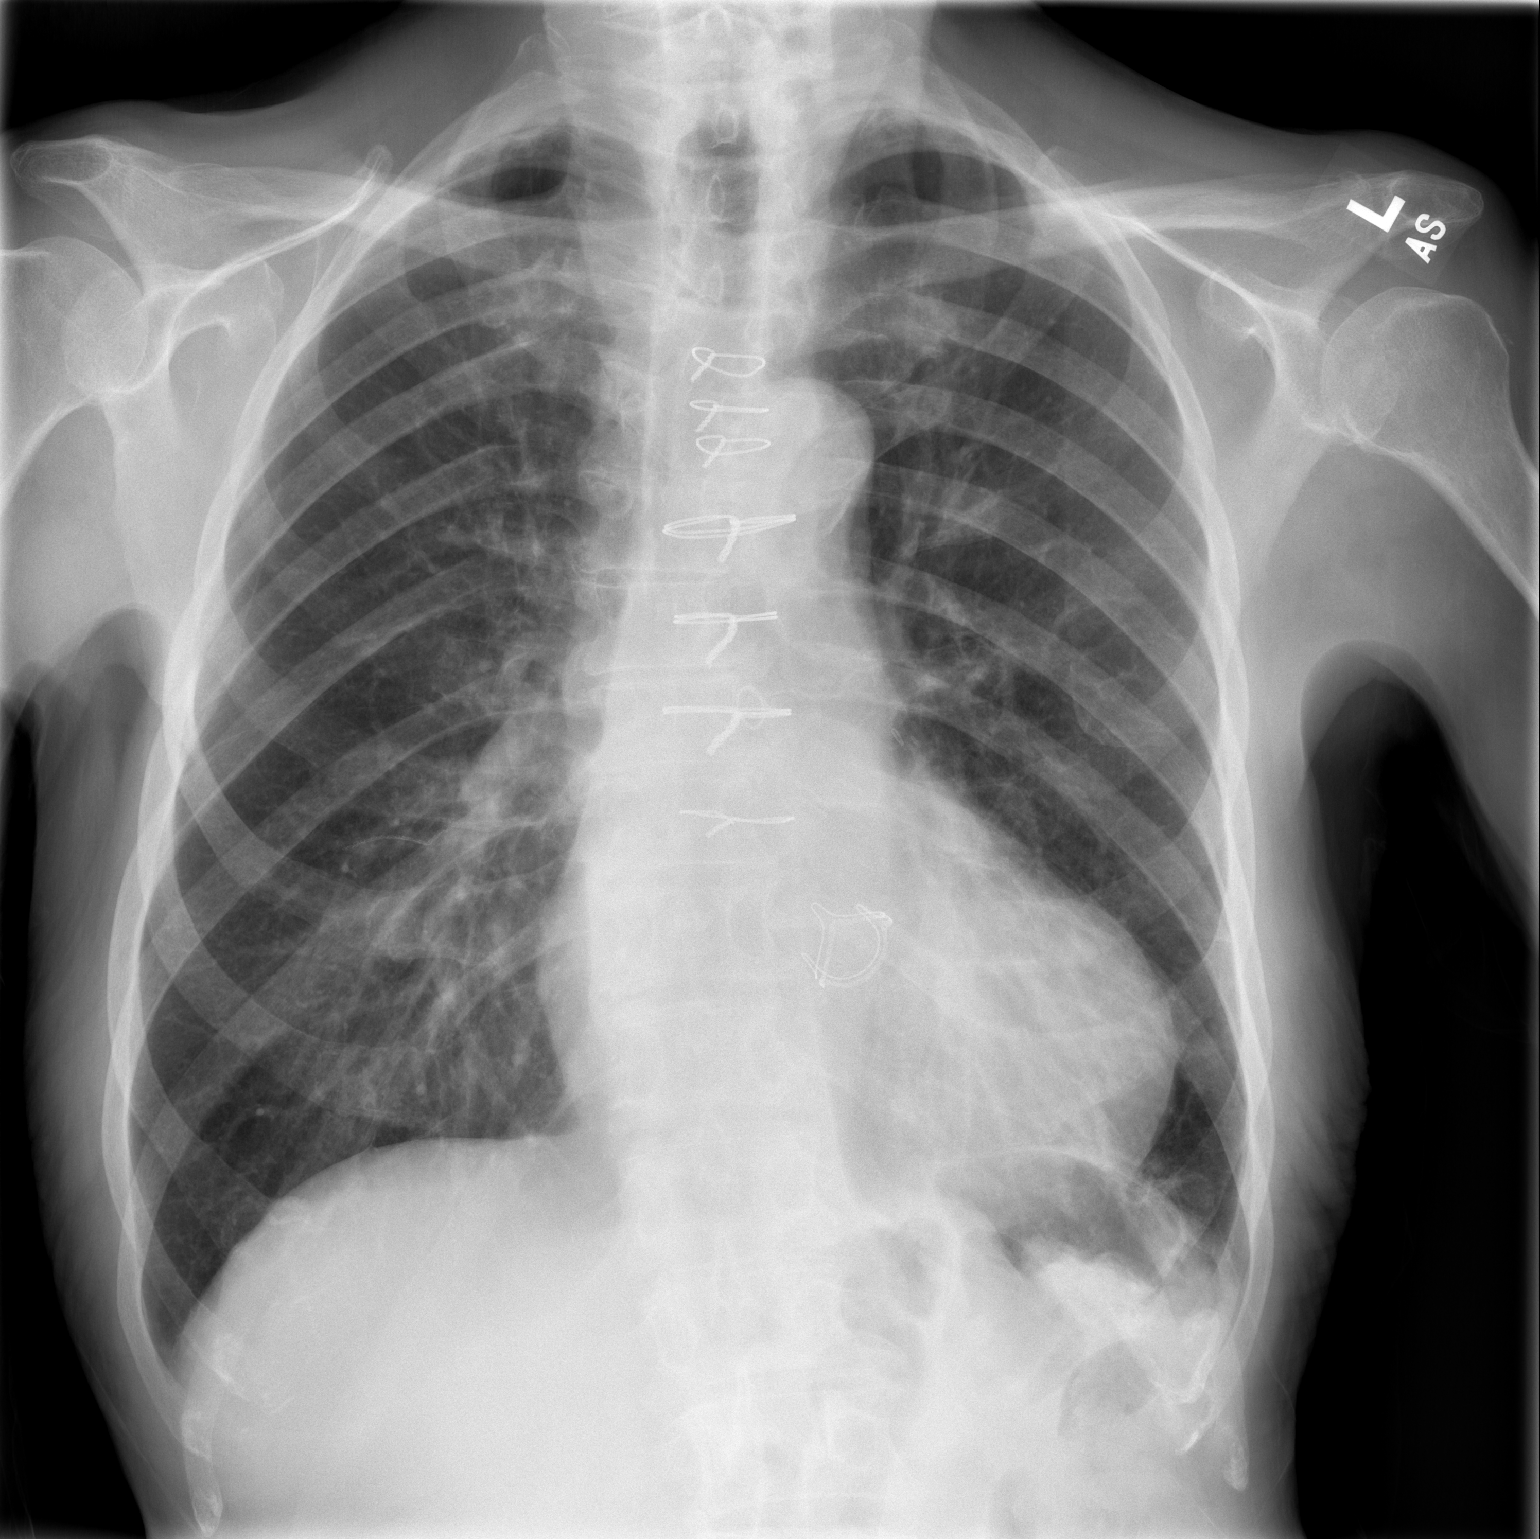

[w chest lat]
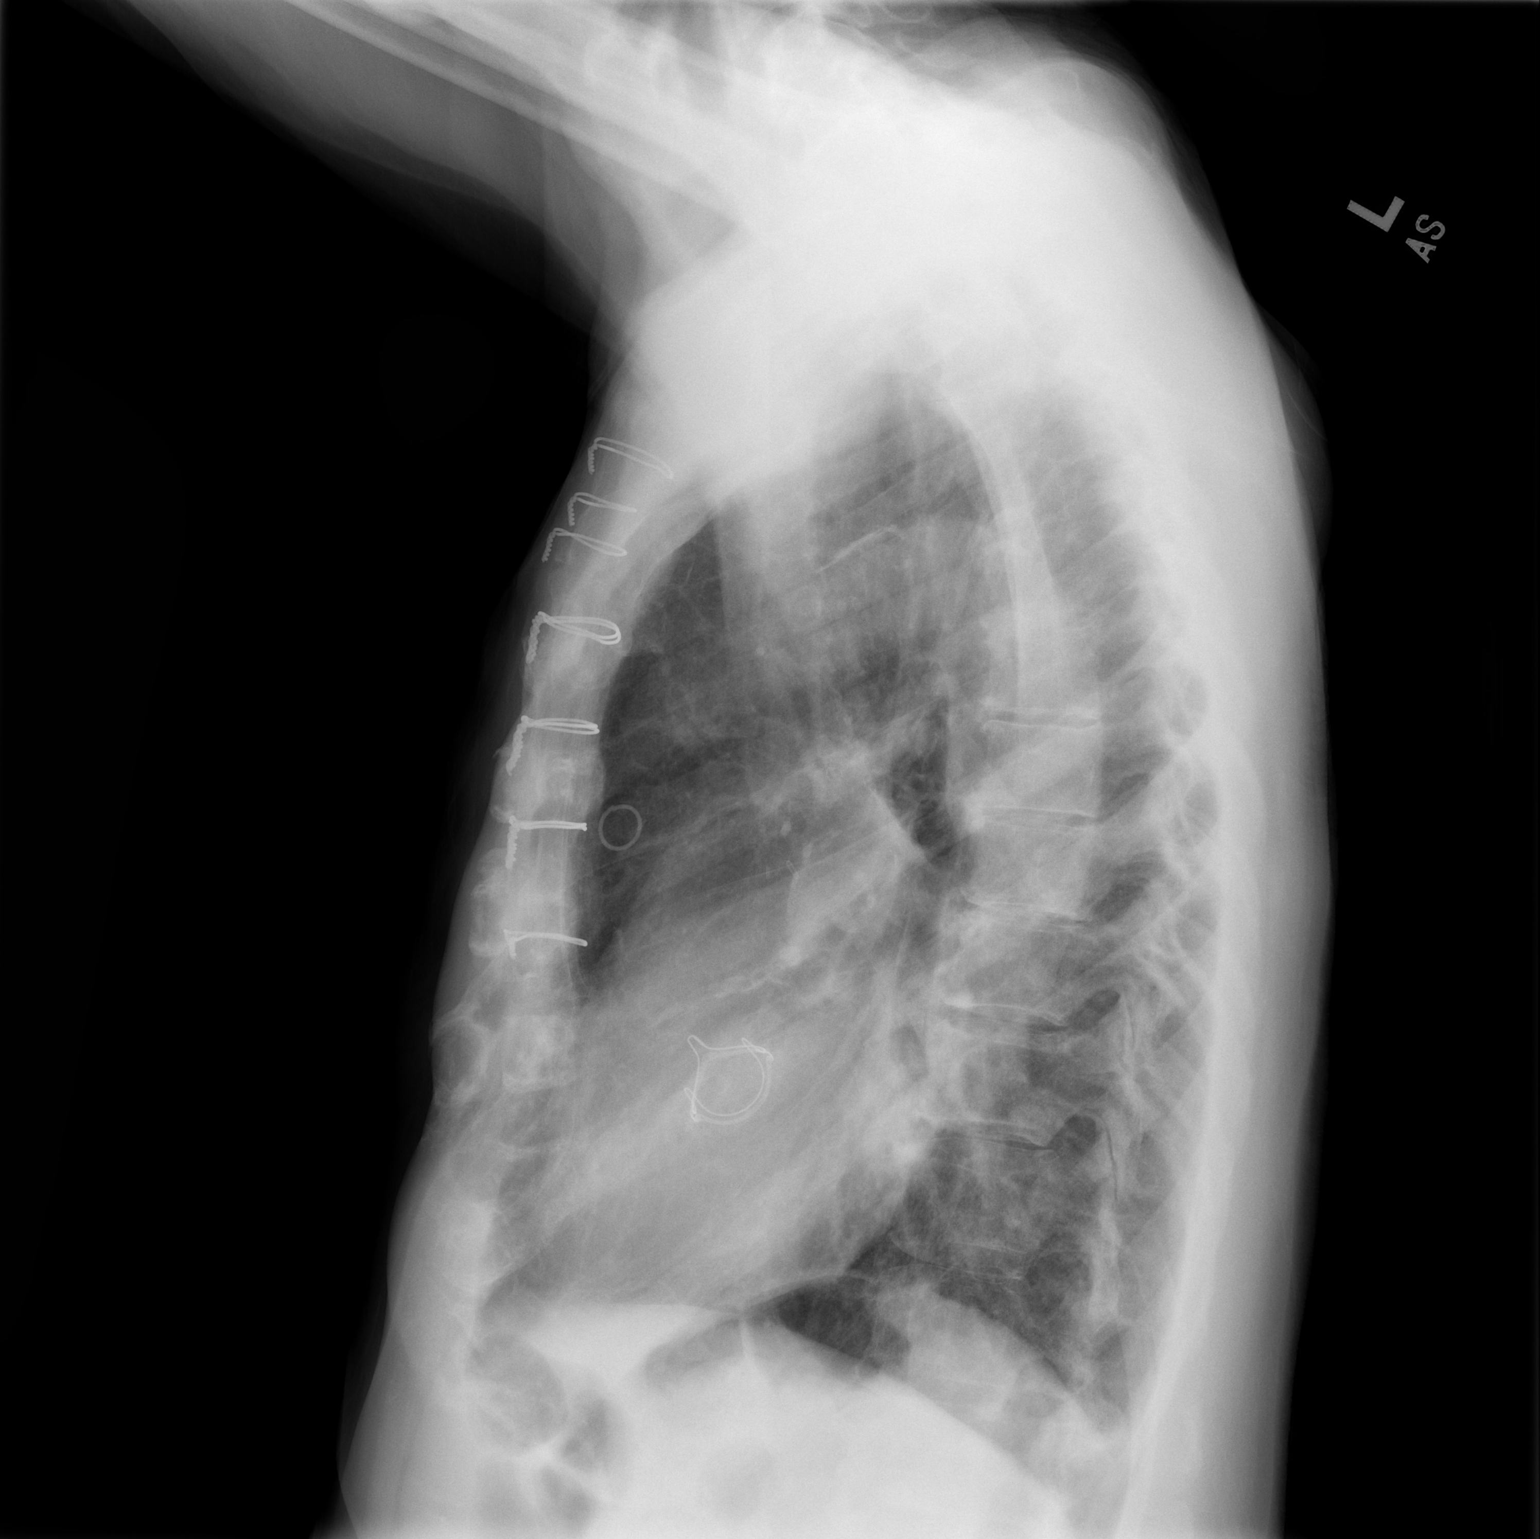

[2 of 2 positions shown; findings below may reference images not displayed]

FINDINGS: The lungs are mildly hyperinflated. The perihilar interstitial
markings are coarse. There is density in the left infrahilar region
posteriorly likely lying in the anterior aspect of the left lower
lobe. The heart is mildly enlarged. The pulmonary vascularity is
normal. There are post CABG and aortic valve replacement changes.
There is calcification in the wall of the aortic arch. There is no
pleural effusion. The bony thorax exhibits no acute abnormality.
IMPRESSION: Hyperinflation consistent with COPD or reactive airway disease.
Atelectasis or early pneumonia in the left lower lobe. No pulmonary
edema. Followup PA and lateral chest X-ray is recommended in 3-4
weeks following trial of antibiotic therapy to ensure resolution and
exclude underlying malignancy.

Aortic atherosclerosis.

## 2017-02-06 ENCOUNTER — Encounter: Payer: Self-pay | Admitting: Cardiology

## 2017-02-13 ENCOUNTER — Encounter: Payer: Self-pay | Admitting: *Deleted

## 2017-03-07 ENCOUNTER — Ambulatory Visit: Payer: Medicare Other | Admitting: Cardiology

## 2017-03-11 ENCOUNTER — Ambulatory Visit: Payer: Medicare Other | Admitting: Cardiology

## 2017-03-28 ENCOUNTER — Encounter (INDEPENDENT_AMBULATORY_CARE_PROVIDER_SITE_OTHER): Payer: Self-pay

## 2017-03-28 ENCOUNTER — Ambulatory Visit (INDEPENDENT_AMBULATORY_CARE_PROVIDER_SITE_OTHER): Payer: Medicare Other | Admitting: Cardiology

## 2017-03-28 ENCOUNTER — Encounter: Payer: Self-pay | Admitting: Cardiology

## 2017-03-28 VITALS — BP 132/82 | HR 62 | Ht 68.0 in | Wt 146.8 lb

## 2017-03-28 DIAGNOSIS — I440 Atrioventricular block, first degree: Secondary | ICD-10-CM | POA: Diagnosis not present

## 2017-03-28 DIAGNOSIS — Z952 Presence of prosthetic heart valve: Secondary | ICD-10-CM

## 2017-03-28 DIAGNOSIS — I5032 Chronic diastolic (congestive) heart failure: Secondary | ICD-10-CM | POA: Diagnosis not present

## 2017-03-28 DIAGNOSIS — N183 Chronic kidney disease, stage 3 (moderate): Secondary | ICD-10-CM | POA: Diagnosis not present

## 2017-03-28 DIAGNOSIS — J309 Allergic rhinitis, unspecified: Secondary | ICD-10-CM | POA: Diagnosis not present

## 2017-03-28 DIAGNOSIS — F419 Anxiety disorder, unspecified: Secondary | ICD-10-CM | POA: Diagnosis not present

## 2017-03-28 DIAGNOSIS — I2510 Atherosclerotic heart disease of native coronary artery without angina pectoris: Secondary | ICD-10-CM | POA: Diagnosis not present

## 2017-03-28 DIAGNOSIS — I10 Essential (primary) hypertension: Secondary | ICD-10-CM | POA: Diagnosis not present

## 2017-03-28 DIAGNOSIS — Z951 Presence of aortocoronary bypass graft: Secondary | ICD-10-CM | POA: Diagnosis not present

## 2017-03-28 DIAGNOSIS — R05 Cough: Secondary | ICD-10-CM | POA: Diagnosis not present

## 2017-03-28 DIAGNOSIS — I4892 Unspecified atrial flutter: Secondary | ICD-10-CM | POA: Diagnosis not present

## 2017-03-28 NOTE — Patient Instructions (Signed)

## 2017-03-28 NOTE — Progress Notes (Signed)
[facility]. [address], [place]  [address] Phone: [phone] Fax:  [phone]  Date:  03/28/2017   ID:  Hanley Seamen, DOB 28-Apr-1924, MRN [id number]  PCP:  Wenda Low, MD   History of Present Illness: Joseph Garner is a 83 y.o. male with bioprosthetic aortic valve replacement 06/04/13, CBAG x 1 with postoperative readmission to hospital secondary to acute diastolic heart failure, pneumonitis with cessation of amiodarone here for followup. He had prolonged stay at [facility] rehabilitation.  He was hospitalized in March 2017 for 12 days with severe muscle spasms. He was quite debilitated when he left the hospital, in a wheelchair. He underwent extensive physical therapy and actually stayed in [place] with his only daughter. He enjoyed the physiotherapist there who accommodated him well. Now that he is back in [place], he is undergoing physical therapy but thinks that they are working him too hard. He does not enjoy the weight lifting on his legs. Has been taking citalopram for anxiety/depression   Had CABG x1, diagonal branch.   First-degree heart block noted. Appreciate Dr. Tanna Furry consultation. Does not require pacemaker at this time.  03/28/17  -Recently had upper respiratory infection, Zpac, Flonase. Dr. Lysle Rubens. Right hernia came back.  Overall feels well. Has been exercising. Uses light weights. No bleeding on anticoagulation, no chest pain, no syncope.   Wt Readings from Last 3 Encounters:  03/28/17 146 lb 12.8 oz (66.6 kg)  08/30/16 151 lb (68.5 kg)  07/26/16 151 lb 6.4 oz (68.7 kg)     Past Medical History:  Diagnosis Date  . Actinic keratosis, hx of   . Allergy    allergic rhinitis  . Anemia   . Arthritis    generalized-more back issues  . Atrial flutter ([place])   . BPH (benign prostatic hypertrophy)   . CHF (congestive heart failure) ([place])   . Chronic diastolic heart failure ([place]) 08/11/2013  . CKD (chronic kidney disease) stage 3, GFR 30-59  ml/min   . Depression   . Diverticulosis 2006  . GERD (gastroesophageal reflux disease)   . Hearing loss    wears bilateral hearing aids  . Hypertension   . Inguinal hernia    left side -surgery planned  . Lung nodules 2010  . Macular degeneration   . Nodule of right lung 05/24/2013  . Severe aortic stenosis 04/16/2013  . Shortness of breath   . Spinal stenosis     Past Surgical History:  Procedure Laterality Date  . AORTIC VALVE REPLACEMENT N/A 06/04/2013   Procedure: AORTIC VALVE REPLACEMENT (AVR);  Surgeon: Grace Isaac, MD;  Location: [facility];  Service: Open Heart Surgery;  Laterality: N/A;  . APPENDECTOMY  1947  . CATARACT EXTRACTION, BILATERAL     bilateral  . CORONARY ARTERY BYPASS GRAFT N/A 06/04/2013   Procedure: CORONARY ARTERY BYPASS GRAFTING (CABG);  Surgeon: Grace Isaac, MD;  Location: [facility];  Service: Open Heart Surgery;  Laterality: N/A;  . HERNIA REPAIR  02/24/13   lap LIH repair  . INGUINAL HERNIA REPAIR Left 02/11/2013   Procedure: LEFT LAPAROSCOPIC REPAIR INGUINAL HERNIA WITH MESH;  Surgeon: Adin Hector, MD;  [facility];  Service: General;  Laterality: Left;  . INSERTION OF MESH Left 02/11/2013   Procedure: INSERTION OF MESH;  Surgeon: Adin Hector, MD;  [facility];  Service: General;  Laterality: Left;  . INTRAOPERATIVE TRANSESOPHAGEAL ECHOCARDIOGRAM N/A 06/04/2013   Procedure: INTRAOPERATIVE TRANSESOPHAGEAL ECHOCARDIOGRAM;  Surgeon:  Grace Isaac, MD;  Location: [facility];  Service: Open Heart Surgery;  Laterality: N/A;  . NECK SURGERY  1997   compressed nerve  . SKIN BIOPSY  2003-2011   multiple   . TEE WITHOUT CARDIOVERSION N/A 01/23/2016   Procedure: TRANSESOPHAGEAL ECHOCARDIOGRAM (TEE);  Surgeon: Larey Dresser, MD;  Location: [facility];  Service: Cardiovascular;  Laterality: N/A;  . TONSILLECTOMY  1933  . WEDGE RESECTION Right 06/04/2013   Procedure: WEDGE RESECTION Right Lower Lobe;  Surgeon: Grace Isaac, MD;  Location: [facility];  Service: Open Heart Surgery;  Laterality: Right;    Current Outpatient Prescriptions:  .  amoxicillin (AMOXIL) 500 MG capsule, 4 capsules one time as needed for dental procedure, Disp: , Rfl:  .  apixaban (ELIQUIS) 2.5 MG TABS tablet, Take 1 tablet (2.5 mg total) by mouth 2 (two) times daily., Disp: 60 tablet, Rfl: 11 .  Azelastine HCl 0.15 % SOLN, Place 1 spray into the nose 2 (two) times daily., Disp: , Rfl:  .  citalopram (CELEXA) 10 MG tablet, Take 10 mg by mouth daily., Disp: , Rfl:  .  finasteride (PROSCAR) 5 MG tablet, Take 1 tablet (5 mg total) by mouth daily., Disp: 30 tablet, Rfl: 0 .  fluticasone (FLONASE) 50 MCG/ACT nasal spray, Place 2 sprays into the nose daily as needed for allergies. , Disp: , Rfl:  .  folic acid (FOLVITE) 1 MG tablet, Take 1 tablet (1 mg total) by mouth daily., Disp: , Rfl:  .  furosemide (LASIX) 40 MG tablet, Take 0.5 tablets (20 mg total) by mouth as needed for fluid or edema (Patient takes 1/2 pill)., Disp: 15 tablet, Rfl: 9 .  loratadine (CLARITIN) 10 MG tablet, Take 10 mg by mouth daily as needed for allergies. , Disp: , Rfl:  .  magnesium oxide (MAG-OX) 400 MG tablet, Take 400 mg by mouth daily., Disp: , Rfl:  .  Multiple Vitamin (MULTIVITAMIN WITH MINERALS) TABS, Take 1 tablet by mouth daily., Disp: , Rfl:  .  Multiple Vitamins-Minerals (EYE VITAMINS PO), Take 1 tablet by mouth daily. Perservision Vitamin, Disp: , Rfl:  .  omeprazole (PRILOSEC) 20 MG capsule, Take 20 mg by mouth daily., Disp: , Rfl:  .  tamsulosin (FLOMAX) 0.4 MG CAPS capsule, Take 0.4 mg by mouth daily. , Disp: , Rfl:     Allergies:   No Known Allergies  Social History:  The patient  reports that he quit smoking about 49 years ago. His smoking use included Cigarettes. He has a 30.00 pack-year smoking history. He has never used smokeless tobacco. He reports that he drinks alcohol. He reports that he does not use drugs.   ROS:  Please see the history of present illness.   Denies  any chest pain, syncope, fevers, orthopnea, PND.   All other systems reviewed and negative. Occasional flank pain/musculoskeletal discomfort.  PHYSICAL EXAM: VS:  BP 132/82   Pulse 62   Ht 5\' 8"  (1.727 m)   Wt 146 lb 12.8 oz (66.6 kg)   BMI 22.32 kg/m  GEN: Well nourished, well developed, in no acute distress  HEENT: normal  Neck: no JVD, carotid bruits, or masses Cardiac: RRR; 1/6 SM, no rubs, or gallops,no edema  Respiratory:  clear to auscultation bilaterally, normal work of breathing GI: soft, nontender, nondistended, + BS MS: no deformity or atrophy  Skin: warm and dry, no rash Neuro:  Alert and Oriented x 3, Strength and sensation are intact Psych: euthymic mood, full  affect   Echocardiogram 06/21/13-normal functioning bioprosthetic valve. Normal EF.  EKG:  EKG ordered today 07/26/16-atrial flutter with variable conduction, 4-1, heart rate 52 bpm.  -06/23/15-sinus rhythm, first-degree AV block, 424 ms. 06/10/14-sinus rhythm, nonspecific ST-T wave change, first degree AV block, 338 ms. No longer right bundle branch block    Holter 09/25/15:  15000 PVC's, 4000 PAC's  Ventricular bigeminy/trigeminy/rare couplet  Brief PAT (parox atrial tach)  No AFIB  2.1 sec R-R during sleep. No pauses greater than 3 sec  First degree AVB at baseline  No significant bradycardia   Reviewed Dr. Lovena Le EP note. No pacemaker at this time. Anticoagulation started. ECG 09/07/15 - atrial flutter.     ASSESSMENT AND PLAN:   1. Aortic stenosis/bioprosthetic aortic valve replacement-long rehabilitation efforts at [facility] following surgery.   Echocardiogram reviewed. Valve is functioning well. Continue with dental antibiotic prophylaxis. No changes. 2. Atrial flutter, asymptomatic. On anticoagulation, Eliquis 2.5.BID. Stable. 3. Diastolic heart failure-currently well compensated.  Dilated atria noted on echocardiogram with normal ejection fraction. Appears to be doing quite well. No  significant edema currently. 4. CAD-bypass times one, diagonal branch. ASA.  Lipitor. No changes. 5.  First-degree AV block- . No symptoms, no syncope. During physical therapy, heart rate dropped into the 40s. If heart rate decreases more significantly, he may require pacemaker. We discussed. Appreciate Dr. Tanna Furry consultation. No need for pacemaker at this time. Continue to monitor clinically. 6. We will see him back in 12 months. He seeing Dr. Lovena Le in about 6 months.  Signed, Candee Furbish, MD [facility]  03/28/2017 2:07 PM

## 2017-05-15 DIAGNOSIS — H353211 Exudative age-related macular degeneration, right eye, with active choroidal neovascularization: Secondary | ICD-10-CM | POA: Diagnosis not present

## 2017-05-16 ENCOUNTER — Telehealth: Payer: Self-pay | Admitting: Internal Medicine

## 2017-05-16 NOTE — Telephone Encounter (Signed)
New message     Pt daughter is calling about pt. She states he is having trouble with his eye and the treatment is having injections in his eye. She wants to make sure it is ok with his medication.

## 2017-05-16 NOTE — Telephone Encounter (Signed)
Called patient's daughter (DPR) back about her message. She wanted to make Dr. Lovena Le and Dr. Marlou Porch aware that patient is going to have a series of injections of Avastin for Macular Degeneration. Patient's eye sight has become worse and they will start injections on Monday. Patient's daughter stated that patient does not need to stop his eliquis. Informed patient's daughter if there is a need for clearance to have the ophthalmologist to fax our office a clearance. Patient's daughter verbalized understanding.

## 2017-05-18 NOTE — Telephone Encounter (Signed)
I am not sure why we have been notified if they do not need to stop Pradaxa and do not need preoperative clearance. Nothing further to add. GT

## 2017-05-20 DIAGNOSIS — H353211 Exudative age-related macular degeneration, right eye, with active choroidal neovascularization: Secondary | ICD-10-CM | POA: Diagnosis not present

## 2017-05-30 DIAGNOSIS — L821 Other seborrheic keratosis: Secondary | ICD-10-CM | POA: Diagnosis not present

## 2017-05-30 DIAGNOSIS — D485 Neoplasm of uncertain behavior of skin: Secondary | ICD-10-CM | POA: Diagnosis not present

## 2017-05-30 DIAGNOSIS — L309 Dermatitis, unspecified: Secondary | ICD-10-CM | POA: Diagnosis not present

## 2017-05-30 DIAGNOSIS — D1801 Hemangioma of skin and subcutaneous tissue: Secondary | ICD-10-CM | POA: Diagnosis not present

## 2017-05-30 DIAGNOSIS — D225 Melanocytic nevi of trunk: Secondary | ICD-10-CM | POA: Diagnosis not present

## 2017-05-30 DIAGNOSIS — L570 Actinic keratosis: Secondary | ICD-10-CM | POA: Diagnosis not present

## 2017-05-30 DIAGNOSIS — D692 Other nonthrombocytopenic purpura: Secondary | ICD-10-CM | POA: Diagnosis not present

## 2017-05-30 DIAGNOSIS — Z85828 Personal history of other malignant neoplasm of skin: Secondary | ICD-10-CM | POA: Diagnosis not present

## 2017-06-05 ENCOUNTER — Telehealth: Payer: Self-pay | Admitting: Cardiology

## 2017-06-05 NOTE — Telephone Encounter (Signed)
New message    Pt daughter is calling asking for call back. She states that pt is complaining of numbness in his left leg. Please call.

## 2017-06-05 NOTE — Telephone Encounter (Signed)
Daughter calling and states that her father has been having difficulty walking over the past week or so. She states that he told her that there has been some intermittent numbness in his left leg. She states that he is not having any SOB, pain, or swelling in his his legs. She states that he is not having any other symptoms. She states that the patient has a history of muscle spasms in his lower extremities and lower back pain. Advised that the patient follow up with PCP and let us know if his symptoms worsened or changed. Daughter verbalized understanding and thanked me for the call.

## 2017-06-09 DIAGNOSIS — M4807 Spinal stenosis, lumbosacral region: Secondary | ICD-10-CM | POA: Diagnosis not present

## 2017-06-09 DIAGNOSIS — R200 Anesthesia of skin: Secondary | ICD-10-CM | POA: Diagnosis not present

## 2017-06-20 DIAGNOSIS — H353211 Exudative age-related macular degeneration, right eye, with active choroidal neovascularization: Secondary | ICD-10-CM | POA: Diagnosis not present

## 2017-06-23 DIAGNOSIS — M48061 Spinal stenosis, lumbar region without neurogenic claudication: Secondary | ICD-10-CM | POA: Diagnosis not present

## 2017-06-25 DIAGNOSIS — R351 Nocturia: Secondary | ICD-10-CM | POA: Diagnosis not present

## 2017-06-25 DIAGNOSIS — N401 Enlarged prostate with lower urinary tract symptoms: Secondary | ICD-10-CM | POA: Diagnosis not present

## 2017-07-18 DIAGNOSIS — H353211 Exudative age-related macular degeneration, right eye, with active choroidal neovascularization: Secondary | ICD-10-CM | POA: Diagnosis not present

## 2017-08-22 DIAGNOSIS — H353211 Exudative age-related macular degeneration, right eye, with active choroidal neovascularization: Secondary | ICD-10-CM | POA: Diagnosis not present

## 2017-08-27 ENCOUNTER — Telehealth: Payer: Self-pay | Admitting: Internal Medicine

## 2017-08-27 DIAGNOSIS — I10 Essential (primary) hypertension: Secondary | ICD-10-CM | POA: Diagnosis not present

## 2017-08-27 DIAGNOSIS — I2510 Atherosclerotic heart disease of native coronary artery without angina pectoris: Secondary | ICD-10-CM | POA: Diagnosis not present

## 2017-08-27 DIAGNOSIS — Z952 Presence of prosthetic heart valve: Secondary | ICD-10-CM | POA: Diagnosis not present

## 2017-08-27 DIAGNOSIS — K219 Gastro-esophageal reflux disease without esophagitis: Secondary | ICD-10-CM | POA: Diagnosis not present

## 2017-08-27 DIAGNOSIS — I509 Heart failure, unspecified: Secondary | ICD-10-CM | POA: Diagnosis not present

## 2017-08-27 DIAGNOSIS — I471 Supraventricular tachycardia: Secondary | ICD-10-CM | POA: Diagnosis not present

## 2017-08-27 DIAGNOSIS — N400 Enlarged prostate without lower urinary tract symptoms: Secondary | ICD-10-CM | POA: Diagnosis not present

## 2017-08-27 DIAGNOSIS — D649 Anemia, unspecified: Secondary | ICD-10-CM | POA: Diagnosis not present

## 2017-08-27 DIAGNOSIS — N183 Chronic kidney disease, stage 3 (moderate): Secondary | ICD-10-CM | POA: Diagnosis not present

## 2017-08-27 DIAGNOSIS — Z0000 Encounter for general adult medical examination without abnormal findings: Secondary | ICD-10-CM | POA: Diagnosis not present

## 2017-08-27 DIAGNOSIS — Z23 Encounter for immunization: Secondary | ICD-10-CM | POA: Diagnosis not present

## 2017-08-27 DIAGNOSIS — I4892 Unspecified atrial flutter: Secondary | ICD-10-CM | POA: Diagnosis not present

## 2017-08-27 DIAGNOSIS — H3530 Unspecified macular degeneration: Secondary | ICD-10-CM | POA: Diagnosis not present

## 2017-08-27 DIAGNOSIS — F322 Major depressive disorder, single episode, severe without psychotic features: Secondary | ICD-10-CM | POA: Diagnosis not present

## 2017-08-27 DIAGNOSIS — Z1389 Encounter for screening for other disorder: Secondary | ICD-10-CM | POA: Diagnosis not present

## 2017-08-27 NOTE — Telephone Encounter (Signed)
New message     Patient daughter walked in this afternoon asking for a call from the nurse for Dr. Marlou Porch and Dr. Lovena Le. She states that pt is moving to [place] area and she is wondering about a referral for pt. Please call.

## 2017-08-29 NOTE — Telephone Encounter (Signed)
Call returned to Pt daughter.  Notified daughter that Dr. Lovena Le was not sure who to refer Pt to, but if daughter found a doctor to notify us and we would refer him to that doctor.  Daughter indicates understanding.  Will notify us when she finds a doctor, states PCP knows doctors in that area.

## 2017-09-05 NOTE — Telephone Encounter (Signed)
Daughter aware to notify Dr. Lovena Le when referral needed.  Will await further needs.

## 2017-09-11 ENCOUNTER — Telehealth: Payer: Self-pay | Admitting: Internal Medicine

## 2017-09-11 NOTE — Telephone Encounter (Signed)
Call returned to daughter.  Notified Pt with appt with requested doctor 09/22/2017.  Records being faxed.  Appt with Dr. Lovena Le in December cancelled.  No further needs.

## 2017-09-11 NOTE — Telephone Encounter (Signed)
New Message  Pt daughter call requesting to speak with RN to get a referral for pt to be seen by EP provider in [place]. Pt daughter states pt has moved and needs a new EP Doctor.  Preferred Doctor.. Dr. Jake Samples [address] [place], [place] [address] [phone]

## 2017-09-26 DIAGNOSIS — Z953 Presence of xenogenic heart valve: Secondary | ICD-10-CM | POA: Diagnosis not present

## 2017-09-26 DIAGNOSIS — I4892 Unspecified atrial flutter: Secondary | ICD-10-CM | POA: Diagnosis not present

## 2017-09-30 DIAGNOSIS — H353132 Nonexudative age-related macular degeneration, bilateral, intermediate dry stage: Secondary | ICD-10-CM | POA: Diagnosis not present

## 2017-09-30 DIAGNOSIS — H353231 Exudative age-related macular degeneration, bilateral, with active choroidal neovascularization: Secondary | ICD-10-CM | POA: Diagnosis not present

## 2017-09-30 DIAGNOSIS — H04123 Dry eye syndrome of bilateral lacrimal glands: Secondary | ICD-10-CM | POA: Diagnosis not present

## 2017-10-06 DIAGNOSIS — I4892 Unspecified atrial flutter: Secondary | ICD-10-CM | POA: Diagnosis not present

## 2017-10-10 ENCOUNTER — Ambulatory Visit: Payer: Medicare Other | Admitting: Internal Medicine

## 2017-10-10 DIAGNOSIS — B351 Tinea unguium: Secondary | ICD-10-CM | POA: Diagnosis not present

## 2017-10-10 DIAGNOSIS — M79675 Pain in left toe(s): Secondary | ICD-10-CM | POA: Diagnosis not present

## 2017-10-10 DIAGNOSIS — L600 Ingrowing nail: Secondary | ICD-10-CM | POA: Diagnosis not present

## 2017-11-05 DIAGNOSIS — H353231 Exudative age-related macular degeneration, bilateral, with active choroidal neovascularization: Secondary | ICD-10-CM | POA: Diagnosis not present

## 2017-11-05 DIAGNOSIS — H353132 Nonexudative age-related macular degeneration, bilateral, intermediate dry stage: Secondary | ICD-10-CM | POA: Diagnosis not present

## 2017-11-05 DIAGNOSIS — H04123 Dry eye syndrome of bilateral lacrimal glands: Secondary | ICD-10-CM | POA: Diagnosis not present

## 2017-11-12 DIAGNOSIS — N189 Chronic kidney disease, unspecified: Secondary | ICD-10-CM | POA: Diagnosis not present

## 2017-11-12 DIAGNOSIS — Z952 Presence of prosthetic heart valve: Secondary | ICD-10-CM | POA: Diagnosis not present

## 2017-11-12 DIAGNOSIS — I10 Essential (primary) hypertension: Secondary | ICD-10-CM | POA: Diagnosis not present

## 2017-11-12 DIAGNOSIS — I2510 Atherosclerotic heart disease of native coronary artery without angina pectoris: Secondary | ICD-10-CM | POA: Diagnosis not present

## 2017-11-12 DIAGNOSIS — L989 Disorder of the skin and subcutaneous tissue, unspecified: Secondary | ICD-10-CM | POA: Diagnosis not present

## 2017-11-26 DIAGNOSIS — Z85828 Personal history of other malignant neoplasm of skin: Secondary | ICD-10-CM | POA: Diagnosis not present

## 2017-11-26 DIAGNOSIS — L570 Actinic keratosis: Secondary | ICD-10-CM | POA: Diagnosis not present

## 2017-11-26 DIAGNOSIS — L309 Dermatitis, unspecified: Secondary | ICD-10-CM | POA: Diagnosis not present

## 2017-11-26 DIAGNOSIS — L918 Other hypertrophic disorders of the skin: Secondary | ICD-10-CM | POA: Diagnosis not present

## 2017-12-08 DIAGNOSIS — J209 Acute bronchitis, unspecified: Secondary | ICD-10-CM | POA: Diagnosis not present

## 2017-12-16 DIAGNOSIS — H04123 Dry eye syndrome of bilateral lacrimal glands: Secondary | ICD-10-CM | POA: Diagnosis not present

## 2017-12-16 DIAGNOSIS — H353211 Exudative age-related macular degeneration, right eye, with active choroidal neovascularization: Secondary | ICD-10-CM | POA: Diagnosis not present

## 2017-12-16 DIAGNOSIS — H353231 Exudative age-related macular degeneration, bilateral, with active choroidal neovascularization: Secondary | ICD-10-CM | POA: Diagnosis not present

## 2017-12-16 DIAGNOSIS — H353132 Nonexudative age-related macular degeneration, bilateral, intermediate dry stage: Secondary | ICD-10-CM | POA: Diagnosis not present

## 2018-01-07 DIAGNOSIS — I10 Essential (primary) hypertension: Secondary | ICD-10-CM | POA: Diagnosis not present

## 2018-01-07 DIAGNOSIS — R600 Localized edema: Secondary | ICD-10-CM | POA: Diagnosis not present

## 2018-01-13 DIAGNOSIS — H353231 Exudative age-related macular degeneration, bilateral, with active choroidal neovascularization: Secondary | ICD-10-CM | POA: Diagnosis not present

## 2018-01-13 DIAGNOSIS — H353211 Exudative age-related macular degeneration, right eye, with active choroidal neovascularization: Secondary | ICD-10-CM | POA: Diagnosis not present

## 2018-01-16 DIAGNOSIS — H6123 Impacted cerumen, bilateral: Secondary | ICD-10-CM | POA: Diagnosis not present

## 2018-01-28 DIAGNOSIS — I8310 Varicose veins of unspecified lower extremity with inflammation: Secondary | ICD-10-CM | POA: Diagnosis not present

## 2018-01-28 DIAGNOSIS — L570 Actinic keratosis: Secondary | ICD-10-CM | POA: Diagnosis not present

## 2018-01-28 DIAGNOSIS — Z85828 Personal history of other malignant neoplasm of skin: Secondary | ICD-10-CM | POA: Diagnosis not present

## 2018-02-19 DIAGNOSIS — H353211 Exudative age-related macular degeneration, right eye, with active choroidal neovascularization: Secondary | ICD-10-CM | POA: Diagnosis not present

## 2018-02-19 DIAGNOSIS — H353122 Nonexudative age-related macular degeneration, left eye, intermediate dry stage: Secondary | ICD-10-CM | POA: Diagnosis not present

## 2018-02-19 DIAGNOSIS — Z961 Presence of intraocular lens: Secondary | ICD-10-CM | POA: Diagnosis not present

## 2018-03-25 DIAGNOSIS — I10 Essential (primary) hypertension: Secondary | ICD-10-CM | POA: Diagnosis not present

## 2018-03-25 DIAGNOSIS — E785 Hyperlipidemia, unspecified: Secondary | ICD-10-CM | POA: Diagnosis not present

## 2018-03-25 DIAGNOSIS — I350 Nonrheumatic aortic (valve) stenosis: Secondary | ICD-10-CM | POA: Diagnosis not present

## 2018-03-25 DIAGNOSIS — I4892 Unspecified atrial flutter: Secondary | ICD-10-CM | POA: Diagnosis not present

## 2018-03-25 DIAGNOSIS — R079 Chest pain, unspecified: Secondary | ICD-10-CM | POA: Diagnosis not present

## 2018-03-25 DIAGNOSIS — Z953 Presence of xenogenic heart valve: Secondary | ICD-10-CM | POA: Diagnosis not present

## 2018-04-09 DIAGNOSIS — H353211 Exudative age-related macular degeneration, right eye, with active choroidal neovascularization: Secondary | ICD-10-CM | POA: Diagnosis not present

## 2018-04-09 DIAGNOSIS — H353122 Nonexudative age-related macular degeneration, left eye, intermediate dry stage: Secondary | ICD-10-CM | POA: Diagnosis not present

## 2018-05-12 DIAGNOSIS — I10 Essential (primary) hypertension: Secondary | ICD-10-CM | POA: Diagnosis not present

## 2018-05-12 DIAGNOSIS — N189 Chronic kidney disease, unspecified: Secondary | ICD-10-CM | POA: Diagnosis not present

## 2018-06-02 DIAGNOSIS — H353122 Nonexudative age-related macular degeneration, left eye, intermediate dry stage: Secondary | ICD-10-CM | POA: Diagnosis not present

## 2018-06-02 DIAGNOSIS — H353211 Exudative age-related macular degeneration, right eye, with active choroidal neovascularization: Secondary | ICD-10-CM | POA: Diagnosis not present

## 2018-06-02 DIAGNOSIS — H04123 Dry eye syndrome of bilateral lacrimal glands: Secondary | ICD-10-CM | POA: Diagnosis not present

## 2018-07-17 DIAGNOSIS — I10 Essential (primary) hypertension: Secondary | ICD-10-CM | POA: Diagnosis not present

## 2018-07-17 DIAGNOSIS — R609 Edema, unspecified: Secondary | ICD-10-CM | POA: Diagnosis not present

## 2018-07-17 LAB — AMB EXT CREATININE
Creatinine, External: 1.4
Creatinine, External: 1.4 NA

## 2018-07-21 DIAGNOSIS — H04123 Dry eye syndrome of bilateral lacrimal glands: Secondary | ICD-10-CM | POA: Diagnosis not present

## 2018-07-21 DIAGNOSIS — H53122 Transient visual loss, left eye: Secondary | ICD-10-CM | POA: Diagnosis not present

## 2018-07-21 DIAGNOSIS — H353211 Exudative age-related macular degeneration, right eye, with active choroidal neovascularization: Secondary | ICD-10-CM | POA: Diagnosis not present

## 2018-07-21 DIAGNOSIS — H353122 Nonexudative age-related macular degeneration, left eye, intermediate dry stage: Secondary | ICD-10-CM | POA: Diagnosis not present

## 2018-07-24 DIAGNOSIS — Z961 Presence of intraocular lens: Secondary | ICD-10-CM | POA: Diagnosis not present

## 2018-07-24 DIAGNOSIS — H26493 Other secondary cataract, bilateral: Secondary | ICD-10-CM | POA: Diagnosis not present

## 2018-07-24 DIAGNOSIS — H353122 Nonexudative age-related macular degeneration, left eye, intermediate dry stage: Secondary | ICD-10-CM | POA: Diagnosis not present

## 2018-07-24 DIAGNOSIS — H353211 Exudative age-related macular degeneration, right eye, with active choroidal neovascularization: Secondary | ICD-10-CM | POA: Diagnosis not present

## 2018-07-29 DIAGNOSIS — L821 Other seborrheic keratosis: Secondary | ICD-10-CM | POA: Diagnosis not present

## 2018-07-29 DIAGNOSIS — L570 Actinic keratosis: Secondary | ICD-10-CM | POA: Diagnosis not present

## 2018-07-29 DIAGNOSIS — R233 Spontaneous ecchymoses: Secondary | ICD-10-CM | POA: Diagnosis not present

## 2018-07-30 ENCOUNTER — Ambulatory Visit: Attending: Urology | Primary: Family Medicine

## 2018-07-30 ENCOUNTER — Ambulatory Visit: Admit: 2018-07-30 | Discharge: 2018-07-30 | Attending: Urology | Primary: Family Medicine

## 2018-07-30 DIAGNOSIS — N401 Enlarged prostate with lower urinary tract symptoms: Secondary | ICD-10-CM

## 2018-07-30 DIAGNOSIS — R3915 Urgency of urination: Secondary | ICD-10-CM | POA: Diagnosis not present

## 2018-07-30 LAB — AMB POC URINALYSIS DIP STICK AUTO W/O MICRO
Bilirubin (UA POC): NEGATIVE
Bilirubin, Urine, POC: NEGATIVE
Blood (UA POC): NEGATIVE
Blood (UA POC): NEGATIVE
Glucose (UA POC): NEGATIVE
Glucose, Urine, POC: NEGATIVE
Leukocyte Esterase, Urine, POC: NEGATIVE
Leukocyte esterase (UA POC): NEGATIVE
Nitrite, Urine, POC: NEGATIVE
Nitrites (UA POC): NEGATIVE
Protein (UA POC): NEGATIVE
Protein, Urine, POC: NEGATIVE
Specific Gravity, Urine, POC: 1.02 NA (ref 1.001–1.035)
Specific gravity (UA POC): 1.02 (ref 1.001–1.035)
Urobilinogen (UA POC): 0.2 (ref 0.2–1)
Urobilinogen, POC: 0.2 (ref 0.2–1)
pH (UA POC): 5.5 (ref 4.6–8.0)
pH, Urine, POC: 5.5 NA (ref 4.6–8.0)

## 2018-07-30 LAB — AMB POC PVR, MEAS,POST-VOID RES,US,NON-IMAGING
PVR POC: 1 cc
PVR: 1 cc

## 2018-07-30 MED ORDER — MIRABEGRON ER 25 MG TABLET,EXTENDED RELEASE 24 HR
25 mg | ORAL_TABLET | Freq: Every day | ORAL | 3 refills | Status: DC
Start: 2018-07-30 — End: 2018-09-16

## 2018-07-30 NOTE — Progress Notes (Signed)
Follow-up Visit           IMP:     ICD-10-CM ICD-9-CM    1. Benign non-nodular prostatic hyperplasia with lower urinary tract symptoms N40.1 600.91 AMB POC URINALYSIS DIP STICK AUTO W/O MICRO      AMB POC PVR, MEAS,POST-VOID RES,US,NON-IMAGING   2. Urinary urgency R39.15 788.63                  PLAN:   ?? PVR today is 1 cc.   ?? UA today is negative  ?? Discontinue Flomax 0.4 mg daily  ?? Continue Proscar 5 mg daily.  ?? Start Myrbetriq 25 mg daily. SE and dosing reviewed. Rx provided.  ?? Will call patient in 6 weeks to assess symptoms. If better, will continue Myrbetriq 25 mg daily. If symptoms still bothersome, will trial 50 mg.      Follow-up and Dispositions    ?? Return in about 6 weeks (around 09/10/2018) for phone call visit.              Chief Complaint   Patient presents with   ??? Benign Prostatic Hypertrophy        HPI: Matthew Chandler is a 84 y.o. WHITE OR CAUCASIAN male  who presents today in follow up for an established diagnosis of BPH. He is s/p UroLift 05/14/16. He had post op voiding trial on 06/03/16. He failed to voiding trial post op. Passed VT on 06/03/2016. At last visit on 06/19/2016, patient was instructed to continue Proscar and discontinue Flomax.     Patient presents today doing well. He is currently on Flomax 0.4 mg daily and Proscar 5 mg daily. States he is having more urinary urgency  ??  Social hx: After last visit, patient moved back to [place], [place]. Now lives in [place], [place].   ??  Pre Op:  Work up included AUA score in the severe range with refractory retention. UDS confirmed obstruction with some bladder function TRUS Vol: ~80 gms.     AUA Assessment Score:  AUA Score: 20;    AUA Bother Rating: Mixed-about equally satisfied      Current Outpatient Medications   Medication Sig Dispense Refill   ??? mirabegron ER (MYRBETRIQ) 25 mg ER tablet Take 1 Tab by mouth daily. 90 Tab 3   ??? apixaban (ELIQUIS) 2.5 mg tablet 2.5 mg.     ??? tamsulosin (FLOMAX) 0.4 mg capsule Take 1 Cap by mouth daily. 90 Cap 3    ??? citalopram (CELEXA) 10 mg tablet TK 1 T PO QD IN THE MORNING  11   ??? finasteride (PROSCAR) 5 mg tablet TK 1 T PO QD  3   ??? folic acid (FOLVITE) 1 mg tablet TK 1 T PO QD  3   ??? furosemide (LASIX) 40 mg tablet TAKE ONE-HALF TABLET PO PRF FLUID OR EDEMA  9   ??? omeprazole (PRILOSEC) 20 mg capsule Take 20 mg by mouth daily.     ??? hydrocortisone (CORTIZONE) 0.5 % ointment Apply  to affected area two (2) times a day. use thin layer     ??? polyethylene glycol (MIRALAX) 17 gram packet Take 17 g by mouth daily.     ??? senna-docusate (SENOKOT-S) 8.6-50 mg per tablet Take 1 Tab by mouth daily.     ??? fluticasone (FLONASE) 50 mcg/actuation nasal spray 2 Sprays by Both Nostrils route daily.     ??? loratadine (CLARITIN) 10 mg tablet Take 10 mg by mouth.     ???  multivitamin (ONE A DAY) tablet Take 1 Tab by mouth daily.     ??? VIT A/VIT C/VIT E/ZINC/COPPER (PRESERVISION AREDS PO) Take  by mouth.     ??? magnesium oxide (MAG-OX) 400 mg tablet Take 400 mg by mouth daily.     ??? ferrous sulfate 325 mg (65 mg iron) cpER Take  by mouth.         All:  No Known Allergies    Past Medical History:   Diagnosis Date   ??? Anemia, unspecified    ??? Aortic valve replaced    ??? Bacteremia 12/2015    MSSA   ??? Benign non-nodular prostatic hyperplasia with lower urinary tract symptoms    ??? Benign prostatic hyperplasia with lower urinary tract symptoms    ??? Chronic diastolic heart failure (HCC)    ??? Chronic idiopathic constipation    ??? Generalized edema    ??? GERD without esophagitis    ??? Gross hematuria    ??? History of aortic valve replacement with bioprosthetic valve    ??? History of depression    ??? 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 Hypokalemia    ??? Hyponatremia with excess extracellular fluid volume    ??? Long-term use of high-risk medication    ??? Muscle cramps    ??? Muscular deconditioning    ??? Reactive depression    ??? Spinal stenosis of lumbar region    ??? Typical atrial flutter (HCC)    ??? Urinary retention        Past Surgical  History:   Procedure Laterality Date   ??? HX APPENDECTOMY     ??? HX CATARACT REMOVAL     ??? HX CORONARY ARTERY BYPASS GRAFT  05/2013   ??? HX OTHER SURGICAL      NECK SURGERY   ??? HX TONSILLECTOMY     ??? HX UROLOGICAL  05/14/2016    [facility] - Cystoscopy and UroLift (transprostatic implants #10), Dr Asher Muir         Review of Systems  Constitutional: Fever: No  Skin: Rash: No  HEENT: Hearing difficulty: No  Eyes: Blurred vision: No  Cardiovascular: Chest pain: No  Respiratory: Shortness of breath: No  Gastrointestinal: Nausea/vomiting: No  Musculoskeletal: Back pain: No  Neurological: Weakness: No  Psychological: Memory loss: No  Comments/additional findings:       PHYSICAL EXAM:      Visit Vitals  BP 102/82   Ht 5\' 7"  (1.702 m)   Wt 151 lb (68.5 kg)   BMI 23.65 kg/m??     Constitutional: WDWN, Pleasant and appropriate affect, No acute distress.  CV:  No peripheral swelling noted  Resp: No SOB, no breathing difficulty  GI:  No abdominal masses or tenderness., No hepatospleenomegaly., and No hernias noted.  GU Male:  No CVA tenderness.  Skin: Normal color and texture and No rashes or erythema noted  Lymphatic: No grossly palpable groin adenopathy  Neuro/Psych:  Alert and Oriented x3, affect appropriate.      Lab:  Results for orders placed or performed in visit on 07/30/18   AMB POC URINALYSIS DIP STICK AUTO W/O MICRO   Result Value Ref Range    Color (UA POC) Yellow     Clarity (UA POC) Clear     Glucose (UA POC) Negative Negative    Bilirubin (UA POC) Negative Negative    Ketones (UA POC) Trace Negative    Specific gravity (UA POC) 1.020 1.001 - 1.035  Blood (UA POC) Negative Negative    pH (UA POC) 5.5 4.6 - 8.0    Protein (UA POC) Negative Negative    Urobilinogen (UA POC) 0.2 mg/dL 0.2 - 1    Nitrites (UA POC) Negative Negative    Leukocyte esterase (UA POC) Negative Negative   AMB POC PVR, MEAS,POST-VOID RES,US,NON-IMAGING   Result Value Ref Range    PVR 1 cc          Lurlean Nanny, MD    Documentation provided by  Iona Coach, medical scribe for Lurlean Nanny, MD

## 2018-07-30 NOTE — Progress Notes (Signed)
Progress  Notes by Lurlean Nanny, MD at 07/30/18 1530                Author: Lurlean Nanny, MD  Service: --  Author Type: Physician       Filed: 08/04/18 1600  Encounter Date: 07/30/2018  Status: Signed          Editor: Lurlean Nanny, MD (Physician)                       Follow-up Visit                IMP:              ICD-10-CM  ICD-9-CM             1.  Benign non-nodular prostatic hyperplasia with lower urinary tract symptoms  N40.1  600.91  AMB POC URINALYSIS DIP STICK AUTO W/O MICRO                AMB POC PVR, MEAS,POST-VOID RES,US,NON-IMAGING           2.  Urinary urgency  R39.15  788.63                            PLAN:    ??  PVR today is 1 cc.    ??  UA today is negative   ??  Discontinue Flomax 0.4 mg daily   ??  Continue Proscar 5 mg daily.   ??  Start Myrbetriq 25 mg daily. SE and dosing reviewed. Rx provided.   ??  Will call patient in 6 weeks to assess symptoms. If better, will continue Myrbetriq 25 mg daily. If symptoms still bothersome, will trial 50 mg.          Follow-up and Dispositions      ??  Return in about 6 weeks (around 09/10/2018) for phone call visit.                          Chief Complaint       Patient presents with        ?  Benign Prostatic Hypertrophy            HPI: Matthew Chandler  is a 84 y.o. WHITE OR CAUCASIAN  male  who presents today in follow up for an established diagnosis of BPH. He is s/p UroLift 05/14/16. He had post op voiding trial on 06/03/16.  He failed to voiding trial post op. Passed VT on 06/03/2016. At last visit on 06/19/2016, patient was instructed to continue Proscar and discontinue Flomax.       Patient presents today doing well. He is currently on Flomax 0.4 mg daily and Proscar 5 mg daily. States he is having more urinary urgency   ??   Social hx: After last visit, patient moved back to [place], [place]. Now lives in [place], [place].    ??   Pre Op:   Work up included AUA score in the severe range with refractory retention. UDS confirmed obstruction with some bladder  function TRUS Vol: ~80 gms.       AUA Assessment Score:   AUA Score: 20;     AUA Bother Rating: Mixed-about equally satisfied           Current Outpatient Medications          Medication  Sig  Dispense  Refill           ?  mirabegron ER (MYRBETRIQ) 25 mg ER tablet  Take 1 Tab by mouth daily.  90 Tab  3     ?  apixaban (ELIQUIS) 2.5 mg tablet  2.5 mg.         ?  tamsulosin (FLOMAX) 0.4 mg capsule  Take 1 Cap by mouth daily.  90 Cap  3     ?  citalopram (CELEXA) 10 mg tablet  TK 1 T PO QD IN THE MORNING    11     ?  finasteride (PROSCAR) 5 mg tablet  TK 1 T PO QD    3     ?  folic acid (FOLVITE) 1 mg tablet  TK 1 T PO QD    3     ?  furosemide (LASIX) 40 mg tablet  TAKE ONE-HALF TABLET PO PRF FLUID OR EDEMA    9     ?  omeprazole (PRILOSEC) 20 mg capsule  Take 20 mg by mouth daily.         ?  hydrocortisone (CORTIZONE) 0.5 % ointment  Apply  to affected area two (2) times a day. use thin layer         ?  polyethylene glycol (MIRALAX) 17 gram packet  Take 17 g by mouth daily.         ?  senna-docusate (SENOKOT-S) 8.6-50 mg per tablet  Take 1 Tab by mouth daily.         ?  fluticasone (FLONASE) 50 mcg/actuation nasal spray  2 Sprays by Both Nostrils route daily.         ?  loratadine (CLARITIN) 10 mg tablet  Take 10 mg by mouth.         ?  multivitamin (ONE A DAY) tablet  Take 1 Tab by mouth daily.         ?  VIT A/VIT C/VIT E/ZINC/COPPER (PRESERVISION AREDS PO)  Take  by mouth.         ?  magnesium oxide (MAG-OX) 400 mg tablet  Take 400 mg by mouth daily.               ?  ferrous sulfate 325 mg (65 mg iron) cpER  Take  by mouth.               All:   No Known Allergies        Past Medical History:        Diagnosis  Date         ?  Anemia, unspecified       ?  Aortic valve replaced       ?  Bacteremia  12/2015          MSSA         ?  Benign non-nodular prostatic hyperplasia with lower urinary tract symptoms       ?  Benign prostatic hyperplasia with lower urinary tract symptoms       ?  Chronic diastolic heart failure  (HCC)       ?  Chronic idiopathic constipation       ?  Generalized edema       ?  GERD without esophagitis       ?  Gross hematuria       ?  History of aortic valve replacement with bioprosthetic valve       ?  History of depression       ?  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  Hypokalemia       ?  Hyponatremia with excess extracellular fluid volume       ?  Long-term use of high-risk medication       ?  Muscle cramps       ?  Muscular deconditioning       ?  Reactive depression       ?  Spinal stenosis of lumbar region       ?  Typical atrial flutter (HCC)           ?  Urinary retention               Past Surgical History:         Procedure  Laterality  Date          ?  HX APPENDECTOMY         ?  HX CATARACT REMOVAL         ?  HX CORONARY ARTERY BYPASS GRAFT    05/2013     ?  HX OTHER SURGICAL              NECK SURGERY          ?  HX TONSILLECTOMY         ?  HX UROLOGICAL    05/14/2016          [facility] - Cystoscopy and UroLift (transprostatic implants #10), Dr Asher Muir              Review of Systems   Constitutional: Fever: No   Skin: Rash: No   HEENT: Hearing difficulty: No   Eyes: Blurred vision: No   Cardiovascular: Chest pain: No   Respiratory: Shortness of breath: No   Gastrointestinal: Nausea/vomiting: No   Musculoskeletal: Back pain: No   Neurological: Weakness: No   Psychological: Memory loss: No   Comments/additional findings:          PHYSICAL EXAM:        Visit Vitals      BP  102/82     Ht  5\' 7"  (1.702 m)     Wt  151 lb (68.5 kg)        BMI  23.65 kg/m??        Constitutional: WDWN, Pleasant and appropriate affect, No acute distress.   CV:  No peripheral swelling noted   Resp: No SOB, no breathing difficulty   GI:  No abdominal masses or tenderness., No hepatospleenomegaly., and No hernias noted.   GU Male:  No CVA tenderness.   Skin: Normal color and texture and No rashes or erythema noted   Lymphatic: No grossly palpable groin adenopathy   Neuro/Psych:  Alert and Oriented  x3, affect appropriate.        Lab:     Results for orders placed or performed in visit on 07/30/18     AMB POC URINALYSIS DIP STICK AUTO W/O MICRO         Result  Value  Ref Range            Color (UA POC)  Yellow         Clarity (UA POC)  Clear         Glucose (UA POC)  Negative  Negative       Bilirubin (UA POC)  Negative  Negative       Ketones (UA POC)  Trace  Negative       Specific gravity (UA POC)  1.020  1.001 - 1.035       Blood (UA POC)  Negative  Negative       pH (UA POC)  5.5  4.6 - 8.0       Protein (UA POC)  Negative  Negative       Urobilinogen (UA POC)  0.2 mg/dL  0.2 - 1       Nitrites (UA POC)  Negative  Negative       Leukocyte esterase (UA POC)  Negative  Negative       AMB POC PVR, MEAS,POST-VOID RES,US,NON-IMAGING         Result  Value  Ref Range            PVR  1  cc               Lurlean Nanny, MD      Documentation provided by Iona Coach, medical scribe for Lurlean Nanny, MD

## 2018-07-31 ENCOUNTER — Encounter: Attending: Urology | Primary: Family Medicine

## 2018-08-06 ENCOUNTER — Encounter: Attending: Urology | Primary: Family Medicine

## 2018-08-11 DIAGNOSIS — Z789 Other specified health status: Secondary | ICD-10-CM | POA: Diagnosis not present

## 2018-08-11 DIAGNOSIS — G453 Amaurosis fugax: Secondary | ICD-10-CM | POA: Diagnosis not present

## 2018-08-11 DIAGNOSIS — Z954 Presence of other heart-valve replacement: Secondary | ICD-10-CM | POA: Diagnosis not present

## 2018-08-11 DIAGNOSIS — Z23 Encounter for immunization: Secondary | ICD-10-CM | POA: Diagnosis not present

## 2018-08-18 DIAGNOSIS — G453 Amaurosis fugax: Secondary | ICD-10-CM | POA: Diagnosis not present

## 2018-08-18 DIAGNOSIS — I6523 Occlusion and stenosis of bilateral carotid arteries: Secondary | ICD-10-CM | POA: Diagnosis not present

## 2018-08-25 DIAGNOSIS — H353211 Exudative age-related macular degeneration, right eye, with active choroidal neovascularization: Secondary | ICD-10-CM | POA: Diagnosis not present

## 2018-08-25 DIAGNOSIS — H353231 Exudative age-related macular degeneration, bilateral, with active choroidal neovascularization: Secondary | ICD-10-CM | POA: Diagnosis not present

## 2018-09-16 ENCOUNTER — Ambulatory Visit: Attending: Urology | Primary: Family Medicine

## 2018-09-16 ENCOUNTER — Ambulatory Visit: Admit: 2018-09-16 | Discharge: 2018-09-16 | Attending: Urology | Primary: Family Medicine

## 2018-09-16 DIAGNOSIS — N401 Enlarged prostate with lower urinary tract symptoms: Secondary | ICD-10-CM

## 2018-09-16 NOTE — Progress Notes (Signed)
Error

## 2018-09-16 NOTE — Progress Notes (Deleted)
error 

## 2018-09-17 ENCOUNTER — Encounter: Attending: Urology | Primary: Family Medicine

## 2018-09-18 DIAGNOSIS — I441 Atrioventricular block, second degree: Secondary | ICD-10-CM | POA: Diagnosis not present

## 2018-09-18 DIAGNOSIS — I4892 Unspecified atrial flutter: Secondary | ICD-10-CM | POA: Diagnosis not present

## 2018-09-18 DIAGNOSIS — R001 Bradycardia, unspecified: Secondary | ICD-10-CM | POA: Diagnosis not present

## 2018-09-22 MED ORDER — MIRABEGRON ER 50 MG TABLET,EXTENDED RELEASE 24 HR
50 mg | ORAL_TABLET | Freq: Every day | ORAL | 3 refills | Status: DC
Start: 2018-09-22 — End: 2020-08-29

## 2018-09-22 MED ORDER — MIRABEGRON ER 50 MG TABLET,EXTENDED RELEASE 24 HR
50 mg | ORAL_TABLET | Freq: Every day | ORAL | 3 refills | Status: DC
Start: 2018-09-22 — End: 2021-01-11

## 2018-09-22 MED ORDER — FINASTERIDE 5 MG TAB
5 mg | ORAL_TABLET | Freq: Every day | ORAL | 3 refills | Status: DC
Start: 2018-09-22 — End: 2020-08-29

## 2018-09-22 NOTE — Telephone Encounter (Signed)
Pts daughter Tiajuana AmassLee Leidy called in stating pts Myrbetriq 50 mg needs to be sent to [facility]  ph [phone].    Thank you,  TRoth

## 2018-09-22 NOTE — Addendum Note (Signed)
Addended by: Lexine BatonBELL, Camrin Gearheart on: 09/22/2018 03:47 PM     Modules accepted: Orders

## 2018-09-22 NOTE — Telephone Encounter (Signed)
Philis NettleLisa Turner at [facility], called into the office looking for Myrbetriq 50mg  rx order.  She states that the daughter advised them to increase the dose from 25 mg to 50.  I told Misty StanleyLisa that I would ask the nurse to send a new rx to the pt's pharmacy and notify her to when it's been done.  She agreed, thanked me and call was ended.     Please review and respond.  Misty StanleyLisa can be reached at [phone]

## 2018-09-22 NOTE — Telephone Encounter (Signed)
Medication sent to corrected pharmacy.  Lexine BatonKinshata Delayna Sparlin, [facility]CMA

## 2018-09-22 NOTE — Telephone Encounter (Signed)
Prescription was [facility] on [facility]. Left message with Diplomatic Services operational officersecretary at [facility].  Lexine BatonKinshata Jaz Laningham, [facility]CMA

## 2018-09-22 NOTE — Addendum Note (Signed)
Addendum Note  by Lexine BatonBell, Kinshata at 09/22/18 1547                Author: Lexine BatonBell, Kinshata  Service: --  Author Type: Medical Assistant       Filed: 09/22/18 1547  Encounter Date: 09/22/2018  Status: Signed          Editor: Lexine BatonBell, Kinshata (Medical Assistant)          Addended by: Lexine BatonBELL, KINSHATA on: 09/22/2018 03:47 PM    Modules accepted: Orders

## 2018-09-28 DIAGNOSIS — I4892 Unspecified atrial flutter: Secondary | ICD-10-CM | POA: Diagnosis not present

## 2018-09-28 DIAGNOSIS — I441 Atrioventricular block, second degree: Secondary | ICD-10-CM | POA: Diagnosis not present

## 2018-09-28 DIAGNOSIS — R001 Bradycardia, unspecified: Secondary | ICD-10-CM | POA: Diagnosis not present

## 2018-10-08 DIAGNOSIS — H353221 Exudative age-related macular degeneration, left eye, with active choroidal neovascularization: Secondary | ICD-10-CM | POA: Diagnosis not present

## 2018-10-08 DIAGNOSIS — H353211 Exudative age-related macular degeneration, right eye, with active choroidal neovascularization: Secondary | ICD-10-CM | POA: Diagnosis not present

## 2018-12-25 NOTE — Telephone Encounter (Signed)
ERROR

## 2019-05-19 ENCOUNTER — Encounter: Attending: Internal Medicine | Primary: Internal Medicine

## 2019-06-04 ENCOUNTER — Encounter: Attending: Internal Medicine | Primary: Internal Medicine

## 2019-06-25 ENCOUNTER — Ambulatory Visit: Attending: Internal Medicine | Primary: Family Medicine

## 2019-06-25 ENCOUNTER — Encounter

## 2019-06-25 ENCOUNTER — Ambulatory Visit: Admit: 2019-06-25 | Discharge: 2019-06-25 | Payer: MEDICARE | Attending: Internal Medicine | Primary: Internal Medicine

## 2019-06-25 DIAGNOSIS — I10 Essential (primary) hypertension: Secondary | ICD-10-CM

## 2019-06-25 NOTE — Progress Notes (Signed)
Matthew Chandler presents today for   Chief Complaint   Patient presents with   ??? Hypertension       Is someone accompanying this pt? yes, daughter     Is the patient using any DME equipment during [place]? yes, cane     Depression Screening:  3 most recent PHQ Screens 06/25/2019   Little interest or pleasure in doing things Not at all   Feeling down, depressed, irritable, or hopeless Not at all   Total Score PHQ 2 0       Learning Assessment:  Learning Assessment 06/25/2019   PRIMARY LEARNER Patient   HIGHEST LEVEL OF EDUCATION - PRIMARY LEARNER  4 YEARS [place] CAREGIVER No   PRIMARY LANGUAGE ENGLISH   LEARNER PREFERENCE PRIMARY READING   ANSWERED BY patient   RELATIONSHIP SELF       Abuse Screening:  Abuse Screening Questionnaire 06/25/2019   Do you ever feel afraid of your partner? N   Are you in a relationship with someone who physically or mentally threatens you? N   Is it safe for you to go home? Y       Fall Risk  Fall Risk Assessment, last 12 mths 06/25/2019   Able to walk? Yes   Fall in past 12 months? No       ADL  ADL Assessment 06/25/2019   Feeding yourself No Help Needed   Getting from bed to chair No Help Needed   Getting dressed No Help Needed   Bathing or showering No Help Needed   Walk across the room (includes cane/walker) No Help Needed   Using the telphone No Help Needed   Taking your medications No Help Needed   Preparing meals No Help Needed   Managing money (expenses/bills) No Help Needed   Moderately strenuous housework (laundry) No Help Needed   Shopping for personal items (toiletries/medicines) No Help Needed   Shopping for groceries No Help Needed   Driving No Help Needed   Climbing a flight of stairs No Help Needed   Getting to places beyond walking distances No Help Needed       Health Maintenance reviewed and discussed and ordered per Provider.    Health Maintenance Due   Topic Date Due   ??? DTaP/Tdap/Td series (1 - Tdap) 01/24/1945    ??? Shingrix Vaccine Age 38> (1 of 2) 01/24/1976   ??? GLAUCOMA SCREENING Q2Y  01/24/1991   ??? Pneumococcal 65+ years (1 of 1 - PPSV23) 01/24/1991   ??? Medicare Yearly Exam  01/08/2017   .      Coordination of Care:  1. Have you been to the ER, urgent care clinic since your last visit? Hospitalized since your last visit? yes ER for a sensation of a pill remaining in his esophagus    2. Have you seen or consulted any other health care providers outside of the [facility] since your last visit? Include any pap smears or colon screening. no

## 2019-06-25 NOTE — Progress Notes (Signed)
Matthew Chandler is a 85 y.o. male presenting for           Chief Complaint   Patient presents with   ??? Hypertension        HPI patient is in for follow-up and generally doing well.  He recently had an ER visit for a sensation that a pill had hung up in his throat.  He went to the emergency room had an x-ray was examined and was deemed to be okay without evidence of obstruction or pill being up.  He still occasionally gets a little bit of difficulty and discomfort from the previous irritation but for the most part he is doing well.  He recently lost his wife but he seems to be handling things pretty well.  His daughter who accompanies him is not aware of any new complaints or problems.  Looking back it is been quite a while since he has had some blood work done and since he does have a diuretic and magnesium supplementation I thought it might be reasonable to check a basic metabolic panel and a magnesium.    Past Medical History:   Diagnosis Date   ??? Anemia, unspecified    ??? Aortic valve replaced    ??? Bacteremia 12/2015    MSSA   ??? Benign non-nodular prostatic hyperplasia with lower urinary tract symptoms    ??? Benign prostatic hyperplasia with lower urinary tract symptoms    ??? Chronic diastolic heart failure (HCC)    ??? Chronic idiopathic constipation    ??? Generalized edema    ??? GERD without esophagitis    ??? Gross hematuria    ??? History of aortic valve replacement with bioprosthetic valve    ??? History of depression    ??? 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 Hypertension, essential 11/12/2017   ??? Hypokalemia    ??? Hyponatremia with excess extracellular fluid volume    ??? Long-term use of high-risk medication    ??? Muscle cramps    ??? Muscular deconditioning    ??? Reactive depression    ??? Spinal stenosis of lumbar region    ??? Typical atrial flutter (HCC)    ??? Unspecified osteoarthritis, unspecified site 03/03/2019   ??? Urinary retention    ??? Urinary urgency          Current Outpatient Medications on File Prior to Visit   Medication Sig Dispense Refill   ??? baclofen 5 mg tab Take  by mouth.     ??? traMADoL (ULTRAM) 50 mg tablet Take 50 mg by mouth every six (6) hours as needed for Pain.     ??? tiZANidine (ZANAFLEX) 2 mg tablet Take  by mouth.     ??? predniSONE (DELTASONE) 10 mg tablet Take  by mouth daily (with breakfast).     ??? azelastine (ASTELIN) 137 mcg (0.1 %) nasal spray 1 Spray two (2) times a day. Use in each nostril as directed     ??? amoxicillin (AMOXIL) 500 mg capsule Take 500 mg by mouth.     ??? ammonium lactate (AMLACTIN) 12 % topical cream Apply  to affected area two (2) times a day. rub in to affected area well     ??? nitroglycerin (NITROSTAT) 0.4 mg SL tablet 0.4 mg by SubLINGual route every five (5) minutes as needed for Chest Pain. Up to 3 doses.     ??? mirabegron ER (MYRBETRIQ) 50 mg ER tablet Take 1 Tab by mouth daily.  90 Tab 3   ??? finasteride (PROSCAR) 5 mg tablet Take 1 Tab by mouth daily. 90 Tab 3   ??? mirabegron ER (MYRBETRIQ) 50 mg ER tablet Take 1 Tab by mouth daily. 90 Tab 3   ??? apixaban (ELIQUIS) 2.5 mg tablet 2.5 mg.     ??? tamsulosin (FLOMAX) 0.4 mg capsule Take 1 Cap by mouth daily. 90 Cap 3   ??? citalopram (CELEXA) 10 mg tablet TK 1 T PO QD IN THE MORNING  11   ??? finasteride (PROSCAR) 5 mg tablet TK 1 T PO QD  3   ??? folic acid (FOLVITE) 1 mg tablet TK 1 T PO QD  3   ??? furosemide (LASIX) 40 mg tablet TAKE ONE-HALF TABLET PO PRF FLUID OR EDEMA  9   ??? omeprazole (PRILOSEC) 20 mg capsule Take 20 mg by mouth daily.     ??? hydrocortisone (CORTIZONE) 0.5 % ointment Apply  to affected area two (2) times a day. use thin layer     ??? polyethylene glycol (MIRALAX) 17 gram packet Take 17 g by mouth daily.     ??? senna-docusate (SENOKOT-S) 8.6-50 mg per tablet Take 1 Tab by mouth daily.     ??? fluticasone (FLONASE) 50 mcg/actuation nasal spray 2 Sprays by Both Nostrils route daily.     ??? loratadine (CLARITIN) 10 mg tablet Take 10 mg by mouth.      ??? multivitamin (ONE A DAY) tablet Take 1 Tab by mouth daily.     ??? VIT A/VIT C/VIT E/ZINC/COPPER (PRESERVISION AREDS PO) Take  by mouth.     ??? magnesium oxide (MAG-OX) 400 mg tablet Take 400 mg by mouth daily.     ??? ferrous sulfate 325 mg (65 mg iron) cpER Take  by mouth.       No current facility-administered medications on file prior to visit.         ROS all systems reviewed and negative except as noted in the history of present illness  Visit Vitals  BP 128/76 (BP 1 Location: Right arm, BP Patient Position: Sitting)   Pulse 60 Comment: Irregularly irregular   Temp 98.2 ??F (36.8 ??C)   Resp 20   Ht 5\' 8"  (1.727 m)   Wt 147 lb (66.7 kg)   BMI 22.35 kg/m??        Physical Exam well-developed Caucasian male alert oriented cooperative no acute distress normocephalic conjunctiva pink sclera anicteric neck supple no lymphadenopathy did not feel any mass or thyromegaly no tenderness.  Lungs are bilaterally clear to auscultation with good air exchange heart rhythm regular no murmurs or extrasystoles abdomen is soft and unremarkable extremities with only trace of edema bilaterally    Assessment & Plan I am ordering a basic metabolic panel and magnesium.  I would like to see him back routinely.

## 2019-06-25 NOTE — Progress Notes (Signed)
Matthew Chandler is a 85 y.o. male presenting for           Chief Complaint   Patient presents with   ??? Hypertension        HPI patient is in for follow-up and generally doing well.  He recently had an ER visit for a sensation that a pill had hung up in his throat.  He went to the emergency room had an x-ray was examined and was deemed to be okay without evidence of obstruction or pill being up.  He still occasionally gets a little bit of difficulty and discomfort from the previous irritation but for the most part he is doing well.  He recently lost his wife but he seems to be handling things pretty well.  His daughter who accompanies him is not aware of any new complaints or problems.  Looking back it is been quite a while since he has had some blood work done and since he does have a diuretic and magnesium supplementation I thought it might be reasonable to check a basic metabolic panel and a magnesium.    Past Medical History:   Diagnosis Date   ??? Anemia, unspecified    ??? Aortic valve replaced    ??? Bacteremia 12/2015    MSSA   ??? Benign non-nodular prostatic hyperplasia with lower urinary tract symptoms    ??? Benign prostatic hyperplasia with lower urinary tract symptoms    ??? Chronic diastolic heart failure ([place])    ??? Chronic idiopathic constipation    ??? Generalized edema    ??? GERD without esophagitis    ??? Gross hematuria    ??? History of aortic valve replacement with bioprosthetic valve    ??? History of depression    ??? 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 Hypertension, essential 11/12/2017   ??? Hypokalemia    ??? Hyponatremia with excess extracellular fluid volume    ??? Long-term use of high-risk medication    ??? Muscle cramps    ??? Muscular deconditioning    ??? Reactive depression    ??? Spinal stenosis of lumbar region    ??? Typical atrial flutter ([place])    ??? Unspecified osteoarthritis, unspecified site 03/03/2019   ??? Urinary retention    ??? Urinary urgency         Current Outpatient Medications on File  Prior to Visit   Medication Sig Dispense Refill   ??? baclofen 5 mg tab Take  by mouth.     ??? traMADoL (ULTRAM) 50 mg tablet Take 50 mg by mouth every six (6) hours as needed for Pain.     ??? tiZANidine (ZANAFLEX) 2 mg tablet Take  by mouth.     ??? predniSONE (DELTASONE) 10 mg tablet Take  by mouth daily (with breakfast).     ??? azelastine (ASTELIN) 137 mcg (0.1 %) nasal spray 1 Spray two (2) times a day. Use in each nostril as directed     ??? amoxicillin (AMOXIL) 500 mg capsule Take 500 mg by mouth.     ??? ammonium lactate (AMLACTIN) 12 % topical cream Apply  to affected area two (2) times a day. rub in to affected area well     ??? nitroglycerin (NITROSTAT) 0.4 mg SL tablet 0.4 mg by SubLINGual route every five (5) minutes as needed for Chest Pain. Up to 3 doses.     ??? mirabegron ER (MYRBETRIQ) 50 mg ER tablet Take 1 Tab by mouth daily.  90 Tab 3   ??? finasteride (PROSCAR) 5 mg tablet Take 1 Tab by mouth daily. 90 Tab 3   ??? mirabegron ER (MYRBETRIQ) 50 mg ER tablet Take 1 Tab by mouth daily. 90 Tab 3   ??? apixaban (ELIQUIS) 2.5 mg tablet 2.5 mg.     ??? tamsulosin (FLOMAX) 0.4 mg capsule Take 1 Cap by mouth daily. 90 Cap 3   ??? citalopram (CELEXA) 10 mg tablet TK 1 T PO QD IN THE MORNING  11   ??? finasteride (PROSCAR) 5 mg tablet TK 1 T PO QD  3   ??? folic acid (FOLVITE) 1 mg tablet TK 1 T PO QD  3   ??? furosemide (LASIX) 40 mg tablet TAKE ONE-HALF TABLET PO PRF FLUID OR EDEMA  9   ??? omeprazole (PRILOSEC) 20 mg capsule Take 20 mg by mouth daily.     ??? hydrocortisone (CORTIZONE) 0.5 % ointment Apply  to affected area two (2) times a day. use thin layer     ??? polyethylene glycol (MIRALAX) 17 gram packet Take 17 g by mouth daily.     ??? senna-docusate (SENOKOT-S) 8.6-50 mg per tablet Take 1 Tab by mouth daily.     ??? fluticasone (FLONASE) 50 mcg/actuation nasal spray 2 Sprays by Both Nostrils route daily.     ??? loratadine (CLARITIN) 10 mg tablet Take 10 mg by mouth.     ??? multivitamin (ONE A DAY) tablet Take 1 Tab by mouth daily.     ???  VIT A/VIT C/VIT E/ZINC/COPPER (PRESERVISION AREDS PO) Take  by mouth.     ??? magnesium oxide (MAG-OX) 400 mg tablet Take 400 mg by mouth daily.     ??? ferrous sulfate 325 mg (65 mg iron) cpER Take  by mouth.       No current facility-administered medications on file prior to visit.         ROS all systems reviewed and negative except as noted in the history of present illness  Visit Vitals  BP 128/76 (BP 1 Location: Right arm, BP Patient Position: Sitting)   Pulse 60 Comment: Irregularly irregular   Temp 98.2 ??F (36.8 ??C)   Resp 20   Ht 5\' 8"  (1.727 m)   Wt 147 lb (66.7 kg)   BMI 22.35 kg/m??        Physical Exam well-developed Caucasian male alert oriented cooperative no acute distress normocephalic conjunctiva pink sclera anicteric neck supple no lymphadenopathy did not feel any mass or thyromegaly no tenderness.  Lungs are bilaterally clear to auscultation with good air exchange heart rhythm regular no murmurs or extrasystoles abdomen is soft and unremarkable extremities with only trace of edema bilaterally    Assessment & Plan I am ordering a basic metabolic panel and magnesium.  I would like to see him back routinely.

## 2019-06-25 NOTE — Progress Notes (Signed)
 Matthew Chandler presents today for   Chief Complaint   Patient presents with   . Hypertension       Is someone accompanying this pt? yes, daughter     Is the patient using any DME equipment during OV? yes, cane     Depression Screening:  3 most recent PHQ Screens 06/25/2019   Little interest or pleasure in doing things Not at all   Feeling down, depressed, irritable, or hopeless Not at all   Total Score PHQ 2 0       Learning Assessment:  Learning Assessment 06/25/2019   PRIMARY LEARNER Patient   HIGHEST LEVEL OF EDUCATION - PRIMARY LEARNER  4 YEARS OF COLLEGE   BARRIERS PRIMARY LEARNER NONE   CO-LEARNER CAREGIVER No   PRIMARY LANGUAGE ENGLISH   LEARNER PREFERENCE PRIMARY READING   ANSWERED BY patient   RELATIONSHIP SELF       Abuse Screening:  Abuse Screening Questionnaire 06/25/2019   Do you ever feel afraid of your partner? N   Are you in a relationship with someone who physically or mentally threatens you? N   Is it safe for you to go home? Y       Fall Risk  Fall Risk Assessment, last 12 mths 06/25/2019   Able to walk? Yes   Fall in past 12 months? No       ADL  ADL Assessment 06/25/2019   Feeding yourself No Help Needed   Getting from bed to chair No Help Needed   Getting dressed No Help Needed   Bathing or showering No Help Needed   Walk across the room (includes cane/walker) No Help Needed   Using the telphone No Help Needed   Taking your medications No Help Needed   Preparing meals No Help Needed   Managing money (expenses/bills) No Help Needed   Moderately strenuous housework (laundry) No Help Needed   Shopping for personal items (toiletries/medicines) No Help Needed   Shopping for groceries No Help Needed   Driving No Help Needed   Climbing a flight of stairs No Help Needed   Getting to places beyond walking distances No Help Needed       Health Maintenance reviewed and discussed and ordered per Provider.    Health Maintenance Due   Topic Date Due   . DTaP/Tdap/Td series (1 - Tdap) 01/24/1945   . Shingrix  Vaccine Age 58> (1 of 2) 01/24/1976   . GLAUCOMA SCREENING Q2Y  01/24/1991   . Pneumococcal 65+ years (1 of 1 - PPSV23) 01/24/1991   . Medicare Yearly Exam  01/08/2017   .      Coordination of Care:  1. Have you been to the ER, urgent care clinic since your last visit? Hospitalized since your last visit? yes ER for a sensation of a pill remaining in his esophagus    2. Have you seen or consulted any other health care providers outside of the [facility] since your last visit? Include any pap smears or colon screening. no

## 2019-10-26 ENCOUNTER — Encounter: Attending: Internal Medicine | Primary: Internal Medicine

## 2019-10-27 ENCOUNTER — Ambulatory Visit: Attending: Internal Medicine | Primary: Family Medicine

## 2019-10-27 ENCOUNTER — Ambulatory Visit: Admit: 2019-10-27 | Discharge: 2019-10-27 | Payer: MEDICARE | Attending: Internal Medicine | Primary: Internal Medicine

## 2019-10-27 DIAGNOSIS — J310 Chronic rhinitis: Secondary | ICD-10-CM

## 2019-10-27 MED ORDER — AZELASTINE 137 MCG NASAL SPRAY AEROSOL
137 mcg (0.1 %) | Freq: Two times a day (BID) | NASAL | 5 refills | Status: DC
Start: 2019-10-27 — End: 2020-08-29

## 2019-10-27 NOTE — Progress Notes (Signed)
Matthew Chandler is a 85 y.o. male presenting for chronic rhinitis          Chief Complaint   Patient presents with   ??? Hypertension        HPI comes in for follow-up of his multiple ongoing problems.  He seems to be generally doing well and is adjusting to assisted living and getting along without his wife.  He seems to believe that the nasal spray that he has has lost its potency and would like a new prescription sent in for that which I told him we would do.  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 Hetty Blend is planning to remove it more extensively today.  It seems he is tolerating his medicine and generally everything is stable.  His daughter who accompanies him is not aware of any new or acute complaints.    Past Medical History:   Diagnosis Date   ??? Anemia, unspecified    ??? Aortic valve replaced    ??? Bacteremia 12/2015    MSSA   ??? Benign non-nodular prostatic hyperplasia with lower urinary tract symptoms    ??? Benign prostatic hyperplasia with lower urinary tract symptoms    ??? Chronic diastolic heart failure ([facility])    ??? Chronic idiopathic constipation    ??? Generalized edema    ??? GERD without esophagitis    ??? Gross hematuria    ??? History of aortic valve replacement with bioprosthetic valve    ??? History of depression    ??? 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 Hypertension, essential 11/12/2017   ??? Hypokalemia    ??? Hyponatremia with excess extracellular fluid volume    ??? Long-term use of high-risk medication    ??? Muscle cramps    ??? Muscular deconditioning    ??? Reactive depression    ??? Spinal stenosis of lumbar region    ??? Typical atrial flutter ([facility])    ??? Unspecified osteoarthritis, unspecified site 03/03/2019   ??? Urinary retention    ??? Urinary urgency         Current Outpatient Medications on File Prior to Visit   Medication Sig Dispense Refill   ??? cholecalciferol, vitamin D3, 50 mcg (2,000 unit) tab Take 1 Tab by mouth daily.      ??? traMADoL (ULTRAM) 50 mg tablet Take 50 mg by mouth every six (6) hours as needed for Pain.     ??? tiZANidine (ZANAFLEX) 2 mg tablet Take  by mouth.     ??? azelastine (ASTELIN) 137 mcg (0.1 %) nasal spray 1 Spray two (2) times a day. Use in each nostril as directed     ??? ammonium lactate (AMLACTIN) 12 % topical cream Apply  to affected area two (2) times a day. rub in to affected area well     ??? nitroglycerin (NITROSTAT) 0.4 mg SL tablet 0.4 mg by SubLINGual route every five (5) minutes as needed for Chest Pain. Up to 3 doses.     ??? mirabegron ER (MYRBETRIQ) 50 mg ER tablet Take 1 Tab by mouth daily. 90 Tab 3   ??? apixaban (ELIQUIS) 2.5 mg tablet 2.5 mg.     ??? citalopram (CELEXA) 10 mg tablet TK 1 T PO QD IN THE MORNING  11   ??? finasteride (PROSCAR) 5 mg tablet TK 1 T PO QD  3   ??? folic acid (FOLVITE) 1 mg tablet TK 1 T PO QD  3   ??? furosemide (LASIX) 40 mg tablet TAKE ONE-HALF TABLET PO PRF FLUID OR EDEMA  9   ??? omeprazole (PRILOSEC) 20 mg capsule Take 20 mg by mouth daily.     ??? fluticasone (FLONASE) 50 mcg/actuation nasal spray 2 Sprays by Both Nostrils route daily.     ??? loratadine (CLARITIN) 10 mg tablet Take 10 mg by mouth.     ??? multivitamin (ONE A DAY) tablet Take 1 Tab by mouth daily.     ??? VIT A/VIT C/VIT E/ZINC/COPPER (PRESERVISION AREDS PO) Take  by mouth.     ??? magnesium oxide (MAG-OX) 400 mg tablet Take 400 mg by mouth daily.     ??? baclofen 5 mg tab Take  by mouth.     ??? predniSONE (DELTASONE) 10 mg tablet Take  by mouth daily (with breakfast).     ??? amoxicillin (AMOXIL) 500 mg capsule Take 500 mg by mouth.     ??? finasteride (PROSCAR) 5 mg tablet Take 1 Tab by mouth daily. 90 Tab 3   ??? mirabegron ER (MYRBETRIQ) 50 mg ER tablet Take 1 Tab by mouth daily. 90 Tab 3   ??? tamsulosin (FLOMAX) 0.4 mg capsule Take 1 Cap by mouth daily. 90 Cap 3   ??? hydrocortisone (CORTIZONE) 0.5 % ointment Apply  to affected area two (2) times a day. use thin layer      ??? polyethylene glycol (MIRALAX) 17 gram packet Take 17 g by mouth daily.     ??? senna-docusate (SENOKOT-S) 8.6-50 mg per tablet Take 1 Tab by mouth daily.     ??? ferrous sulfate 325 mg (65 mg iron) cpER Take  by mouth.       No current facility-administered medications on file prior to visit.         ROS all systems reviewed and negative    Visit Vitals  BP 110/64 (BP 1 Location: Left arm, BP Patient Position: Sitting)   Temp 97.7 ??F (36.5 ??C)   Resp 20   Ht 5\' 8"  (1.727 m)   Wt 146 lb 6.4 oz (66.4 kg)   BMI 22.26 kg/m??        Physical Exam well-developed pleasantly confused Caucasian man alert and oriented no acute distress normocephalic wearing a mask conjunctiva pink neck supple no lymphadenopathy lungs clear to auscultation bilaterally heart sounds regular today extremities trace ankle edema bilaterally.  We will call him the azelastine spray for him and see him routinely in 3 months  Assessment & Plan as noted above

## 2019-10-27 NOTE — Progress Notes (Signed)
Matthew Chandler is a 85 y.o. male presenting for chronic rhinitis          Chief Complaint   Patient presents with   ??? Hypertension        HPI comes in for follow-up of his multiple ongoing problems.  He seems to be generally doing well and is adjusting to assisted living and getting along without his wife.  He seems to believe that the nasal spray that he has has lost its potency and would like a new prescription sent in for that which I told him we would do.  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 Hetty Blend is planning to remove it more extensively today.  It seems he is tolerating his medicine and generally everything is stable.  His daughter who accompanies him is not aware of any new or acute complaints.    Past Medical History:   Diagnosis Date   ??? Anemia, unspecified    ??? Aortic valve replaced    ??? Bacteremia 12/2015    MSSA   ??? Benign non-nodular prostatic hyperplasia with lower urinary tract symptoms    ??? Benign prostatic hyperplasia with lower urinary tract symptoms    ??? Chronic diastolic heart failure ([facility])    ??? Chronic idiopathic constipation    ??? Generalized edema    ??? GERD without esophagitis    ??? Gross hematuria    ??? History of aortic valve replacement with bioprosthetic valve    ??? History of depression    ??? 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 Hypertension, essential 11/12/2017   ??? Hypokalemia    ??? Hyponatremia with excess extracellular fluid volume    ??? Long-term use of high-risk medication    ??? Muscle cramps    ??? Muscular deconditioning    ??? Reactive depression    ??? Spinal stenosis of lumbar region    ??? Typical atrial flutter ([facility])    ??? Unspecified osteoarthritis, unspecified site 03/03/2019   ??? Urinary retention    ??? Urinary urgency         Current Outpatient Medications on File Prior to Visit   Medication Sig Dispense Refill   ??? cholecalciferol, vitamin D3, 50 mcg (2,000 unit) tab Take 1 Tab by mouth daily.     ??? traMADoL (ULTRAM) 50 mg tablet Take  50 mg by mouth every six (6) hours as needed for Pain.     ??? tiZANidine (ZANAFLEX) 2 mg tablet Take  by mouth.     ??? azelastine (ASTELIN) 137 mcg (0.1 %) nasal spray 1 Spray two (2) times a day. Use in each nostril as directed     ??? ammonium lactate (AMLACTIN) 12 % topical cream Apply  to affected area two (2) times a day. rub in to affected area well     ??? nitroglycerin (NITROSTAT) 0.4 mg SL tablet 0.4 mg by SubLINGual route every five (5) minutes as needed for Chest Pain. Up to 3 doses.     ??? mirabegron ER (MYRBETRIQ) 50 mg ER tablet Take 1 Tab by mouth daily. 90 Tab 3   ??? apixaban (ELIQUIS) 2.5 mg tablet 2.5 mg.     ??? citalopram (CELEXA) 10 mg tablet TK 1 T PO QD IN THE MORNING  11   ??? finasteride (PROSCAR) 5 mg tablet TK 1 T PO QD  3   ??? folic acid (FOLVITE) 1 mg tablet TK 1 T PO QD  3   ??? furosemide (LASIX) 40 mg tablet TAKE ONE-HALF TABLET PO PRF FLUID OR EDEMA  9   ??? omeprazole (PRILOSEC) 20 mg capsule Take 20 mg by mouth daily.     ??? fluticasone (FLONASE) 50 mcg/actuation nasal spray 2 Sprays by Both Nostrils route daily.     ??? loratadine (CLARITIN) 10 mg tablet Take 10 mg by mouth.     ??? multivitamin (ONE A DAY) tablet Take 1 Tab by mouth daily.     ??? VIT A/VIT C/VIT E/ZINC/COPPER (PRESERVISION AREDS PO) Take  by mouth.     ??? magnesium oxide (MAG-OX) 400 mg tablet Take 400 mg by mouth daily.     ??? baclofen 5 mg tab Take  by mouth.     ??? predniSONE (DELTASONE) 10 mg tablet Take  by mouth daily (with breakfast).     ??? amoxicillin (AMOXIL) 500 mg capsule Take 500 mg by mouth.     ??? finasteride (PROSCAR) 5 mg tablet Take 1 Tab by mouth daily. 90 Tab 3   ??? mirabegron ER (MYRBETRIQ) 50 mg ER tablet Take 1 Tab by mouth daily. 90 Tab 3   ??? tamsulosin (FLOMAX) 0.4 mg capsule Take 1 Cap by mouth daily. 90 Cap 3   ??? hydrocortisone (CORTIZONE) 0.5 % ointment Apply  to affected area two (2) times a day. use thin layer     ??? polyethylene glycol (MIRALAX) 17 gram packet Take 17 g by mouth daily.     ??? senna-docusate  (SENOKOT-S) 8.6-50 mg per tablet Take 1 Tab by mouth daily.     ??? ferrous sulfate 325 mg (65 mg iron) cpER Take  by mouth.       No current facility-administered medications on file prior to visit.         ROS all systems reviewed and negative    Visit Vitals  BP 110/64 (BP 1 Location: Left arm, BP Patient Position: Sitting)   Temp 97.7 ??F (36.5 ??C)   Resp 20   Ht 5\' 8"  (1.727 m)   Wt 146 lb 6.4 oz (66.4 kg)   BMI 22.26 kg/m??        Physical Exam well-developed pleasantly confused Caucasian man alert and oriented no acute distress normocephalic wearing a mask conjunctiva pink neck supple no lymphadenopathy lungs clear to auscultation bilaterally heart sounds regular today extremities trace ankle edema bilaterally.  We will call him the azelastine spray for him and see him routinely in 3 months  Assessment & Plan as noted above

## 2020-01-13 ENCOUNTER — Ambulatory Visit: Attending: Urology | Primary: Family Medicine

## 2020-01-13 ENCOUNTER — Encounter: Payer: MEDICARE | Attending: Urology | Primary: Internal Medicine

## 2020-01-13 ENCOUNTER — Ambulatory Visit: Admit: 2020-01-13 | Discharge: 2020-01-13 | Attending: Urology | Primary: Internal Medicine

## 2020-01-13 DIAGNOSIS — N401 Enlarged prostate with lower urinary tract symptoms: Secondary | ICD-10-CM

## 2020-01-13 LAB — AMB POC URINALYSIS DIP STICK AUTO W/O MICRO
Bilirubin (UA POC): NEGATIVE
Bilirubin, Urine, POC: NEGATIVE
Blood (UA POC): NEGATIVE
Blood (UA POC): NEGATIVE
Glucose (UA POC): NEGATIVE
Glucose, Urine, POC: NEGATIVE
Ketones (UA POC): NEGATIVE
Ketones, Urine, POC: NEGATIVE
Leukocyte Esterase, Urine, POC: NEGATIVE
Leukocyte esterase (UA POC): NEGATIVE
Nitrite, Urine, POC: NEGATIVE
Nitrites (UA POC): NEGATIVE
Specific Gravity, Urine, POC: 1.02 NA (ref 1.001–1.035)
Specific gravity (UA POC): 1.02 (ref 1.001–1.035)
Urobilinogen (UA POC): 0.2 (ref 0.2–1)
Urobilinogen, POC: 0.2 (ref 0.2–1)
pH (UA POC): 7 (ref 4.6–8.0)
pH, Urine, POC: 7 NA (ref 4.6–8.0)

## 2020-01-13 LAB — AMB POC PVR, MEAS,POST-VOID RES,US,NON-IMAGING
PVR POC: 100 cc
PVR: 100 cc

## 2020-01-13 MED ORDER — SILODOSIN 8 MG CAPSULE
8 mg | ORAL_CAPSULE | Freq: Every day | ORAL | 3 refills | Status: DC
Start: 2020-01-13 — End: 2020-02-04

## 2020-01-13 NOTE — Progress Notes (Signed)
Follow-up Visit           IMP:     ICD-10-CM ICD-9-CM    1. Benign non-nodular prostatic hyperplasia with lower urinary tract symptoms  N40.1 600.91 AMB POC URINALYSIS DIP STICK AUTO W/O MICRO      AMB POC PVR, MEAS,POST-VOID RES,US,NON-IMAGING   2. OAB (overactive bladder)  N32.81 596.51 AMB POC URINALYSIS DIP STICK AUTO W/O MICRO      AMB POC PVR, MEAS,POST-VOID RES,US,NON-IMAGING                 PLAN:   ?? PVR today is 100 cc  ?? UA today is negative  ?? Continue Myrbetriq 50 mg daily  ?? Continue Proscar 5 mg daily.   ?? Start Rapaflo 8 mg daily. SE and dosing reviewed. Rx sent.   ?? Recommend taking his Lasix around 3 PM to help reduce nocturia.       Follow-up and Dispositions    Return in about 1 year (around 01/12/2021) for PVR, Reassess symptoms.              Chief Complaint   Patient presents with   ??? Urinary Frequency        HPI: Matthew Chandler is a 86 y.o. WHITE male  who presents today in follow up for an established diagnosis of BPH.     He is s/p UroLift 05/14/16. He had post op voiding trial on 06/03/16. He failed to voiding trial post op. Passed VT on 06/03/2016. Patient is doing well today. At last visit on 07/30/2018, patient discontinued his Flomax and started Myrbetriq. Continues on Proscar 5 mg daily and Myrbetriq 50 mg daily. Patient's family member reports he continue to have some urinary frequency and urgency. Nocturia 4-5x. Notes he is on Lasix and has some frequency after he takes it. Denies flank pain, gross hematuria, dysuria, and is asymptomatic for infection today. No f/c/n/v.   ??  Social hx: After last visit, patient moved back to [place], [place]. Now lives in [place], [place].   ??  Pre Op:  Work up included AUA score in the severe range with refractory retention. UDS confirmed obstruction with some bladder function TRUS Vol: ~80 gms.        AUA Assessment Score:  AUA Score: 28;    AUA Bother Rating: Mixed-about equally satisfied      Current Outpatient Medications   Medication Sig Dispense Refill    ??? multivit-min/FA/lycopen/lutein (CERTAVITE SENIOR PO) Take  by mouth.     ??? silodosin (RAPAFLO) 8 mg capsule Take 1 Cap by mouth daily (with dinner). 90 Cap 3   ??? cholecalciferol, vitamin D3, 50 mcg (2,000 unit) tab Take 1 Tab by mouth daily.     ??? azelastine (ASTELIN) 137 mcg (0.1 %) nasal spray 1 Spray by Both Nostrils route two (2) times a day. Use in each nostril as directed 1 Bottle 5   ??? baclofen 5 mg tab Take  by mouth.     ??? traMADoL (ULTRAM) 50 mg tablet Take 50 mg by mouth every six (6) hours as needed for Pain.     ??? tiZANidine (ZANAFLEX) 2 mg tablet Take  by mouth.     ??? predniSONE (DELTASONE) 10 mg tablet Take  by mouth daily (with breakfast).     ??? azelastine (ASTELIN) 137 mcg (0.1 %) nasal spray 1 Spray two (2) times a day. Use in each nostril as directed     ??? amoxicillin (AMOXIL) 500 mg capsule  Take 500 mg by mouth.     ??? ammonium lactate (AMLACTIN) 12 % topical cream Apply  to affected area two (2) times a day. rub in to affected area well     ??? nitroglycerin (NITROSTAT) 0.4 mg SL tablet 0.4 mg by SubLINGual route every five (5) minutes as needed for Chest Pain. Up to 3 doses.     ??? mirabegron ER (MYRBETRIQ) 50 mg ER tablet Take 1 Tab by mouth daily. 90 Tab 3   ??? finasteride (PROSCAR) 5 mg tablet Take 1 Tab by mouth daily. 90 Tab 3   ??? mirabegron ER (MYRBETRIQ) 50 mg ER tablet Take 1 Tab by mouth daily. 90 Tab 3   ??? apixaban (ELIQUIS) 2.5 mg tablet 2.5 mg.     ??? citalopram (CELEXA) 10 mg tablet TK 1 T PO QD IN THE MORNING  11   ??? finasteride (PROSCAR) 5 mg tablet TK 1 T PO QD  3   ??? folic acid (FOLVITE) 1 mg tablet TK 1 T PO QD  3   ??? furosemide (LASIX) 40 mg tablet TAKE ONE-HALF TABLET PO PRF FLUID OR EDEMA  9   ??? omeprazole (PRILOSEC) 20 mg capsule Take 20 mg by mouth daily.     ??? hydrocortisone (CORTIZONE) 0.5 % ointment Apply  to affected area two (2) times a day. use thin layer     ??? polyethylene glycol (MIRALAX) 17 gram packet Take 17 g by mouth daily.     ??? senna-docusate (SENOKOT-S)  8.6-50 mg per tablet Take 1 Tab by mouth daily.     ??? fluticasone (FLONASE) 50 mcg/actuation nasal spray 2 Sprays by Both Nostrils route daily.     ??? loratadine (CLARITIN) 10 mg tablet Take 10 mg by mouth.     ??? multivitamin (ONE A DAY) tablet Take 1 Tab by mouth daily.     ??? VIT A/VIT C/VIT E/ZINC/COPPER (PRESERVISION AREDS PO) Take  by mouth.     ??? magnesium oxide (MAG-OX) 400 mg tablet Take 400 mg by mouth daily.     ??? ferrous sulfate 325 mg (65 mg iron) cpER Take  by mouth.         All:  Allergies   Allergen Reactions   ??? Statins-Hmg-Coa Reductase Inhibitors Unknown (comments)       Past Medical History:   Diagnosis Date   ??? Anemia, unspecified    ??? Aortic valve replaced    ??? Bacteremia 12/2015    MSSA   ??? Benign non-nodular prostatic hyperplasia with lower urinary tract symptoms    ??? Benign prostatic hyperplasia with lower urinary tract symptoms    ??? Chronic diastolic heart failure ([place])    ??? Chronic idiopathic constipation    ??? Generalized edema    ??? GERD without esophagitis    ??? Gross hematuria    ??? History of aortic valve replacement with bioprosthetic valve    ??? History of depression    ??? 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 Hypertension, essential 11/12/2017   ??? Hypokalemia    ??? Hyponatremia with excess extracellular fluid volume    ??? Long-term use of high-risk medication    ??? Muscle cramps    ??? Muscular deconditioning    ??? Reactive depression    ??? Spinal stenosis of lumbar region    ??? Typical atrial flutter ([place])    ??? Unspecified osteoarthritis, unspecified site 03/03/2019   ??? Urinary retention    ??? Urinary  urgency        Past Surgical History:   Procedure Laterality Date   ??? HX APPENDECTOMY     ??? HX CATARACT REMOVAL     ??? HX CORONARY ARTERY BYPASS GRAFT  05/2013   ??? HX OTHER SURGICAL      NECK SURGERY   ??? HX TONSILLECTOMY     ??? HX UROLOGICAL  05/14/2016    [facility] - Cystoscopy and UroLift (transprostatic implants #10), Dr Asher Muir         Review of Systems  Constitutional: Fever: No   Skin: Rash: No  HEENT: Hearing difficulty: Yes  Eyes: Blurred vision: No  Cardiovascular: Chest pain: No  Respiratory: Shortness of breath: No  Gastrointestinal: Nausea/vomiting: No  Musculoskeletal: Back pain: No  Neurological: Weakness: No  Psychological: Memory loss: No  Comments/additional findings:       PHYSICAL EXAM:      Visit Vitals  Ht 5\' 8"  (1.727 m)   Wt 143 lb (64.9 kg)   BMI 21.74 kg/m??     Constitutional: WDWN, Pleasant and appropriate affect, No acute distress.  CV:  No peripheral swelling noted  Neuro/Psych:  Alert and Oriented x3, affect appropriate.      Lab:  Results for orders placed or performed in visit on 01/13/20   AMB POC URINALYSIS DIP STICK AUTO W/O MICRO   Result Value Ref Range    Color (UA POC) Yellow     Clarity (UA POC) Clear     Glucose (UA POC) Negative Negative    Bilirubin (UA POC) Negative Negative    Ketones (UA POC) Negative Negative    Specific gravity (UA POC) 1.020 1.001 - 1.035    Blood (UA POC) Negative Negative    pH (UA POC) 7.0 4.6 - 8.0    Protein (UA POC) 2+ Negative    Urobilinogen (UA POC) 0.2 mg/dL 0.2 - 1    Nitrites (UA POC) Negative Negative    Leukocyte esterase (UA POC) Negative Negative   AMB POC PVR, MEAS,POST-VOID RES,US,NON-IMAGING   Result Value Ref Range    PVR 100 cc        No results found for: PSA, 01/15/20, Pollie Friar, [id number]    [id number], MD      Documentation provided by Lurlean Nanny, medical scribe for Iona Coach, MD

## 2020-01-13 NOTE — Progress Notes (Signed)
Progress  Notes by Arnette Schaumann, MD at 01/13/20 1415                Author: Arnette Schaumann, MD  Service: --  Author Type: Physician       Filed: 02/02/20 1324  Encounter Date: 01/13/2020  Status: Signed          Editor: Arnette Schaumann, MD (Physician)                       Follow-up Visit                IMP:              ICD-10-CM  ICD-9-CM             1.  Benign non-nodular prostatic hyperplasia with lower urinary tract symptoms   N40.1  600.91  AMB POC URINALYSIS DIP STICK AUTO W/O MICRO                AMB POC PVR, MEAS,POST-VOID RES,US,NON-IMAGING           2.  OAB (overactive bladder)   N32.81  596.51  AMB POC URINALYSIS DIP STICK AUTO W/O MICRO                AMB POC PVR, MEAS,POST-VOID RES,US,NON-IMAGING                          PLAN:    ??  PVR today is 100 cc   ??  UA today is negative   ??  Continue Myrbetriq 50 mg daily   ??  Continue Proscar 5 mg daily.    ??  Start Rapaflo 8 mg daily. SE and dosing reviewed. Rx sent.    ??  Recommend taking his Lasix around 3 PM to help reduce nocturia.            Follow-up and Dispositions       Return in about 1 year (around 01/12/2021) for PVR, Reassess symptoms.                          Chief Complaint       Patient presents with        ?  Urinary Frequency            HPI: Matthew Chandler  is a 86 y.o. WHITE  male  who presents today in follow up for an established diagnosis of BPH.       He is s/p UroLift 05/14/16. He had post op voiding trial on 06/03/16. He failed to voiding trial post op. Passed VT on 06/03/2016. Patient is doing well today. At last visit on 07/30/2018, patient discontinued his Flomax and started Myrbetriq. Continues on  Proscar 5 mg daily and Myrbetriq 50 mg daily. Patient's family member reports he continue to have some urinary frequency and urgency. Nocturia 4-5x. Notes he is on Lasix and has some frequency after he takes it. Denies flank pain, gross hematuria, dysuria,  and is asymptomatic for infection today. No f/c/n/v.    ??   Social hx: After last  visit, patient moved back to [place], [place]. Now lives in [place], [place].    ??   Pre Op:   Work up included AUA score in the severe range with refractory retention. UDS confirmed obstruction with some bladder function TRUS Vol: ~80 gms.  AUA Assessment Score:   AUA Score: 28;     AUA Bother Rating: Mixed-about equally satisfied           Current Outpatient Medications          Medication  Sig  Dispense  Refill           ?  multivit-min/FA/lycopen/lutein (CERTAVITE SENIOR PO)  Take  by mouth.         ?  silodosin (RAPAFLO) 8 mg capsule  Take 1 Cap by mouth daily (with dinner).  90 Cap  3     ?  cholecalciferol, vitamin D3, 50 mcg (2,000 unit) tab  Take 1 Tab by mouth daily.         ?  azelastine (ASTELIN) 137 mcg (0.1 %) nasal spray  1 Spray by Both Nostrils route two (2) times a day. Use in each nostril as directed  1 Bottle  5     ?  baclofen 5 mg tab  Take  by mouth.         ?  traMADoL (ULTRAM) 50 mg tablet  Take 50 mg by mouth every six (6) hours as needed for Pain.         ?  tiZANidine (ZANAFLEX) 2 mg tablet  Take  by mouth.         ?  predniSONE (DELTASONE) 10 mg tablet  Take  by mouth daily (with breakfast).         ?  azelastine (ASTELIN) 137 mcg (0.1 %) nasal spray  1 Spray two (2) times a day. Use in each nostril as directed         ?  amoxicillin (AMOXIL) 500 mg capsule  Take 500 mg by mouth.         ?  ammonium lactate (AMLACTIN) 12 % topical cream  Apply  to affected area two (2) times a day. rub in to affected area well         ?  nitroglycerin (NITROSTAT) 0.4 mg SL tablet  0.4 mg by SubLINGual route every five (5) minutes as needed for Chest Pain. Up to 3 doses.         ?  mirabegron ER (MYRBETRIQ) 50 mg ER tablet  Take 1 Tab by mouth daily.  90 Tab  3     ?  finasteride (PROSCAR) 5 mg tablet  Take 1 Tab by mouth daily.  90 Tab  3     ?  mirabegron ER (MYRBETRIQ) 50 mg ER tablet  Take 1 Tab by mouth daily.  90 Tab  3     ?  apixaban (ELIQUIS) 2.5 mg tablet  2.5 mg.         ?  citalopram  (CELEXA) 10 mg tablet  TK 1 T PO QD IN THE MORNING    11     ?  finasteride (PROSCAR) 5 mg tablet  TK 1 T PO QD    3     ?  folic acid (FOLVITE) 1 mg tablet  TK 1 T PO QD    3     ?  furosemide (LASIX) 40 mg tablet  TAKE ONE-HALF TABLET PO PRF FLUID OR EDEMA    9     ?  omeprazole (PRILOSEC) 20 mg capsule  Take 20 mg by mouth daily.         ?  hydrocortisone (CORTIZONE) 0.5 % ointment  Apply  to affected area two (2) times a day. use  thin layer         ?  polyethylene glycol (MIRALAX) 17 gram packet  Take 17 g by mouth daily.         ?  senna-docusate (SENOKOT-S) 8.6-50 mg per tablet  Take 1 Tab by mouth daily.         ?  fluticasone (FLONASE) 50 mcg/actuation nasal spray  2 Sprays by Both Nostrils route daily.         ?  loratadine (CLARITIN) 10 mg tablet  Take 10 mg by mouth.         ?  multivitamin (ONE A DAY) tablet  Take 1 Tab by mouth daily.         ?  VIT A/VIT C/VIT E/ZINC/COPPER (PRESERVISION AREDS PO)  Take  by mouth.         ?  magnesium oxide (MAG-OX) 400 mg tablet  Take 400 mg by mouth daily.               ?  ferrous sulfate 325 mg (65 mg iron) cpER  Take  by mouth.               All:     Allergies        Allergen  Reactions         ?  Statins-Hmg-Coa Reductase Inhibitors  Unknown (comments)             Past Medical History:        Diagnosis  Date         ?  Anemia, unspecified       ?  Aortic valve replaced       ?  Bacteremia  12/2015          MSSA         ?  Benign non-nodular prostatic hyperplasia with lower urinary tract symptoms       ?  Benign prostatic hyperplasia with lower urinary tract symptoms       ?  Chronic diastolic heart failure ([facility])       ?  Chronic idiopathic constipation       ?  Generalized edema       ?  GERD without esophagitis       ?  Gross hematuria       ?  History of aortic valve replacement with bioprosthetic valve       ?  History of depression       ?  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  Hypertension, essential  11/12/2017     ?  Hypokalemia        ?  Hyponatremia with excess extracellular fluid volume       ?  Long-term use of high-risk medication           ?  Muscle cramps           ?  Muscular deconditioning       ?  Reactive depression       ?  Spinal stenosis of lumbar region       ?  Typical atrial flutter ([facility])       ?  Unspecified osteoarthritis, unspecified site  03/03/2019     ?  Urinary retention           ?  Urinary urgency               Past Surgical History:  Procedure  Laterality  Date          ?  HX APPENDECTOMY         ?  HX CATARACT REMOVAL         ?  HX CORONARY ARTERY BYPASS GRAFT    05/2013     ?  HX OTHER SURGICAL              NECK SURGERY          ?  HX TONSILLECTOMY         ?  HX UROLOGICAL    05/14/2016          [facility] - Cystoscopy and UroLift (transprostatic implants #10), Dr Asher Muir              Review of Systems   Constitutional: Fever: No   Skin: Rash: No   HEENT: Hearing difficulty: Yes   Eyes: Blurred vision: No   Cardiovascular: Chest pain: No   Respiratory: Shortness of breath: No   Gastrointestinal: Nausea/vomiting: No   Musculoskeletal: Back pain: No   Neurological: Weakness: No   Psychological: Memory loss: No   Comments/additional findings:          PHYSICAL EXAM:        Visit Vitals      Ht  5\' 8"  (1.727 m)     Wt  143 lb (64.9 kg)        BMI  21.74 kg/m??        Constitutional: WDWN, Pleasant and appropriate affect, No acute distress.   CV:  No peripheral swelling noted   Neuro/Psych:  Alert and Oriented x3, affect appropriate.        Lab:     Results for orders placed or performed in visit on 01/13/20     AMB POC URINALYSIS DIP STICK AUTO W/O MICRO         Result  Value  Ref Range            Color (UA POC)  Yellow         Clarity (UA POC)  Clear         Glucose (UA POC)  Negative  Negative       Bilirubin (UA POC)  Negative  Negative       Ketones (UA POC)  Negative  Negative       Specific gravity (UA POC)  1.020  1.001 - 1.035       Blood (UA POC)  Negative  Negative       pH (UA POC)  7.0  4.6 - 8.0        Protein (UA POC)  2+  Negative       Urobilinogen (UA POC)  0.2 mg/dL  0.2 - 1       Nitrites (UA POC)  Negative  Negative       Leukocyte esterase (UA POC)  Negative  Negative       AMB POC PVR, MEAS,POST-VOID RES,US,NON-IMAGING         Result  Value  Ref Range            PVR  100  cc            No results found for: PSA, 01/15/20, Pollie Friar, [id number]      [id number], MD         Documentation provided by Lurlean Nanny, medical scribe for Iona Coach, MD

## 2020-01-17 NOTE — Telephone Encounter (Signed)
Recd call from BJ at  [facility] for pt. Dr wrote medication  silodosin (RAPAFLO) 8 mg capsule     Per Bj states that  Insurance will not cover that med can please write a script for  Tamsulosin 0.4 mgs to the pharmacy on file.     Please assist      Thanks a

## 2020-01-18 NOTE — Telephone Encounter (Signed)
BJ from [facility] called in and stated that silodosin (RAPAFLO) is not covered by the pt's insurance and asked for rx for Tamsulosin be sent to [facility] [place], [place] - [address]

## 2020-01-19 ENCOUNTER — Encounter: Payer: MEDICARE | Attending: Internal Medicine | Primary: Internal Medicine

## 2020-01-19 NOTE — Telephone Encounter (Signed)
Left vm for return call.

## 2020-01-19 NOTE — Telephone Encounter (Signed)
Bj called from [facility] again saying she hadn't received a call about the pt meds. I spoke with Cj that advise me that the call was returned yesterday at 5pm and that Flomax was not good for the pt due to age, but Eren[facility] Rasher and [facility] has the meds with [facility] for $46 for 90 day supply. I did advise Bj of the info given to me and she said she will call pt daughter and see what she would like to do. Bj said thanks and the call was disconnected.

## 2020-01-20 NOTE — Telephone Encounter (Signed)
Left vm for return call.

## 2020-01-20 NOTE — Telephone Encounter (Signed)
Pt's daughter ([place] verified) called stating she can pay for the original med out of pocket. She is looking for a call back.     Please assist.     Good number for her: [phone]

## 2020-01-21 ENCOUNTER — Encounter: Attending: Internal Medicine | Primary: Internal Medicine

## 2020-01-25 NOTE — Telephone Encounter (Signed)
Nurse from a facility called to inform us that the pt need a alternate med for Rapaflo because the pt insurance will not cover it. Please assist.    Thanks

## 2020-01-25 NOTE — Telephone Encounter (Signed)
Spoke with Leigh on [facility], pt needs a PA for medication as he can not take Flomax due to age and possible SE.

## 2020-01-26 NOTE — Telephone Encounter (Signed)
Spoke with nurse, Advised per JAG Flomax can cause lowered BP,Will initiate PA and return call with response

## 2020-02-01 ENCOUNTER — Encounter: Payer: MEDICARE | Attending: Internal Medicine | Primary: Internal Medicine

## 2020-02-02 ENCOUNTER — Ambulatory Visit: Attending: Internal Medicine | Primary: Family Medicine

## 2020-02-02 ENCOUNTER — Ambulatory Visit: Admit: 2020-02-02 | Discharge: 2020-02-02 | Payer: MEDICARE | Attending: Internal Medicine | Primary: Internal Medicine

## 2020-02-02 DIAGNOSIS — I10 Essential (primary) hypertension: Secondary | ICD-10-CM

## 2020-02-02 NOTE — Telephone Encounter (Signed)
BJ from [facility] called in and asked for a call back in reference to PA for Rapaflo and asked for PA to be faxed to [phone].

## 2020-02-02 NOTE — Progress Notes (Signed)
Matthew Chandler is a 86 y.o. male presenting for hypertension follow-up          Chief Complaint   Patient presents with   ??? Hypertension        HPI Matthew Chandler is in today with his ever attentive daughter who takes outstanding care of them.  It seems that some of the restrictions for [facility] has been lifted and she has been able to get in to see him in person for the first time in almost a year.  He has been doing very well and has been making the rounds of his multiple physicians.  He saw his cardiologist recently and will be seeing him again in about 6 weeks.  Everything seemed to be stable lower but he encouraged him to continue walking and I agree with that.  He sees Dr. Buckley and he sees the eye doctor for his macular degeneration.  Recently he has had more trouble with bladder leakage and Dr. Ewer is trying to get Rapaflo approved and they are still going through there is a prior authorization process which apparently has been particularly difficult.  He generally feels well and is trying to stay active.  He is enjoying life at [place] and seems to be quite chipper today.    Past Medical History:   Diagnosis Date   ??? Anemia, unspecified    ??? Aortic valve replaced    ??? Bacteremia 12/2015    MSSA   ??? Benign non-nodular prostatic hyperplasia with lower urinary tract symptoms    ??? Benign prostatic hyperplasia with lower urinary tract symptoms    ??? Chronic diastolic heart failure ([facility])    ??? Chronic idiopathic constipation    ??? Generalized edema    ??? GERD without esophagitis    ??? Gross hematuria    ??? History of aortic valve replacement with bioprosthetic valve    ??? History of depression    ??? 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 Hypertension, essential 11/12/2017   ??? Hypokalemia    ??? Hyponatremia with excess extracellular fluid volume    ??? Long-term use of high-risk medication    ??? Muscle cramps    ??? Muscular deconditioning    ??? Reactive depression    ??? Spinal stenosis of lumbar region     ??? Typical atrial flutter ([facility])    ??? Unspecified osteoarthritis, unspecified site 03/03/2019   ??? Urinary retention    ??? Urinary urgency         Current Outpatient Medications on File Prior to Visit   Medication Sig Dispense Refill   ??? multivit-min/FA/lycopen/lutein (CERTAVITE SENIOR PO) Take  by mouth.     ??? silodosin (RAPAFLO) 8 mg capsule Take 1 Cap by mouth daily (with dinner). 90 Cap 3   ??? cholecalciferol, vitamin D3, 50 mcg (2,000 unit) tab Take 1 Tab by mouth daily.     ??? azelastine (ASTELIN) 137 mcg (0.1 %) nasal spray 1 Spray by Both Nostrils route two (2) times a day. Use in each nostril as directed 1 Bottle 5   ??? baclofen 5 mg tab Take  by mouth.     ??? traMADoL (ULTRAM) 50 mg tablet Take 50 mg by mouth every six (6) hours as needed for Pain.     ??? tiZANidine (ZANAFLEX) 2 mg tablet Take  by mouth.     ??? predniSONE (DELTASONE) 10 mg tablet Take  by mouth daily (with breakfast).     ???   azelastine (ASTELIN) 137 mcg (0.1 %) nasal spray 1 Spray two (2) times a day. Use in each nostril as directed     ??? amoxicillin (AMOXIL) 500 mg capsule Take 500 mg by mouth.     ??? ammonium lactate (AMLACTIN) 12 % topical cream Apply  to affected area two (2) times a day. rub in to affected area well     ??? nitroglycerin (NITROSTAT) 0.4 mg SL tablet 0.4 mg by SubLINGual route every five (5) minutes as needed for Chest Pain. Up to 3 doses.     ??? mirabegron ER (MYRBETRIQ) 50 mg ER tablet Take 1 Tab by mouth daily. 90 Tab 3   ??? finasteride (PROSCAR) 5 mg tablet Take 1 Tab by mouth daily. 90 Tab 3   ??? mirabegron ER (MYRBETRIQ) 50 mg ER tablet Take 1 Tab by mouth daily. 90 Tab 3   ??? apixaban (ELIQUIS) 2.5 mg tablet 2.5 mg.     ??? citalopram (CELEXA) 10 mg tablet TK 1 T PO QD IN THE MORNING  11   ??? finasteride (PROSCAR) 5 mg tablet TK 1 T PO QD  3   ??? folic acid (FOLVITE) 1 mg tablet TK 1 T PO QD  3   ??? furosemide (LASIX) 40 mg tablet TAKE ONE-HALF TABLET PO PRF FLUID OR EDEMA  9   ??? omeprazole (PRILOSEC) 20 mg capsule Take 20 mg by mouth  daily.     ??? hydrocortisone (CORTIZONE) 0.5 % ointment Apply  to affected area two (2) times a day. use thin layer     ??? polyethylene glycol (MIRALAX) 17 gram packet Take 17 g by mouth daily.     ??? senna-docusate (SENOKOT-S) 8.6-50 mg per tablet Take 1 Tab by mouth daily.     ??? fluticasone (FLONASE) 50 mcg/actuation nasal spray 2 Sprays by Both Nostrils route daily.     ??? loratadine (CLARITIN) 10 mg tablet Take 10 mg by mouth.     ??? multivitamin (ONE A DAY) tablet Take 1 Tab by mouth daily.     ??? VIT A/VIT C/VIT E/ZINC/COPPER (PRESERVISION AREDS PO) Take  by mouth.     ??? magnesium oxide (MAG-OX) 400 mg tablet Take 400 mg by mouth daily.     ??? ferrous sulfate 325 mg (65 mg iron) cpER Take  by mouth.       No current facility-administered medications on file prior to visit.         ROS all systems are reviewed and negative except as noted.    There were no vitals taken for this visit.     Physical Exam well-developed elderly Caucasian male alert oriented cooperative no acute distress normocephalic conjunctiva pink neck no lymphadenopathy lungs bilaterally clear to auscultation heart irregular rhythm no murmurs or extrasystoles no gallops trace of ankle edema  Assessment & Plan I reviewed all of his present medicines and did not try to change anything.  I will see him routinely in 6 months and his daughter knows to call for problems

## 2020-02-02 NOTE — Progress Notes (Signed)
Matthew Chandler is a 86 y.o. male presenting for hypertension follow-up          Chief Complaint   Patient presents with   ??? Hypertension        HPI Mr. Engert is in today with his ever attentive daughter who takes outstanding care of them.  It seems that some of the restrictions for the [facility] has been lifted and she has been able to get in to see him in person for the first time in almost a year.  He has been doing very well and has been making the rounds of his multiple physicians.  He saw his cardiologist recently and will be seeing him again in about 6 weeks.  Everything seemed to be stable lower but he encouraged him to continue walking and I agree with that.  He sees Dr. Hetty Blend and he sees the eye doctor for his macular degeneration.  Recently he has had more trouble with bladder leakage and Dr. Archer Asa is trying to get Rapaflo approved and they are still going through there is a prior authorization process which apparently has been particularly difficult.  He generally feels well and is trying to stay active.  He is enjoying life at the [facility] and seems to be quite chipper today.    Past Medical History:   Diagnosis Date   ??? Anemia, unspecified    ??? Aortic valve replaced    ??? Bacteremia 12/2015    MSSA   ??? Benign non-nodular prostatic hyperplasia with lower urinary tract symptoms    ??? Benign prostatic hyperplasia with lower urinary tract symptoms    ??? Chronic diastolic heart failure (HCC)    ??? Chronic idiopathic constipation    ??? Generalized edema    ??? GERD without esophagitis    ??? Gross hematuria    ??? History of aortic valve replacement with bioprosthetic valve    ??? History of depression    ??? 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 Hypertension, essential 11/12/2017   ??? Hypokalemia    ??? Hyponatremia with excess extracellular fluid volume    ??? Long-term use of high-risk medication    ??? Muscle cramps    ??? Muscular deconditioning    ??? Reactive depression    ??? Spinal stenosis of lumbar region     ??? Typical atrial flutter (HCC)    ??? Unspecified osteoarthritis, unspecified site 03/03/2019   ??? Urinary retention    ??? Urinary urgency         Current Outpatient Medications on File Prior to Visit   Medication Sig Dispense Refill   ??? multivit-min/FA/lycopen/lutein (CERTAVITE SENIOR PO) Take  by mouth.     ??? silodosin (RAPAFLO) 8 mg capsule Take 1 Cap by mouth daily (with dinner). 90 Cap 3   ??? cholecalciferol, vitamin D3, 50 mcg (2,000 unit) tab Take 1 Tab by mouth daily.     ??? azelastine (ASTELIN) 137 mcg (0.1 %) nasal spray 1 Spray by Both Nostrils route two (2) times a day. Use in each nostril as directed 1 Bottle 5   ??? baclofen 5 mg tab Take  by mouth.     ??? traMADoL (ULTRAM) 50 mg tablet Take 50 mg by mouth every six (6) hours as needed for Pain.     ??? tiZANidine (ZANAFLEX) 2 mg tablet Take  by mouth.     ??? predniSONE (DELTASONE) 10 mg tablet Take  by mouth daily (with breakfast).     ???  azelastine (ASTELIN) 137 mcg (0.1 %) nasal spray 1 Spray two (2) times a day. Use in each nostril as directed     ??? amoxicillin (AMOXIL) 500 mg capsule Take 500 mg by mouth.     ??? ammonium lactate (AMLACTIN) 12 % topical cream Apply  to affected area two (2) times a day. rub in to affected area well     ??? nitroglycerin (NITROSTAT) 0.4 mg SL tablet 0.4 mg by SubLINGual route every five (5) minutes as needed for Chest Pain. Up to 3 doses.     ??? mirabegron ER (MYRBETRIQ) 50 mg ER tablet Take 1 Tab by mouth daily. 90 Tab 3   ??? finasteride (PROSCAR) 5 mg tablet Take 1 Tab by mouth daily. 90 Tab 3   ??? mirabegron ER (MYRBETRIQ) 50 mg ER tablet Take 1 Tab by mouth daily. 90 Tab 3   ??? apixaban (ELIQUIS) 2.5 mg tablet 2.5 mg.     ??? citalopram (CELEXA) 10 mg tablet TK 1 T PO QD IN THE MORNING  11   ??? finasteride (PROSCAR) 5 mg tablet TK 1 T PO QD  3   ??? folic acid (FOLVITE) 1 mg tablet TK 1 T PO QD  3   ??? furosemide (LASIX) 40 mg tablet TAKE ONE-HALF TABLET PO PRF FLUID OR EDEMA  9   ??? omeprazole (PRILOSEC) 20 mg capsule Take 20 mg by mouth  daily.     ??? hydrocortisone (CORTIZONE) 0.5 % ointment Apply  to affected area two (2) times a day. use thin layer     ??? polyethylene glycol (MIRALAX) 17 gram packet Take 17 g by mouth daily.     ??? senna-docusate (SENOKOT-S) 8.6-50 mg per tablet Take 1 Tab by mouth daily.     ??? fluticasone (FLONASE) 50 mcg/actuation nasal spray 2 Sprays by Both Nostrils route daily.     ??? loratadine (CLARITIN) 10 mg tablet Take 10 mg by mouth.     ??? multivitamin (ONE A DAY) tablet Take 1 Tab by mouth daily.     ??? VIT A/VIT C/VIT E/ZINC/COPPER (PRESERVISION AREDS PO) Take  by mouth.     ??? magnesium oxide (MAG-OX) 400 mg tablet Take 400 mg by mouth daily.     ??? ferrous sulfate 325 mg (65 mg iron) cpER Take  by mouth.       No current facility-administered medications on file prior to visit.         ROS all systems are reviewed and negative except as noted.    There were no vitals taken for this visit.     Physical Exam well-developed elderly Caucasian male alert oriented cooperative no acute distress normocephalic conjunctiva pink neck no lymphadenopathy lungs bilaterally clear to auscultation heart irregular rhythm no murmurs or extrasystoles no gallops trace of ankle edema  Assessment & Plan I reviewed all of his present medicines and did not try to change anything.  I will see him routinely in 6 months and his daughter knows to call for problems

## 2020-02-04 MED ORDER — SILODOSIN 8 MG CAPSULE
8 mg | ORAL_CAPSULE | Freq: Every day | ORAL | 0 refills | Status: DC
Start: 2020-02-04 — End: 2020-02-07

## 2020-02-04 NOTE — Telephone Encounter (Signed)
Pt daughter informed PA was denied and there are no other alternatives due to pt age. Pt daughter is going to use [facility] and a new RX was sent. Denial Faxed to Facility.

## 2020-02-07 MED ORDER — SILODOSIN 8 MG CAPSULE
8 mg | ORAL_CAPSULE | Freq: Every day | ORAL | 0 refills | Status: DC
Start: 2020-02-07 — End: 2020-05-11

## 2020-02-07 NOTE — Telephone Encounter (Signed)
Pt daughter called in regards to the Rapaflo. The pharmacy has this medication on backorder. She requested the medication to be sent to [facility] in [place] city. The RX was sent . Pt daughter informed to call if she needs further assistance.

## 2020-03-01 NOTE — Telephone Encounter (Signed)
Received faxed notification form [employer] that prior auth for Silodosin 8 mg was denied.  To Appeal: may call [phone]  Insurance ID: [id number].  PA was denied, patient must have documented trial/failure or contraindication to at least 2 plan formulary medications.Patient has only tried 1.  Plan phone #: [phone].

## 2020-05-08 NOTE — Telephone Encounter (Signed)
Patient's caregiver called in requesting a refill for Rapaflo 8mg  to be sent to pharmacy on file.

## 2020-05-11 MED ORDER — SILODOSIN 8 MG CAPSULE
8 mg | ORAL_CAPSULE | Freq: Every day | ORAL | 3 refills | Status: DC
Start: 2020-05-11 — End: 2020-05-15

## 2020-05-11 NOTE — Telephone Encounter (Signed)
Pt daughter informed Rx sent to requested pharmacy

## 2020-05-11 NOTE — Telephone Encounter (Signed)
Pt's daughter called and stated that he needs refill on Silodosin and NOT rapaflo,,,,,,he still has not received anything and she is very concerned. Please contact daughter ar [phone] you

## 2020-05-15 MED ORDER — SILODOSIN 8 MG CAPSULE
8 mg | ORAL_CAPSULE | ORAL | 0 refills | Status: DC
Start: 2020-05-15 — End: 2021-07-10

## 2020-08-04 ENCOUNTER — Encounter: Attending: Internal Medicine | Primary: Internal Medicine

## 2020-08-29 ENCOUNTER — Ambulatory Visit: Attending: Internal Medicine | Primary: Family Medicine

## 2020-08-29 ENCOUNTER — Ambulatory Visit: Admit: 2020-08-29 | Discharge: 2020-08-29 | Payer: MEDICARE | Attending: Internal Medicine | Primary: Internal Medicine

## 2020-08-29 ENCOUNTER — Inpatient Hospital Stay: Admit: 2020-08-29 | Payer: MEDICARE | Primary: Internal Medicine

## 2020-08-29 DIAGNOSIS — I10 Essential (primary) hypertension: Secondary | ICD-10-CM

## 2020-08-29 NOTE — Progress Notes (Signed)
Matthew Chandler is a 86 y.o. male presenting for atrial flutter and prostatic symptoms.          Chief Complaint   Patient presents with   ??? Hypertension   ??? Coronary Artery Disease   ??? Irregular Heart Beat        HPI Matthew Chandler comes in today accompanied by his daughter.  He is generally doing about the same.  He complains of urinary frequency during the day and nocturia every hour during the night.  He has been seeing Dr. Archer Asa who did his UroLift procedure and he is on several prostate medications.  Does not seem to be doing a great deal of good and things are just more or less open ended with Dr. Elyn Peers as his daughter says they can call when they made him.  He has recently seen his cardiologist as well for some atypical chest pain which turned out to be chest wall related.  From a cardiac standpoint they feel like everything is stable and is atrial flutter is controlled and he is on his Eliquis daily.  Otherwise he is generally feeling well.  He is hearing his getting worse it seems and communication is more difficult.    Past Medical History:   Diagnosis Date   ??? Anemia, unspecified    ??? Aortic valve replaced    ??? Bacteremia 12/2015    MSSA   ??? Benign non-nodular prostatic hyperplasia with lower urinary tract symptoms    ??? Benign prostatic hyperplasia with lower urinary tract symptoms    ??? Chronic diastolic heart failure (HCC)    ??? Chronic idiopathic constipation    ??? Generalized edema    ??? GERD without esophagitis    ??? Gross hematuria    ??? History of aortic valve replacement with bioprosthetic valve    ??? History of depression    ??? 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 Hypertension, essential 11/12/2017   ??? Hypokalemia    ??? Hyponatremia with excess extracellular fluid volume    ??? Long-term use of high-risk medication    ??? Muscle cramps    ??? Muscular deconditioning    ??? Reactive depression    ??? Spinal stenosis of lumbar region    ??? Typical atrial flutter (HCC)    ??? Unspecified osteoarthritis,  unspecified site 03/03/2019   ??? Urinary retention    ??? Urinary urgency         Current Outpatient Medications on File Prior to Visit   Medication Sig Dispense Refill   ??? dextromethorphan (DELSYM) 30 mg/5 mL liquid Take 60 mg by mouth two (2) times a day.     ??? ibuprofen (MOTRIN) 200 mg tablet Take 3 Tablets by mouth every six (6) hours as needed.     ??? guaiFENesin-dextromethorphan SR (HUMIBID DM) 600-30 mg per tablet Take 1 Tablet by mouth two (2) times daily as needed.     ??? nitroglycerin (NITROSTAT) 0.4 mg SL tablet 0.4 mg by SubLINGual route every five (5) minutes as needed. Up to 3 doses.     ??? acetaminophen (TYLENOL) 500 mg tablet Take  by mouth every eight (8) hours as needed.     ??? silodosin (RAPAFLO) 8 mg capsule TAKE 1 CAPSULE BY MOUTH ONCE DAILY WITH  DINNER 90 Capsule 0   ??? multivit-min/FA/lycopen/lutein (CERTAVITE SENIOR PO) Take  by mouth.     ??? cholecalciferol, vitamin D3, 50 mcg (2,000 unit) tab Take 1 Tab by mouth daily.     ???  traMADoL (ULTRAM) 50 mg tablet Take 50 mg by mouth every six (6) hours as needed for Pain.     ??? azelastine (ASTELIN) 137 mcg (0.1 %) nasal spray 1 Spray two (2) times a day. Use in each nostril as directed     ??? amoxicillin (AMOXIL) 500 mg capsule Take 500 mg by mouth. FOR DENTAL PROCEDURE     ??? ammonium lactate (AMLACTIN) 12 % topical cream Apply  to affected area two (2) times a day. rub in to affected area well     ??? mirabegron ER (MYRBETRIQ) 50 mg ER tablet Take 1 Tab by mouth daily. 90 Tab 3   ??? apixaban (ELIQUIS) 2.5 mg tablet 2.5 mg.     ??? citalopram (CELEXA) 10 mg tablet TK 1 T PO QD IN THE MORNING  11   ??? finasteride (PROSCAR) 5 mg tablet TK 1 T PO QD  3   ??? folic acid (FOLVITE) 1 mg tablet TK 1 T PO QD  3   ??? furosemide (LASIX) 40 mg tablet TAKE ONE-HALF TABLET PO PRF FLUID OR EDEMA  9   ??? omeprazole (PRILOSEC) 20 mg capsule Take 20 mg by mouth daily.     ??? hydrocortisone (CORTIZONE) 0.5 % ointment Apply  to affected area two (2) times a day. use thin layer     ???  fluticasone (FLONASE) 50 mcg/actuation nasal spray 2 Sprays by Both Nostrils route daily.     ??? loratadine (CLARITIN) 10 mg tablet Take 10 mg by mouth.     ??? VIT A/VIT C/VIT E/ZINC/COPPER (PRESERVISION AREDS PO) Take  by mouth.     ??? magnesium oxide (MAG-OX) 400 mg tablet Take 400 mg by mouth daily.     ??? [DISCONTINUED] azelastine (ASTELIN) 137 mcg (0.1 %) nasal spray 1 Spray by Both Nostrils route two (2) times a day. Use in each nostril as directed 1 Bottle 5   ??? [DISCONTINUED] baclofen 5 mg tab Take  by mouth.     ??? [DISCONTINUED] tiZANidine (ZANAFLEX) 2 mg tablet Take  by mouth.     ??? [DISCONTINUED] predniSONE (DELTASONE) 10 mg tablet Take  by mouth daily (with breakfast).     ??? [DISCONTINUED] nitroglycerin (NITROSTAT) 0.4 mg SL tablet 0.4 mg by SubLINGual route every five (5) minutes as needed for Chest Pain. Up to 3 doses.     ??? [DISCONTINUED] finasteride (PROSCAR) 5 mg tablet Take 1 Tab by mouth daily. 90 Tab 3   ??? [DISCONTINUED] mirabegron ER (MYRBETRIQ) 50 mg ER tablet Take 1 Tab by mouth daily. 90 Tab 3   ??? [DISCONTINUED] polyethylene glycol (MIRALAX) 17 gram packet Take 17 g by mouth daily.     ??? [DISCONTINUED] senna-docusate (SENOKOT-S) 8.6-50 mg per tablet Take 1 Tab by mouth daily.     ??? [DISCONTINUED] multivitamin (ONE A DAY) tablet Take 1 Tab by mouth daily.     ??? [DISCONTINUED] ferrous sulfate 325 mg (65 mg iron) cpER Take  by mouth.       No current facility-administered medications on file prior to visit.        ROS all systems are reviewed and negative except as noted above    Visit Vitals  BP 120/60 (BP 1 Location: Right arm, BP Patient Position: Sitting, BP Cuff Size: Adult)   Resp 20   Ht 5\' 8"  (1.727 m)   Wt 145 lb (65.8 kg)   BMI 22.05 kg/m??        Physical Exam elderly frail  appearing Caucasian man alert and oriented no acute distress normocephalic conjunctiva pink neck no lymphadenopathy or mass lungs bilaterally clear to auscultation heart irregular rhythm no gallops occasional irregular  beats.  No edema    Assessment & Plan it looks like it has been over a year since Mr. Lichty had blood work and his cardiologist nor his urologist have checked any.  I am going to go ahead and order a CMP and a CBC and he will be scheduled for routine 44-month follow-up.        Melvenia Needles., MD

## 2020-08-30 ENCOUNTER — Inpatient Hospital Stay: Admit: 2020-08-30 | Payer: MEDICARE | Primary: Internal Medicine

## 2020-08-30 DIAGNOSIS — Z1389 Encounter for screening for other disorder: Secondary | ICD-10-CM

## 2020-08-30 LAB — CBC WITH AUTO DIFFERENTIAL
Basophils %: 1 % (ref 0–2)
Basophils Absolute: 0.1 10*3/uL (ref 0.0–0.1)
Eosinophils %: 6 % — ABNORMAL HIGH (ref 0–5)
Eosinophils Absolute: 0.6 10*3/uL — ABNORMAL HIGH (ref 0.0–0.4)
Granulocyte Absolute Count: 0 10*3/uL (ref 0.00–0.04)
Hematocrit: 36.6 % (ref 36.0–48.0)
Hemoglobin: 12.2 g/dL — ABNORMAL LOW (ref 13.0–16.0)
Immature Granulocytes: 0 % (ref 0–0.5)
Lymphocytes %: 23 % (ref 21–52)
Lymphocytes Absolute: 2 10*3/uL (ref 0.9–3.6)
MCH: 33.2 PG (ref 24.0–34.0)
MCHC: 33.3 g/dL (ref 31.0–37.0)
MCV: 99.5 FL (ref 78.0–100.0)
MPV: 10.2 FL (ref 9.2–11.8)
Monocytes %: 12 % — ABNORMAL HIGH (ref 3–10)
Monocytes Absolute: 1.1 10*3/uL (ref 0.05–1.2)
NRBC Absolute: 0 10*3/uL (ref 0.00–0.01)
Neutrophils %: 58 % (ref 40–73)
Neutrophils Absolute: 5.2 10*3/uL (ref 1.8–8.0)
Nucleated RBCs: 0 PER 100 WBC
Platelets: 195 10*3/uL (ref 135–420)
RBC: 3.68 M/uL — ABNORMAL LOW (ref 4.35–5.65)
RDW: 13.3 % (ref 11.6–14.5)
WBC: 9 10*3/uL (ref 4.6–13.2)

## 2020-08-30 LAB — COMPREHENSIVE METABOLIC PANEL
ALT: 18 U/L (ref 3–72)
AST: 20 U/L (ref 17–74)
Albumin/Globulin Ratio: 1.4
Albumin: 4.1 g/dL (ref 3.5–4.7)
Alkaline Phosphatase: 70 U/L (ref 38–126)
Anion Gap: 10 mmol/L
BUN: 31 mg/dL — ABNORMAL HIGH (ref 9–21)
Bun/Cre Ratio: 16
CO2: 28 mmol/L (ref 21–33)
Calcium: 8.8 mg/dL (ref 8.5–10.5)
Chloride: 99 mmol/L (ref 94–111)
Creatinine: 1.9 mg/dL — ABNORMAL HIGH (ref 0.8–1.50)
EGFR IF NonAfrican American: 33 mL/min/{1.73_m2}
GFR African American: 40 mL/min/{1.73_m2}
Globulin: 2.9 g/dL
Glucose: 88 mg/dL (ref 70–110)
Potassium: 4.4 mmol/L (ref 3.2–5.1)
Sodium: 137 mmol/L (ref 135–145)
Total Bilirubin: 0.8 mg/dL (ref 0.2–1.0)
Total Protein: 7 g/dL (ref 6.1–8.4)

## 2020-08-30 LAB — CBC WITH AUTOMATED DIFF
ABS. BASOPHILS: 0.1 10*3/uL (ref 0.0–0.1)
ABS. EOSINOPHILS: 0.6 10*3/uL — ABNORMAL HIGH (ref 0.0–0.4)
ABS. IMM. GRANS.: 0 10*3/uL (ref 0.00–0.04)
ABS. LYMPHOCYTES: 2 10*3/uL (ref 0.9–3.6)
ABS. MONOCYTES: 1.1 10*3/uL (ref 0.05–1.2)
ABS. NEUTROPHILS: 5.2 10*3/uL (ref 1.8–8.0)
ABSOLUTE NRBC: 0 10*3/uL (ref 0.00–0.01)
BASOPHILS: 1 % (ref 0–2)
EOSINOPHILS: 6 % — ABNORMAL HIGH (ref 0–5)
HCT: 36.6 % (ref 36.0–48.0)
HGB: 12.2 g/dL — ABNORMAL LOW (ref 13.0–16.0)
IMMATURE GRANULOCYTES: 0 % (ref 0–0.5)
LYMPHOCYTES: 23 % (ref 21–52)
MCH: 33.2 PG (ref 24.0–34.0)
MCHC: 33.3 g/dL (ref 31.0–37.0)
MCV: 99.5 FL (ref 78.0–100.0)
MONOCYTES: 12 % — ABNORMAL HIGH (ref 3–10)
MPV: 10.2 FL (ref 9.2–11.8)
NEUTROPHILS: 58 % (ref 40–73)
NRBC: 0 PER 100 WBC
PLATELET: 195 10*3/uL (ref 135–420)
RBC: 3.68 M/uL — ABNORMAL LOW (ref 4.35–5.65)
RDW: 13.3 % (ref 11.6–14.5)
WBC: 9 10*3/uL (ref 4.6–13.2)

## 2020-08-30 LAB — METABOLIC PANEL, COMPREHENSIVE
A-G Ratio: 1.4
ALT (SGPT): 18 U/L (ref 3–72)
AST (SGOT): 20 U/L (ref 17–74)
Albumin: 4.1 g/dL (ref 3.5–4.7)
Alk. phosphatase: 70 U/L (ref 38–126)
Anion gap: 10 mmol/L
BUN/Creatinine ratio: 16
BUN: 31 mg/dL — ABNORMAL HIGH (ref 9–21)
Bilirubin, total: 0.8 mg/dL (ref 0.2–1.0)
CO2: 28 mmol/L (ref 21–33)
Calcium: 8.8 mg/dL (ref 8.5–10.5)
Chloride: 99 mmol/L (ref 94–111)
Creatinine: 1.9 mg/dL — ABNORMAL HIGH (ref 0.8–1.50)
GFR est AA: 40 mL/min/{1.73_m2}
GFR est non-AA: 33 mL/min/{1.73_m2}
Globulin: 2.9 g/dL
Glucose: 88 mg/dL (ref 70–110)
Potassium: 4.4 mmol/L (ref 3.2–5.1)
Protein, total: 7 g/dL (ref 6.1–8.4)
Sodium: 137 mmol/L (ref 135–145)

## 2020-09-01 ENCOUNTER — Encounter: Payer: MEDICARE | Attending: Internal Medicine | Primary: Internal Medicine

## 2021-01-11 ENCOUNTER — Ambulatory Visit: Attending: Urology | Primary: Family Medicine

## 2021-01-11 ENCOUNTER — Ambulatory Visit: Admit: 2021-01-11 | Discharge: 2021-01-11 | Attending: Urology | Primary: Internal Medicine

## 2021-01-11 DIAGNOSIS — N401 Enlarged prostate with lower urinary tract symptoms: Secondary | ICD-10-CM

## 2021-01-11 LAB — AMB POC URINALYSIS DIP STICK AUTO W/O MICRO
Bilirubin (UA POC): NEGATIVE
Bilirubin, Urine, POC: NEGATIVE
Blood (UA POC): NEGATIVE
Blood (UA POC): NEGATIVE
Glucose (UA POC): NEGATIVE
Glucose, Urine, POC: NEGATIVE
Ketones (UA POC): NEGATIVE
Ketones, Urine, POC: NEGATIVE
Leukocyte Esterase, Urine, POC: NEGATIVE
Leukocyte esterase (UA POC): NEGATIVE
Nitrite, Urine, POC: NEGATIVE
Nitrites (UA POC): NEGATIVE
Specific Gravity, Urine, POC: 1.025 NA (ref 1.001–1.035)
Specific gravity (UA POC): 1.025 (ref 1.001–1.035)
Urobilinogen (UA POC): 0.2 (ref 0.2–1)
Urobilinogen, POC: 0.2 (ref 0.2–1)
pH (UA POC): 5.5 (ref 4.6–8.0)
pH, Urine, POC: 5.5 NA (ref 4.6–8.0)

## 2021-01-11 LAB — AMB POC PVR, MEAS,POST-VOID RES,US,NON-IMAGING
PVR POC: 0 cc
PVR: 0 cc

## 2021-01-11 MED ORDER — SILODOSIN 8 MG CAPSULE
8 mg | ORAL_CAPSULE | Freq: Every day | ORAL | 3 refills | Status: DC
Start: 2021-01-11 — End: 2021-11-02

## 2021-01-11 MED ORDER — FINASTERIDE 5 MG TAB
5 mg | ORAL_TABLET | Freq: Every day | ORAL | 3 refills | Status: AC
Start: 2021-01-11 — End: ?

## 2021-01-11 MED ORDER — MIRABEGRON ER 50 MG TABLET,EXTENDED RELEASE 24 HR
50 mg | ORAL_TABLET | Freq: Every day | ORAL | 3 refills | Status: AC
Start: 2021-01-11 — End: ?

## 2021-01-11 NOTE — Progress Notes (Addendum)
Progress  Notes by Lurlean NannyEure, Cande Mastropietro R, MD at 01/11/21 1400                Author: Lurlean NannyEure, Ailea Rhatigan R, MD  Service: --  Author Type: Physician       Filed: 01/23/21 2209  Encounter Date: 01/11/2021  Status: Signed          Editor: Lurlean NannyEure, Leyli Kevorkian R, MD (Physician)                       Follow-up Visit                IMP:              ICD-10-CM  ICD-9-CM             1.  Benign non-nodular prostatic hyperplasia with lower urinary tract symptoms   N40.1  600.91  AMB POC PVR, MEAS,POST-VOID RES,US,NON-IMAGING                AMB POC URINALYSIS DIP STICK AUTO W/O MICRO           2.  OAB (overactive bladder)   N32.81  596.51                            PLAN:    ??  PVR today is 0 cc   ??  UA today is negative   ??  Continue Myrbetriq 50 mg daily, discussed may decrease to 25 mg daily   ??  Continue Proscar 5 mg daily and Rapaflo 8 mg daily        Follow-up and Dispositions      ??  Return in about 1 year (around 01/11/2022) for PVR.                          Chief Complaint       Patient presents with        ?  Benign Prostatic Hypertrophy            HPI: Matthew Chandler  is a 87 y.o. WHITE/NON-HISPANIC  male  who presents today in follow up for an established diagnosis of BPH.       He is s/p UroLift 05/14/16. He had post op voiding trial on 06/03/16. He failed to voiding trial post op. Passed VT on 06/03/2016. Continues to have some urinary frequency and urgency. Nocturia 4-5x. Notes he is on Lasix and has some frequency after he takes  it.      Patient presents today doing well. He continues on Myrbetriq 50 mg daily and Proscar 5 mg daily and Rapaflo 8 mg daily (started in 12/2019). Patient reports he occasionally cuts Myrbetriq in half. Reports urinary urgency. Drinks iced tea/lemonade with  dinner.       Pre Op:   Work up included AUA score in the severe range with refractory retention. UDS confirmed obstruction with some bladder function TRUS Vol: ~80 gms.       AUA Assessment Score:   AUA Score: 27;     AUA Bother Rating: Mostly  dissatisfied           Current Outpatient Medications          Medication  Sig  Dispense  Refill           ?  finasteride (PROSCAR) 5 mg tablet  Take 1 Tablet by mouth daily.  90 Tablet  3     ?  mirabegron ER (Myrbetriq) 50 mg ER tablet  Take 1 Tablet by mouth daily.  90 Tablet  3     ?  silodosin (RAPAFLO) 8 mg capsule  Take 1 Capsule by mouth daily (with dinner).  90 Capsule  3     ?  dextromethorphan (DELSYM) 30 mg/5 mL liquid  Take 60 mg by mouth two (2) times a day.         ?  ibuprofen (MOTRIN) 200 mg tablet  Take 3 Tablets by mouth every six (6) hours as needed.         ?  guaiFENesin-dextromethorphan SR (HUMIBID DM) 600-30 mg per tablet  Take 1 Tablet by mouth two (2) times daily as needed.         ?  nitroglycerin (NITROSTAT) 0.4 mg SL tablet  0.4 mg by SubLINGual route every five (5) minutes as needed. Up to 3 doses.         ?  acetaminophen (TYLENOL) 500 mg tablet  Take  by mouth every eight (8) hours as needed.         ?  silodosin (RAPAFLO) 8 mg capsule  TAKE 1 CAPSULE BY MOUTH ONCE DAILY WITH  DINNER  90 Capsule  0     ?  multivit-min/FA/lycopen/lutein (CERTAVITE SENIOR PO)  Take  by mouth.         ?  cholecalciferol, vitamin D3, 50 mcg (2,000 unit) tab  Take 1 Tab by mouth daily.         ?  traMADoL (ULTRAM) 50 mg tablet  Take 50 mg by mouth every six (6) hours as needed for Pain.         ?  azelastine (ASTELIN) 137 mcg (0.1 %) nasal spray  1 Spray two (2) times a day. Use in each nostril as directed         ?  amoxicillin (AMOXIL) 500 mg capsule  Take 500 mg by mouth. FOR DENTAL PROCEDURE         ?  ammonium lactate (AMLACTIN) 12 % topical cream  Apply  to affected area two (2) times a day. rub in to affected area well         ?  apixaban (ELIQUIS) 2.5 mg tablet  2.5 mg.         ?  citalopram (CELEXA) 10 mg tablet  TK 1 T PO QD IN THE MORNING    11     ?  folic acid (FOLVITE) 1 mg tablet  TK 1 T PO QD    3           ?  furosemide (LASIX) 40 mg tablet  TAKE ONE-HALF TABLET PO PRF FLUID OR EDEMA    9            ?  omeprazole (PRILOSEC) 20 mg capsule  Take 20 mg by mouth daily.         ?  hydrocortisone (CORTIZONE) 0.5 % ointment  Apply  to affected area two (2) times a day. use thin layer         ?  fluticasone (FLONASE) 50 mcg/actuation nasal spray  2 Sprays by Both Nostrils route daily.         ?  loratadine (CLARITIN) 10 mg tablet  Take 10 mg by mouth.         ?  VIT A/VIT C/VIT E/ZINC/COPPER (PRESERVISION AREDS PO)  Take  by  mouth.               ?  magnesium oxide (MAG-OX) 400 mg tablet  Take 400 mg by mouth daily.               All:     Allergies        Allergen  Reactions         ?  Statins-Hmg-Coa Reductase Inhibitors  Unknown (comments)             Past Medical History:        Diagnosis  Date         ?  Anemia, unspecified       ?  Aortic valve replaced       ?  Bacteremia  12/2015          MSSA         ?  Benign non-nodular prostatic hyperplasia with lower urinary tract symptoms       ?  Benign prostatic hyperplasia with lower urinary tract symptoms       ?  Chronic diastolic heart failure (HCC)       ?  Chronic idiopathic constipation       ?  Generalized edema       ?  GERD without esophagitis       ?  Gross hematuria       ?  History of aortic valve replacement with bioprosthetic valve       ?  History of depression       ?  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  Hypertension, essential  11/12/2017     ?  Hypokalemia       ?  Hyponatremia with excess extracellular fluid volume       ?  Long-term use of high-risk medication       ?  Muscle cramps       ?  Muscular deconditioning       ?  Reactive depression       ?  Spinal stenosis of lumbar region       ?  Typical atrial flutter (HCC)       ?  Unspecified osteoarthritis, unspecified site  03/03/2019     ?  Urinary retention           ?  Urinary urgency               Past Surgical History:         Procedure  Laterality  Date          ?  HX APPENDECTOMY         ?  HX CATARACT REMOVAL         ?  HX CORONARY ARTERY BYPASS GRAFT     05/2013     ?  HX OTHER SURGICAL              NECK SURGERY          ?  HX TONSILLECTOMY         ?  HX UROLOGICAL    05/14/2016          [facility] - Cystoscopy and UroLift (transprostatic implants #10), Dr Asher Muir              Review of Systems   Constitutional: Fever: No   Skin: Rash: No   HEENT: Hearing difficulty: No   Eyes: Blurred vision:  No   Cardiovascular: Chest pain: No   Respiratory: Shortness of breath: No   Gastrointestinal: Nausea/vomiting: No   Musculoskeletal: Back pain: No   Neurological: Weakness: No   Psychological: Memory loss: No   Comments/additional findings:          PHYSICAL EXAM:        Visit Vitals      Resp  16     Ht  5\' 8"  (1.727 m)     Wt  145 lb (65.8 kg)        BMI  22.05 kg/m??        Constitutional: WDWN, Pleasant and appropriate affect, No acute distress.   Resp: No SOB, no breathing difficulty   Skin: Normal color and texture and No rashes or erythema noted   Neuro/Psych:  Alert and Oriented x3, affect appropriate.        Lab:     Results for orders placed or performed in visit on 01/11/21     AMB POC PVR, MEAS,POST-VOID RES,US,NON-IMAGING         Result  Value  Ref Range            PVR  0  cc       AMB POC URINALYSIS DIP STICK AUTO W/O MICRO         Result  Value  Ref Range            Color (UA POC)  Yellow         Clarity (UA POC)  Clear         Glucose (UA POC)  Negative  Negative       Bilirubin (UA POC)  Negative  Negative       Ketones (UA POC)  Negative  Negative       Specific gravity (UA POC)  1.025  1.001 - 1.035       Blood (UA POC)  Negative  Negative       pH (UA POC)  5.5  4.6 - 8.0       Protein (UA POC)  1+  Negative       Urobilinogen (UA POC)  0.2 mg/dL  0.2 - 1       Nitrites (UA POC)  Negative  Negative            Leukocyte esterase (UA POC)  Negative  Negative            01/13/21, MD         Documentation provided by Lurlean Nanny, medical scribe for Iona Coach, MD

## 2021-02-28 ENCOUNTER — Encounter: Payer: MEDICARE | Attending: Internal Medicine | Primary: Family

## 2021-03-02 ENCOUNTER — Encounter: Attending: Internal Medicine

## 2021-04-19 NOTE — Telephone Encounter (Signed)
Called Ms Barbera Setters at [phone] to follow up with her conversation with Carollee Herter. LM to call me back. She needs to set up a NP appt with Dr. Elvera Lennox for weakness and falls.

## 2021-04-20 ENCOUNTER — Ambulatory Visit: Attending: Family Medicine | Primary: Family Medicine

## 2021-04-20 ENCOUNTER — Encounter: Admit: 2021-04-20 | Discharge: 2021-04-20 | Payer: MEDICARE | Attending: Family Medicine | Primary: Internal Medicine

## 2021-04-20 DIAGNOSIS — E569 Vitamin deficiency, unspecified: Secondary | ICD-10-CM

## 2021-04-20 NOTE — Progress Notes (Signed)
 Progress  Notes by [place], Lavonda at 04/20/21 1430                Author: [place], Lavonda  Service: --  Author Type: --       Filed: 04/20/21 1518  Encounter Date: 04/20/2021  Status: Signed          Editor: [place], Lavonda                  Matthew Chandler presents today for      Chief Complaint       Patient presents with        ?  Fatigue             has been having some weakness and falls as well as over all decline            Is someone accompanying this pt? Yes daughter       Is the patient using any DME equipment during OV? Yes cane hearing aid glasses       Depression Screening:      3 most recent PHQ Screens  04/20/2021        PHQ Not Done  -     Little interest or pleasure in doing things  Several days     Feeling down, depressed, irritable, or hopeless  Several days        Total Score PHQ 2  2           Learning Assessment:      Learning Assessment  06/25/2019        PRIMARY LEARNER  Patient     HIGHEST LEVEL OF EDUCATION - PRIMARY LEARNER   4 YEARS OF COLLEGE     BARRIERS PRIMARY LEARNER  NONE     CO-LEARNER CAREGIVER  No     PRIMARY LANGUAGE  ENGLISH     LEARNER PREFERENCE PRIMARY  READING     ANSWERED BY  patient        RELATIONSHIP  SELF           Fall Risk      Fall Risk Assessment, last 12 mths  04/20/2021        Able to walk?  Yes     Fall in past 12 months?  1     Do you feel unsteady?  1     Are you worried about falling  1     Number of falls in past 12 months  2        Fall with injury?  1           ADL      ADL Assessment  08/29/2020        Feeding yourself  No Help Needed     Getting from bed to chair  Help Needed     Getting dressed  Help Needed     Bathing or showering  Help Needed     Walk across the room (includes cane/walker)  Help Needed     Using the telphone  Help Needed     Taking your medications  Help Needed     Preparing meals  Help Needed     Managing money (expenses/bills)  Help Needed     Moderately strenuous housework (laundry)  Help Needed     Shopping for personal items  (toiletries/medicines)  Help Needed     Shopping for groceries  Help Needed     Driving  Help Needed  Climbing a flight of stairs  Help Needed        Getting to places beyond walking distances  Help Needed           Travel Screening:         Travel Screening             Question     Response            In the last 10 days, have you been in contact with someone who was confirmed or suspected to have Coronavirus/COVID-19?    No / Unsure       Have you had a COVID-19 viral test in the last 10 days?           Do you have any of the following new or worsening symptoms?    None of these            Have you traveled internationally or domestically in the last month?    No             Travel History    Travel since 03/20/21      No documented travel since 03/20/21                Health Maintenance reviewed and discussed and ordered per Provider.        Health Maintenance Due        Topic  Date Due         ?  Pneumococcal 65+ years (1 - PCV)  Never done     ?  DTaP/Tdap/Td series (1 - Tdap)  Never done     ?  Shingrix Vaccine Age 21> (1 of 2)  Never done     ?  Medicare Yearly Exam   Never done         ?  Depression Screen   10/26/2020     .        Coordination of Care:   1. Have you been to the ER, urgent care clinic since your last visit?  Hospitalized since your last visit? No      2. Have you seen or consulted any other health care providers outside of the [facility] since your last visit? Yes When: 01/2021 Dr. Lunda, Dr. Jayne, Dr.Bucklry, Dr.Salib         3. For patients aged 62-75: Has the patient had a colonoscopy? NA - based on age        If the patient is male:      4. For patients aged 45-74: Has the patient had a mammogram within the past 2 years?  NA - based on age/sex      5. For patients aged 21-65: Has the patient had a pap smear? NA - based on age /sex

## 2021-04-20 NOTE — Progress Notes (Signed)
Matthew Chandler is a 87 y.o. male who presents to the office today for the following:  Chief Complaint   Patient presents with   ??? Fatigue     has been having some weakness and falls as well as over all decline        Allergies   Allergen Reactions   ??? Statins-Hmg-Coa Reductase Inhibitors Unknown (comments)       Current Outpatient Medications   Medication Sig   ??? finasteride (PROSCAR) 5 mg tablet Take 1 Tablet by mouth daily.   ??? mirabegron ER (Myrbetriq) 50 mg ER tablet Take 1 Tablet by mouth daily.   ??? silodosin (RAPAFLO) 8 mg capsule Take 1 Capsule by mouth daily (with dinner).   ??? ibuprofen (MOTRIN) 200 mg tablet Take 3 Tablets by mouth every six (6) hours as needed.   ??? nitroglycerin (NITROSTAT) 0.4 mg SL tablet 0.4 mg by SubLINGual route every five (5) minutes as needed. Up to 3 doses.   ??? acetaminophen (TYLENOL) 500 mg tablet Take  by mouth every eight (8) hours as needed.   ??? silodosin (RAPAFLO) 8 mg capsule TAKE 1 CAPSULE BY MOUTH ONCE DAILY WITH  DINNER   ??? multivit-min/FA/lycopen/lutein (CERTAVITE SENIOR PO) Take  by mouth.   ??? cholecalciferol, vitamin D3, 50 mcg (2,000 unit) tab Take 1 Tab by mouth daily.   ??? azelastine (ASTELIN) 137 mcg (0.1 %) nasal spray 1 Spray two (2) times a day. Use in each nostril as directed   ??? ammonium lactate (AMLACTIN) 12 % topical cream Apply  to affected area two (2) times a day. rub in to affected area well   ??? apixaban (ELIQUIS) 2.5 mg tablet 2.5 mg.   ??? citalopram (CELEXA) 10 mg tablet TK 1 T PO QD IN THE MORNING   ??? folic acid (FOLVITE) 1 mg tablet TK 1 T PO QD   ??? furosemide (LASIX) 40 mg tablet TAKE ONE-HALF TABLET PO PRF FLUID OR EDEMA   ??? omeprazole (PRILOSEC) 20 mg capsule Take 20 mg by mouth daily.   ??? hydrocortisone (CORTIZONE) 0.5 % ointment Apply  to affected area two (2) times a day. use thin layer   ??? fluticasone (FLONASE) 50 mcg/actuation nasal spray 2 Sprays by Both Nostrils route daily.   ??? VIT A/VIT C/VIT E/ZINC/COPPER (PRESERVISION AREDS PO) Take  by  mouth.   ??? magnesium oxide (MAG-OX) 400 mg tablet Take 400 mg by mouth daily.     No current facility-administered medications for this visit.       Past Medical History:   Diagnosis Date   ??? Anemia, unspecified    ??? Aortic valve replaced    ??? Bacteremia 12/2015    MSSA   ??? Benign non-nodular prostatic hyperplasia with lower urinary tract symptoms    ??? Benign prostatic hyperplasia with lower urinary tract symptoms    ??? Chronic diastolic heart failure (HCC)    ??? Chronic idiopathic constipation    ??? Generalized edema    ??? GERD without esophagitis    ??? Gross hematuria    ??? History of aortic valve replacement with bioprosthetic valve    ??? History of depression    ??? 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 Hypertension, essential 11/12/2017   ??? Hypokalemia    ??? Hyponatremia with excess extracellular fluid volume    ??? Long-term use of high-risk medication    ??? Muscle cramps    ??? Muscular deconditioning    ???  Reactive depression    ??? Spinal stenosis of lumbar region    ??? Typical atrial flutter (HCC)    ??? Unspecified osteoarthritis, unspecified site 03/03/2019   ??? Urinary retention    ??? Urinary urgency        Past Surgical History:   Procedure Laterality Date   ??? HX APPENDECTOMY     ??? HX CATARACT REMOVAL     ??? HX CORONARY ARTERY BYPASS GRAFT  05/2013   ??? HX OTHER SURGICAL      NECK SURGERY   ??? HX TONSILLECTOMY     ??? HX UROLOGICAL  05/14/2016    [facility] - Cystoscopy and UroLift (transprostatic implants #10), Dr Asher Muir       Social History     Socioeconomic History   ??? Marital status: WIDOWED     Spouse name: Not on file   ??? Number of children: Not on file   ??? Years of education: Not on file   ??? Highest education level: Not on file   Occupational History   ??? Not on file   Tobacco Use   ??? Smoking status: Never Smoker   ??? Smokeless tobacco: Never Used   Substance and Sexual Activity   ??? Alcohol use: Never   ??? Drug use: Not on file   ??? Sexual activity: Not on file   Other Topics Concern   ??? Not on file   Social  History Narrative   ??? Not on file     Social Determinants of Health     Financial Resource Strain:    ??? Difficulty of Paying Living Expenses: Not on file   Food Insecurity:    ??? Worried About Running Out of Food in the Last Year: Not on file   ??? Ran Out of Food in the Last Year: Not on file   Transportation Needs:    ??? Lack of Transportation (Medical): Not on file   ??? Lack of Transportation (Non-Medical): Not on file   Physical Activity:    ??? Days of Exercise per Week: Not on file   ??? Minutes of Exercise per Session: Not on file   Stress:    ??? Feeling of Stress : Not on file   Social Connections:    ??? Frequency of Communication with Friends and Family: Not on file   ??? Frequency of Social Gatherings with Friends and Family: Not on file   ??? Attends Religious Services: Not on file   ??? Active Member of Clubs or Organizations: Not on file   ??? Attends Banker Meetings: Not on file   ??? Marital Status: Not on file   Intimate Partner Violence:    ??? Fear of Current or Ex-Partner: Not on file   ??? Emotionally Abused: Not on file   ??? Physically Abused: Not on file   ??? Sexually Abused: Not on file   Housing Stability:    ??? Unable to Pay for Housing in the Last Year: Not on file   ??? Number of Places Lived in the Last Year: Not on file   ??? Unstable Housing in the Last Year: Not on file     or  Social History     Tobacco Use   Smoking Status Never Smoker   Smokeless Tobacco Never Used       History reviewed. No pertinent family history.      HPI:  Patient is brought in by daughter.  She notes that over the last  5 months he has had a gradual decline.  Where before he was able to walk on his own and eat on his own but now he has a stuttering walk and has lost a significant amount of weight in the last few months.  He has not been having much of an appetite and when asked specifically she said he does not like the food that he is eating at the [facility] at the home care place where he is at.  Has seen his urologist in March.   Denies any recent cough or dysuria or fevers or chills.        ROS:  Review of Systems   Constitutional: Negative.    Respiratory: Negative.    Cardiovascular: Negative.    All other systems reviewed and are negative.      Physical Exam:  Visit Vitals  BP (!) 163/83   Pulse (!) 58   Temp 97.5 ??F (36.4 ??C)   Resp 16   Wt 139 lb 3.2 oz (63.1 kg)   SpO2 97%   BMI 21.17 kg/m??     Physical Exam  Vitals reviewed.   Constitutional:       Appearance: Normal appearance.   Cardiovascular:      Rate and Rhythm: Normal rate and regular rhythm.   Pulmonary:      Effort: Pulmonary effort is normal.      Breath sounds: Normal breath sounds.   Neurological:      Mental Status: He is alert.         Assessment/Plan:    Orders Placed This Encounter   ??? CBC WITH AUTOMATED DIFF   ??? METABOLIC PANEL, COMPREHENSIVE   ??? VITAMIN B12   ??? FOLATE   ??? MAGNESIUM   ??? VITAMIN D, 25 HYDROXY     or  1.  Falls/ fatigue/weight loss  Recommended getting blood work to evaluate any nutritional deficiencies including CBC, CMP, vitamin B12, folate, magnesium and vitamin D.  There does not seem to be infectious cause.  But a gradual decline with weight loss over time could be related to his age.  Rule out medical causes.  Also thoroughly reviewed all medications to see if any could contribute to his weakness. Placed in referral to physical therapy as this was what daughter who requested for today.  NEL, COMPREHENSIVE  - MAGNESIUM        Follow-up and Dispositions    ?? Return if symptoms worsen or fail to improve.           Carolann Littler, MD

## 2021-05-02 ENCOUNTER — Inpatient Hospital Stay: Admit: 2021-05-02 | Payer: MEDICARE

## 2021-05-02 DIAGNOSIS — R350 Frequency of micturition: Secondary | ICD-10-CM

## 2021-05-02 LAB — URINALYSIS W/ RFLX MICROSCOPIC
Bilirubin, Urine: NEGATIVE
Bilirubin: NEGATIVE
Blood, Urine: NEGATIVE
Blood: NEGATIVE
Glucose, Ur: NEGATIVE mg/dL
Glucose: NEGATIVE mg/dL
Ketone: NEGATIVE mg/dL
Ketones, Urine: NEGATIVE mg/dL
Leukocyte Esterase, Urine: NEGATIVE
Leukocyte Esterase: NEGATIVE
Nitrite, Urine: NEGATIVE
Nitrites: NEGATIVE
Specific Gravity, UA: 1.011 (ref 1.005–1.030)
Specific gravity: 1.011 (ref 1.005–1.030)
Urobilinogen, UA, POCT: 0.2 EU/dL (ref 0.2–1.0)
Urobilinogen: 0.2 EU/dL (ref 0.2–1.0)
pH (UA): 7 (ref 5.0–8.0)
pH, UA: 7 (ref 5.0–8.0)

## 2021-05-02 LAB — URINE MICROSCOPIC

## 2021-05-02 NOTE — Telephone Encounter (Signed)
Lupita Leash from the [facility] called to let us know that pt had a fall on July 3rd. She said he is becoming more unstable. He has an appt with Dr. Artis Delay on 05/04/21 (daughter wanted to keep that appt) and has upcoming appt with Dr. Erma Heritage. (one scheduled 05/28/21 has to be rescheduled due to provider not in office).

## 2021-05-02 NOTE — Telephone Encounter (Signed)
Patient has appt with Dr. Artis Delay on July 8.  Sending to make him aware.

## 2021-05-04 ENCOUNTER — Encounter: Payer: MEDICARE | Attending: Student in an Organized Health Care Education/Training Program | Primary: Family

## 2021-05-04 LAB — CULTURE, URINE
Colonies Counted: >100000
Colony Count: >100000

## 2021-05-16 ENCOUNTER — Encounter

## 2021-05-23 ENCOUNTER — Inpatient Hospital Stay: Payer: MEDICARE

## 2021-05-23 NOTE — Telephone Encounter (Signed)
Pt daughter Nedra Hai called in staring that Mr. Staffieri health has been declining in the last couple of months but Mr. Lablanc is having a significantly increase in urgency, frequency and no control over his urine. She stated that it is hard to get her dad out but if pt needs to be seen then she would schedule an appt but first she would like to know what recommendations does Dr. Despina Hidden have. She can be reached at [phone].    Please Assist.Thank You.

## 2021-05-28 ENCOUNTER — Encounter: Attending: Family Medicine

## 2021-05-29 ENCOUNTER — Inpatient Hospital Stay: Admit: 2021-05-29 | Payer: MEDICARE

## 2021-05-29 DIAGNOSIS — I63512 Cerebral infarction due to unspecified occlusion or stenosis of left middle cerebral artery: Secondary | ICD-10-CM

## 2021-05-30 ENCOUNTER — Encounter

## 2021-05-30 MED ORDER — BENZONATATE 100 MG CAP
100 mg | ORAL_CAPSULE | Freq: Three times a day (TID) | ORAL | 0 refills | Status: DC | PRN
Start: 2021-05-30 — End: 2021-06-07

## 2021-05-30 MED ORDER — PAXLOVID 300 MG (150 MG X 2)-100 MG TABLETS IN A DOSE PACK (EUA)
300 mg (150 mg x 2)-100 mg | ORAL | 0 refills | Status: DC
Start: 2021-05-30 — End: 2021-07-10

## 2021-05-30 NOTE — Telephone Encounter (Signed)
-----   Message from Lurline Del sent at 05/30/2021 11:27 AM EDT -----  Subject: Appointment Request    Reason for Call: New Patient/New to Provider Appointment needed: New   Patient Request Appointment    QUESTIONS    Reason for appointment request? No appointments available during search     Additional Information for Provider? daughter calling to rs patients new   to provider apt. pt has covid. unable to rs - error stating [facility] unable to   schedule for provider. please advise and call Matthew Chandler, daughter.   ---------------------------------------------------------------------------  --------------  Matthew Chandler INFO  [phone]; OK to leave message on voicemail  ---------------------------------------------------------------------------  --------------  SCRIPT ANSWERS  COVID Screen: Red

## 2021-05-30 NOTE — Telephone Encounter (Signed)
Tried to return call to cancel appt and reschedule but no answer and voicemail box is full.

## 2021-05-31 NOTE — Telephone Encounter (Signed)
LMOM for return call to verify where pt would like urine culture performed.

## 2021-06-05 ENCOUNTER — Encounter: Payer: MEDICARE | Attending: Family Medicine | Primary: Family Medicine

## 2021-06-05 NOTE — Telephone Encounter (Signed)
Attempted to reach pt an additional time, no answer will try back

## 2021-06-07 ENCOUNTER — Encounter

## 2021-06-07 MED ORDER — BENZONATATE 100 MG CAP
100 mg | ORAL_CAPSULE | Freq: Three times a day (TID) | ORAL | 0 refills | Status: AC | PRN
Start: 2021-06-07 — End: 2021-06-14

## 2021-06-07 NOTE — Telephone Encounter (Signed)
Terri from the [facility] called says patient needs a refill on his Kimberlee Nearing says he still has the cough

## 2021-06-20 ENCOUNTER — Inpatient Hospital Stay
Admit: 2021-06-20 | Discharge: 2021-06-20 | Disposition: A | Discharge status: Home / Self Care | Payer: MEDICARE | Attending: Emergency Medicine

## 2021-06-20 ENCOUNTER — Emergency Department: Admit: 2021-06-20 | Payer: MEDICARE

## 2021-06-20 DIAGNOSIS — S50312A Abrasion of left elbow, initial encounter: Secondary | ICD-10-CM

## 2021-06-20 LAB — CBC WITH AUTO DIFFERENTIAL
Basophils %: 0 % (ref 0–2)
Basophils Absolute: 0 10*3/uL (ref 0.0–0.1)
Eosinophils %: 2 % (ref 0–5)
Eosinophils Absolute: 0.2 10*3/uL (ref 0.0–0.4)
Granulocyte Absolute Count: 0 10*3/uL (ref 0.00–0.04)
Hematocrit: 32.4 % — ABNORMAL LOW (ref 36.0–48.0)
Hemoglobin: 11 g/dL — ABNORMAL LOW (ref 13.0–16.0)
Immature Granulocytes: 0 % (ref 0–0.5)
Lymphocytes %: 13 % — ABNORMAL LOW (ref 21–52)
Lymphocytes Absolute: 1 10*3/uL (ref 0.9–3.6)
MCH: 32.4 PG (ref 24.0–34.0)
MCHC: 34 g/dL (ref 31.0–37.0)
MCV: 95.3 FL (ref 78.0–100.0)
MPV: 12.5 FL — ABNORMAL HIGH (ref 9.2–11.8)
Monocytes %: 12 % — ABNORMAL HIGH (ref 3–10)
Monocytes Absolute: 0.9 10*3/uL (ref 0.05–1.2)
NRBC Absolute: 0 10*3/uL (ref 0.00–0.01)
Neutrophils %: 73 % (ref 40–73)
Neutrophils Absolute: 5.7 10*3/uL (ref 1.8–8.0)
Nucleated RBCs: 0 PER 100 WBC
Platelets: 74 10*3/uL — ABNORMAL LOW (ref 135–420)
RBC: 3.4 M/uL — ABNORMAL LOW (ref 4.35–5.65)
RDW: 13.4 % (ref 11.6–14.5)
WBC: 7.8 10*3/uL (ref 4.6–13.2)

## 2021-06-20 LAB — COMPREHENSIVE METABOLIC PANEL
ALT: 17 U/L (ref 16–61)
AST: 16 U/L (ref 10–38)
Albumin/Globulin Ratio: 0.9 (ref 0.8–1.7)
Albumin: 3.2 g/dL — ABNORMAL LOW (ref 3.4–5.0)
Alkaline Phosphatase: 83 U/L (ref 45–117)
Anion Gap: 6 mmol/L (ref 3.0–18.0)
BUN: 34 mg/dL — ABNORMAL HIGH (ref 7–18)
Bun/Cre Ratio: 18 (ref 12–20)
CO2: 30 mmol/L (ref 21–32)
Calcium: 8.8 mg/dL (ref 8.5–10.1)
Chloride: 97 mmol/L — ABNORMAL LOW (ref 100–111)
Creatinine: 1.91 mg/dL — ABNORMAL HIGH (ref 0.60–1.30)
EGFR IF NonAfrican American: 33 mL/min/{1.73_m2} — ABNORMAL LOW (ref >60)
GFR African American: 40 mL/min/{1.73_m2} — ABNORMAL LOW (ref >60)
Globulin: 3.6 g/dL (ref 2.0–4.0)
Glucose: 93 mg/dL (ref 74–99)
Potassium: 4.2 mmol/L (ref 3.5–5.5)
Sodium: 133 mmol/L — ABNORMAL LOW (ref 136–145)
Total Bilirubin: 0.5 mg/dL (ref 0.2–1.0)
Total Protein: 6.8 g/dL (ref 6.4–8.2)

## 2021-06-20 LAB — TROPONIN, HIGH SENSITIVITY: Troponin, High Sensitivity: 29 ng/L (ref 0–78)

## 2021-06-20 LAB — METABOLIC PANEL, COMPREHENSIVE
A-G Ratio: 0.9 (ref 0.8–1.7)
ALT (SGPT): 17 U/L (ref 16–61)
AST (SGOT): 16 U/L (ref 10–38)
Albumin: 3.2 g/dL — ABNORMAL LOW (ref 3.4–5.0)
Alk. phosphatase: 83 U/L (ref 45–117)
Anion gap: 6 mmol/L (ref 3.0–18.0)
BUN/Creatinine ratio: 18 (ref 12–20)
BUN: 34 mg/dL — ABNORMAL HIGH (ref 7–18)
Bilirubin, total: 0.5 mg/dL (ref 0.2–1.0)
CO2: 30 mmol/L (ref 21–32)
Calcium: 8.8 mg/dL (ref 8.5–10.1)
Chloride: 97 mmol/L — ABNORMAL LOW (ref 100–111)
Creatinine: 1.91 mg/dL — ABNORMAL HIGH (ref 0.60–1.30)
GFR est AA: 40 mL/min/{1.73_m2} — ABNORMAL LOW (ref >60)
GFR est non-AA: 33 mL/min/{1.73_m2} — ABNORMAL LOW (ref >60)
Globulin: 3.6 g/dL (ref 2.0–4.0)
Glucose: 93 mg/dL (ref 74–99)
Potassium: 4.2 mmol/L (ref 3.5–5.5)
Protein, total: 6.8 g/dL (ref 6.4–8.2)
Sodium: 133 mmol/L — ABNORMAL LOW (ref 136–145)

## 2021-06-20 LAB — CBC WITH AUTOMATED DIFF
ABS. BASOPHILS: 0 10*3/uL (ref 0.0–0.1)
ABS. EOSINOPHILS: 0.2 10*3/uL (ref 0.0–0.4)
ABS. IMM. GRANS.: 0 10*3/uL (ref 0.00–0.04)
ABS. LYMPHOCYTES: 1 10*3/uL (ref 0.9–3.6)
ABS. MONOCYTES: 0.9 10*3/uL (ref 0.05–1.2)
ABS. NEUTROPHILS: 5.7 10*3/uL (ref 1.8–8.0)
ABSOLUTE NRBC: 0 10*3/uL (ref 0.00–0.01)
BASOPHILS: 0 % (ref 0–2)
EOSINOPHILS: 2 % (ref 0–5)
HCT: 32.4 % — ABNORMAL LOW (ref 36.0–48.0)
HGB: 11 g/dL — ABNORMAL LOW (ref 13.0–16.0)
IMMATURE GRANULOCYTES: 0 % (ref 0–0.5)
LYMPHOCYTES: 13 % — ABNORMAL LOW (ref 21–52)
MCH: 32.4 PG (ref 24.0–34.0)
MCHC: 34 g/dL (ref 31.0–37.0)
MCV: 95.3 FL (ref 78.0–100.0)
MONOCYTES: 12 % — ABNORMAL HIGH (ref 3–10)
MPV: 12.5 FL — ABNORMAL HIGH (ref 9.2–11.8)
NEUTROPHILS: 73 % (ref 40–73)
NRBC: 0 PER 100 WBC
PLATELET: 74 10*3/uL — ABNORMAL LOW (ref 135–420)
RBC: 3.4 M/uL — ABNORMAL LOW (ref 4.35–5.65)
RDW: 13.4 % (ref 11.6–14.5)
WBC: 7.8 10*3/uL (ref 4.6–13.2)

## 2021-06-20 LAB — TROPONIN-HIGH SENSITIVITY: Troponin-High Sensitivity: 29 ng/L (ref 0–78)

## 2021-06-20 MED ORDER — BACITRACIN 500 UNIT/G TOPICAL PACKET
500 unit/gram | CUTANEOUS | Status: AC
Start: 2021-06-20 — End: 2021-06-20
  Administered 2021-06-20: 13:00:00 via TOPICAL

## 2021-06-20 MED FILL — BACITRACIN 500 UNIT/G TOPICAL PACKET: 500 unit/gram | CUTANEOUS | Qty: 1

## 2021-06-20 NOTE — ED Provider Notes (Signed)
ED Provider Notes by Luanna Salk, MD at 06/20/21 [phone]                Author: Luanna Salk, MD  Service: EMERGENCY  Author Type: Physician       Filed: 06/20/21 1143  Date of Service: 06/20/21 0913  Status: Signed          Editor: Luanna Salk, MD (Physician)               EMERGENCY DEPARTMENT HISTORY AND PHYSICAL EXAM           Date: 06/20/2021   Patient Name: Matthew Chandler        History of Presenting Illness          Chief Complaint       Patient presents with        ?  Fall           History Provided By: Nursing home.  [facility]      HPI: Matthew Chandler, 87 y.o. male chronic diastolic heart failure, hyponatremia, aortic valve replacement bioprosthetic, atrial flutter ON ELIQUIS,  Covid on Paxlovid; spinal stenosis, coronary artery bypass 8/14, appendectomy, nerve neck surgery, hypertension, urinary retention, depression that is sent from the [facility] because during rounds this morning they found him on the floor.  Unknown how long  he has been on the floor.  They found that he had abrasions and skin tears of his left knee and to a lesser extent the left elbow.  They are not sure if he hit his head.  The patient denies any pain at this time.  He denies any chest pain, shortness of  breath, abdominal pain, headache.   He is hard of hearing.   There are no other complaints, changes, or physical findings at this time.      PCP: None        Current Outpatient Medications          Medication  Sig  Dispense  Refill           ?  nirmatrelvir-ritonavir (Paxlovid, EUA,) 300 mg (150 mg x 2)-100 mg tablet  Nirmatrelvir 300 mg with ritonavir 100 mg, administered together, twice daily for 5 days;  1 Box  0     ?  finasteride (PROSCAR) 5 mg tablet  Take 1 Tablet by mouth daily.  90 Tablet  3     ?  mirabegron ER (Myrbetriq) 50 mg ER tablet  Take 1 Tablet by mouth daily.  90 Tablet  3     ?  silodosin (RAPAFLO) 8 mg capsule  Take 1 Capsule by mouth daily (with dinner).  90 Capsule  3      ?  ibuprofen (MOTRIN) 200 mg tablet  Take 3 Tablets by mouth every six (6) hours as needed.               ?  nitroglycerin (NITROSTAT) 0.4 mg SL tablet  0.4 mg by SubLINGual route every five (5) minutes as needed. Up to 3 doses.               ?  acetaminophen (TYLENOL) 500 mg tablet  Take  by mouth every eight (8) hours as needed.         ?  silodosin (RAPAFLO) 8 mg capsule  TAKE 1 CAPSULE BY MOUTH ONCE DAILY WITH  DINNER  90 Capsule  0     ?  multivit-min/FA/lycopen/lutein (CERTAVITE SENIOR PO)  Take  by mouth.         ?  cholecalciferol, vitamin D3, 50 mcg (2,000 unit) tab  Take 1 Tab by mouth daily.         ?  azelastine (ASTELIN) 137 mcg (0.1 %) nasal spray  1 Spray two (2) times a day. Use in each nostril as directed         ?  ammonium lactate (AMLACTIN) 12 % topical cream  Apply  to affected area two (2) times a day. rub in to affected area well         ?  apixaban (ELIQUIS) 2.5 mg tablet  2.5 mg.         ?  citalopram (CELEXA) 10 mg tablet  TK 1 T PO QD IN THE MORNING    11     ?  folic acid (FOLVITE) 1 mg tablet  TK 1 T PO QD    3     ?  furosemide (LASIX) 40 mg tablet  TAKE ONE-HALF TABLET PO PRF FLUID OR EDEMA    9     ?  omeprazole (PRILOSEC) 20 mg capsule  Take 20 mg by mouth daily.         ?  hydrocortisone (CORTIZONE) 0.5 % ointment  Apply  to affected area two (2) times a day. use thin layer         ?  fluticasone (FLONASE) 50 mcg/actuation nasal spray  2 Sprays by Both Nostrils route daily.         ?  VIT A/VIT C/VIT E/ZINC/COPPER (PRESERVISION AREDS PO)  Take  by mouth.               ?  magnesium oxide (MAG-OX) 400 mg tablet  Take 400 mg by mouth daily.                 Past History     Past Medical History:     Past Medical History:        Diagnosis  Date         ?  Anemia, unspecified       ?  Aortic valve replaced       ?  Bacteremia  12/2015          MSSA         ?  Benign non-nodular prostatic hyperplasia with lower urinary tract symptoms       ?  Benign prostatic hyperplasia with lower  urinary tract symptoms       ?  Chronic diastolic heart failure ([place])       ?  Chronic idiopathic constipation       ?  Generalized edema       ?  GERD without esophagitis       ?  Gross hematuria       ?  History of aortic valve replacement with bioprosthetic valve       ?  History of depression       ?  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  Hypertension, essential  11/12/2017     ?  Hypokalemia       ?  Hyponatremia with excess extracellular fluid volume       ?  Long-term use of high-risk medication       ?  Muscle cramps       ?  Muscular deconditioning       ?  Reactive depression       ?  Spinal stenosis of lumbar region       ?  Typical atrial flutter ([place])       ?  Unspecified osteoarthritis, unspecified site  03/03/2019     ?  Urinary retention           ?  Urinary urgency             Past Surgical History:     Past Surgical History:         Procedure  Laterality  Date          ?  HX APPENDECTOMY         ?  HX CATARACT REMOVAL         ?  HX CORONARY ARTERY BYPASS GRAFT    05/2013     ?  HX OTHER SURGICAL              NECK SURGERY          ?  HX TONSILLECTOMY         ?  HX UROLOGICAL    05/14/2016          [place] - Cystoscopy and UroLift (transprostatic implants #10), Dr Sampson Si           Family History:   History reviewed. No pertinent family history.      Social History:     Social History          Tobacco Use         ?  Smoking status:  Never     ?  Smokeless tobacco:  Never       Substance Use Topics         ?  Alcohol use:  Never         ?  Drug use:  Never           Allergies:     Allergies        Allergen  Reactions         ?  Statins-Hmg-Coa Reductase Inhibitors  Unknown (comments)          Review of Systems     Review of Systems    Unable to perform ROS: Dementia          Physical Exam     Physical Exam   Vitals and nursing note reviewed.    Constitutional:        General: He is not in acute distress.      Appearance: He is not ill-appearing.    HENT:       Head: Atraumatic.        Comments: Negative battle sign, raccoon eyes, CSF rhinorrhea.   Eyes:       Extraocular Movements: Extraocular movements intact.       Conjunctiva/sclera: Conjunctivae normal.     Cardiovascular:       Rate and Rhythm: Regular rhythm.       Heart sounds: Normal heart sounds. No murmur heard.   Pulmonary:       Effort: No respiratory distress.       Breath sounds: Normal breath sounds. No wheezing or rales.     Abdominal:       General: Bowel sounds are normal. There is no distension.       Palpations: Abdomen is soft.       Tenderness: There is no abdominal tenderness. There is no guarding.  Musculoskeletal:          General: Normal range of motion.       Cervical back: Neck supple. No tenderness.    Skin:      General: Skin is warm and dry.    Neurological:       Mental Status: He is alert. Mental status is at baseline.            Lab and Diagnostic Study Results     Labs -         Recent Results (from the past 12 hour(s))     EKG, 12 LEAD, INITIAL          Collection Time: 06/20/21  9:16 AM         Result  Value  Ref Range            Ventricular Rate  53  BPM       Atrial Rate  190  BPM       P-R Interval  68  ms       QRS Duration  161  ms       Q-T Interval  493  ms       QTC Calculation (Bezet)  463  ms       Calculated P Axis  -93  degrees       Calculated R Axis  41  degrees       Calculated T Axis  54  degrees       Diagnosis                 Ectopic atrial rhythm   Short PR interval   Right bundle branch block          EKG, 12 LEAD, SUBSEQUENT          Collection Time: 06/20/21  9:55 AM         Result  Value  Ref Range            Ventricular Rate  44  BPM       Atrial Rate  188  BPM       P-R Interval  204  ms       QRS Duration  164  ms       Q-T Interval  545  ms       QTC Calculation (Bezet)  467  ms       Calculated P Axis  -102  degrees       Calculated R Axis  10  degrees       Calculated T Axis  35  degrees       Diagnosis                 Sinus or ectopic atrial bradycardia   Right bundle  branch block          CBC WITH AUTOMATED DIFF          Collection Time: 06/20/21 10:05 AM         Result  Value  Ref Range            WBC  7.8  4.6 - 13.2 K/uL       RBC  3.40 (L)  4.35 - 5.65 M/uL       HGB  11.0 (L)  13.0 - 16.0 g/dL       HCT  32.4 (L)  36.0 - 48.0 %       MCV  95.3  78.0 -  100.0 FL       MCH  32.4  24.0 - 34.0 PG       MCHC  34.0  31.0 - 37.0 g/dL       RDW  13.4  11.6 - 14.5 %       PLATELET  74 (L)  135 - 420 K/uL       MPV  12.5 (H)  9.2 - 11.8 FL       NRBC  0.0  0.0 PER 100 WBC       ABSOLUTE NRBC  0.00  0.00 - 0.01 K/uL       NEUTROPHILS  73  40 - 73 %       LYMPHOCYTES  13 (L)  21 - 52 %       MONOCYTES  12 (H)  3 - 10 %       EOSINOPHILS  2  0 - 5 %       BASOPHILS  0  0 - 2 %       IMMATURE GRANULOCYTES  0  0 - 0.5 %       ABS. NEUTROPHILS  5.7  1.8 - 8.0 K/UL       ABS. LYMPHOCYTES  1.0  0.9 - 3.6 K/UL       ABS. MONOCYTES  0.9  0.05 - 1.2 K/UL       ABS. EOSINOPHILS  0.2  0.0 - 0.4 K/UL       ABS. BASOPHILS  0.0  0.0 - 0.1 K/UL       ABS. IMM. GRANS.  0.0  0.00 - 0.04 K/UL       DF  AUTOMATED          METABOLIC PANEL, COMPREHENSIVE          Collection Time: 06/20/21 10:05 AM         Result  Value  Ref Range            Sodium  133 (L)  136 - 145 mmol/L       Potassium  4.2  3.5 - 5.5 mmol/L       Chloride  97 (L)  100 - 111 mmol/L       CO2  30  21 - 32 mmol/L       Anion gap  6  3.0 - 18.0 mmol/L       Glucose  93  74 - 99 mg/dL       BUN  34 (H)  7 - 18 mg/dL       Creatinine  1.91 (H)  0.60 - 1.30 mg/dL       BUN/Creatinine ratio  18  12 - 20         GFR est AA  40 (L)  >60 ml/min/1.17m       GFR est non-AA  33 (L)  >60 ml/min/1.752m      Calcium  8.8  8.5 - 10.1 mg/dL       Bilirubin, total  0.5  0.2 - 1.0 mg/dL       AST (SGOT)  16  10 - 38 U/L       ALT (SGPT)  17  16 - 61 U/L       Alk. phosphatase  83  45 - 117 U/L       Protein, total  6.8  6.4 - 8.2 g/dL       Albumin  3.2 (L)  3.4 -  5.0 g/dL       Globulin  3.6  2.0 - 4.0 g/dL       A-G Ratio  0.9  0.8 - 1.7          TROPONIN-HIGH SENSITIVITY          Collection Time: 06/20/21 10:05 AM         Result  Value  Ref Range            Troponin-High Sensitivity  29  0 - 78 ng/L           Radiologic Studies -    '@LASTXRRESULT'$ @     CT Results  (Last 48 hours)                                       06/20/21 0930    CT HEAD WO CONT  Final result            Impression:           No acute intracranial abnormality.                              Narrative:    EXAM: CT head             INDICATION: Headache.             COMPARISON: None.             TECHNIQUE: Axial CT imaging of the head was performed without intravenous      contrast. Standard multiplanar coronal and sagittal reformatted images were      obtained and are included in interpretation.             One or more dose reduction techniques were used on this CT: automated exposure      control, adjustment of the mAs and/or kVp according to patient size, and      iterative reconstruction techniques.  The specific techniques used on this CT      exam have been documented in the patient's electronic medical record.  Digital      Imaging and Communications in Medicine (DICOM) format image data are available      to nonaffiliated external healthcare facilities or entities on a secure, media      free, reciprocally searchable basis with patient authorization for at least a      42-monthperiod after this study.       _______________             FINDINGS:             BRAIN AND POSTERIOR FOSSA: Atrophy. Patchy periventricular, deep and subcortical      white matter hypoattenuation which is nonspecific but likely represents chronic      ischemic changes.  No evidence of acute large vessel transcortical infarct or      acute parenchymal hemorrhage. No midline shift or hydrocephalus.             EXTRA-AXIAL SPACES AND MENINGES: There are no abnormal extra-axial fluid      collections.             CALVARIUM: Intact.             SINUSES: Clear.             OTHER: None.  _______________                                       CXR Results  (Last 48 hours)                                       06/20/21 1041    XR CHEST PORT  Final result            Impression:           Bilateral lower lobe parenchymal opacities, may reflect aspiration or pneumonia.                       Narrative:    EXAM: XR CHEST PORT             CLINICAL INDICATION/HISTORY: BRADYCARDIA      -Additional: None             COMPARISON: None             TECHNIQUE: Frontal view of the chest             _______________             FINDINGS:             HEART AND MEDIASTINUM: Prior median sternotomy and valve repair. Normal cardiac      size and mediastinal contours.             LUNGS AND PLEURAL SPACES: Bilateral lower lobe parenchymal opacities. No      pneumothorax or pleural effusion.             BONY THORAX AND SOFT TISSUES: No acute osseous abnormality             _______________                                         Medical Decision Making and ED Course     Differential Diagnosis & Medical Decision Making Provider Note:    Level fall with skin tears on the left knee and to a lesser extent the left elbow.  There is no evidence of head or neck injury but will get CT of the head anyway.      - I am the first and primary provider for this patient. I reviewed the vital signs, available nursing notes, past medical history, past surgical history, family history and social history. The patient's  presenting problems have been discussed, and the staff are in agreement with the care plan formulated and outlined with them.  I have encouraged them to ask questions as they arise throughout their visit.      Vital Signs-Reviewed the patient's vital signs.   Patient Vitals for the past 12 hrs:            Temp  Pulse  Resp  BP  SpO2            06/20/21 1107  --  (!) 48  17  (!) 177/91  --            06/20/21 1035  --  (!) 47  --  (!) 157/78  --     06/20/21 1007  --  Marland Kitchen  44  16  (!) 146/67  99 %            06/20/21 0903  97.5 ??F (36.4 ??C)  96  18   135/71  97 %           EKG interpretation: (Preliminary): EKG Interpreted by me.  1383 Shows AVB or slow atrial flutter 53/min.  Prolonged PR interval.  Prolonged QRS with right bundle branch block.  Normal axis, nonspecific  ST changes.      ED Course:     ?Complete AV Block? Will consult NP Brinkley      11:39 AM   Seen in ER by NP Rollen Sox; discussed case with her Cardiologist; EKG shows 2nd d AVB. Pt has cardiology at [facility]. Daughter wants no interventions. States that pt can be sent back to [facility] and keep his appt with [facility] next week.            Procedures and Critical Care             Disposition     Disposition: DISCHARGE      DISCHARGE PLAN:   1.      Current Discharge Medication List                 CONTINUE these medications which have NOT CHANGED          Details        nirmatrelvir-ritonavir (Paxlovid, EUA,) 300 mg (150 mg x 2)-100 mg tablet  Nirmatrelvir 300 mg with ritonavir 100 mg, administered together, twice daily for 5 days;   Qty: 1 Box, Refills: 0          Comments: Diagnosis: covid-19 ICD-10 Z28. 310, Z28. 311 and Z28. STAT NEED ASAP.   Associated Diagnoses: COVID-19               finasteride (PROSCAR) 5 mg tablet  Take 1 Tablet by mouth daily.   Qty: 90 Tablet, Refills: 3               mirabegron ER (Myrbetriq) 50 mg ER tablet  Take 1 Tablet by mouth daily.   Qty: 90 Tablet, Refills: 3               !! silodosin (RAPAFLO) 8 mg capsule  Take 1 Capsule by mouth daily (with dinner).   Qty: 90 Capsule, Refills: 3               ibuprofen (MOTRIN) 200 mg tablet  Take 3 Tablets by mouth every six (6) hours as needed.               nitroglycerin (NITROSTAT) 0.4 mg SL tablet  0.4 mg by SubLINGual route every five (5) minutes as needed. Up to 3 doses.               acetaminophen (TYLENOL) 500 mg tablet  Take  by mouth every eight (8) hours as needed.               !! silodosin (RAPAFLO) 8 mg capsule  TAKE 1 CAPSULE BY MOUTH ONCE DAILY WITH  DINNER   Qty: 90 Capsule, Refills: 0                multivit-min/FA/lycopen/lutein (CERTAVITE SENIOR PO)  Take  by mouth.               cholecalciferol, vitamin D3, 50 mcg (2,000 unit) tab  Take 1 Tab by mouth daily.  azelastine (ASTELIN) 137 mcg (0.1 %) nasal spray  1 Spray two (2) times a day. Use in each nostril as directed               ammonium lactate (AMLACTIN) 12 % topical cream  Apply  to affected area two (2) times a day. rub in to affected area well               apixaban (ELIQUIS) 2.5 mg tablet  2.5 mg.               citalopram (CELEXA) 10 mg tablet  TK 1 T PO QD IN THE MORNING   Refills: 11               folic acid (FOLVITE) 1 mg tablet  TK 1 T PO QD   Refills: 3               furosemide (LASIX) 40 mg tablet  TAKE ONE-HALF TABLET PO PRF FLUID OR EDEMA   Refills: 9               omeprazole (PRILOSEC) 20 mg capsule  Take 20 mg by mouth daily.               hydrocortisone (CORTIZONE) 0.5 % ointment  Apply  to affected area two (2) times a day. use thin layer               fluticasone (FLONASE) 50 mcg/actuation nasal spray  2 Sprays by Both Nostrils route daily.               VIT A/VIT C/VIT E/ZINC/COPPER (PRESERVISION AREDS PO)  Take  by mouth.               magnesium oxide (MAG-OX) 400 mg tablet  Take 400 mg by mouth daily.               !! - Potential duplicate medications found. Please discuss with provider.               2.      Follow-up Information                  Follow up With  Specialties  Details  Why  Contact Info                      FOLLOW UP WITH CARDIOLOGY [facility] NEXT WEEK                  3.  Return to ED if worse    4.      Current Discharge Medication List                       Diagnosis/Clinical Impression        Clinical Impression:       1.  Fall from ground level      2.  Abrasions of multiple sites         3.  Bradycardia with 41-50 beats per minute            Attestations: I,  Luanna Salk, MD, am the primary clinician of record.        Please note that this dictation was completed with Dragon, the computer voice  recognition software.  Quite often unanticipated grammatical, syntax, homophones, and other interpretive errors are inadvertently  transcribed by the computer software.  Please disregard these errors.  Please  excuse any errors that have escaped final proofreading.  Thank you.

## 2021-06-20 NOTE — ED Notes (Signed)
Pt bradycardic on arrival back from CT. Pulse irregular, pt. Placed on monitor, Dr. Denny Levy notified, EKG performed and labs obtained.

## 2021-06-20 NOTE — Consults (Signed)
Consults  by Cyril Mourning, NP at 06/20/21 1110                Author: Cyril Mourning, NP  Service: Nurse Practitioner  Author Type: Nurse Practitioner       Filed: 06/20/21 1151  Date of Service: 06/20/21 1110  Status: Signed           Editor: Cyril Mourning, NP (Nurse Practitioner)  Cosigner: Floreen Comber, MD at 06/22/21 1418            Consult Orders        1. IP CONSULT TO CARDIOLOGY [[id number]] ordered by Cyril Mourning, NP at 06/20/21 [place] CONSULT: Atrial flutter with slow ventricular rate      REQUESTING PROVIDER: H. Henriquez, MD      CHIEF COMPLAINT: Fall      HISTORY OF PRESENT ILLNESS:  Matthew Chandler is a 87 y.o. year-old male with past medical history significant for chronic HFpEF, chronic hyponatremia, aortic valve replacement, atrial flutter, CAD with CABG in 2014, dementia, and HTN who was seen today for  evaluation of atrial flutter with slow ventricular rate.  Unable to obtain reliable history from the patient due to memory impairment; information was supplemented by the patient's daughter at the bedside and the medical record.  The patient presented  to the [facility] ER this morning for evaluation after sustaining a fall.  The patient states he was coming out of the shower and fell.  The patient's daughter states that he was not using his walker and did not have his call alert button around his neck.  The  fall was unwitnessed.  The patient's daughter states that he has had several falls in which he becomes weak and falls but does not have the walker for assistance.  The patient denies any lightheadedness or palpitations prior to the fall.  He denies any  symptoms at this time, including chest pain, shortness of breath, lightheadedness, or palpitations.  He is a patient of [facility] in [place] and had been seeing Criss Alvine, MD for cardiology care; he recently left the practice  and has an upcoming appointment  next week with Dr. Carin Hock for continuation of cardiology care.  His daughter states that pacemaker implantation has been discussed in the past, but has been deferred as they do not wish to proceed with any invasive or distressing procedures.      Records from emergency department course thus far reviewed.       Telemetry reviewed.  No events overnight. The patient is in atrial flutter with slow ventricular rate.      INPATIENT MEDICATIONS:  Home medications reviewed.  No current facility-administered medications for this encounter.      Current Outpatient Medications:    ?  nirmatrelvir-ritonavir (Paxlovid, EUA,) 300 mg (150 mg x 2)-100 mg tablet, Nirmatrelvir 300 mg with ritonavir 100 mg, administered together,  twice daily for 5 days;, Disp: 1 Box, Rfl: 0   ?  finasteride (PROSCAR) 5 mg tablet, Take 1 Tablet by mouth daily., Disp: 90 Tablet, Rfl: 3   ?  mirabegron ER (Myrbetriq) 50 mg ER tablet, Take 1 Tablet by mouth daily.,  Disp: 90 Tablet, Rfl: 3   ?  silodosin (RAPAFLO) 8 mg capsule, Take 1 Capsule by mouth daily (with dinner)., Disp: 90 Capsule, Rfl: 3   ?  ibuprofen (MOTRIN) 200 mg tablet, Take 3 Tablets by mouth every six (6) hours as needed., Disp: , Rfl:    ?  nitroglycerin (NITROSTAT) 0.4 mg SL tablet, 0.4 mg by SubLINGual route every five (5) minutes as needed. Up to 3 doses., Disp: , Rfl:    ?  acetaminophen (TYLENOL) 500 mg tablet, Take  by mouth every eight (8) hours as needed., Disp: , Rfl:    ?  silodosin (RAPAFLO) 8 mg capsule, TAKE 1 CAPSULE BY MOUTH ONCE DAILY WITH  DINNER, Disp: 90 Capsule, Rfl: 0   ?  multivit-min/FA/lycopen/lutein (CERTAVITE SENIOR PO), Take  by mouth., Disp: , Rfl:    ?  cholecalciferol, vitamin D3, 50 mcg (2,000 unit) tab, Take 1 Tab by mouth daily., Disp: , Rfl:    ?  azelastine (ASTELIN) 137 mcg (0.1 %) nasal spray, 1 Spray two (2) times a day. Use in each nostril as directed, Disp: , Rfl:    ?  ammonium lactate (AMLACTIN) 12 % topical cream,  Apply  to affected area two (2) times a day. rub in to affected area well, Disp: , Rfl:    ?  apixaban (ELIQUIS) 2.5 mg tablet, 2.5 mg., Disp: , Rfl:    ?  citalopram (CELEXA) 10 mg tablet, TK 1 T PO QD IN THE MORNING, Disp: , Rfl: 11   ?  folic acid (FOLVITE) 1 mg tablet, TK 1 T PO QD, Disp: , Rfl: 3   ?  furosemide (LASIX) 40 mg tablet, TAKE ONE-HALF TABLET PO PRF FLUID OR EDEMA, Disp: , Rfl: 9   ?  omeprazole (PRILOSEC) 20 mg capsule, Take 20 mg by mouth daily., Disp: , Rfl:    ?  hydrocortisone (CORTIZONE) 0.5 % ointment, Apply  to affected area two (2) times a day. use thin layer, Disp: , Rfl:    ?  fluticasone (FLONASE) 50 mcg/actuation nasal spray, 2 Sprays by Both Nostrils route daily., Disp: , Rfl:    ?  VIT A/VIT C/VIT E/ZINC/COPPER (PRESERVISION AREDS PO), Take  by mouth., Disp: , Rfl:    ?  magnesium oxide (MAG-OX) 400 mg tablet, Take 400 mg by mouth daily., Disp: , Rfl:        ALLERGIES:  Allergies reviewed with the patient,     Allergies        Allergen  Reactions         ?  Statins-Hmg-Coa Reductase Inhibitors  Unknown (comments)           FAMILY HISTORY:  Family history reviewed.         SOCIAL HISTORY:  Notable for former tobacco use, no alcohol use, and no illicit drug use.      REVIEW OF SYSTEMS:  Complete review of systems performed, pertinents noted above, all other systems are negative.      PHYSICAL EXAMINATION:  Vital sign assessment reveal a blood pressure of  146/67  and pulse rate of 44.   Cardiovascular exam has a heart with an irregularly irregular rhythm and slow rate, normal S1 and S2.  No murmur present.  There are no rubs or gallops.   Good peripheral pulses.  No jugular venous distension.  Respiratory exam reveals clear lung fields, no rales or rhonchi.   Lymphatic exam reveals no edema.  Neurologic exam is nonfocal.  Recent labs results and imaging reviewed.  Notable findings include:      Recent Results (from the past 24 hour(s))     EKG, 12 LEAD, INITIAL          Collection  Time: 06/20/21  9:16 AM         Result  Value  Ref Range            Ventricular Rate  53  BPM       Atrial Rate  190  BPM       P-R Interval  68  ms       QRS Duration  161  ms       Q-T Interval  493  ms       QTC Calculation (Bezet)  463  ms       Calculated P Axis  -93  degrees       Calculated R Axis  41  degrees       Calculated T Axis  54  degrees       Diagnosis                 Ectopic atrial rhythm   Short PR interval   Right bundle branch block          EKG, 12 LEAD, SUBSEQUENT          Collection Time: 06/20/21  9:55 AM         Result  Value  Ref Range            Ventricular Rate  44  BPM       Atrial Rate  188  BPM       P-R Interval  204  ms       QRS Duration  164  ms       Q-T Interval  545  ms       QTC Calculation (Bezet)  467  ms       Calculated P Axis  -102  degrees       Calculated R Axis  10  degrees       Calculated T Axis  35  degrees       Diagnosis                 Sinus or ectopic atrial bradycardia   Right bundle branch block          CBC WITH AUTOMATED DIFF          Collection Time: 06/20/21 10:05 AM         Result  Value  Ref Range            WBC  7.8  4.6 - 13.2 K/uL       RBC  3.40 (L)  4.35 - 5.65 M/uL       HGB  11.0 (L)  13.0 - 16.0 g/dL       HCT  32.4 (L)  36.0 - 48.0 %       MCV  95.3  78.0 - 100.0 FL       MCH  32.4  24.0 - 34.0 PG       MCHC  34.0  31.0 - 37.0 g/dL       RDW  13.4  11.6 - 14.5 %       PLATELET  74 (L)  135 - 420 K/uL       MPV  12.5 (H)  9.2 - 11.8 FL  NRBC  0.0  0.0 PER 100 WBC       ABSOLUTE NRBC  0.00  0.00 - 0.01 K/uL       NEUTROPHILS  73  40 - 73 %       LYMPHOCYTES  13 (L)  21 - 52 %       MONOCYTES  12 (H)  3 - 10 %       EOSINOPHILS  2  0 - 5 %       BASOPHILS  0  0 - 2 %       IMMATURE GRANULOCYTES  0  0 - 0.5 %       ABS. NEUTROPHILS  5.7  1.8 - 8.0 K/UL       ABS. LYMPHOCYTES  1.0  0.9 - 3.6 K/UL       ABS. MONOCYTES  0.9  0.05 - 1.2 K/UL       ABS. EOSINOPHILS  0.2  0.0 - 0.4 K/UL       ABS. BASOPHILS  0.0  0.0 - 0.1 K/UL       ABS. IMM. GRANS.   0.0  0.00 - 0.04 K/UL       DF  AUTOMATED          METABOLIC PANEL, COMPREHENSIVE          Collection Time: 06/20/21 10:05 AM         Result  Value  Ref Range            Sodium  133 (L)  136 - 145 mmol/L       Potassium  4.2  3.5 - 5.5 mmol/L       Chloride  97 (L)  100 - 111 mmol/L       CO2  30  21 - 32 mmol/L       Anion gap  6  3.0 - 18.0 mmol/L       Glucose  93  74 - 99 mg/dL       BUN  34 (H)  7 - 18 mg/dL       Creatinine  1.91 (H)  0.60 - 1.30 mg/dL       BUN/Creatinine ratio  18  12 - 20         GFR est AA  40 (L)  >60 ml/min/1.71m       GFR est non-AA  33 (L)  >60 ml/min/1.720m      Calcium  8.8  8.5 - 10.1 mg/dL       Bilirubin, total  0.5  0.2 - 1.0 mg/dL       AST (SGOT)  16  10 - 38 U/L       ALT (SGPT)  17  16 - 61 U/L       Alk. phosphatase  83  45 - 117 U/L       Protein, total  6.8  6.4 - 8.2 g/dL       Albumin  3.2 (L)  3.4 - 5.0 g/dL       Globulin  3.6  2.0 - 4.0 g/dL       A-G Ratio  0.9  0.8 - 1.7         TROPONIN-HIGH SENSITIVITY          Collection Time: 06/20/21 10:05 AM         Result  Value  Ref Range  Troponin-High Sensitivity  29  0 - 78 ng/L           Discussed case with Dr. Reesa Chew, and our impression and recommendations are as follows:   1.  Atrial flutter with slow ventricular rate   a.  Rate is in the 40s to 50s on telemetry while in the ER   b.  He is not on any rate limiting medications   c.  Continue Eliquis for now, but consider stopping this due to dementia and his recurrent falls and risk of intracranial hemorrhage   d.  The patient's daughter states that she and the patient have discussed that he does not want to have any invasive or distressing procedures such as a pacemaker or any other invasive cardiology testing   e.  Recommend to continue conservative management and keep upcoming appointment at [facility] next week         Thank you for involving [place] in the care of this patient.  Please do not hesitate to call me or Dr. Reesa Chew if additional questions arise.

## 2021-06-20 NOTE — ED Notes (Signed)
PT arrived by EMS from [facility] assisted living facility. Pt. Was found by staff at 0800 on the floor in his room with an injury to left knee left elbow. Skin tears present with dried blood, no active bleeding. PT has no recollection of fall. PT alert and oriented at this time.

## 2021-06-21 LAB — EKG 12-LEAD
Atrial Rate: 188 {beats}/min
Atrial Rate: 190 {beats}/min
P Axis: -102 degrees
P Axis: -93 degrees
P-R Interval: 204 ms
P-R Interval: 68 ms
Q-T Interval: 493 ms
Q-T Interval: 545 ms
QRS Duration: 161 ms
QRS Duration: 164 ms
QTc Calculation (Bazett): 463 ms
QTc Calculation (Bazett): 467 ms
R Axis: 10 degrees
R Axis: 41 degrees
T Axis: 35 degrees
T Axis: 54 degrees
Ventricular Rate: 44 {beats}/min
Ventricular Rate: 53 {beats}/min

## 2021-06-21 LAB — EKG, 12 LEAD, INITIAL
Atrial Rate: 190 {beats}/min
Calculated P Axis: -93 degrees
Calculated R Axis: 41 degrees
Calculated T Axis: 54 degrees
P-R Interval: 68 ms
Q-T Interval: 493 ms
QRS Duration: 161 ms
QTC Calculation (Bezet): 463 ms
Ventricular Rate: 53 {beats}/min

## 2021-06-21 LAB — EKG, 12 LEAD, SUBSEQUENT
Atrial Rate: 188 {beats}/min
Calculated P Axis: -102 degrees
Calculated R Axis: 10 degrees
Calculated T Axis: 35 degrees
P-R Interval: 204 ms
Q-T Interval: 545 ms
QRS Duration: 164 ms
QTC Calculation (Bezet): 467 ms
Ventricular Rate: 44 {beats}/min

## 2021-06-22 NOTE — Telephone Encounter (Signed)
Pt called in to have orders for urine culture to be sent out to [facility] in [place], [place].

## 2021-07-10 ENCOUNTER — Ambulatory Visit: Attending: Family Medicine | Primary: Family Medicine

## 2021-07-10 ENCOUNTER — Ambulatory Visit: Admit: 2021-07-10 | Discharge: 2021-07-10 | Payer: MEDICARE | Attending: Family Medicine | Primary: Family Medicine

## 2021-07-10 DIAGNOSIS — W19XXXA Unspecified fall, initial encounter: Secondary | ICD-10-CM

## 2021-07-10 DIAGNOSIS — R339 Retention of urine, unspecified: Secondary | ICD-10-CM

## 2021-07-10 NOTE — Progress Notes (Signed)
Matthew Chandler presents today for   Chief Complaint   Patient presents with    New Patient       Is someone accompanying this pt? Yes, daughter    Is the patient using any DME equipment during OV? Yes, walker    Depression Screening:  3 most recent PHQ Screens 07/10/2021   PHQ Not Done -   Little interest or pleasure in doing things Not at all   Feeling down, depressed, irritable, or hopeless Not at all   Total Score PHQ 2 0       Learning Assessment:  Learning Assessment 06/25/2019   PRIMARY LEARNER Patient   HIGHEST LEVEL OF EDUCATION - PRIMARY LEARNER  4 YEARS OF COLLEGE   BARRIERS PRIMARY LEARNER NONE   CO-LEARNER CAREGIVER No   PRIMARY LANGUAGE ENGLISH   LEARNER PREFERENCE PRIMARY READING   ANSWERED BY patient   RELATIONSHIP SELF       Fall Risk  Fall Risk Assessment, last 12 mths 07/10/2021   Able to walk? Yes   Fall in past 12 months? 1   Do you feel unsteady? 1   Are you worried about falling 1   Is TUG test greater than 12 seconds? 1   Is the gait abnormal? 1   Number of falls in past 12 months 2   Fall with injury? 0       ADL  ADL Assessment 07/10/2021   Feeding yourself No Help Needed   Getting from bed to chair Help Needed   Getting dressed Help Needed   Bathing or showering Help Needed   Walk across the room (includes cane/walker) Help Needed   Using the telphone Help Needed   Taking your medications Help Needed   Preparing meals Help Needed   Managing money (expenses/bills) Help Needed   Moderately strenuous housework (laundry) Help Needed   Shopping for personal items (toiletries/medicines) Help Needed   Shopping for groceries Help Needed   Driving Help Needed   Climbing a flight of stairs Help Needed   Getting to places beyond walking distances Help Needed       Health Maintenance reviewed and discussed and ordered per Provider.    Health Maintenance Due   Topic Date Due    Pneumococcal 65+ years (1 - PCV) Never done    DTaP/Tdap/Td series (1 - Tdap) Never done    Shingrix Vaccine Age 70> (1 of 2) Never  done    Medicare Yearly Exam  Never done    Flu Vaccine (1) 06/28/2021   .      Coordination of Care:  1. "Have you been to the ER, urgent care clinic since your last visit?  Hospitalized since your last visit?" Yes When: 05/2021 Where: [facility] fall    2. "Have you seen or consulted any other health care providers outside of the [facility] since your last visit?" Yes Where: Dr America Brown neurologist      3. For patients aged 7-75: Has the patient had a colonoscopy? NA - based on age     If the patient is male:    4. For patients aged 74-74: Has the patient had a mammogram within the past 2 years? NA - based on age    43. For patients aged 21-65: Has the patient had a pap smear? NA - based on age

## 2021-07-10 NOTE — Progress Notes (Signed)
Matthew Chandler (DOB: 1923/11/22) is a 87 y.o. male here for evaluation of the following chief concern(s):  New Patient; previously under the care of Dr. Julio Chandler [facility]).  Hospital follow-up  Chronic condition management    ASSESSMENT/PLAN:  1. Fall, initial encounter  2. Impaired mobility and ADLs  3. Urinary retention  -     [place] PELV NON OBS  LTD; Future  -     URINALYSIS W/ REFLEX CULTURE  4. Abnormal chest x-ray  -     XR CHEST PA LAT; Future  5. Mild protein-calorie malnutrition (HCC)  -     FERRITIN  -     CBC WITH AUTOMATED DIFF  -     VITAMIN B1, WHOLE BLOOD  -     MAGNESIUM  -     METABOLIC PANEL, BASIC  -     PHOSPHORUS  -     TSH 3RD GENERATION  6. Stage 3b chronic kidney disease (HCC)  -     CBC WITH AUTOMATED DIFF  -     MAGNESIUM  -     METABOLIC PANEL, BASIC  -     PHOSPHORUS  7. Chronic diastolic heart failure (Matthew Chandler)  8. Anemia, unspecified type  -     IRON PROFILE  -     FERRITIN  -     CBC WITH AUTOMATED DIFF  9. Cognitive changes  -     VITAMIN B1, WHOLE BLOOD  -     VITAMIN B12  10. Advanced age  8. Imbalance  -     VITAMIN B12    Matthew Chandler appears medically stable at this time, normotensive, stable hemodynamics w/ baseline bradycardia.  I recommend that he remain in Physical Therapy for strength/balance/endurance training, fall risk reduction.   PVR bladder scan given urinary frequency, hx retention- forward to Urologist for review, UA today looks clean.  No clinical signs/symptoms of pneumonia, recent CXR w/ basilar opacities may represent resolved pneumonitis, scarring; will re-check for progression.  Update labs as above.  We initiated goals of care discussion given medical complexity of [name]' case and in the setting of his advance age.    No follow-ups on file.    Matthew Chandler agrees with plan as above and has no additional questions at this time.     SUBJECTIVE/OBJECTIVE:  Matthew Chandler, 87 y.o. male CAD s/p CABG (8/14), chronic diastolic heart failure, aortic valve replacement  bioprosthetic, atrial flutter on Eliquis, spinal stenosis, HTN, CKD, hyponatremia, urinary retention, depression, recent COVID s/p Paxlovid.      Per chart review;  06/20/21 pt was seen in the ER after found down;  CT head: No acute intracranial abnormality.  BRAIN AND POSTERIOR FOSSA: Atrophy. Patchy periventricular, deep and subcortical   white matter hypoattenuation which is nonspecific but likely represents chronic   ischemic changes.  No evidence of acute large vessel transcortical infarct or   acute parenchymal hemorrhage. No midline shift or hydrocephalus.     CXR: Bilateral lower lobe parenchymal opacities, may reflect aspiration or pneumonia.  EKG w/ 2nd degree AVB, prolonged QRS.    He is accompanied to clinic today by his daughter/mPOA, Matthew Chandler, who assists w/ HPI at pt request.      Overall, pt is feeling.  Acute concerns: Urinary frequency, impaired mobility, frailty.  Pt lives at the [facility], daughter is ~1 hour away and active in his care planning.    No recurrent falls, enrolled in PT, using  a walker which is new.  He does not have any dysphagia, dyspnea, cough, fever/chills, malaise.      Past Medical History:   Diagnosis Date    Anemia, unspecified     Aortic valve replaced     Bacteremia 12/2015    MSSA    Benign non-nodular prostatic hyperplasia with lower urinary tract symptoms     Benign prostatic hyperplasia with lower urinary tract symptoms     Chronic diastolic heart failure (HCC)     Chronic idiopathic constipation     Generalized edema     GERD without esophagitis     Gross hematuria     History of aortic valve replacement with bioprosthetic valve     History of depression     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)     Hypertension, essential 11/12/2017    Hypokalemia     Hyponatremia with excess extracellular fluid volume     Long-term use of high-risk medication     Muscle cramps     Muscular deconditioning     Reactive depression     Spinal stenosis of lumbar region      Typical atrial flutter (HCC)     Unspecified osteoarthritis, unspecified site 03/03/2019    Urinary retention     Urinary urgency      Past Surgical History:   Procedure Laterality Date    HX APPENDECTOMY      HX CATARACT REMOVAL      HX CORONARY ARTERY BYPASS GRAFT  05/2013    HX OTHER SURGICAL      NECK SURGERY    HX TONSILLECTOMY      HX UROLOGICAL  05/14/2016    [place] - Cystoscopy and UroLift (transprostatic implants #10), Dr Matthew Chandler     History reviewed. No pertinent family history.    Social History     Socioeconomic History    Marital status: WIDOWED     Spouse name: Not on file    Number of children: Not on file    Years of education: Not on file    Highest education level: Not on file   Occupational History    Not on file   Tobacco Use    Smoking status: Never    Smokeless tobacco: Never   Substance and Sexual Activity    Alcohol use: Never    Drug use: Never    Sexual activity: Not on file   Other Topics Concern    Not on file   Social History Narrative    Not on file     Social Determinants of Health     Financial Resource Strain: Not on file   Food Insecurity: Not on file   Transportation Needs: Not on file   Physical Activity: Not on file   Stress: Not on file   Social Connections: Not on file   Intimate Partner Violence: Not on file   Housing Stability: Not on file     Social History     Tobacco Use   Smoking Status Never   Smokeless Tobacco Never       Current Outpatient Medications   Medication Sig Dispense Refill    loratadine (CLARITIN) 10 mg tablet Take 10 mg by mouth daily.      finasteride (PROSCAR) 5 mg tablet Take 1 Tablet by mouth daily. 90 Tablet 3    mirabegron ER (Myrbetriq) 50 mg ER tablet Take 1 Tablet by mouth daily. 90 Tablet 3  silodosin (RAPAFLO) 8 mg capsule Take 1 Capsule by mouth daily (with dinner). 90 Capsule 3    nitroglycerin (NITROSTAT) 0.4 mg SL tablet 0.4 mg by SubLINGual route every five (5) minutes as needed. Up to 3 doses.      acetaminophen (TYLENOL) 500 mg tablet  Take  by mouth every eight (8) hours as needed.      azelastine (ASTELIN) 137 mcg (0.1 %) nasal spray 1 Spray two (2) times a day. Use in each nostril as directed      ammonium lactate (AMLACTIN) 12 % topical cream Apply  to affected area two (2) times a day. rub in to affected area well      apixaban (ELIQUIS) 2.5 mg tablet 2.5 mg.      citalopram (CELEXA) 10 mg tablet TK 1 T PO QD IN THE MORNING  11    folic acid (FOLVITE) 1 mg tablet TK 1 T PO QD  3    furosemide (LASIX) 40 mg tablet TAKE ONE-HALF TABLET PO PRF FLUID OR EDEMA  9    omeprazole (PRILOSEC) 20 mg capsule Take 20 mg by mouth daily.      hydrocortisone (CORTIZONE) 0.5 % ointment Apply  to affected area two (2) times a day. use thin layer      VIT A/VIT C/VIT E/ZINC/COPPER (PRESERVISION AREDS PO) Take  by mouth.      magnesium oxide (MAG-OX) 400 mg tablet Take 400 mg by mouth daily.       Allergies   Allergen Reactions    Statins-Hmg-Coa Reductase Inhibitors Unknown (comments)       BP 134/66 (BP 1 Location: Left upper arm, BP Patient Position: Sitting, BP Cuff Size: Adult)    Pulse (!) 57    Temp 97.4 ??F (36.3 ??C) (Temporal)    Resp 20    Ht 5' 8" (1.727 m)    Wt 138 lb (62.6 kg)    SpO2 97%    BMI 20.98 kg/m??     Physical Exam  Constitutional:       General: He is not in acute distress.     Comments: Frail, thin appearing elderly gentleman.  Hard of hearing.   HENT:      Head: Normocephalic and atraumatic.      Mouth/Throat:      Mouth: Mucous membranes are moist.      Pharynx: Oropharynx is clear.   Eyes:      General: No scleral icterus.     Conjunctiva/sclera: Conjunctivae normal.      Pupils: Pupils are equal, round, and reactive to light.   Cardiovascular:      Rate and Rhythm: Normal rate and regular rhythm.      Pulses: Normal pulses.      Heart sounds: Normal heart sounds.   Pulmonary:      Effort: No respiratory distress.      Breath sounds: Normal breath sounds.   Abdominal:      General: Bowel sounds are normal.      Palpations: Abdomen is  soft.      Tenderness: There is no abdominal tenderness.   Musculoskeletal:      Right lower leg: No edema.      Left lower leg: No edema.      Comments: Mild-moderate diffuse muscle atrophy.   Skin:     General: Skin is warm and dry.   Neurological:      Mental Status: He is alert.       Lab  Results   Component Value Date/Time    WBC 7.8 06/20/2021 10:05 AM    HGB 11.0 (L) 06/20/2021 10:05 AM    HCT 32.4 (L) 06/20/2021 10:05 AM    PLATELET 74 (L) 06/20/2021 10:05 AM    MCV 95.3 06/20/2021 10:05 AM     Lab Results   Component Value Date/Time    Sodium 133 (L) 06/20/2021 10:05 AM    Potassium 4.2 06/20/2021 10:05 AM    Chloride 97 (L) 06/20/2021 10:05 AM    CO2 30 06/20/2021 10:05 AM    Anion gap 6 06/20/2021 10:05 AM    Glucose 93 06/20/2021 10:05 AM    BUN 34 (H) 06/20/2021 10:05 AM    Creatinine 1.91 (H) 06/20/2021 10:05 AM    BUN/Creatinine ratio 18 06/20/2021 10:05 AM    GFR est AA 40 (L) 06/20/2021 10:05 AM    GFR est non-AA 33 (L) 06/20/2021 10:05 AM    Calcium 8.8 06/20/2021 10:05 AM    Bilirubin, total 0.5 06/20/2021 10:05 AM    Alk. phosphatase 83 06/20/2021 10:05 AM    Protein, total 6.8 06/20/2021 10:05 AM    Albumin 3.2 (L) 06/20/2021 10:05 AM    Globulin 3.6 06/20/2021 10:05 AM    A-G Ratio 0.9 06/20/2021 10:05 AM    ALT (SGPT) 17 06/20/2021 10:05 AM    AST (SGOT) 16 06/20/2021 10:05 AM     No results found for: CHOL, CHOLPOCT, CHOLX, CHLST, CHOLV, TOTCHOLEXT, HDL, HDLPOC, HDLEXT, HDLP, LDL, LDLCPOC, LDLCEXT, LDLC, DLDLP, VLDLC, VLDL, TGLX, TRIGL, TRIGLYCEXT, TRIGP, TGLPOCT, CHHD, CHHDX    On this date 07/10/21 I have spent >45 minutes reviewing previous notes, test results and face to face with the patient for interview/exam, discussing working diagnosis and treatment plan w/ pt and his daughter.    Medical decision making complexity: moderate-high.    Enid Cutter, MD   Family & Geriatric Medicine

## 2021-07-11 ENCOUNTER — Inpatient Hospital Stay: Admit: 2021-07-11 | Payer: MEDICARE | Primary: Family Medicine

## 2021-07-11 LAB — URINALYSIS W/ REFLEX CULTURE
Bilirubin, Urine: NEGATIVE
Bilirubin: NEGATIVE
Blood, Urine: NEGATIVE
Blood: NEGATIVE
Glucose, Ur: NEGATIVE mg/dL
Glucose: NEGATIVE mg/dL
Ketone: NEGATIVE mg/dL
Ketones, Urine: NEGATIVE mg/dL
Leukocyte Esterase, Urine: NEGATIVE
Leukocyte Esterase: NEGATIVE
Nitrite, Urine: NEGATIVE
Nitrites: NEGATIVE
Protein, UA: NEGATIVE mg/dL
Protein: NEGATIVE mg/dL
Specific Gravity, UA: 1.008 (ref 1.005–1.030)
Specific gravity: 1.008 (ref 1.005–1.030)
Urobilinogen, UA, POCT: 0.2 EU/dL (ref 0.2–1.0)
Urobilinogen: 0.2 U/dL (ref 0.2–1.0)
pH (UA): 8 (ref 5.0–8.0)
pH, UA: 8 (ref 5.0–8.0)

## 2021-07-17 ENCOUNTER — Ambulatory Visit: Attending: Physician Assistant | Primary: Family Medicine

## 2021-07-17 ENCOUNTER — Ambulatory Visit: Admit: 2021-07-17 | Discharge: 2021-07-17 | Attending: Physician Assistant | Primary: Family Medicine

## 2021-07-17 DIAGNOSIS — N401 Enlarged prostate with lower urinary tract symptoms: Secondary | ICD-10-CM

## 2021-07-17 MED ORDER — CEPHALEXIN 250 MG CAP
250 mg | ORAL_CAPSULE | Freq: Three times a day (TID) | ORAL | 0 refills | Status: AC
Start: 2021-07-17 — End: 2021-07-24

## 2021-07-17 NOTE — Telephone Encounter (Signed)
Misty Stanley from [employer] called on behalf of the daughter asking if the results of Mr. Glasheen's urine culture is back that was done last week.

## 2021-07-17 NOTE — Progress Notes (Signed)
Progress Notes by Roselind Rily, PA at 07/17/21 1130                Author: Roselind Rily, PA  Service: --  Author Type: Physician Assistant       Filed: 07/17/21 1242  Encounter Date: 07/17/2021  Status: Signed          Editor: Roselind Rily, PA (Physician Assistant)                    Telemedicine Visit   Matthew Chandler   DOB 05-28-1924    Encounter Date: 07/17/2021          ASSESSMENT:      Encounter Diagnoses              ICD-10-CM  ICD-9-CM          1.  Benign non-nodular prostatic hyperplasia with lower urinary tract symptoms   N40.1  600.91     2.  OAB (overactive bladder)   N32.81  596.51          3.  History of urinary retention   Z87.898  V13.09                  PLAN:     Continue Myrbetriq 50 mg daily   Continue Rapaflo 8mg  daily and Proscar 5 mg daily    Will trial a course of abx to see if sxs improve. Rx for Keflex 250 mg TID provided today.    Explained to daughter that pt needs a PVR to assess for retention.    FU in 1-2 weeks when able to get pt into office for PVR      Visit conducted with daughter via doximity              Chief Complaint       Patient presents with        ?  Benign Prostatic Hypertrophy        ?  Urinary Frequency              HISTORY OF PRESENT ILLNESS:  Matthew Chandler is a 87 y.o. male who presents in follow up for BPH.       He is s/p UroLift 05/14/16. He had post op voiding trial on 06/03/16. He failed to voiding trial post op. Passed VT on 06/03/2016. Continues to have some urinary frequency and urgency. Nocturia 4-5x. Notes he is on Lasix and has some frequency after he takes  it.      Pt last seen by Dr. 08/03/2016 01/11/21 and was doing well at that time.       UA reflex to cutlure was done 07/11/21 by PCP, 1+ bacteria noted however did not meet clinical criteria for Ucx. Pelvic 07/13/21 was also ordered by PCP however it appears pt's daughter declined to schedule.       Today's visit conducted with patient's daughter via [employer].  She states over the past few months patient's  physical and mental capabilities have significantly declined.  He has had multiple falls.  Patient is having very bothersome urinary frequency  and urgency.  Often times having complete loss of urine prior to being able to make it to the toilet.  Daughter states patient is wearing 10+ depends per day and is very aggravated by this.  Patient with prior history of urinary retention with Foley catheter.   Daughter states patient was unable to tolerate the Foley at that time.  Patient  has not been treated with antibiotics since onset of symptoms.      Patient does continue on Myrbetriq 50 mg daily, Proscar 5 mg daily, Rapaflo 8 mg daily.      Pre Op:   Work up included AUA score in the severe range with refractory retention. UDS confirmed obstruction with some bladder function TRUS Vol: ~80 gms.          PMHx, PSHx, SOCHx, FAMHx:   Unchanged as documented on 07/17/21        Past Medical History:        Diagnosis  Date         ?  Anemia, unspecified       ?  Aortic valve replaced       ?  Bacteremia  12/2015          MSSA         ?  Benign non-nodular prostatic hyperplasia with lower urinary tract symptoms       ?  Benign prostatic hyperplasia with lower urinary tract symptoms       ?  Chronic diastolic heart failure (HCC)       ?  Chronic idiopathic constipation       ?  Generalized edema       ?  GERD without esophagitis       ?  Gross hematuria       ?  History of aortic valve replacement with bioprosthetic valve       ?  History of depression       ?  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  Hypertension, essential  11/12/2017     ?  Hypokalemia       ?  Hyponatremia with excess extracellular fluid volume       ?  Long-term use of high-risk medication       ?  Muscle cramps       ?  Muscular deconditioning       ?  Reactive depression       ?  Spinal stenosis of lumbar region       ?  Typical atrial flutter (HCC)       ?  Unspecified osteoarthritis, unspecified site  03/03/2019     ?  Urinary  retention           ?  Urinary urgency            Past Surgical History:         Procedure  Laterality  Date          ?  HX APPENDECTOMY         ?  HX CATARACT REMOVAL         ?  HX CORONARY ARTERY BYPASS GRAFT    05/2013     ?  HX OTHER SURGICAL              NECK SURGERY          ?  HX TONSILLECTOMY         ?  HX UROLOGICAL    05/14/2016          [facility] - Cystoscopy and UroLift (transprostatic implants #10), Dr Asher Muir          Social History          Socioeconomic History         ?  Marital status:  WIDOWED  Spouse name:  Not on file         ?  Number of children:  Not on file     ?  Years of education:  Not on file     ?  Highest education level:  Not on file       Occupational History        ?  Not on file       Tobacco Use         ?  Smoking status:  Never     ?  Smokeless tobacco:  Never       Substance and Sexual Activity         ?  Alcohol use:  Never     ?  Drug use:  Never     ?  Sexual activity:  Not on file        Other Topics  Concern        ?  Not on file       Social History Narrative        ?  Not on file          Social Determinants of Health          Financial Resource Strain: Not on file     Food Insecurity: Not on file     Transportation Needs: Not on file     Physical Activity: Not on file     Stress: Not on file     Social Connections: Not on file     Intimate Partner Violence: Not on file       Housing Stability: Not on file        No family history on file.     Allergies        Allergen  Reactions         ?  Statins-Hmg-Coa Reductase Inhibitors  Unknown (comments)          Current Outpatient Medications          Medication  Sig  Dispense  Refill           ?  cephALEXin (KEFLEX) 250 mg capsule  Take 1 Capsule by mouth three (3) times daily for 7 days.  21 Capsule  0     ?  loratadine (CLARITIN) 10 mg tablet  Take 10 mg by mouth daily.         ?  finasteride (PROSCAR) 5 mg tablet  Take 1 Tablet by mouth daily.  90 Tablet  3     ?  mirabegron ER (Myrbetriq) 50 mg ER tablet  Take 1  Tablet by mouth daily.  90 Tablet  3     ?  silodosin (RAPAFLO) 8 mg capsule  Take 1 Capsule by mouth daily (with dinner).  90 Capsule  3     ?  nitroglycerin (NITROSTAT) 0.4 mg SL tablet  0.4 mg by SubLINGual route every five (5) minutes as needed. Up to 3 doses.         ?  acetaminophen (TYLENOL) 500 mg tablet  Take  by mouth every eight (8) hours as needed.               ?  azelastine (ASTELIN) 137 mcg (0.1 %) nasal spray  1 Spray two (2) times a day. Use in each nostril as directed               ?  ammonium lactate (AMLACTIN) 12 % topical  cream  Apply  to affected area two (2) times a day. rub in to affected area well         ?  apixaban (ELIQUIS) 2.5 mg tablet  2.5 mg.         ?  citalopram (CELEXA) 10 mg tablet  TK 1 T PO QD IN THE MORNING    11     ?  folic acid (FOLVITE) 1 mg tablet  TK 1 T PO QD    3     ?  furosemide (LASIX) 40 mg tablet  TAKE ONE-HALF TABLET PO PRF FLUID OR EDEMA    9     ?  omeprazole (PRILOSEC) 20 mg capsule  Take 20 mg by mouth daily.         ?  hydrocortisone (CORTIZONE) 0.5 % ointment  Apply  to affected area two (2) times a day. use thin layer         ?  VIT A/VIT C/VIT E/ZINC/COPPER (PRESERVISION AREDS PO)  Take  by mouth.               ?  magnesium oxide (MAG-OX) 400 mg tablet  Take 400 mg by mouth daily.               Review of Systems   Constitutional: Fever:    Skin: Rash:    HEENT: Hearing difficulty:    Eyes: Blurred vision:    Cardiovascular: Chest pain:    Respiratory: Shortness of breath:    Gastrointestinal: Nausea/vomiting:    Musculoskeletal: Back pain:    Neurological: Weakness:    Psychological: Memory loss:    Comments/additional findings:             REVIEW OF LABS AND IMAGING:     No urine specimen.             A copy of today's office visit with all pertinent imaging results and labs were sent to the referring physician.     CC: Quentin Angst, MD       Roselind Rily, [place]                  Matthew Chandler, was evaluated through a synchronous (real-time)  audio-video encounter. The patient (or guardian if applicable) is aware that this is a billable service, which includes applicable  co-pays. This Virtual Visit was conducted with patient's (and/or legal guardian's) consent. The visit was conducted pursuant to the emergency declaration under the [employer] and the [employer], 1135 waiver authority and the [employer] Act. It is within this context (and with the understanding that this method of patient encounter is in the patient's best interest as well as the health and safety of other patients and the public)  that telehealth is being provided for this patient encounter rather than a face-to-face visit. This patient encounter is appropriate and reasonable under the circumstances given the patient's particular presentation at this time. The patient has been  advised of the potential risks and limitations of this mode of treatment (including, but not limited to, the absence of in-person examination) and has agreed to be treated in a remote fashion. The patient has also been advised to contact this office for  worsening conditions or problems, and seek emergency medical treatment and/or call 911 if the patient deems either necessary. Patient identification was verified, and a caregiver was present when appropriate, prior to the initiation of the  visit.

## 2021-07-17 NOTE — Telephone Encounter (Signed)
Please advise

## 2021-07-30 ENCOUNTER — Encounter: Attending: Urology | Primary: Family Medicine

## 2021-07-31 ENCOUNTER — Ambulatory Visit: Attending: Urology | Primary: Family Medicine

## 2021-07-31 ENCOUNTER — Ambulatory Visit: Admit: 2021-07-31 | Discharge: 2021-07-31 | Payer: MEDICARE | Attending: Urology | Primary: Family Medicine

## 2021-07-31 DIAGNOSIS — Z87898 Personal history of other specified conditions: Secondary | ICD-10-CM

## 2021-07-31 LAB — AMB POC URINALYSIS DIP STICK AUTO W/O MICRO
Bilirubin (UA POC): NEGATIVE
Bilirubin, Urine, POC: NEGATIVE
Blood (UA POC): NEGATIVE
Blood (UA POC): NEGATIVE
Glucose (UA POC): NEGATIVE
Glucose, Urine, POC: NEGATIVE
Ketones (UA POC): NEGATIVE
Ketones, Urine, POC: NEGATIVE
Leukocyte Esterase, Urine, POC: NEGATIVE
Leukocyte esterase (UA POC): NEGATIVE
Nitrite, Urine, POC: NEGATIVE
Nitrites (UA POC): NEGATIVE
Specific Gravity, Urine, POC: 1.015 NA (ref 1.001–1.035)
Specific gravity (UA POC): 1.015 (ref 1.001–1.035)
Urobilinogen (UA POC): 0.2 (ref 0.2–1)
Urobilinogen, POC: 0.2 (ref 0.2–1)
pH (UA POC): 6 (ref 4.6–8.0)
pH, Urine, POC: 6 NA (ref 4.6–8.0)

## 2021-07-31 LAB — AMB POC PVR, MEAS,POST-VOID RES,US,NON-IMAGING
PVR POC: 0 cc
PVR: 0 mL

## 2021-07-31 NOTE — Progress Notes (Signed)
Progress  Notes by Lurlean Nanny, MD at 07/31/21 1430                Author: Lurlean Nanny, MD  Service: --  Author Type: Physician       Filed: 08/13/21 1009  Encounter Date: 07/31/2021  Status: Signed          Editor: Lurlean Nanny, MD (Physician)                       Follow-up Visit                IMP:              ICD-10-CM  ICD-9-CM             1.  History of urinary retention   Z87.898  V13.09  AMB POC PVR, MEAS,POST-VOID RES,US,NON-IMAGING                AMB POC URINALYSIS DIP STICK AUTO W/O MICRO                  2.  Benign non-nodular prostatic hyperplasia with lower urinary tract symptoms   N40.1  600.91                    3.  OAB (overactive bladder)   N32.81  596.51                                  PLAN:    ??  PVR today is 0 cc   ??  UA today is negative   ??  We discussed condom catheter, patient still would like to avoid   ??  Continue Myrbetriq 50 mg daily, Proscar 5 mg daily, Rapaflo 8 mg daily   ??  Attempt cx when possible but ok to rx empirically for UTI concern        Follow-up and Dispositions      ??  Return if symptoms worsen or fail to improve.                         Chief Complaint       Patient presents with        ?  Follow-up               HPI: Matthew Chandler is a 87 y.o. WHITE/NON-HISPANIC male  who presents today in follow up for an established diagnosis of BPH.       He is s/p UroLift 05/14/16. He had post op voiding trial on 06/03/16. He failed to voiding trial post op. Passed VT on 06/03/2016. Continues to have some urinary frequency and urgency. Nocturia 4-5x. Notes he is on Lasix and has some frequency after he takes  it.      UA reflex to culture was done 07/11/21 by PCP, 1+ bacteria noted however did not meet clinical criteria for Ucx. Pelvic US was also ordered by PCP however it appears pt's daughter declined to schedule.       Patient and daughter report marked frequency and urgency. Wearing pads now, would like to avoid catheter. Has a walker but does not use this at night.  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 Continues on Myrbetriq 50 mg daily, Proscar 5 mg daily, Rapaflo 8 mg daily. Daughter  notes abrupt change in behavior with UTI  and pt responds promptly to Antibx.       Pre Op:   Work up included AUA score in the severe range with refractory retention. UDS confirmed obstruction with some bladder function TRUS Vol: ~80 gms.              AUA Assessment Score:    ;     AUA Bother Rating:             Current Outpatient Medications          Medication  Sig  Dispense  Refill           ?  loratadine (CLARITIN) 10 mg tablet  Take 10 mg by mouth daily.         ?  finasteride (PROSCAR) 5 mg tablet  Take 1 Tablet by mouth daily.  90 Tablet  3     ?  mirabegron ER (Myrbetriq) 50 mg ER tablet  Take 1 Tablet by mouth daily.  90 Tablet  3     ?  silodosin (RAPAFLO) 8 mg capsule  Take 1 Capsule by mouth daily (with dinner).  90 Capsule  3     ?  nitroglycerin (NITROSTAT) 0.4 mg SL tablet  0.4 mg by SubLINGual route every five (5) minutes as needed. Up to 3 doses.         ?  acetaminophen (TYLENOL) 500 mg tablet  Take  by mouth every eight (8) hours as needed.         ?  azelastine (ASTELIN) 137 mcg (0.1 %) nasal spray  1 Spray two (2) times a day. Use in each nostril as directed         ?  ammonium lactate (AMLACTIN) 12 % topical cream  Apply  to affected area two (2) times a day. rub in to affected area well         ?  apixaban (ELIQUIS) 2.5 mg tablet  2.5 mg.         ?  citalopram (CELEXA) 10 mg tablet  TK 1 T PO QD IN THE MORNING    11     ?  folic acid (FOLVITE) 1 mg tablet  TK 1 T PO QD    3     ?  furosemide (LASIX) 40 mg tablet  TAKE ONE-HALF TABLET PO PRF FLUID OR EDEMA    9     ?  omeprazole (PRILOSEC) 20 mg capsule  Take 20 mg by mouth daily.               ?  hydrocortisone (CORTIZONE) 0.5 % ointment  Apply  to affected area two (2) times a day. use thin layer               ?  VIT A/VIT C/VIT E/ZINC/COPPER (PRESERVISION AREDS PO)  Take  by mouth.               ?  magnesium oxide (MAG-OX) 400 mg tablet  Take  400 mg by mouth daily.               All:     Allergies        Allergen  Reactions         ?  Statins-Hmg-Coa Reductase Inhibitors  Unknown (comments)             Past Medical History:        Diagnosis  Date         ?  Anemia, unspecified       ?  Aortic valve replaced       ?  Bacteremia  12/2015          MSSA         ?  Benign non-nodular prostatic hyperplasia with lower urinary tract symptoms       ?  Benign prostatic hyperplasia with lower urinary tract symptoms       ?  Chronic diastolic heart failure (HCC)       ?  Chronic idiopathic constipation       ?  Generalized edema       ?  GERD without esophagitis       ?  Gross hematuria       ?  History of aortic valve replacement with bioprosthetic valve       ?  History of depression       ?  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  Hypertension, essential  11/12/2017     ?  Hypokalemia       ?  Hyponatremia with excess extracellular fluid volume       ?  Long-term use of high-risk medication       ?  Muscle cramps       ?  Muscular deconditioning       ?  Reactive depression       ?  Spinal stenosis of lumbar region       ?  Typical atrial flutter (HCC)       ?  Unspecified osteoarthritis, unspecified site  03/03/2019     ?  Urinary retention           ?  Urinary urgency               Past Surgical History:         Procedure  Laterality  Date          ?  HX APPENDECTOMY         ?  HX CATARACT REMOVAL         ?  HX CORONARY ARTERY BYPASS GRAFT    05/2013     ?  HX OTHER SURGICAL              NECK SURGERY          ?  HX TONSILLECTOMY         ?  HX UROLOGICAL    05/14/2016          [facility] - Cystoscopy and UroLift (transprostatic implants #10), Dr Asher Muir              Review of Systems   Constitutional: Fever:    Skin: Rash:    HEENT: Hearing difficulty:    Eyes: Blurred vision:    Cardiovascular: Chest pain:    Respiratory: Shortness of breath:    Gastrointestinal: Nausea/vomiting:    Musculoskeletal: Back pain:    Neurological: Weakness:     Psychological: Memory loss:    Comments/additional findings:          PHYSICAL EXAM:        Visit Vitals      Ht  5' 8.5" (1.74 m)     Wt  138 lb (62.6 kg)        BMI  20.68 kg/m??        Constitutional: WDWN, Pleasant and appropriate affect, No acute distress.   Resp:  No SOB, no breathing difficulty   Skin: Normal color and texture and No rashes or erythema noted   Neuro/Psych:  Alert and Oriented x3, affect appropriate.        Lab:     Results for orders placed or performed in visit on 07/31/21     AMB POC PVR, MEAS,POST-VOID RES,US,NON-IMAGING         Result  Value  Ref Range            PVR  0  cc       AMB POC URINALYSIS DIP STICK AUTO W/O MICRO         Result  Value  Ref Range            Color (UA POC)  Yellow         Clarity (UA POC)  Clear         Glucose (UA POC)  Negative  Negative       Bilirubin (UA POC)  Negative  Negative       Ketones (UA POC)  Negative  Negative       Specific gravity (UA POC)  1.015  1.001 - 1.035       Blood (UA POC)  Negative  Negative       pH (UA POC)  6.0  4.6 - 8.0       Protein (UA POC)  Trace  Negative       Urobilinogen (UA POC)  0.2 mg/dL  0.2 - 1       Nitrites (UA POC)  Negative  Negative            Leukocyte esterase (UA POC)  Negative  Negative            No results found for: PSA, Pollie Friar, [id number], [id number]      Lurlean Nanny, MD

## 2021-08-17 NOTE — Telephone Encounter (Signed)
[facility] called and said resident lost his balance and fell. He is ok but he does have a skin tear on his Right inner arm.

## 2021-09-08 ENCOUNTER — Emergency Department: Admit: 2021-09-08 | Payer: MEDICARE | Primary: Family Medicine

## 2021-09-08 ENCOUNTER — Inpatient Hospital Stay
Admit: 2021-09-08 | Discharge: 2021-09-08 | Disposition: A | Discharge status: Home / Self Care | Payer: MEDICARE | Attending: Emergency Medicine

## 2021-09-08 DIAGNOSIS — S0990XA Unspecified injury of head, initial encounter: Secondary | ICD-10-CM

## 2021-09-08 LAB — URINALYSIS W/ RFLX MICROSCOPIC
Bilirubin, Urine: NEGATIVE
Bilirubin: NEGATIVE
Glucose, Ur: NEGATIVE mg/dL
Glucose: NEGATIVE mg/dL
Nitrite, Urine: NEGATIVE
Nitrites: NEGATIVE
Protein, UA: 100 mg/dL — AB
Protein: 100 mg/dL — AB
Specific Gravity, UA: 1.017 (ref 1.005–1.030)
Specific gravity: 1.017 (ref 1.005–1.030)
Urobilinogen, UA, POCT: 0.2 EU/dL (ref 0.2–1.0)
Urobilinogen: 0.2 EU/dL (ref 0.2–1.0)
pH (UA): 6.5 (ref 5.0–8.0)
pH, UA: 6.5 (ref 5.0–8.0)

## 2021-09-08 LAB — URINE MICROSCOPIC

## 2021-09-08 MED ORDER — NITROFURANTOIN (25% MACROCRYSTAL FORM) 100 MG CAP
100 mg | ORAL_CAPSULE | Freq: Two times a day (BID) | ORAL | 0 refills | Status: AC
Start: 2021-09-08 — End: 2021-09-15

## 2021-09-08 NOTE — ED Notes (Signed)
Pt was using the restroom this morning, and fell hit his head, takes Eliquis everyday

## 2021-09-08 NOTE — ED Notes (Signed)
Pt has a skin tear and abrasion to the left elbow. Has a contusion and abrasion to is scalp in the occipital rergiog, additionally has and scabbed over wound to the left anterior lower extremity, with marked redness around the site

## 2021-09-08 NOTE — ED Notes (Signed)
The skin tear to the left elbow was cleaned and then covered with a telfa pad 4x4s and kling dressing, scalp wound was cleaned and left open to air

## 2021-09-08 NOTE — ED Provider Notes (Signed)
ED Provider Notes by Sung Amabile, MD at 09/08/21 1107                Author: Sung Amabile, MD  Service: --  Author Type: Physician       Filed: 09/08/21 1457  Date of Service: 09/08/21 1107  Status: Signed          Editor: Sung Amabile, MD (Physician)                EMERGENCY DEPARTMENT HISTORY AND PHYSICAL EXAM           Date: 09/08/2021   Patient Name: Matthew Chandler        History of Presenting Illness          Chief Complaint       Patient presents with        ?  Fall        ?  Head Injury           History Provided By: Patient      HPI: Matthew Chandler, 87 y.o. male PMHx Multiple medical problems including bradycardia presents with slip and fall in restroom. Hit his head without LOC.  He has some mild pain in back of his head. He has no neck pain or other injuries. No NVD, chest pain or hip pai. He feels back to baseline. He is on Eliquis for Afib.[facility]      There are no other complaints, changes, or physical findings at this time.      PCP: Quentin Angst, MD        Current Outpatient Medications          Medication  Sig  Dispense  Refill           ?  nitrofurantoin, macrocrystal-monohydrate, (Macrobid) 100 mg capsule  Take 1 Capsule by mouth two (2) times a day for 7 days.  14 Capsule  0     ?  loratadine (CLARITIN) 10 mg tablet  Take 10 mg by mouth daily.         ?  finasteride (PROSCAR) 5 mg tablet  Take 1 Tablet by mouth daily.  90 Tablet  3     ?  acetaminophen (TYLENOL) 500 mg tablet  Take  by mouth every eight (8) hours as needed.         ?  azelastine (ASTELIN) 137 mcg (0.1 %) nasal spray  1 Spray two (2) times a day. Use in each nostril as directed         ?  ammonium lactate (AMLACTIN) 12 % topical cream  Apply  to affected area two (2) times a day. rub in to affected area well         ?  apixaban (ELIQUIS) 2.5 mg tablet  2.5 mg.         ?  citalopram (CELEXA) 10 mg tablet  TK 1 T PO QD IN THE MORNING    11     ?  folic acid (FOLVITE) 1  mg tablet  TK 1 T PO QD    3     ?  furosemide (LASIX) 40 mg tablet  TAKE ONE-HALF TABLET PO PRF FLUID OR EDEMA    9     ?  omeprazole (PRILOSEC) 20 mg capsule  Take 20 mg by mouth daily.         ?  hydrocortisone (CORTIZONE) 0.5 % ointment  Apply  to affected area two (2) times a day. use thin layer         ?  VIT A/VIT C/VIT E/ZINC/COPPER (PRESERVISION AREDS PO)  Take  by mouth.         ?  magnesium oxide (MAG-OX) 400 mg tablet  Take 400 mg by mouth daily.         ?  mirabegron ER (Myrbetriq) 50 mg ER tablet  Take 1 Tablet by mouth daily.  90 Tablet  3     ?  silodosin (RAPAFLO) 8 mg capsule  Take 1 Capsule by mouth daily (with dinner).  90 Capsule  3           ?  nitroglycerin (NITROSTAT) 0.4 mg SL tablet  0.4 mg by SubLINGual route every five (5) minutes as needed. Up to 3 doses.                 Past History     Past Medical History:     Past Medical History:        Diagnosis  Date         ?  Anemia, unspecified       ?  Aortic valve replaced       ?  Atrial fibrillation (HCC)       ?  Bacteremia  12/2015          MSSA         ?  Benign non-nodular prostatic hyperplasia with lower urinary tract symptoms       ?  Benign prostatic hyperplasia with lower urinary tract symptoms       ?  Chronic diastolic heart failure (HCC)       ?  Chronic idiopathic constipation       ?  Generalized edema       ?  GERD without esophagitis       ?  Gross hematuria       ?  History of aortic valve replacement with bioprosthetic valve       ?  History of depression       ?  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  Hypertension, essential  11/12/2017     ?  Hypokalemia       ?  Hyponatremia with excess extracellular fluid volume       ?  Long-term use of high-risk medication       ?  Muscle cramps       ?  Muscular deconditioning       ?  Reactive depression       ?  Spinal stenosis of lumbar region       ?  Typical atrial flutter (HCC)       ?  Unspecified osteoarthritis, unspecified site  03/03/2019     ?   Urinary retention           ?  Urinary urgency             Past Surgical History:     Past Surgical History:         Procedure  Laterality  Date          ?  HX APPENDECTOMY         ?  HX CATARACT REMOVAL         ?  HX CORONARY ARTERY BYPASS GRAFT    05/2013     ?  HX  OTHER SURGICAL              NECK SURGERY          ?  HX TONSILLECTOMY         ?  HX UROLOGICAL    05/14/2016          [facility] - Cystoscopy and UroLift (transprostatic implants #10), Dr Asher Muir           Family History:   History reviewed. No pertinent family history.      Social History:     Social History          Tobacco Use         ?  Smoking status:  Never     ?  Smokeless tobacco:  Never       Substance Use Topics         ?  Alcohol use:  Never         ?  Drug use:  Never           Allergies:     Allergies        Allergen  Reactions         ?  Statins-Hmg-Coa Reductase Inhibitors  Unknown (comments)          Review of Systems     Review of Systems    Constitutional: Negative.     HENT: Negative.      Respiratory: Negative.      Cardiovascular: Negative.     Gastrointestinal: Negative.     Genitourinary: Negative.     Neurological: Negative.     All other systems reviewed and are negative.        Physical Exam     Physical Exam   Vitals and nursing note reviewed.    Constitutional:        Appearance: Normal appearance.    HENT:       Head: Normocephalic and atraumatic.    Eyes:       Extraocular Movements: Extraocular movements intact.       Pupils: Pupils are equal, round, and reactive to light.     Cardiovascular:       Rate and Rhythm: Normal rate and regular rhythm.    Pulmonary:       Effort: Pulmonary effort is normal.       Breath sounds: Normal breath sounds.     Abdominal:       Palpations: Abdomen is soft.       Tenderness: There is no abdominal tenderness.     Musculoskeletal:       Cervical back: Normal range of motion and neck supple.     Neurological:       General: No focal deficit present.       Mental Status: He is alert and  oriented to person, place, and time.       Comments: HOH            Lab and Diagnostic Study Results     Labs -         Recent Results (from the past 12 hour(s))       URINALYSIS W/ RFLX MICROSCOPIC          Collection Time: 09/08/21  1:00 PM         Result  Value  Ref Range            Color  Yellow  Appearance  Turbid          Specific gravity  1.017  1.005 - 1.030         pH (UA)  6.5  5.0 - 8.0         Protein  100 (A)  Negative mg/dL       Glucose  Negative  Negative mg/dL       Ketone  Trace (A)  Negative mg/dL       Bilirubin  Negative  Negative         Blood  Large (A)  Negative         Urobilinogen  0.2  0.2 - 1.0 EU/dL       Nitrites  Negative  Negative         Leukocyte Esterase  Large (A)  Negative         URINE MICROSCOPIC          Collection Time: 09/08/21  1:00 PM         Result  Value  Ref Range            WBC  50-100  0 - 4 /hpf       RBC  5-10  0 - 2 /hpf       Epithelial cells  Few  0 - 20 /lpf            Bacteria  3+ (A)  None /hpf           Radiologic Studies -    @LASTXRRESULT @     CT Results  (Last 48 hours)                                       09/08/21 1121    CT HEAD WO CONT  Final result            Impression:           1.  No CT evidence of an acute intracranial abnormality or other findings      related to recent trauma.        2.  Moderate cerebral atrophy/volume loss and periventricular white matter      changes, most commonly seen with chronic microvascular disease.                   Narrative:    EXAM: CT HEAD, WITHOUT IV CONTRAST             INDICATION: Prior fall with traumatic head injury             COMPARISON: 06/20/2021             TECHNIQUE: Multiple axial CT images of the head were obtained extending from the      skull base through the vertex. Additional coronal and sagittal reformations were      also performed. One or more dose reduction techniques were used on this CT:      automated exposure control, adjustment of the mAs and/or kVp according to      patient size,  and iterative reconstruction techniques.  The specific techniques      used on this CT exam have been documented in the patient's electronic medical      record.  Digital Imaging and Communications in Medicine (DICOM) format image      data are available to nonaffiliated external healthcare facilities or entities  on a secure, media free, reciprocally searchable basis with patient      authorization for at least a 58-month period after this study.                    _______________             FINDINGS:             BRAIN AND POSTERIOR FOSSA: There is generalized prominence of the ventricular      system, associated with proportional widening of the cortical cerebral sulci,      compatible with generalized volume loss.  Hazy hypoattenuation identified along      the periventricular white matter, a nonspecific finding which is most commonly      encountered in the setting of chronic microvascular disease.  There is no      intracranial hemorrhage, mass effect, or midline shift.  There are no      significant additional areas of abnormal parenchymal attenuation.             EXTRA-AXIAL SPACES AND MENINGES: There are no abnormal extra-axial fluid      collections.             CALVARIUM: No acute osseous abnormality.             OTHER: The visualized portions of the paranasal sinuses and mastoid air cells      are clear.             _______________                          09/08/21 1121    CT SPINE CERV WO CONT  Final result            Impression:           1.  No acute fracture or subluxation of the cervical spine.      2.  Multilevel degenerative changes, as described.             Please note that this cervical spine CT was performed supine and does not      evaluate for ligamentous injury or instability.  If the patient has persistent      symptoms or if otherwise clinically indicated, erect cervical spine plain films      are recommended.                       Narrative:    EXAM: CT SPINE CERV WO CONT              CLINICAL INDICATION/HISTORY: Prior fall with neck pain             COMPARISON: None.             TECHNIQUE: CT of the cervical spine without intravenous contrast administration.       Coronal and sagittal reformats were generated and reviewed.  One or more dose      reduction techniques were used on this CT: automated exposure control,      adjustment of the mAs and/or kVp according to patient size, and iterative      reconstruction techniques.  The specific techniques used on this CT exam have      been documented in the patient's electronic medical record.  Digital Imaging and      Communications in Medicine (DICOM) format image data are available to  nonaffiliated external healthcare facilities or entities on a secure, media      free, reciprocally searchable basis with patient authorization for at least a      77-month period after this study.                    _______________      FINDINGS:             VERTEBRAE AND DISCS: Mild overall straightening of the normal cervical lordosis.      Trace C4-C5 anterolisthesis of 2 mm and C6-C7 and C7-T1 anterolisthesis, each of      3 mm..  Multilevel degenerative disc space height loss and discogenic endplate      spurring, most prominent C3-C7. Vertebral body heights are preserved.  Mild      degenerative changes of the atlanto-axial articulation.  The posterior elements      are intact.  No fracture or subluxation. Coronal reformations show normal      alignment of articular pillars.  There are no significant areas of bone lucency      or sclerosis.             SPINAL CANAL AND FORAMINA: No high-grade canal stenosis.  Degenerative facet      arthropathy and uncovertebral proliferative changes with associated foraminal      stenoses: Moderate on the right at C2-C3, severe on the left and moderate on the      right at C3-C4, severe on the right and moderate on the left at C4-C5, severe on      the left and moderately severe on the right at C5-C6 and moderate on the  left at      C6-C7.               PREVERTEBRAL SOFT TISSUES: Normal.             VISIBLE PORTIONS OF POSTERIOR FOSSA/BRAIN: Normal.             LUNG APICES: Clear.             OTHER: None.             _______________                                      CXR Results  (Last 48 hours)             None                       Medical Decision Making and ED Course     Differential Diagnosis & Medical Decision Making Provider Note:    Daughter wants him checked for a UTI. No dysuria, pyuria or hematuria. Urine is clear.      - I am the first and primary provider for this patient. I reviewed the vital signs, available nursing notes, past medical history, past surgical history, family history and social history. The patient's  presenting problems have been discussed, and the staff are in agreement with the care plan formulated and outlined with them.  I have encouraged them to ask questions as they arise throughout their visit.      Vital Signs-Reviewed the patient's vital signs.   Patient Vitals for the past 12 hrs:            Temp  Pulse  Resp  BP  SpO2            09/08/21 1353  --  (!) 45  15  (!) 163/68  --            09/08/21 1050  98.3 ??F (36.8 ??C)  (!) 50  18  (!) 155/62  99 %                 ED Course:                   Procedures and Critical Care        Performed by: Sung Amabile, MD   Procedures             Disposition     Disposition: Condition stable and improved   DC- Adult Discharges: All of the diagnostic tests were reviewed and questions answered. Diagnosis, care plan and treatment options were discussed.  The patient understands the instructions and will follow up as directed. The patients results have been  reviewed with them.  They have been counseled regarding their diagnosis.  The patient and family member patient and daughter verbally convey understanding and agreement of the signs, symptoms, diagnosis, treatment and prognosis and additionally agrees  to follow up as recommended with their PCP in 24  - 48 hours.  They also agree with the care-plan and convey that all of their questions have been answered.  I have also put together some discharge instructions for them that include: 1) educational information  regarding their diagnosis, 2) how to care for their diagnosis at home, as well a 3) list of reasons why they would want to return to the ED prior to their follow-up appointment, should their condition change.      DISCHARGE PLAN:   1.      Current Discharge Medication List                 CONTINUE these medications which have NOT CHANGED          Details        loratadine (CLARITIN) 10 mg tablet  Take 10 mg by mouth daily.               finasteride (PROSCAR) 5 mg tablet  Take 1 Tablet by mouth daily.   Qty: 90 Tablet, Refills: 3               acetaminophen (TYLENOL) 500 mg tablet  Take  by mouth every eight (8) hours as needed.               azelastine (ASTELIN) 137 mcg (0.1 %) nasal spray  1 Spray two (2) times a day. Use in each nostril as directed               ammonium lactate (AMLACTIN) 12 % topical cream  Apply  to affected area two (2) times a day. rub in to affected area well               apixaban (ELIQUIS) 2.5 mg tablet  2.5 mg.               citalopram (CELEXA) 10 mg tablet  TK 1 T PO QD IN THE MORNING   Refills: 11               folic acid (FOLVITE) 1 mg tablet  TK 1 T PO QD   Refills: 3  furosemide (LASIX) 40 mg tablet  TAKE ONE-HALF TABLET PO PRF FLUID OR EDEMA   Refills: 9               omeprazole (PRILOSEC) 20 mg capsule  Take 20 mg by mouth daily.               hydrocortisone (CORTIZONE) 0.5 % ointment  Apply  to affected area two (2) times a day. use thin layer               VIT A/VIT C/VIT E/ZINC/COPPER (PRESERVISION AREDS PO)  Take  by mouth.               magnesium oxide (MAG-OX) 400 mg tablet  Take 400 mg by mouth daily.               mirabegron ER (Myrbetriq) 50 mg ER tablet  Take 1 Tablet by mouth daily.   Qty: 90 Tablet, Refills: 3               silodosin (RAPAFLO) 8 mg  capsule  Take 1 Capsule by mouth daily (with dinner).   Qty: 90 Capsule, Refills: 3               nitroglycerin (NITROSTAT) 0.4 mg SL tablet  0.4 mg by SubLINGual route every five (5) minutes as needed. Up to 3 doses.                      2.      Follow-up Information                  Follow up With  Specialties  Details  Why  Contact Info              Quentin Angst, MD  Family Medicine  In 2 days    [address]   [place] [address]   [phone]                     3.  Return to ED if worse    4.      Current Discharge Medication List                 START taking these medications          Details        nitrofurantoin, macrocrystal-monohydrate, (Macrobid) 100 mg capsule  Take 1 Capsule by mouth two (2) times a day for 7 days.   Qty: 14 Capsule, Refills: 0   Start date: 09/08/2021, End date: 09/15/2021                      Remove if admitted/discharged        Diagnosis/Clinical Impression        Clinical Impression:       1.  Closed head injury, initial encounter         2.  Spondylosis of cervical region without myelopathy or radiculopathy         3.  Urinary tract infection without hematuria, site unspecified            Attestations: I, Sung Amabile, MD, am the primary clinician of record.        Please note that this dictation was completed with Dragon, the computer voice recognition software.  Quite often unanticipated grammatical, syntax, homophones, and other interpretive errors are inadvertently  transcribed by the computer software.  Please disregard these errors.  Please excuse any errors that have escaped final proofreading.  Thank you.

## 2021-09-10 NOTE — Telephone Encounter (Signed)
Noted, forwarded to PCP for [facility]

## 2021-09-10 NOTE — Telephone Encounter (Signed)
Terri from [facility] called just to let us know that Matthew Chandler fell on Saturday and went to the ER. He has a gash on the back of his head along with some skin tears and a UTI. He does have an upcoming appt on 10/11/21

## 2021-09-25 ENCOUNTER — Inpatient Hospital Stay: Admit: 2021-09-25 | Primary: Family Medicine

## 2021-09-26 ENCOUNTER — Ambulatory Visit: Admit: 2021-09-26 | Discharge: 2021-09-26 | Payer: MEDICARE | Attending: Family Medicine | Primary: Family Medicine

## 2021-09-26 ENCOUNTER — Inpatient Hospital Stay: Admit: 2021-09-26 | Payer: MEDICARE | Primary: Family Medicine

## 2021-09-26 DIAGNOSIS — R339 Retention of urine, unspecified: Secondary | ICD-10-CM

## 2021-09-26 DIAGNOSIS — N3000 Acute cystitis without hematuria: Secondary | ICD-10-CM

## 2021-09-26 LAB — CBC WITH AUTO DIFFERENTIAL
Basophils %: 0 % (ref 0–2)
Basophils Absolute: 0 10*3/uL (ref 0.0–0.1)
Eosinophils %: 2 % (ref 0–5)
Eosinophils Absolute: 0.2 10*3/uL (ref 0.0–0.4)
Granulocyte Absolute Count: 0.1 10*3/uL — ABNORMAL HIGH (ref 0.00–0.04)
Hematocrit: 26.4 % — ABNORMAL LOW (ref 36.0–48.0)
Hemoglobin: 9 g/dL — ABNORMAL LOW (ref 13.0–16.0)
Immature Granulocytes: 1 % — ABNORMAL HIGH (ref 0–0.5)
Lymphocytes %: 8 % — ABNORMAL LOW (ref 21–52)
Lymphocytes Absolute: 0.9 10*3/uL (ref 0.9–3.6)
MCH: 33.1 PG (ref 24.0–34.0)
MCHC: 34.1 g/dL (ref 31.0–37.0)
MCV: 97.1 FL (ref 78.0–100.0)
MPV: 9 FL — ABNORMAL LOW (ref 9.2–11.8)
Monocytes %: 13 % — ABNORMAL HIGH (ref 3–10)
Monocytes Absolute: 1.3 10*3/uL — ABNORMAL HIGH (ref 0.05–1.2)
NRBC Absolute: 0 10*3/uL (ref 0.00–0.01)
Neutrophils %: 76 % — ABNORMAL HIGH (ref 40–73)
Neutrophils Absolute: 7.9 10*3/uL (ref 1.8–8.0)
Nucleated RBCs: 0 PER 100 WBC
Platelets: 306 10*3/uL (ref 135–420)
RBC: 2.72 M/uL — ABNORMAL LOW (ref 4.35–5.65)
RDW: 13.6 % (ref 11.6–14.5)
WBC: 10.4 10*3/uL (ref 4.6–13.2)

## 2021-09-26 LAB — URINALYSIS WITH MICROSCOPIC
BACTERIA, URINE: NEGATIVE /hpf — AB
Bilirubin, Urine: NEGATIVE
Blood, Urine: NEGATIVE
Glucose, Ur: NEGATIVE mg/dL
Ketones, Urine: NEGATIVE mg/dL
Leukocyte Esterase, Urine: NEGATIVE
Nitrite, Urine: NEGATIVE
Protein, UA: 30 mg/dL — AB
Specific Gravity, UA: 1.019 (ref 1.005–1.030)
Urobilinogen, UA, POCT: 1 EU/dL (ref 0.2–1.0)
pH, UA: 6 (ref 5.0–8.0)

## 2021-09-26 LAB — BASIC METABOLIC PANEL
Anion Gap: 8 mmol/L (ref 3.0–18.0)
BUN: 35 mg/dL — ABNORMAL HIGH (ref 7–18)
Bun/Cre Ratio: 25 — ABNORMAL HIGH (ref 12–20)
CO2: 24 mmol/L (ref 21–32)
Calcium: 8.4 mg/dL — ABNORMAL LOW (ref 8.5–10.1)
Chloride: 102 mmol/L (ref 100–111)
Creatinine: 1.42 mg/dL — ABNORMAL HIGH (ref 0.60–1.30)
ESTIMATED GLOMERULAR FILTRATION RATE: 46 mL/min/{1.73_m2} — ABNORMAL LOW (ref >60)
Glucose: 88 mg/dL (ref 74–99)
Potassium: 4.4 mmol/L (ref 3.5–5.5)
Sodium: 134 mmol/L — ABNORMAL LOW (ref 136–145)

## 2021-09-26 LAB — TSH 3RD GENERATION
TSH: 2.53 u[IU]/mL (ref 0.36–3.74)
TSH: 2.53 u[IU]/mL (ref 0.36–3.74)

## 2021-09-26 LAB — PHOSPHORUS
Phosphorus: 3.1 mg/dL (ref 2.5–4.9)
Phosphorus: 3.1 mg/dL (ref 2.5–4.9)

## 2021-09-26 LAB — MAGNESIUM
Magnesium: 2.4 mg/dL (ref 1.6–2.6)
Magnesium: 2.4 mg/dL (ref 1.6–2.6)

## 2021-09-26 LAB — CBC WITH AUTOMATED DIFF
ABS. BASOPHILS: 0 10*3/uL (ref 0.0–0.1)
ABS. EOSINOPHILS: 0.2 10*3/uL (ref 0.0–0.4)
ABS. IMM. GRANS.: 0.1 10*3/uL — ABNORMAL HIGH (ref 0.00–0.04)
ABS. LYMPHOCYTES: 0.9 10*3/uL (ref 0.9–3.6)
ABS. MONOCYTES: 1.3 10*3/uL — ABNORMAL HIGH (ref 0.05–1.2)
ABS. NEUTROPHILS: 7.9 10*3/uL (ref 1.8–8.0)
ABSOLUTE NRBC: 0 10*3/uL (ref 0.00–0.01)
BASOPHILS: 0 % (ref 0–2)
EOSINOPHILS: 2 % (ref 0–5)
HCT: 26.4 % — ABNORMAL LOW (ref 36.0–48.0)
HGB: 9 g/dL — ABNORMAL LOW (ref 13.0–16.0)
IMMATURE GRANULOCYTES: 1 % — ABNORMAL HIGH (ref 0–0.5)
LYMPHOCYTES: 8 % — ABNORMAL LOW (ref 21–52)
MCH: 33.1 pg (ref 24.0–34.0)
MCHC: 34.1 g/dL (ref 31.0–37.0)
MCV: 97.1 fL (ref 78.0–100.0)
MONOCYTES: 13 % — ABNORMAL HIGH (ref 3–10)
MPV: 9 FL — ABNORMAL LOW (ref 9.2–11.8)
NEUTROPHILS: 76 % — ABNORMAL HIGH (ref 40–73)
NRBC: 0 /100{WBCs}
PLATELET: 306 10*3/uL (ref 135–420)
RBC: 2.72 M/uL — ABNORMAL LOW (ref 4.35–5.65)
RDW: 13.6 % (ref 11.6–14.5)
WBC: 10.4 10*3/uL (ref 4.6–13.2)

## 2021-09-26 LAB — URINALYSIS W/MICROSCOPIC
Bacteria: NEGATIVE /hpf — AB
Bilirubin: NEGATIVE
Blood: NEGATIVE
Glucose: NEGATIVE mg/dL
Ketone: NEGATIVE mg/dL
Leukocyte Esterase: NEGATIVE
Nitrites: NEGATIVE
Protein: 30 mg/dL — AB
Specific gravity: 1.019 (ref 1.005–1.030)
Urobilinogen: 1 U/dL (ref 0.2–1.0)
pH (UA): 6 (ref 5.0–8.0)

## 2021-09-26 LAB — METABOLIC PANEL, BASIC
Anion gap: 8 mmol/L (ref 3.0–18.0)
BUN/Creatinine ratio: 25 — ABNORMAL HIGH (ref 12–20)
BUN: 35 mg/dL — ABNORMAL HIGH (ref 7–18)
CO2: 24 mmol/L (ref 21–32)
Calcium: 8.4 mg/dL — ABNORMAL LOW (ref 8.5–10.1)
Chloride: 102 mmol/L (ref 100–111)
Creatinine: 1.42 mg/dL — ABNORMAL HIGH (ref 0.60–1.30)
Glucose: 88 mg/dL (ref 74–99)
Potassium: 4.4 mmol/L (ref 3.5–5.5)
Sodium: 134 mmol/L — ABNORMAL LOW (ref 136–145)
eGFR: 46 mL/min/{1.73_m2} — ABNORMAL LOW (ref >60)

## 2021-09-26 MED ORDER — BENZONATATE 100 MG CAP
100 mg | ORAL_CAPSULE | Freq: Two times a day (BID) | ORAL | 0 refills | Status: AC | PRN
Start: 2021-09-26 — End: 2021-10-03

## 2021-09-26 MED ORDER — AMOXICILLIN-CLAVULANATE 500 MG-125 MG TAB
500-125 mg | ORAL_TABLET | Freq: Two times a day (BID) | ORAL | 0 refills | Status: AC
Start: 2021-09-26 — End: 2021-10-06

## 2021-09-26 NOTE — Progress Notes (Signed)
Matthew Chandler (DOB: 22-May-1924) is a 87 y.o. male here for evaluation of the following chief concern(s):  ER follow-up for acute and chronic condition management    ASSESSMENT/PLAN:  1. Acute cystitis without hematuria  -     URINALYSIS W/ RFLX MICROSCOPIC  -     CULTURE, URINE  -     amoxicillin-clavulanate (AUGMENTIN) 500-125 mg per tablet; Take 1 Tablet by mouth two (2) times a day for 10 days. Take a "probiotic" of your choice while on this antibiotic., Normal, Disp-20 Tablet, R-0  2. Acute cough  -     benzonatate (TESSALON) 100 mg capsule; Take 1 Capsule by mouth two (2) times daily as needed for Cough for up to 7 days., Normal, Disp-15 Capsule, R-0  3. Impaired mobility and ADLs  -     REFERRAL TO PHYSICAL THERAPY  -     AMB SUPPLY ORDER  4. Frailty syndrome in geriatric patient  -     REFERRAL TO PHYSICAL THERAPY  -     AMB SUPPLY ORDER  5. At high risk for falls  -     REFERRAL TO PHYSICAL THERAPY  -     AMB SUPPLY ORDER  6. Physical deconditioning  -     REFERRAL TO PHYSICAL THERAPY  -     AMB SUPPLY ORDER    Matthew Chandler appears medically stable at this time, afebrile and normal hemodynamics, no hypoxia.      Will treat empirically for UTI, Augmentin w/ renal dosing selected given pt at risk for aspiration pneumonia and new cough.    Follow-up w/ Urology.    I advised standing labs/CXR ordered last visit and repeat UA w/ Ucx to be collected- ideally today.      Trial of therapy services and DME to improve mobility, safety.    Close office follow-up and ER precautions reviewed.     I recommended for daughter to join follow-up visit if possible.      Return in about 1 week (around 10/03/2021) for "sick visit" for follow-up of UTI- 30 min slot if possible.    Matthew Chandler agrees with plan as above and has no additional questions at this time.       SUBJECTIVE/OBJECTIVE:  Matthew Chandler, 87 y.o. male CAD s/p CABG (8/14), chronic diastolic heart failure, aortic valve replacement bioprosthetic, atrial flutter on  Eliquis, spinal stenosis, HTN, CKD, hyponatremia, urinary retention, depression, recent COVID s/p Paxlovid.      07/31/21 PVR bladder scan: 0    07/10/21 UA WNL    Repeat labs ordered last visit not yet completed;  CBC  Iron profile  Ferritin  BMP  Mag  Phos  TSH  B1  B12  CXR    Pt presents w/ [facility] staff- Matthew Chandler (LPN), and Matthew Chandler (personal care aid); baseline pt is not very communicative, HPI gathered from support staff.  Appt made for today given persistent AMS, weakness, fatigue despite treatment w/ abx.  No fevers/chills.   Cough x2 nights- improved w/ Tessalon pearls, but not heard during the day.    Appetite has improved, doing well.    No recurrent falls, pt has had around-the-clock sitters.  No abx allergies.  CrCl ~56m/min.    UTI symptoms:  Pt seen in the ER on 09/08/21 s/p fall, dx UTI and prescribed Nitrofurantoin x7 days.  CT head:  1.  No CT evidence of an acute intracranial abnormality or other findings   related to  recent trauma.     2.  Moderate cerebral atrophy/volume loss and periventricular white matter   changes, most commonly seen with chronic microvascular disease.   CT spine:   1.  No acute fracture or subluxation of the cervical spine.   2.  Multilevel degenerative changes, as described.   UA w/ trace ketones, large LE, 50-100 WBC, no culture sent.      Pertinent PMH;  06/20/21 pt was seen in the ER after found down;  CT head: No acute intracranial abnormality.  BRAIN AND POSTERIOR FOSSA: Atrophy. Patchy periventricular, deep and subcortical   white matter hypoattenuation which is nonspecific but likely represents chronic   ischemic changes.  No evidence of acute large vessel transcortical infarct or   acute parenchymal hemorrhage. No midline shift or hydrocephalus.     CXR: Bilateral lower lobe parenchymal opacities, may reflect aspiration or pneumonia.  EKG w/ 2nd degree AVB, prolonged QRS.  CBC stable- WBC 7.8, hgb 11, platelets 74 (from 195)  BMP stable- Na 133 (from 137), BUN 34,  Cr 1.91, k+ 4.2  CXR: Bilateral lower lobe parenchymal opacities, may reflect aspiration or pneumonia.    Hx;  Pt lives at the [facility], daughter Matthew Chandler is ~1 hour away and active in his care planning.        Past Medical History:   Diagnosis Date    Anemia, unspecified     Aortic valve replaced     Atrial fibrillation ([place])     Bacteremia 12/2015    MSSA    Benign non-nodular prostatic hyperplasia with lower urinary tract symptoms     Benign prostatic hyperplasia with lower urinary tract symptoms     Chronic diastolic heart failure (HCC)     Chronic idiopathic constipation     Generalized edema     GERD without esophagitis     Gross hematuria     History of aortic valve replacement with bioprosthetic valve     History of depression     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)     Hypertension, essential 11/12/2017    Hypokalemia     Hyponatremia with excess extracellular fluid volume     Long-term use of high-risk medication     Muscle cramps     Muscular deconditioning     Reactive depression     Spinal stenosis of lumbar region     Typical atrial flutter (HCC)     Unspecified osteoarthritis, unspecified site 03/03/2019    Urinary retention     Urinary urgency      Past Surgical History:   Procedure Laterality Date    HX APPENDECTOMY      HX CATARACT REMOVAL      HX CORONARY ARTERY BYPASS GRAFT  05/2013    HX OTHER SURGICAL      NECK SURGERY    HX TONSILLECTOMY      HX UROLOGICAL  05/14/2016    [place] - Cystoscopy and UroLift (transprostatic implants #10), Dr Sampson Si     History reviewed. No pertinent family history.    Social History     Socioeconomic History    Marital status: WIDOWED     Spouse name: Not on file    Number of children: Not on file    Years of education: Not on file    Highest education level: Not on file   Occupational History    Not on file   Tobacco Use    Smoking status: Never  Smokeless tobacco: Never   Substance and Sexual Activity    Alcohol use: Never     Drug use: Never    Sexual activity: Not on file   Other Topics Concern    Not on file   Social History Narrative    Not on file     Social Determinants of Health     Financial Resource Strain: Not on file   Food Insecurity: Not on file   Transportation Needs: Not on file   Physical Activity: Not on file   Stress: Not on file   Social Connections: Not on file   Intimate Partner Violence: Not on file   Housing Stability: Not on file     Social History     Tobacco Use   Smoking Status Never   Smokeless Tobacco Never       Current Outpatient Medications   Medication Sig Dispense Refill    cycloSPORINE (Restasis) 0.05 % dpet Administer 1 Drop to both eyes every twelve (12) hours.      amoxicillin-clavulanate (AUGMENTIN) 500-125 mg per tablet Take 1 Tablet by mouth two (2) times a day for 10 days. Take a "probiotic" of your choice while on this antibiotic. 20 Tablet 0    benzonatate (TESSALON) 100 mg capsule Take 1 Capsule by mouth two (2) times daily as needed for Cough for up to 7 days. 15 Capsule 0    loratadine (CLARITIN) 10 mg tablet Take 10 mg by mouth daily.      finasteride (PROSCAR) 5 mg tablet Take 1 Tablet by mouth daily. 90 Tablet 3    mirabegron ER (Myrbetriq) 50 mg ER tablet Take 1 Tablet by mouth daily. 90 Tablet 3    silodosin (RAPAFLO) 8 mg capsule Take 1 Capsule by mouth daily (with dinner). 90 Capsule 3    nitroglycerin (NITROSTAT) 0.4 mg SL tablet 0.4 mg by SubLINGual route every five (5) minutes as needed. Up to 3 doses.      acetaminophen (TYLENOL) 500 mg tablet Take  by mouth every eight (8) hours as needed.      azelastine (ASTELIN) 137 mcg (0.1 %) nasal spray 1 Spray two (2) times a day. Use in each nostril as directed      ammonium lactate (AMLACTIN) 12 % topical cream Apply  to affected area two (2) times a day. rub in to affected area well      apixaban (ELIQUIS) 2.5 mg tablet 2.5 mg.      citalopram (CELEXA) 10 mg tablet TK 1 T PO QD IN THE MORNING  11    folic acid (FOLVITE) 1 mg tablet TK 1  T PO QD  3    furosemide (LASIX) 40 mg tablet TAKE ONE-HALF TABLET PO PRF FLUID OR EDEMA  9    omeprazole (PRILOSEC) 20 mg capsule Take 20 mg by mouth daily.      hydrocortisone (CORTIZONE) 0.5 % ointment Apply  to affected area two (2) times a day. use thin layer      VIT A/VIT C/VIT E/ZINC/COPPER (PRESERVISION AREDS PO) Take  by mouth.      magnesium oxide (MAG-OX) 400 mg tablet Take 400 mg by mouth daily.       Allergies   Allergen Reactions    Statins-Hmg-Coa Reductase Inhibitors Unknown (comments)       BP (!) 121/48 (BP 1 Location: Left upper arm, BP Patient Position: Sitting, BP Cuff Size: Adult)    Pulse 60    Temp 98.6 ??F (37 ??  C) (Temporal)    Resp 18    SpO2 91%     Physical Exam  Constitutional:       General: He is not in acute distress.     Appearance: He is not toxic-appearing.      Comments: Frail, thin appearing elderly gentleman.  Fatigued appearing.   Hard of hearing.   Cardiovascular:      Rate and Rhythm: Normal rate and regular rhythm.      Heart sounds: Normal heart sounds.   Pulmonary:      Effort: No respiratory distress.      Comments: Mild Left basilar rales.  Abdominal:      General: Bowel sounds are normal.      Palpations: Abdomen is soft.      Tenderness: There is no abdominal tenderness.   Musculoskeletal:      Right lower leg: No edema.      Left lower leg: No edema.      Comments: Mild-moderate diffuse muscle atrophy.   Skin:     General: Skin is warm and dry.   Neurological:      Mental Status: He is alert.       Lab Results   Component Value Date/Time    WBC 7.8 06/20/2021 10:05 AM    HGB 11.0 (L) 06/20/2021 10:05 AM    HCT 32.4 (L) 06/20/2021 10:05 AM    PLATELET 74 (L) 06/20/2021 10:05 AM    MCV 95.3 06/20/2021 10:05 AM     Lab Results   Component Value Date/Time    Sodium 133 (L) 06/20/2021 10:05 AM    Potassium 4.2 06/20/2021 10:05 AM    Chloride 97 (L) 06/20/2021 10:05 AM    CO2 30 06/20/2021 10:05 AM    Anion gap 6 06/20/2021 10:05 AM    Glucose 93 06/20/2021 10:05 AM    BUN  34 (H) 06/20/2021 10:05 AM    Creatinine 1.91 (H) 06/20/2021 10:05 AM    BUN/Creatinine ratio 18 06/20/2021 10:05 AM    GFR est AA 40 (L) 06/20/2021 10:05 AM    GFR est non-AA 33 (L) 06/20/2021 10:05 AM    Calcium 8.8 06/20/2021 10:05 AM    Bilirubin, total 0.5 06/20/2021 10:05 AM    Alk. phosphatase 83 06/20/2021 10:05 AM    Protein, total 6.8 06/20/2021 10:05 AM    Albumin 3.2 (L) 06/20/2021 10:05 AM    Globulin 3.6 06/20/2021 10:05 AM    A-G Ratio 0.9 06/20/2021 10:05 AM    ALT (SGPT) 17 06/20/2021 10:05 AM    AST (SGOT) 16 06/20/2021 10:05 AM     No results found for: CHOL, CHOLPOCT, CHOLX, CHLST, CHOLV, TOTCHOLEXT, HDL, HDLPOC, HDLEXT, HDLP, LDL, LDLCPOC, LDLCEXT, LDLC, DLDLP, VLDLC, VLDL, TGLX, TRIGL, TRIGLYCEXT, TRIGP, TGLPOCT, CHHD, CHHDX    On this date 09/26/21 I have spent >45 minutes reviewing previous notes, test results and face to face with the patient for interview/exam, discussing working diagnosis and treatment plan as well as same day documentation and paperwork for [employer]- see scanned copy.    Medical decision making complexity: moderate-high.    Enid Cutter, MD   Family & Geriatric Medicine

## 2021-09-26 NOTE — Progress Notes (Signed)
 Nurse states patient had UTI about 3 weeks ago. Went to ER due to a fall and urine showed UTI at ER. States he is feeling weak, lethargic, cough at night , lungs sound like they are rattling for nurse that sat with him at night. Would also like an order for a transfer chair for patient.  WOULD LIKE AN ORDER FOR BENZONATATE.  Cardiologist on yesterday and patient in on heart monitor for 1 week.      Matthew Chandler presents today for   Chief Complaint   Patient presents with    Lethargy     ? UTI       Is someone accompanying this pt? Yes, [facility] staff    Is the patient using any DME equipment during OV? Yes, wheelchair    Depression Screening:  3 most recent PHQ Screens 09/26/2021   PHQ Not Done -   Little interest or pleasure in doing things Not at all   Feeling down, depressed, irritable, or hopeless Not at all   Total Score PHQ 2 0       Learning Assessment:  Learning Assessment 06/25/2019   PRIMARY LEARNER Patient   HIGHEST LEVEL OF EDUCATION - PRIMARY LEARNER  4 YEARS OF COLLEGE   BARRIERS PRIMARY LEARNER NONE   CO-LEARNER CAREGIVER No   PRIMARY LANGUAGE ENGLISH   LEARNER PREFERENCE PRIMARY READING   ANSWERED BY patient   RELATIONSHIP SELF       Fall Risk  Fall Risk Assessment, last 12 mths 09/26/2021   Able to walk? Yes   Fall in past 12 months? 1   Do you feel unsteady? 1   Are you worried about falling 1   Is TUG test greater than 12 seconds? 1   Is the gait abnormal? 1   Number of falls in past 12 months 2   Fall with injury? 0       ADL  ADL Assessment 09/26/2021   Feeding yourself Help Needed   Getting from bed to chair Help Needed   Getting dressed Help Needed   Bathing or showering Help Needed   Walk across the room (includes cane/walker) Help Needed   Using the telphone Help Needed   Taking your medications Help Needed   Preparing meals Help Needed   Managing money (expenses/bills) Help Needed   Moderately strenuous housework (laundry) Help Needed   Shopping for personal items (toiletries/medicines)  Help Needed   Shopping for groceries Help Needed   Driving Help Needed   Climbing a flight of stairs Help Needed   Getting to places beyond walking distances Help Needed       Health Maintenance reviewed and discussed and ordered per Provider.    Health Maintenance Due   Topic Date Due    Pneumococcal 65+ years (1 - PCV) Never done    DTaP/Tdap/Td series (1 - Tdap) Never done    Shingrix Vaccine Age 52> (1 of 2) Never done    Medicare Yearly Exam  Never done    COVID-19 Vaccine (5 - Booster for [employer] series) 05/16/2021   .      Coordination of Care:  1. Have you been to the ER, urgent care clinic since your last visit?  Hospitalized since your last visit? Yes Where: [facility] for fall    2. Have you seen or consulted any other health care providers outside of the [facility] since your last visit? Cardiologist Dr. Bernetta in [place].    3.  For patients aged 62-75: Has the patient had a colonoscopy? NA - based on age     If the patient is male:    4. For patients aged 51-74: Has the patient had a mammogram within the past 2 years? NA - based on age    5. For patients aged 21-65: Has the patient had a pap smear? NA - based on age

## 2021-09-27 LAB — IRON AND TIBC
Iron Saturation: 8 % — ABNORMAL LOW (ref 20–50)
Iron: 19 ug/dL — ABNORMAL LOW (ref 50–175)
TIBC: 234 ug/dL — ABNORMAL LOW (ref 250–450)

## 2021-09-27 LAB — FERRITIN
Ferritin: 517 ng/mL — ABNORMAL HIGH (ref 8–388)
Ferritin: 517 ng/mL — ABNORMAL HIGH (ref 8–388)

## 2021-09-27 LAB — VITAMIN B12
Vitamin B-12: 769 pg/mL (ref 211–911)
Vitamin B12: 769 pg/mL (ref 211–911)

## 2021-09-27 LAB — IRON PROFILE
Iron % saturation: 8 % — ABNORMAL LOW (ref 20–50)
Iron: 19 ug/dL — ABNORMAL LOW (ref 50–175)
TIBC: 234 ug/dL — ABNORMAL LOW (ref 250–450)

## 2021-09-28 LAB — CULTURE, URINE
Colonies Counted: 50000
Colony Count: 50000

## 2021-09-29 LAB — VITAMIN B1, WHOLE BLOOD
Vitamin B1,Whole Blood: 110.9 nmol/L (ref 66.5–200.0)
Vitamin B1: 110.9 nmol/L (ref 66.5–200.0)

## 2021-10-04 ENCOUNTER — Encounter: Payer: MEDICARE | Attending: Family Medicine | Primary: Family Medicine

## 2021-10-11 ENCOUNTER — Ambulatory Visit: Admit: 2021-10-11 | Discharge: 2021-10-11 | Payer: MEDICARE | Attending: Family Medicine | Primary: Family Medicine

## 2021-10-11 DIAGNOSIS — R051 Acute cough: Secondary | ICD-10-CM

## 2021-10-11 LAB — AMB POC URINALYSIS DIP STICK AUTO W/O MICRO
Bilirubin (UA POC): NEGATIVE
Bilirubin, Urine, POC: NEGATIVE
Blood (UA POC): NEGATIVE
Blood (UA POC): NEGATIVE
Glucose (UA POC): NEGATIVE
Glucose, Urine, POC: NEGATIVE
Ketones (UA POC): NEGATIVE
Ketones, Urine, POC: NEGATIVE
Leukocyte Esterase, Urine, POC: NEGATIVE
Leukocyte esterase (UA POC): NEGATIVE
Nitrite, Urine, POC: NEGATIVE
Nitrites (UA POC): NEGATIVE
Protein (UA POC): NEGATIVE
Protein, Urine, POC: NEGATIVE
Specific Gravity, Urine, POC: 1.025 NA (ref 1.001–1.035)
Specific gravity (UA POC): 1.025 (ref 1.001–1.035)
Urobilinogen (UA POC): 0.2 (ref 0.2–1)
Urobilinogen, POC: 0.2 (ref 0.2–1)
pH (UA POC): 6 (ref 4.6–8.0)
pH, Urine, POC: 6 NA (ref 4.6–8.0)

## 2021-10-11 NOTE — Progress Notes (Signed)
Matthew Chandler (DOB: 03/27/1924) is a 87 y.o. male here for evaluation of the following chief concern(s):  ER follow-up for acute and chronic condition management    ASSESSMENT/PLAN:  1. Acute cough  -     XR CHEST PA LAT; Future  -     amoxicillin-clavulanate (AUGMENTIN) 500-125 mg per tablet; Take 1 Tablet by mouth two (2) times a day for 10 days., Normal, Disp-20 Tablet, R-0  2. Dysphagia, unspecified type  -     REFERRAL TO PHYSICAL THERAPY  3. Iron deficiency anemia, unspecified iron deficiency anemia type  -     OCCULT BLOOD IMMUNOASSAY,DIAGNOSTIC  4. Urinary frequency  -     AMB POC URINALYSIS DIP STICK AUTO W/O MICRO  -     CULTURE, URINE  -     amoxicillin-clavulanate (AUGMENTIN) 500-125 mg per tablet; Take 1 Tablet by mouth two (2) times a day for 10 days., Normal, Disp-20 Tablet, R-0  5. Frailty syndrome in geriatric patient    Matthew Chandler appears medically stable at this time, afebrile and normal hemodynamics, no hypoxia or focal lung findings on exam.      Will treat empirically for aspiration pneumonia with recurrent cough.  Daughter describes possible dysphagia/clinical aspiration during meals.  Refer for speech therapy services.  Update CXR, close monitoring and ER precautions for any worsening symptoms were reviewed- see AVS.      Return in about 4 weeks (around 11/08/2021) for follow-up w/ NP Kerrick.    Matthew Chandler agrees with plan as above and has no additional questions at this time.       SUBJECTIVE/OBJECTIVE:  Matthew Chandler, 87 y.o. male CAD s/p CABG (8/14), chronic diastolic heart failure, aortic valve replacement bioprosthetic, atrial flutter on Eliquis, spinal stenosis, HTN, CKD, hyponatremia, urinary retention, depression, recent COVID s/p Paxlovid.      09/26/21 results reviewed w/ pt;  CBC- WBC 10.4, hgb 9 (from 11 on 06/20/21)  Iron profile; iron % and TIBC low  Ferritin elevated (517)  BMP- Na 134, Cr 1.42 (from 1.91), k+ 4.4  Mag WNL  Phos WNL  TSH WNL  B1 WNL  B12 WNL  UA nitrite negative,  0-4 WBC  Ucx mixed flora <50k    Nov 2022 CrCl ~66m/min    Last visit 09/26/21, pt prescribed empiric Augmentin for possible UTI/at risk for aspiration w/ acute cough, PT and DME for impaired mobility.    Pt presents today to the office w/ his daughter who provided the majority of HPI 2/2 cognitive impairment.    Pt now has a sitter 24-7 onset right before Thanksgiving related to fall risk.  [facility] is now distributing [facility] medications.    [facility]      Cough:  Onset Saturday, pt has been treated w/ Tessalon for a couple of doses.  No troubles w/ his breathing.  No fevers or chills.  No changes to mental status in the past few days or weeks.    Pt coughs some w/ taking PO.  Pt has been seen by ENT- Dr. PPearletha Forgerecently.  He has not seen a SLT recently.    Pt follows w/ Dr. SRichrd Humblesfor dental care; pt can effectively brush his teeth w/ assistance twice daily.   He sleep on a regular bed.  He takes normal consistency diet- low salt.      UTI symptoms:  Per [name]  increased urinary frequency at night.    Per daughter, unsure if there has been a change in urinary.    Pt treated for UTI in Nov 2022 x2, and w/ Urology in Oct 2022.  Hx;  Pt seen in the ER on 09/08/21 s/p fall, dx UTI and prescribed Nitrofurantoin x7 days.  CT head:  1.  No CT evidence of an acute intracranial abnormality or other findings   related to recent trauma.     2.  Moderate cerebral atrophy/volume loss and periventricular white matter   changes, most commonly seen with chronic microvascular disease.   CT spine:   1.  No acute fracture or subluxation of the cervical spine.   2.  Multilevel degenerative changes, as described.   UA w/ trace ketones, large LE, 50-100 WBC, no culture sent.    No visible blood loss; none seen in nose/mouth, hematuria, BRBPR, melena.        Pertinent PMH;  06/20/21 pt was seen in the ER after found down;  CT head: No acute intracranial  abnormality.  BRAIN AND POSTERIOR FOSSA: Atrophy. Patchy periventricular, deep and subcortical   white matter hypoattenuation which is nonspecific but likely represents chronic   ischemic changes.  No evidence of acute large vessel transcortical infarct or   acute parenchymal hemorrhage. No midline shift or hydrocephalus.     CXR: Bilateral lower lobe parenchymal opacities, may reflect aspiration or pneumonia.  EKG w/ 2nd degree AVB, prolonged QRS.  CBC stable- WBC 7.8, hgb 11, platelets 74 (from 195)  BMP stable- Na 133 (from 137), BUN 34, Cr 1.91, k+ 4.2  CXR: Bilateral lower lobe parenchymal opacities, may reflect aspiration or pneumonia.    Hx;  Pt lives at the [facility], daughter Matthew Chandler is ~1 hour away and active in his care planning.      07/31/21 PVR bladder scan: 0  07/10/21 UA WNL    Past Medical History:   Diagnosis Date    Anemia, unspecified     Aortic valve replaced     Atrial fibrillation ([place])     Bacteremia 12/2015    MSSA    Benign non-nodular prostatic hyperplasia with lower urinary tract symptoms     Benign prostatic hyperplasia with lower urinary tract symptoms     Chronic diastolic heart failure (HCC)     Chronic idiopathic constipation     Generalized edema     GERD without esophagitis     Gross hematuria     History of aortic valve replacement with bioprosthetic valve     History of depression     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)     Hypertension, essential 11/12/2017    Hypokalemia     Hyponatremia with excess extracellular fluid volume     Long-term use of high-risk medication     Muscle cramps     Muscular deconditioning     Reactive depression     Spinal stenosis of lumbar region     Typical atrial flutter (HCC)     Unspecified osteoarthritis, unspecified site 03/03/2019    Urinary retention     Urinary urgency      Past Surgical History:   Procedure Laterality Date    HX APPENDECTOMY      HX CATARACT REMOVAL      HX CORONARY ARTERY BYPASS GRAFT  05/2013     HX OTHER SURGICAL      NECK SURGERY    HX TONSILLECTOMY  HX UROLOGICAL  05/14/2016    [place] - Cystoscopy and UroLift (transprostatic implants #10), Dr Sampson Si     History reviewed. No pertinent family history.    Social History     Socioeconomic History    Marital status: WIDOWED     Spouse name: Not on file    Number of children: Not on file    Years of education: Not on file    Highest education level: Not on file   Occupational History    Not on file   Tobacco Use    Smoking status: Never    Smokeless tobacco: Never   Substance and Sexual Activity    Alcohol use: Never    Drug use: Never    Sexual activity: Not on file   Other Topics Concern    Not on file   Social History Narrative    Not on file     Social Determinants of Health     Financial Resource Strain: Not on file   Food Insecurity: Not on file   Transportation Needs: Not on file   Physical Activity: Not on file   Stress: Not on file   Social Connections: Not on file   Intimate Partner Violence: Not on file   Housing Stability: Not on file     Social History     Tobacco Use   Smoking Status Never   Smokeless Tobacco Never       Current Outpatient Medications   Medication Sig Dispense Refill    amoxicillin-clavulanate (AUGMENTIN) 500-125 mg per tablet Take 1 Tablet by mouth two (2) times a day for 10 days. 20 Tablet 0    cycloSPORINE (RESTASIS) 0.05 % dpet Administer 1 Drop to both eyes every twelve (12) hours.      loratadine (CLARITIN) 10 mg tablet Take 10 mg by mouth daily.      finasteride (PROSCAR) 5 mg tablet Take 1 Tablet by mouth daily. 90 Tablet 3    mirabegron ER (Myrbetriq) 50 mg ER tablet Take 1 Tablet by mouth daily. 90 Tablet 3    silodosin (RAPAFLO) 8 mg capsule Take 1 Capsule by mouth daily (with dinner). 90 Capsule 3    nitroglycerin (NITROSTAT) 0.4 mg SL tablet 0.4 mg by SubLINGual route every five (5) minutes as needed. Up to 3 doses.      acetaminophen (TYLENOL) 500 mg tablet Take  by mouth every eight (8) hours as needed.       azelastine (ASTELIN) 137 mcg (0.1 %) nasal spray 1 Spray two (2) times a day. Use in each nostril as directed      ammonium lactate (AMLACTIN) 12 % topical cream Apply  to affected area two (2) times a day. rub in to affected area well      apixaban (ELIQUIS) 2.5 mg tablet 2.5 mg.      citalopram (CELEXA) 10 mg tablet TK 1 T PO QD IN THE MORNING  11    folic acid (FOLVITE) 1 mg tablet TK 1 T PO QD  3    furosemide (LASIX) 40 mg tablet TAKE ONE-HALF TABLET PO PRF FLUID OR EDEMA  9    omeprazole (PRILOSEC) 20 mg capsule Take 20 mg by mouth daily.      hydrocortisone (CORTIZONE) 0.5 % ointment Apply  to affected area two (2) times a day. use thin layer      VIT A/VIT C/VIT E/ZINC/COPPER (PRESERVISION AREDS PO) Take  by mouth.      magnesium oxide (  MAG-OX) 400 mg tablet Take 400 mg by mouth daily.      potassium chloride (KLOR-CON M10) 10 mEq tablet Take 1 Tablet by mouth daily. While on increased dose Furosemide. 7 Tablet 0    furosemide (LASIX) 20 mg tablet Take one tablet daily (instead of every other day) for 1 week starting 10/16/21, then go back to dosing every other day. 7 Tablet 0     Allergies   Allergen Reactions    Statins-Hmg-Coa Reductase Inhibitors Unknown (comments)       BP 126/61 (BP 1 Location: Right upper arm, BP Patient Position: Sitting, BP Cuff Size: Adult)    Pulse 67    Temp 97.9 ??F (36.6 ??C) (Temporal)    Resp 20    Ht '5\' 8"'  (1.727 m)    SpO2 95%    BMI 20.98 kg/m??     Physical Exam  Constitutional:       General: He is not in acute distress.     Appearance: He is not ill-appearing or toxic-appearing.      Comments: Frail, thin appearing elderly gentleman.  Fatigued appearing.   Hard of hearing.   Cardiovascular:      Rate and Rhythm: Normal rate and regular rhythm.      Heart sounds: Normal heart sounds.   Pulmonary:      Effort: No respiratory distress.      Breath sounds: Normal breath sounds.   Abdominal:      General: Bowel sounds are normal.      Palpations: Abdomen is soft.       Tenderness: There is no abdominal tenderness.   Musculoskeletal:      Right lower leg: No edema.      Left lower leg: No edema.      Comments: Mild-moderate diffuse muscle atrophy.   Skin:     General: Skin is warm and dry.   Neurological:      Mental Status: He is alert.       Lab Results   Component Value Date/Time    WBC 10.4 09/26/2021 12:11 PM    HGB 9.0 (L) 09/26/2021 12:11 PM    HCT 26.4 (L) 09/26/2021 12:11 PM    PLATELET 306 09/26/2021 12:11 PM    MCV 97.1 09/26/2021 12:11 PM     Lab Results   Component Value Date/Time    Sodium 134 (L) 09/26/2021 12:11 PM    Potassium 4.4 09/26/2021 12:11 PM    Chloride 102 09/26/2021 12:11 PM    CO2 24 09/26/2021 12:11 PM    Anion gap 8 09/26/2021 12:11 PM    Glucose 88 09/26/2021 12:11 PM    BUN 35 (H) 09/26/2021 12:11 PM    Creatinine 1.42 (H) 09/26/2021 12:11 PM    BUN/Creatinine ratio 25 (H) 09/26/2021 12:11 PM    GFR est AA 40 (L) 06/20/2021 10:05 AM    GFR est non-AA 33 (L) 06/20/2021 10:05 AM    Calcium 8.4 (L) 09/26/2021 12:11 PM    Bilirubin, total 0.5 06/20/2021 10:05 AM    Alk. phosphatase 83 06/20/2021 10:05 AM    Protein, total 6.8 06/20/2021 10:05 AM    Albumin 3.2 (L) 06/20/2021 10:05 AM    Globulin 3.6 06/20/2021 10:05 AM    A-G Ratio 0.9 06/20/2021 10:05 AM    ALT (SGPT) 17 06/20/2021 10:05 AM    AST (SGOT) 16 06/20/2021 10:05 AM     No results found for: CHOL, CHOLPOCT, CHOLX, CHLST, CHOLV, TOTCHOLEXT, HDL, HDLPOC,  HDLEXT, HDLP, LDL, LDLCPOC, LDLCEXT, LDLC, DLDLP, VLDLC, VLDL, TGLX, TRIGL, TRIGLYCEXT, TRIGP, TGLPOCT, CHHD, CHHDX    On this date 10/11/21 I have spent >30 minutes reviewing previous notes, test results and face to face with the patient for interview/exam, discussing working diagnosis and treatment plan as well as same day documentation and paperwork for [employer]- see scanned copy.    Medical decision making complexity: moderate-high.    Enid Cutter, MD   Family & Geriatric Medicine

## 2021-10-11 NOTE — Progress Notes (Signed)
 Patient daughter states he started with a cough and congestion Saturday night and he has been taking tessalon pearls. Patient has been having increased urination at night per the [facility] staff.      Matthew Chandler presents today for   Chief Complaint   Patient presents with    Follow Up Chronic Condition       Is someone accompanying this pt?yes, daughter    Is the patient using any DME equipment during OV? Yes, rollator    Depression Screening:  3 most recent PHQ Screens 10/11/2021   PHQ Not Done -   Little interest or pleasure in doing things Not at all   Feeling down, depressed, irritable, or hopeless Not at all   Total Score PHQ 2 0       Learning Assessment:  Learning Assessment 06/25/2019   PRIMARY LEARNER Patient   HIGHEST LEVEL OF EDUCATION - PRIMARY LEARNER  4 YEARS OF COLLEGE   BARRIERS PRIMARY LEARNER NONE   CO-LEARNER CAREGIVER No   PRIMARY LANGUAGE ENGLISH   LEARNER PREFERENCE PRIMARY READING   ANSWERED BY patient   RELATIONSHIP SELF       Fall Risk  Fall Risk Assessment, last 12 mths 10/11/2021   Able to walk? Yes   Fall in past 12 months? 1   Do you feel unsteady? 1   Are you worried about falling 1   Is TUG test greater than 12 seconds? 1   Is the gait abnormal? 1   Number of falls in past 12 months 2   Fall with injury? 0       ADL  ADL Assessment 10/11/2021   Feeding yourself No Help Needed   Getting from bed to chair Help Needed   Getting dressed Help Needed   Bathing or showering Help Needed   Walk across the room (includes cane/walker) Help Needed   Using the telphone Help Needed   Taking your medications Help Needed   Preparing meals Help Needed   Managing money (expenses/bills) Help Needed   Moderately strenuous housework (laundry) Help Needed   Shopping for personal items (toiletries/medicines) Help Needed   Shopping for groceries Help Needed   Driving Help Needed   Climbing a flight of stairs Help Needed   Getting to places beyond walking distances Help Needed       Health Maintenance reviewed  and discussed and ordered per Provider.    Health Maintenance Due   Topic Date Due    Pneumococcal 85+ years (1 - PCV) Never done    DTaP/Tdap/Td series (1 - Tdap) Never done    Shingrix Vaccine Age 85> (1 of 2) Never done    Medicare Yearly Exam  Never done    COVID-19 Vaccine (5 - Booster for [employer] series) 05/16/2021   .      Coordination of Care:  1. Have you been to the ER, urgent care clinic since your last visit?  Hospitalized since your last visit? No    2. Have you seen or consulted any other health care providers outside of the [facility] since your last visit? No     3. For patients aged 85-75: Has the patient had a colonoscopy? NA - based on age

## 2021-10-12 ENCOUNTER — Inpatient Hospital Stay: Admit: 2021-10-12 | Payer: MEDICARE | Primary: Family Medicine

## 2021-10-12 DIAGNOSIS — R350 Frequency of micturition: Secondary | ICD-10-CM

## 2021-10-13 LAB — CULTURE, URINE
Colonies Counted: 30000
Colony Count: 30000

## 2021-10-15 ENCOUNTER — Inpatient Hospital Stay: Admit: 2021-10-15 | Payer: MEDICARE | Attending: Family Medicine | Primary: Family Medicine

## 2021-10-15 DIAGNOSIS — R9389 Abnormal findings on diagnostic imaging of other specified body structures: Secondary | ICD-10-CM

## 2021-10-15 DIAGNOSIS — J849 Interstitial pulmonary disease, unspecified: Secondary | ICD-10-CM

## 2021-10-16 ENCOUNTER — Telehealth

## 2021-10-16 MED ORDER — AMOXICILLIN-CLAVULANATE 500 MG-125 MG TAB
500-125 mg | ORAL_TABLET | Freq: Two times a day (BID) | ORAL | 0 refills | Status: AC
Start: 2021-10-16 — End: 2021-10-26

## 2021-10-16 MED ORDER — DEXTROMETHORPHAN POLY COMPLEX SR 30 MG/5 ML ORAL 12 HR SUSP
30 mg/5 mL | Freq: Two times a day (BID) | ORAL | 0 refills | Status: AC | PRN
Start: 2021-10-16 — End: 2021-10-26

## 2021-10-16 MED ORDER — POTASSIUM CHLORIDE SR 10 MEQ TAB, PARTICLES/CRYSTALS
10 mEq | ORAL_TABLET | Freq: Every day | ORAL | 0 refills | Status: AC
Start: 2021-10-16 — End: 2021-11-06

## 2021-10-16 MED ORDER — FUROSEMIDE 20 MG TAB
20 mg | ORAL_TABLET | ORAL | 0 refills | Status: AC
Start: 2021-10-16 — End: ?

## 2021-10-16 NOTE — Telephone Encounter (Signed)
Called and spoke to daughter Nedra Hai and she states he has an appointment with cardiology on 10/23/2021, he does not have a Pulmonologist. Daughter states she will discuss need for pulmonology with cardiology as well. Called [employer] and spoke with Elita Quick and will fax over written out instructions. Prescriptions sent to pharmacy by PCP and lab order for 10/22/2021 entered as well. Will notify PSR to schedule patient.

## 2021-10-16 NOTE — Telephone Encounter (Signed)
Misty Stanley from the [facility] called to let [place] know that resident had the chest x ray done yesterday and when results are back to please call her. She also wrote an order for Delsym cough syrup, the daughter brought in, she said the tessalon perles didn't seem to be working, so that order will need to be signed if ok.

## 2021-10-16 NOTE — Telephone Encounter (Signed)
Re-entered correct order for speech therapy.

## 2021-10-16 NOTE — Telephone Encounter (Signed)
CXR reviewed- layer pulm effusions w/ opacities.  Pt is taking Augmentin.  Hx of CAD and heart failure.  09/26/21 Na 134, k+ 4.4, BUN 35, Cr 1.42 (baseline ~1.9).  Pt already takes Furosemide 20mg  every other day based on most recent orders- see scanned copy.      Nursing to call daughter/mPOA and update on finding- pneumonia vs CHF flare seen on chest Xray.  Continue antibiotics with close monitoring, strict ER precautions for any changes in his status.  Medication adjustments recommended.  Pt may benefit from follow-up w/ Cardiology, and/or referral to Pulmonology.    Nursing to also update [facility], fax orders if needed; Increase Furosemide 20mg  every other day to Furosemide 20mg  once daily x1 week starting today, and KCl daily x1 week starting today, check BMP/mag drawn at [facility] on 10/22/21.      Also, pt needs an acute visit w/ a partner (in-office or televisit) before 11/15/21.      [facility], MD

## 2021-10-16 NOTE — Telephone Encounter (Signed)
Noted, order is on PCP desk to sign and I will have PCP look at chest Xray results as well.

## 2021-10-23 ENCOUNTER — Encounter

## 2021-10-23 ENCOUNTER — Inpatient Hospital Stay: Admit: 2021-10-23 | Payer: MEDICARE | Primary: Family Medicine

## 2021-10-23 DIAGNOSIS — Z5181 Encounter for therapeutic drug level monitoring: Secondary | ICD-10-CM

## 2021-10-23 LAB — BASIC METABOLIC PANEL
Anion Gap: 6 mmol/L (ref 3.0–18.0)
BUN: 22 mg/dL — ABNORMAL HIGH (ref 7–18)
Bun/Cre Ratio: 15 (ref 12–20)
CO2: 30 mmol/L (ref 21–32)
Calcium: 8.5 mg/dL (ref 8.5–10.1)
Chloride: 95 mmol/L — ABNORMAL LOW (ref 100–111)
Creatinine: 1.48 mg/dL — ABNORMAL HIGH (ref 0.60–1.30)
ESTIMATED GLOMERULAR FILTRATION RATE: 43 mL/min/{1.73_m2} — ABNORMAL LOW (ref >60)
Glucose: 89 mg/dL (ref 74–99)
Potassium: 4 mmol/L (ref 3.5–5.5)
Sodium: 131 mmol/L — ABNORMAL LOW (ref 136–145)

## 2021-10-23 LAB — MAGNESIUM
Magnesium: 2.2 mg/dL (ref 1.6–2.6)
Magnesium: 2.2 mg/dL (ref 1.6–2.6)

## 2021-10-23 LAB — METABOLIC PANEL, BASIC
Anion gap: 6 mmol/L (ref 3.0–18.0)
BUN/Creatinine ratio: 15 (ref 12–20)
BUN: 22 mg/dL — ABNORMAL HIGH (ref 7–18)
CO2: 30 mmol/L (ref 21–32)
Calcium: 8.5 mg/dL (ref 8.5–10.1)
Chloride: 95 mmol/L — ABNORMAL LOW (ref 100–111)
Creatinine: 1.48 mg/dL — ABNORMAL HIGH (ref 0.60–1.30)
Glucose: 89 mg/dL (ref 74–99)
Potassium: 4 mmol/L (ref 3.5–5.5)
Sodium: 131 mmol/L — ABNORMAL LOW (ref 136–145)
eGFR: 43 mL/min/{1.73_m2} — ABNORMAL LOW (ref >60)

## 2021-10-25 ENCOUNTER — Inpatient Hospital Stay: Admit: 2021-10-25 | Payer: MEDICARE | Primary: Family Medicine

## 2021-10-25 ENCOUNTER — Inpatient Hospital Stay: Admit: 2021-10-25 | Primary: Family Medicine

## 2021-10-25 ENCOUNTER — Encounter

## 2021-10-25 DIAGNOSIS — R059 Cough, unspecified: Secondary | ICD-10-CM

## 2021-10-26 ENCOUNTER — Inpatient Hospital Stay: Admit: 2021-10-26 | Payer: MEDICARE | Primary: Family Medicine

## 2021-10-26 DIAGNOSIS — R059 Cough, unspecified: Secondary | ICD-10-CM

## 2021-10-26 LAB — PROBNP, N-TERMINAL: BNP: 3068 pg/mL — ABNORMAL HIGH (ref 0–1800)

## 2021-10-26 LAB — NT-PRO BNP: NT pro-BNP: 3068 pg/mL — ABNORMAL HIGH (ref 0–1800)

## 2021-11-01 LAB — OCCULT BLOOD DIAGNOSTIC: OCCULT BLOOD, FECAL, IA: NEGATIVE

## 2021-11-01 LAB — OCCULT BLOOD IMMUNOASSAY,DIAGNOSTIC: Occult blood fecal, by IA: NEGATIVE

## 2021-11-02 ENCOUNTER — Ambulatory Visit: Admit: 2021-11-02 | Discharge: 2021-11-02 | Payer: MEDICARE | Attending: Family | Primary: Family Medicine

## 2021-11-02 DIAGNOSIS — I5032 Chronic diastolic (congestive) heart failure: Secondary | ICD-10-CM

## 2021-11-02 MED ORDER — SILODOSIN 8 MG CAPSULE
8 mg | ORAL_CAPSULE | Freq: Every day | ORAL | 3 refills | Status: AC
Start: 2021-11-02 — End: ?

## 2021-11-02 MED ORDER — OTHER
0 refills | Status: AC
Start: 2021-11-02 — End: ?

## 2021-11-02 NOTE — Progress Notes (Signed)
 Progress  Notes by [place], Lavonda at 11/02/21 1500                Author: [place], Lavonda  Service: --  Author Type: --       Filed: 11/02/21 1536  Encounter Date: 11/02/2021  Status: Signed          Editor: [place], Lavonda                  Delno J Churchill presents today for      Chief Complaint       Patient presents with        ?  Follow-up             2 week follow up and he needs an order for a wheelchair and a hospital bed            Is someone accompanying this pt? Yes Daughter       Is the patient using any DME equipment during OV? Yes rollator       Depression Screening:      3 most recent PHQ Screens  11/02/2021        PHQ Not Done  -     Little interest or pleasure in doing things  Several days     Feeling down, depressed, irritable, or hopeless  Several days        Total Score PHQ 2  2           Learning Assessment:      Learning Assessment  06/25/2019        PRIMARY LEARNER  Patient     HIGHEST LEVEL OF EDUCATION - PRIMARY LEARNER   4 YEARS OF COLLEGE     BARRIERS PRIMARY LEARNER  NONE     CO-LEARNER CAREGIVER  No     PRIMARY LANGUAGE  ENGLISH     LEARNER PREFERENCE PRIMARY  READING     ANSWERED BY  patient        RELATIONSHIP  SELF           Fall Risk      Fall Risk Assessment, last 12 mths  11/02/2021        Able to walk?  Yes     Fall in past 12 months?  1     Do you feel unsteady?  1     Are you worried about falling  1     Is TUG test greater than 12 seconds?  -     Is the gait abnormal?  -     Number of falls in past 12 months  1        Fall with injury?  0           ADL      ADL Assessment  10/11/2021        Feeding yourself  No Help Needed     Getting from bed to chair  Help Needed     Getting dressed  Help Needed     Bathing or showering  Help Needed     Walk across the room (includes cane/walker)  Help Needed     Using the telphone  Help Needed     Taking your medications  Help Needed     Preparing meals  Help Needed     Managing money (expenses/bills)  Help Needed     Moderately strenuous  housework (laundry)  Help Needed     Shopping for personal items (  toiletries/medicines)  Help Needed     Shopping for groceries  Help Needed     Driving  Help Needed     Climbing a flight of stairs  Help Needed        Getting to places beyond walking distances  Help Needed           Travel Screening:         Travel Screening               Question  Response           In the last 10 days, have you been in contact with someone who was confirmed or suspected to have Coronavirus/COVID-19?  No / Unsure       Have you had a COVID-19 viral test in the last 10 days?  No       Do you have any of the following new or worsening symptoms?  None of these           Have you traveled internationally or domestically in the last month?  No                     Travel History    Travel since 10/02/21      No documented travel since 10/02/21                Health Maintenance reviewed and discussed and ordered per Provider.        Health Maintenance Due        Topic  Date Due         ?  Pneumococcal 65+ years (1 - PCV)  Never done     ?  DTaP/Tdap/Td series (1 - Tdap)  Never done     ?  Shingles Vaccine (1 of 2)  Never done         ?  Medicare Yearly Exam   Never done     .        Coordination of Care:   1. Have you been to the ER, urgent care clinic since your last visit?  Hospitalized since your last visit? No      2. Have you seen or consulted any other health care providers outside of the [facility] since your last visit? Yes When: 09/2021 Where: [place]  Reason for visit: Cardiology         3. For patients aged 7-75: Has the patient had a colonoscopy? NA - based on age       If the patient is male:      4. For patients aged 53-74: Has the patient had a mammogram within the past 2 years? NA - based on age      22. For patients aged 21-65: Has the patient had a pap smear? NA - based on age

## 2021-11-02 NOTE — Telephone Encounter (Signed)
Returned call from patient's daughter Nedra Hai (HIPAA verified) - patient need refill for silodosin sent to the pharmacy.  Per last ov notes:  Continue Myrbetriq 50 mg daily, Proscar 5 mg daily, Rapaflo 8 mg daily    Refill sent  #90/R3

## 2021-11-02 NOTE — Progress Notes (Signed)
Matthew Chandler (DOB: 11-22-1923) is a 88 y.o. male patient, here for evaluation of the following chief complaint(s):  Follow-up (2 week follow up and he needs an order for a wheelchair and a hospital bed )       ASSESSMENT/PLAN:  Below is the assessment and plan developed based on review of pertinent history, physical exam, labs, studies, and medications.      The patient's daughter who was in the office with him and did discuss all concerns for her father, has a very appropriate expectation.  The patient is moving to another area but the daughter would like him to have a hospital bed due to his inability to ambulate and get out of bed on his own, and also wheelchair due to his ability to ambulate  Nurse will be placed per the patient's daughter he will be [facility] a [facility] and at this facility there is a doctor who sees patients once a week there    1. Chronic diastolic heart failure (HCC)  -     OTHER; Hospital bed, Print, Disp-1 Each, R-0  -     OTHER; Wheel chair, Print, Disp-1 Each, R-0  2. Orthostatic hypotension  -     OTHER; Hospital bed, Print, Disp-1 Each, R-0  -     OTHER; Wheel chair, Print, Disp-1 Each, R-0  3. Mild protein-calorie malnutrition (HCC)  -     OTHER; Hospital bed, Print, Disp-1 Each, R-0  -     OTHER; Wheel chair, Print, Disp-1 Each, R-0  4. Stage 3b chronic kidney disease (HCC)  -     OTHER; Hospital bed, Print, Disp-1 Each, R-0  -     OTHER; Wheel chair, Print, Disp-1 Each, R-0  5. Weakness generalized  -     OTHER; Hospital bed, Print, Disp-1 Each, R-0  -     OTHER; Wheel chair, Print, Disp-1 Each, R-0  No results found for this visit on 11/02/21.           I have discussed the diagnosis with the patient and the intended plan as seen in the above orders.  The patient has received an after-visit summary and questions were answered concerning future plans.  I have discussed medication side effects and warnings with the patient as well. I have reviewed the  plan of care with the patient, accepted their input and they are in agreement with the treatment goals. Previous lab and imaging results were reviewed by me.      I spent 25 minutes with this patient performing a medically appropriate exam, ordering tests and medications, counseling and educating, and documenting in the EMR     SUBJECTIVE/OBJECTIVE:    HPI: 88 year old male presents to the office his daughter, who is his primary caregiver, to discuss her concern that he needs a hospital bed ordered, and also a wheelchair.  Per the patient's daughter over the last several months the patient has had a decline in his physical status, he is now needing assistance with ambulation, she states it took 3 people to get him out of the bed today.  She also states he is not ambulating with his walker at all but most of the time needs to be using a wheelchair.  She states they have seen urology, neurology, cardiology with no definitive diagnosis except she states as possibility of declining function with age.  She states they saw cardiology December 29.  Also saw primary care provider on December  20, per daughter his cough has improved greatly and his symptoms have eased and he is back to close to baseline just mild cough   Per the patient's daughter, Misty StanleyLisa, she states he is walking less and less, they discontinued physical therapy as he will not participate.  Patient states she does speak with him and he will answer in short responses.  Also states he is eating and also feeds himself.he loves desserts .  States that the patient now has care 24/7 when she is not able to be with him.  The patient will be moving to a [place] called [facility] [place], this is where the daughter works and resides.  The patient does have hearing aids in, even with hearing aids we do have to speak with low tone and loud voice for him to make eye contact    ROS:  Constitutional: Negative for fever, chills ,fatigue, unexplained  weight gain/loss  HENT:  Negative for ear pain,postnasal drip, rhinitis, positive for difficulty hearing eyes:  Negative for visual disturbance,   Respiratory: For mild cough  Cardiovascular:  Negative for chest pain, palpitations and leg swelling.   Gastrointestinal:  Negative for abdominal pain, constipation, diarrhea, nausea ,vomiting.   Musculoskeletal:  Negative for arthralgias, back pain and gait problem. Joint pain, muscle pain  Skin:  Negative for rash, lesions,  Neurological:  Negative for fainting,dizziness, weakness, headaches,numbness positive for cognitive decline      Current Outpatient Medications   Medication Sig    silodosin (RAPAFLO) 8 mg capsule Take 1 Capsule by mouth daily (with dinner).    benzonatate (TESSALON) 100 mg capsule Take 100 mg by mouth three (3) times daily as needed for Cough.    OTHER Hospital bed    OTHER Wheel chair    furosemide (LASIX) 20 mg tablet Take one tablet daily (instead of every other day) for 1 week starting 10/16/21, then go back to dosing every other day.    cycloSPORINE (RESTASIS) 0.05 % dpet Administer 1 Drop to both eyes every twelve (12) hours.    loratadine (CLARITIN) 10 mg tablet Take 10 mg by mouth daily.    finasteride (PROSCAR) 5 mg tablet Take 1 Tablet by mouth daily.    mirabegron ER (Myrbetriq) 50 mg ER tablet Take 1 Tablet by mouth daily.    nitroglycerin (NITROSTAT) 0.4 mg SL tablet 0.4 mg by SubLINGual route every five (5) minutes as needed. Up to 3 doses.    azelastine (ASTELIN) 137 mcg (0.1 %) nasal spray 1 Spray two (2) times a day. Use in each nostril as directed    apixaban (ELIQUIS) 2.5 mg tablet 2.5 mg.    citalopram (CELEXA) 10 mg tablet TK 1 T PO QD IN THE MORNING    folic acid (FOLVITE) 1 mg tablet TK 1 T PO QD    omeprazole (PRILOSEC) 20 mg capsule Take 20 mg by mouth daily.    hydrocortisone (CORTIZONE) 0.5 % ointment Apply  to affected area two (2) times a day. use thin layer    VIT A/VIT C/VIT E/ZINC/COPPER (PRESERVISION AREDS PO) Take   by mouth.    magnesium oxide (MAG-OX) 400 mg tablet Take 400 mg by mouth daily.    potassium chloride (KLOR-CON M10) 10 mEq tablet Take 1 Tablet by mouth daily. While on increased dose Furosemide. (Patient not taking: Reported on 11/02/2021)    furosemide (LASIX) 40 mg tablet TAKE ONE-HALF TABLET PO PRF FLUID OR EDEMA (Patient not taking: Reported on 11/02/2021)  No current facility-administered medications for this visit.       BP 127/62    Pulse (!) 56    Temp 97.7 ??F (36.5 ??C)    Resp 15    SpO2 95%            General:  alert, cooperative, well appearing, in no apparent distress.  Eyes:  Pupils are equally round and reactive to light with accommodation.    CV:  The heart sounds are regular in rate and rhythm.  There is a normal S1 and S2.  There or no murmurs, rubs, or gallops.  Distal pulses are intact and equal.  Lungs:  Inspiratory and expiratory efforts are full and unlabored.  Lung sounds are clear and equal to auscultation throughout all lung fields without wheezing, rales, or rhonchi.  Extremities:  There is no clubbing, cyanosis, or edema.  3+ peripheral pulses.cap refill less than 2 seconds   Is in wheelchair, exam is limited due to patient's cooperation      An electronic signature was used to authenticate this note.  -- Drake Leach, FNP

## 2021-11-06 ENCOUNTER — Emergency Department: Admit: 2021-11-06 | Payer: MEDICARE | Primary: Family Medicine

## 2021-11-06 ENCOUNTER — Inpatient Hospital Stay
Admit: 2021-11-06 | Discharge: 2021-11-06 | Disposition: A | Discharge status: Home / Self Care | Payer: MEDICARE | Attending: Emergency Medicine

## 2021-11-06 DIAGNOSIS — S098XXA Other specified injuries of head, initial encounter: Secondary | ICD-10-CM

## 2021-11-06 DIAGNOSIS — S0990XA Unspecified injury of head, initial encounter: Secondary | ICD-10-CM

## 2021-11-06 MED ORDER — BACITRACIN 500 UNIT/G TOPICAL PACKET
500 unit/gram | CUTANEOUS | Status: AC
Start: 2021-11-06 — End: 2021-11-06
  Administered 2021-11-06: 12:00:00 via TOPICAL

## 2021-11-06 MED FILL — BACITRACIN 500 UNIT/G TOPICAL PACKET: 500 unit/gram | CUTANEOUS | Qty: 1

## 2021-11-06 NOTE — ED Provider Notes (Signed)
ED Provider Notes by Lysbeth Galas, MD at 11/06/21 [phone]                Author: Lysbeth Galas, MD  Service: Emergency Medicine  Author Type: Physician       Filed: 11/06/21 0750  Date of Service: 11/06/21 0623  Status: Signed          Editor: Lysbeth Galas, MD (Physician)               EMERGENCY DEPARTMENT HISTORY AND PHYSICAL EXAM           Date: 11/06/2021   Patient Name: Matthew Chandler        History of Presenting Illness          Chief Complaint       Patient presents with        ?  Fall           History Provided By: Patient      HPI: Matthew Chandler, 88 y.o. male with a past medical history significant  CHF, afib, AVR, BPH  presents to the ED with cc of fall from ground  level at assisted living witnessed by his sitter.  Patient tripped and fell, landed on the right side of his head and did not lose consciousness.  He does have a small abrasion to the right side of his head.  He is on Eliquis.  Has bruises all over his  body.  Denies headache, visual change, neck or back pain.  He is hard of hearing.  Daughter present at bedside to help give history.      There are no other complaints, changes, or physical findings at this time.      PCP: Quentin Angst, MD        No current facility-administered medications on file prior to encounter.          Current Outpatient Medications on File Prior to Encounter          Medication  Sig  Dispense  Refill           ?  ammonium lactate (AMLACTIN) 12 % topical cream  Apply  to affected area two (2) times a day. rub in to affected area well         ?  multivitamin-FA-lycopen-lutein (CertaVite Senior) 0.4 mg-300 mcg- 250 mcg tab tablet  Take 1 Tablet by mouth daily.         ?  propylene glycoL (Systane Complete) 0.6 % drop  Apply  to eye.         ?  acetaminophen (M-PAP PO)  Take 500 mg by mouth three (3) times daily as needed.         ?  silodosin (RAPAFLO) 8 mg capsule  Take 1 Capsule by mouth daily (with dinner).  90 Capsule  3     ?  benzonatate  (TESSALON) 100 mg capsule  Take 100 mg by mouth three (3) times daily as needed for Cough.               ?  furosemide (LASIX) 20 mg tablet  Take one tablet daily (instead of every other day) for 1 week starting 10/16/21, then go back to dosing every other day.  7 Tablet  0           ?  cycloSPORINE (RESTASIS) 0.05 % dpet  Administer 1 Drop to both eyes every twelve (12) hours.         ?  loratadine (CLARITIN) 10 mg tablet  Take 10 mg by mouth daily.         ?  finasteride (PROSCAR) 5 mg tablet  Take 1 Tablet by mouth daily.  90 Tablet  3     ?  mirabegron ER (Myrbetriq) 50 mg ER tablet  Take 1 Tablet by mouth daily.  90 Tablet  3     ?  nitroglycerin (NITROSTAT) 0.4 mg SL tablet  0.4 mg by SubLINGual route every five (5) minutes as needed. Up to 3 doses.         ?  azelastine (ASTELIN) 137 mcg (0.1 %) nasal spray  1 Spray two (2) times a day. Use in each nostril as directed         ?  apixaban (ELIQUIS) 2.5 mg tablet  2.5 mg.         ?  citalopram (CELEXA) 10 mg tablet  TK 1 T PO QD IN THE MORNING    11     ?  folic acid (FOLVITE) 1 mg tablet  TK 1 T PO QD    3     ?  omeprazole (PRILOSEC) 20 mg capsule  Take 20 mg by mouth daily.         ?  hydrocortisone (CORTIZONE) 0.5 % ointment  Apply  to affected area two (2) times a day. use thin layer         ?  VIT A/VIT C/VIT E/ZINC/COPPER (PRESERVISION AREDS PO)  Take  by mouth.         ?  magnesium oxide (MAG-OX) 400 mg tablet  Take 400 mg by mouth daily.         ?  OTHER  Hospital bed  1 Each  0     ?  OTHER  Wheel chair  1 Each  0     ?  [DISCONTINUED] potassium chloride (KLOR-CON M10) 10 mEq tablet  Take 1 Tablet by mouth daily. While on increased dose Furosemide. (Patient not taking: Reported on 11/02/2021)  7 Tablet  0           ?  [DISCONTINUED] furosemide (LASIX) 40 mg tablet  TAKE ONE-HALF TABLET PO PRF FLUID OR EDEMA (Patient not taking: Reported on 11/02/2021)    9             Past History        Past Medical History:     Past Medical History:        Diagnosis   Date         ?  Anemia, unspecified       ?  Aortic valve replaced       ?  Atrial fibrillation (HCC)       ?  Bacteremia  12/2015          MSSA         ?  Benign non-nodular prostatic hyperplasia with lower urinary tract symptoms       ?  Benign prostatic hyperplasia with lower urinary tract symptoms       ?  Chronic diastolic heart failure (HCC)       ?  Chronic idiopathic constipation       ?  Generalized edema       ?  GERD without esophagitis       ?  Gross hematuria       ?  History of aortic valve replacement with bioprosthetic valve       ?  History of depression       ?  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  Hypertension, essential  11/12/2017     ?  Hypokalemia       ?  Hyponatremia with excess extracellular fluid volume       ?  Long-term use of high-risk medication       ?  Muscle cramps       ?  Muscular deconditioning       ?  Reactive depression       ?  Spinal stenosis of lumbar region       ?  Typical atrial flutter (HCC)       ?  Unspecified osteoarthritis, unspecified site  03/03/2019     ?  Urinary retention           ?  Urinary urgency             Past Surgical History:     Past Surgical History:         Procedure  Laterality  Date          ?  HX APPENDECTOMY         ?  HX CATARACT REMOVAL         ?  HX CORONARY ARTERY BYPASS GRAFT    05/2013     ?  HX OTHER SURGICAL              NECK SURGERY          ?  HX TONSILLECTOMY         ?  HX UROLOGICAL    05/14/2016          [facility] - Cystoscopy and UroLift (transprostatic implants #10), Dr Asher Muir           Family History:   History reviewed. No pertinent family history.      Social History:     Social History          Tobacco Use         ?  Smoking status:  Never     ?  Smokeless tobacco:  Never       Substance Use Topics         ?  Alcohol use:  Never         ?  Drug use:  Never           Allergies:     Allergies        Allergen  Reactions         ?  Statins-Hmg-Coa Reductase Inhibitors  Unknown (comments)              Muscle cramps                Review of Systems     Positive for abrasions, bruising.  Negative for headache, loss of consciousness, chest pain, shortness of breath.  Abdominal pain, nausea, vomiting, diarrhea, numbness, tingling,  neck pain, back pain, dysuria.   Review of Systems        Physical Exam     GENERAL: Afebrile. Alert and oriented x 3. Hard of hearing.   EYES: EOMI. Anicteric.   HENT: Moist mucous membranes. No scleral icterus. No cervical lymphadenopathy.   LUNGS: Chest rise and fall symmetric. O2 sat 99% on room air. Clear to auscultation bilaterally. No accessory muscle use.   CARDIOVASCULAR: Regular rate and rhythm. No murmur. No JVD.  ABDOMEN: Soft, non-tender and non-distended. No palpable masses.   EXTREMITIES: No edema. Non-tender.   SKIN: Bruising to all 4 extremities. Linear abrasion to right side of scalp, not actively bleeding.   NEUROLOGIC: No focal neurological deficits. CN II-XII grossly intact bilaterally.   PSYCHIATRIC: Cooperative. Appropriate mood and affect.   Physical Exam        Lab and Diagnostic Study Results        Labs -    No results found for this or any previous visit (from the past 12 hour(s)).      Radiologic Studies -    @lastxrresult @     CT Results  (Last 48 hours)                                       11/06/21 0707    CT HEAD WO CONT  Final result            Impression:           No acute intracranial findings.                       Narrative:    EXAM: CT HEAD WITHOUT CONTRAST             CLINICAL INDICATION/HISTORY: fall, head contusion, on eliquis      -Additional: None             COMPARISON: 09/08/2021             TECHNIQUE: Serial axial images of the head were performed without intravenous      contrast. Dose reduction techniques:  Automated exposure control, mAs and/or kVp      reductions based on patient size, and iterative reconstruction.  The specific      techniques utilized on this CT exam have been documented in the patient's      electronic medical record.  Digital [employer] in Medicine      (DICOM) format image data are available to nonaffiliated external healthcare      facilities or entities on a secure, media free, reciprocally searchable basis      with patient authorization for at least a 5161-month period after this study.             _______________             FINDINGS:             BRAIN:         > Intraparenchymal hemorrhage: None.         > Mass effect/edema: None.         > Infarct/encephalomalacia: No evidence of acute cortical ischemia.        > White matter: Moderate white matter hypodensities, likely chronic      microvascular ischemic changes.         > Brain volume: Normal for age.             EXTRA-AXIAL SPACES:         > No extra-axial hemorrhage.         > No hydrocephalus.             SINUSES/MASTOIDS: Mild opacification in the maxillary sinuses.             CALVARIA: Intact.             OTHER: None.  _______________                                      CXR Results  (Last 48 hours)             None                          Medical Decision Making     - I am the first provider for this patient.      - I reviewed the vital signs, available nursing notes, past medical history, past surgical history, family history and social history.      - Initial assessment performed. The patients presenting problems have been discussed, and they are in agreement with the care plan formulated and outlined with them.  I have encouraged them to ask questions as they arise throughout their visit.      Vital Signs-Reviewed the patient's vital signs.   Patient Vitals for the past 12 hrs:           Temp  Pulse  Resp  BP           11/06/21 0551  97.8 ??F (36.6 ??C)  99  18  (!) 169/101           Records Reviewed: Nursing Notes and Old Medical Records      The patient presents with fall with a differential diagnosis of abrasion, contusion, concussion, skin tear, epidural hematoma, subdural hematoma, ICH, fracture         ED Course:         ED  Course as of 11/06/21 0749       Tue Nov 06, 2021        0748  CT HEAD WO CONT   No acute abnormality such as hemorrhage. [WM]              ED Course User Index   [WM] Lysbeth GalasMills, Farryn Linares I, MD           Provider Notes (Medical Decision Making):    88 year old male with extensive medical history sustained ground level fall, hitting head, on blood thinners. No LOC, neck/back pain. CT head pending. At change of shift, case discussed and care handed  over to Dr. Arvilla MarketMills.   MDM                Procedures     Medical Decision Makingedical Decision Making   Performed by: Lysbeth GalasWilford I Mills, MD   PROCEDURES:   Procedures            Disposition     Disposition: DC- Adult Discharges: All of the diagnostic tests were reviewed and questions answered. Diagnosis, care plan  and treatment options were discussed.  The patient understands the instructions and will follow up as directed. The patients results have been reviewed with them.  They have been counseled regarding their diagnosis.  The caregiver verbally convey understanding  and agreement of the signs, symptoms, diagnosis, treatment and prognosis and additionally agrees to follow up as recommended with their PCP in 24 - 48 hours.  They also agree with the care-plan and convey that all of their questions have been answered.   I have also put together some discharge instructions for them that include: 1) educational information regarding their diagnosis, 2) how to care for their diagnosis at home, as well a 3)  list of reasons why they would want to return to the ED prior to  their follow-up appointment, should their condition change.      Discharged      DISCHARGE PLAN:   1.      Current Discharge Medication List                 CONTINUE these medications which have NOT CHANGED          Details        ammonium lactate (AMLACTIN) 12 % topical cream  Apply  to affected area two (2) times a day. rub in to affected area well               multivitamin-FA-lycopen-lutein (CertaVite Senior)  0.4 mg-300 mcg- 250 mcg tab tablet  Take 1 Tablet by mouth daily.               propylene glycoL (Systane Complete) 0.6 % drop  Apply  to eye.               acetaminophen (M-PAP PO)  Take 500 mg by mouth three (3) times daily as needed.               silodosin (RAPAFLO) 8 mg capsule  Take 1 Capsule by mouth daily (with dinner).   Qty: 90 Capsule, Refills: 3               benzonatate (TESSALON) 100 mg capsule  Take 100 mg by mouth three (3) times daily as needed for Cough.               furosemide (LASIX) 20 mg tablet  Take one tablet daily (instead of every other day) for 1 week starting 10/16/21, then go back to dosing every other day.   Qty: 7 Tablet, Refills: 0          Associated Diagnoses: Acute pulmonary edema (HCC); Encounter for monitoring diuretic therapy               cycloSPORINE (RESTASIS) 0.05 % dpet  Administer 1 Drop to both eyes every twelve (12) hours.               loratadine (CLARITIN) 10 mg tablet  Take 10 mg by mouth daily.               finasteride (PROSCAR) 5 mg tablet  Take 1 Tablet by mouth daily.   Qty: 90 Tablet, Refills: 3               mirabegron ER (Myrbetriq) 50 mg ER tablet  Take 1 Tablet by mouth daily.   Qty: 90 Tablet, Refills: 3               nitroglycerin (NITROSTAT) 0.4 mg SL tablet  0.4 mg by SubLINGual route every five (5) minutes as needed. Up to 3 doses.               azelastine (ASTELIN) 137 mcg (0.1 %) nasal spray  1 Spray two (2) times a day. Use in each nostril as directed               apixaban (ELIQUIS) 2.5 mg tablet  2.5 mg.               citalopram (CELEXA) 10 mg tablet  TK 1 T PO QD IN THE MORNING   Refills: 11               folic  acid (FOLVITE) 1 mg tablet  TK 1 T PO QD   Refills: 3               omeprazole (PRILOSEC) 20 mg capsule  Take 20 mg by mouth daily.               hydrocortisone (CORTIZONE) 0.5 % ointment  Apply  to affected area two (2) times a day. use thin layer               VIT A/VIT C/VIT E/ZINC/COPPER (PRESERVISION AREDS PO)  Take  by mouth.                magnesium oxide (MAG-OX) 400 mg tablet  Take 400 mg by mouth daily.               !! OTHER  Hospital bed   Qty: 1 Each, Refills: 0          Associated Diagnoses: Orthostatic hypotension; Mild protein-calorie malnutrition (HCC); Chronic diastolic  heart failure (HCC); Stage 3b chronic kidney disease (HCC); Weakness generalized               !! OTHER  Wheel chair   Qty: 1 Each, Refills: 0          Associated Diagnoses: Orthostatic hypotension; Mild protein-calorie malnutrition (HCC); Chronic diastolic  heart failure (HCC); Stage 3b chronic kidney disease (HCC); Weakness generalized               !! - Potential duplicate medications found. Please discuss with provider.               2.      Follow-up Information                  Follow up With  Specialties  Details  Why  Contact Info              Quentin Angst, MD  Family Medicine  Schedule an appointment as soon as possible for a visit in 2 days    [address]   [place] [address]   [phone]                     3.  Return to ED if worse    4.      Current Discharge Medication List                       Diagnosis        Clinical Impression:       1.  Closed head injury, initial encounter         2.  Anticoagulated            Attestations:      Please note that this dictation was completed with Dragon, the computer voice recognition software.  Quite often unanticipated grammatical, syntax, homophones, and other interpretive errors are inadvertently  transcribed by the computer software.  Please disregard these errors.  Please excuse any errors that have escaped final proofreading.  Thank you.

## 2021-11-06 NOTE — ED Notes (Signed)
Received with daughter, is resident of [facility]. Patient tripped and fell coming out of bathroom this morning. Hit head no LOC. Minimal abrasion noted to right side of scalp

## 2021-11-08 ENCOUNTER — Telehealth: Payer: MEDICARE | Attending: Family | Primary: Family Medicine

## 2021-11-08 NOTE — Progress Notes (Signed)
 Patient not seen, appointment cancelled.    Needs paperwork completed by phone. Patient went to ER in [place]  for a fall on 11/06/2021. No breaks, no new medications given.      Matthew Chandler presents today for paperwork completion      Depression Screening:  3 most recent PHQ Screens 11/02/2021   PHQ Not Done -   Little interest or pleasure in doing things Several days   Feeling down, depressed, irritable, or hopeless Several days   Total Score PHQ 2 2       Learning Assessment:  Learning Assessment 06/25/2019   PRIMARY LEARNER Patient   HIGHEST LEVEL OF EDUCATION - PRIMARY LEARNER  4 YEARS OF COLLEGE   BARRIERS PRIMARY LEARNER NONE   CO-LEARNER CAREGIVER No   PRIMARY LANGUAGE ENGLISH   LEARNER PREFERENCE PRIMARY READING   ANSWERED BY patient   RELATIONSHIP SELF       Fall Risk  Fall Risk Assessment, last 12 mths 11/02/2021   Able to walk? Yes   Fall in past 12 months? 1   Do you feel unsteady? 1   Are you worried about falling 1   Is TUG test greater than 12 seconds? -   Is the gait abnormal? -   Number of falls in past 12 months 1   Fall with injury? 0       ADL  ADL Assessment 10/11/2021   Feeding yourself No Help Needed   Getting from bed to chair Help Needed   Getting dressed Help Needed   Bathing or showering Help Needed   Walk across the room (includes cane/walker) Help Needed   Using the telphone Help Needed   Taking your medications Help Needed   Preparing meals Help Needed   Managing money (expenses/bills) Help Needed   Moderately strenuous housework (laundry) Help Needed   Shopping for personal items (toiletries/medicines) Help Needed   Shopping for groceries Help Needed   Driving Help Needed   Climbing a flight of stairs Help Needed   Getting to places beyond walking distances Help Needed       Health Maintenance reviewed and discussed and ordered per Provider.    Health Maintenance Due   Topic Date Due    Pneumococcal 65+ years (1 - PCV) Never done    DTaP/Tdap/Td series (1 - Tdap) Never done     Shingles Vaccine (1 of 2) Never done    Medicare Yearly Exam  Never done   .      Coordination of Care:  1. Have you been to the ER, urgent care clinic since your last visit?  Hospitalized since your last visit? Yes Where: [place]    2. Have you seen or consulted any other health care providers outside of the [facility] since your last visit? No     3. For patients aged 24-75: Has the patient had a colonoscopy? NA - based on age     If the patient is male:    4. For patients aged 58-74: Has the patient had a mammogram within the past 2 years? NA - based on age    52. For patients aged 21-65: Has the patient had a pap smear? NA - based on age

## 2021-11-15 ENCOUNTER — Encounter: Attending: Family | Primary: Family Medicine

## 2021-12-10 ENCOUNTER — Encounter

## 2022-01-09 ENCOUNTER — Encounter: Attending: Urology | Primary: Family Medicine

## 2022-01-09 ENCOUNTER — Encounter: Attending: Urology

## 2022-01-09 ENCOUNTER — Encounter: Attending: Urology | Primary: Internal Medicine

## 2022-01-16 ENCOUNTER — Inpatient Hospital Stay: Admit: 2022-01-16 | Primary: Family Medicine

## 2022-03-28 ENCOUNTER — Encounter: Attending: Urology | Primary: Family Medicine

## 2022-06-06 ENCOUNTER — Encounter: Attending: Urology | Primary: Family Medicine

## 2022-06-06 NOTE — Progress Notes (Deleted)
Follow-up Visit        IMP: No diagnosis found.       PLAN:   ***  ***    Follow up:  No follow-ups on file.         No chief complaint on file.       HPI: Matthew Chandler is a 88 y.o. White (non-Hispanic) male  who presents today in follow up for an established diagnosis of BPH.       He is s/p UroLift 05/14/16. He had post op voiding trial on 06/03/16. He failed to voiding trial post op. Passed VT on 06/03/2016. Continues to have some urinary frequency and urgency. Nocturia 4-5x. Notes he is on Lasix and has some frequency after he takes  it.      UA reflex to culture was done 07/11/21 by PCP, 1+ bacteria noted however did not meet clinical criteria for Ucx. Pelvic US was also ordered by PCP however it appears pt's daughter declined to schedule.       Patient and daughter report marked frequency and urgency. Wearing pads now, would like to avoid catheter. Has a walker but does not use this at night.  PVR today is Conservation officer, nature. Continues on Myrbetriq 50 mg daily, Proscar 5 mg daily, Rapaflo 8 mg daily. Daughter  notes abrupt change in behavior with UTI and pt responds promptly to Antibx.       Pre Op:   Work up included AUA score in the severe range with refractory retention. UDS confirmed obstruction with some bladder function TRUS Vol: ~80 gms.        AUA Symptom Score:  No flowsheet data found.      Current Outpatient Medications   Medication Sig Dispense Refill    acetaminophen (TYLENOL) 500 MG tablet Take by mouth every 8 hours as needed      ammonium lactate (AMLACTIN) 12 % cream Apply topically 2 times daily      apixaban (ELIQUIS) 2.5 MG TABS tablet 2.5 mg      azelastine (ASTELIN) 0.1 % nasal spray 1 spray 2 times daily      citalopram (CELEXA) 10 MG tablet TK 1 T PO QD IN THE MORNING      cycloSPORINE (RESTASIS) 0.05 % ophthalmic emulsion Apply 1 drop to eye in the morning and 1 drop in the evening.      finasteride (PROSCAR) 5 MG tablet Take 5 mg by mouth daily      folic acid (FOLVITE) 1 MG tablet TK 1 T PO QD       furosemide (LASIX) 20 MG tablet Take one tablet daily (instead of every other day) for 1 week starting 10/16/21, then go back to dosing every other day.      furosemide (LASIX) 40 MG tablet TAKE ONE-HALF TABLET PO PRF FLUID OR EDEMA      hydrocortisone 0.5 % ointment Apply topically 2 times daily      loratadine (CLARITIN) 10 MG tablet Take 10 mg by mouth daily      magnesium oxide (MAG-OX) 400 MG tablet Take 400 mg by mouth daily      mirabegron (MYRBETRIQ) 50 MG TB24 Take 50 mg by mouth daily      nitroGLYCERIN (NITROSTAT) 0.4 MG SL tablet Place 0.4 mg under the tongue      omeprazole (PRILOSEC) 20 MG delayed release capsule Take 20 mg by mouth daily      potassium chloride (KLOR-CON M) 10 MEQ extended release tablet Take  10 mEq by mouth daily      silodosin (RAPAFLO) 8 MG CAPS Take 8 mg by mouth Daily with supper       No current facility-administered medications for this visit.       All:  Allergies   Allergen Reactions    Statins      Other reaction(s): Unknown (comments)       Past Medical History:   Diagnosis Date    Anemia, unspecified     Aortic valve replaced     Atrial fibrillation (HCC)     Bacteremia 12/2015    MSSA    Benign non-nodular prostatic hyperplasia with lower urinary tract symptoms     Benign prostatic hyperplasia with lower urinary tract symptoms     Chronic diastolic heart failure (HCC)     Chronic idiopathic constipation     Generalized edema     GERD without esophagitis     Gross hematuria     History of aortic valve replacement with bioprosthetic valve     History of depression     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)     Hypertension, essential 11/12/2017    Hypokalemia     Hyponatremia with excess extracellular fluid volume     Long-term use of high-risk medication     Muscle cramps     Muscular deconditioning     Reactive depression     Spinal stenosis of lumbar region     Typical atrial flutter (HCC)     Unspecified osteoarthritis, unspecified site 03/03/2019     Urinary retention     Urinary urgency        Past Surgical History:   Procedure Laterality Date    APPENDECTOMY      CATARACT REMOVAL      CORONARY ARTERY BYPASS GRAFT  05/2013    OTHER SURGICAL HISTORY      NECK SURGERY    TONSILLECTOMY      UROLOGICAL SURGERY  05/14/2016    [facility] - Cystoscopy and UroLift (transprostatic implants #10), Dr Asher Muir         PHYSICAL EXAM:    There were no vitals taken for this visit.  Constitutional: WDWN, Pleasant and appropriate affect, No acute distress.  CV:  No peripheral swelling noted  Resp: No SOB, no breathing difficulty  GI:  No abdominal masses or tenderness., No hepatospleenomegaly., and No hernias noted.  GU Male:  No CVA tenderness.  DGU:[id number] normal to visual inspection, no erythema or irritation, Sphincter with good tone, Rectum with no hemorrhoids, fissures or masses, Prostate smooth, symmetric, anodular, approximately *** grams in size   TESTES/PENIS:No scrotal rash or lesions noticed., Urethral meatus normal in location and size., No urethral discharge and normal glands.  Skin: Normal color and texture and No rashes or erythema noted  Lymphatic: No grossly palpable groin adenopathy  Neuro/Psych:  Alert and Oriented x3, affect appropriate.      Lab:  No results found for any visits on 06/06/22.     No results found for: PSA, Pricilla Holm, [id number]    Lurlean Nanny, MD

## 2022-06-21 MED ORDER — SILODOSIN 8 MG PO CAPS
8 MG | ORAL_CAPSULE | Freq: Every day | ORAL | 0 refills | Status: AC
Start: 2022-06-21 — End: 2022-09-16

## 2022-06-21 NOTE — Telephone Encounter (Signed)
Patient's daughter Romona Curls and requested refill for his silodosin 8 mg sent to the pharmacy on file.  Per last ov notes on 07/31/21- Continue Myrbetriq 50 mg daily, Proscar 5 mg daily, Rapaflo 8 mg daily.    Patient has f/u on 11//30/23 at 3:45  with Dr. Despina Hidden.  Refills  sent, #90/R0

## 2022-09-11 ENCOUNTER — Telehealth: Admit: 2022-09-11 | Discharge: 2022-09-11 | Payer: MEDICARE | Attending: Urology | Primary: Family Medicine

## 2022-09-11 DIAGNOSIS — R350 Frequency of micturition: Secondary | ICD-10-CM

## 2022-09-11 NOTE — Telephone Encounter (Signed)
Pt's daughter called, states that the pt was supposed to have a telehealth today but they missed the call and wasn't able to get through before the phones rolled. The pt's daughter is requesting a call back to resched. She would like to do another telehealth due to his age.     CBN: [phone]

## 2022-09-11 NOTE — Progress Notes (Unsigned)
Telemedicine Visit        IMP: No diagnosis found.       PLAN:   ***  ***    Follow up:  No follow-ups on file.         Chief Complaint   Patient presents with    Benign Prostatic Hypertrophy        HPI: Matthew Chandler is a 88 y.o. White (non-Hispanic) male  who presents today in follow up for an established diagnosis of BPH.      He is s/p UroLift 05/14/16. He had post op voiding trial on 06/03/16. He failed to voiding trial post op. Passed VT on 06/03/2016. Continues to have some urinary frequency and urgency. Nocturia 4-5x. Notes he is on Lasix and has some frequency after he takes it.     UA reflex to culture was done 07/11/21 by PCP, 1+ bacteria noted however did not meet clinical criteria for Ucx. Pelvic US was also ordered by PCP however it appears pt's daughter declined to schedule.      Patient and daughter report marked frequency and urgency. Wearing pads now, would like to avoid catheter. Has a walker but does not use this at night.  PVR today is Conservation officer, nature. Continues on Myrbetriq 50 mg daily, Proscar 5 mg daily, Rapaflo 8 mg daily. Daughter  notes abrupt change in behavior with UTI and pt responds promptly to Antibx.      Pre Op:  Work up included AUA score in the severe range with refractory retention. UDS confirmed obstruction with some bladder function TRUS Vol: ~80 gms.         AUA Symptom Score:       No data to display                  Current Outpatient Medications   Medication Sig Dispense Refill    silodosin (RAPAFLO) 8 MG CAPS Take 1 capsule by mouth Daily with supper 90 capsule 0    acetaminophen (TYLENOL) 500 MG tablet Take by mouth every 8 hours as needed      ammonium lactate (AMLACTIN) 12 % cream Apply topically 2 times daily      apixaban (ELIQUIS) 2.5 MG TABS tablet 1 tablet      azelastine (ASTELIN) 0.1 % nasal spray 1 spray 2 times daily      citalopram (CELEXA) 10 MG tablet TK 1 T PO QD IN THE MORNING      cycloSPORINE (RESTASIS) 0.05 % ophthalmic emulsion Apply 1 drop to eye in the morning and  1 drop in the evening.      finasteride (PROSCAR) 5 MG tablet Take 1 tablet by mouth daily      folic acid (FOLVITE) 1 MG tablet TK 1 T PO QD      furosemide (LASIX) 20 MG tablet Take one tablet daily (instead of every other day) for 1 week starting 10/16/21, then go back to dosing every other day.      furosemide (LASIX) 40 MG tablet TAKE ONE-HALF TABLET PO PRF FLUID OR EDEMA      hydrocortisone 0.5 % ointment Apply topically 2 times daily      loratadine (CLARITIN) 10 MG tablet Take 1 tablet by mouth daily      magnesium oxide (MAG-OX) 400 MG tablet Take 1 tablet by mouth daily      mirabegron (MYRBETRIQ) 50 MG TB24 Take 50 mg by mouth daily      nitroGLYCERIN (NITROSTAT) 0.4 MG SL  tablet Place 1 tablet under the tongue      omeprazole (PRILOSEC) 20 MG delayed release capsule Take 1 capsule by mouth daily      potassium chloride (KLOR-CON M) 10 MEQ extended release tablet Take 1 tablet by mouth daily       No current facility-administered medications for this visit.       All:  Allergies   Allergen Reactions    Statins Other (See Comments)     Other reaction(s): Unknown (comments)  Muscle cramps  Muscle cramps  Other reaction(s): Unknown (comments)         Past Medical History:   Diagnosis Date    Anemia, unspecified     Aortic valve replaced     Atrial fibrillation (HCC)     Bacteremia 12/2015    MSSA    Benign non-nodular prostatic hyperplasia with lower urinary tract symptoms     Benign prostatic hyperplasia with lower urinary tract symptoms     Chronic diastolic heart failure (HCC)     Chronic idiopathic constipation     Generalized edema     GERD without esophagitis     Gross hematuria     History of aortic valve replacement with bioprosthetic valve     History of depression     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)     Hypertension, essential 11/12/2017    Hypokalemia     Hyponatremia with excess extracellular fluid volume     Long-term use of high-risk medication     Muscle cramps      Muscular deconditioning     Reactive depression     Spinal stenosis of lumbar region     Typical atrial flutter (HCC)     Unspecified osteoarthritis, unspecified site 03/03/2019    Urinary retention     Urinary urgency        Past Surgical History:   Procedure Laterality Date    APPENDECTOMY      CATARACT REMOVAL      CORONARY ARTERY BYPASS GRAFT  05/2013    OTHER SURGICAL HISTORY      NECK SURGERY    TONSILLECTOMY      UROLOGICAL SURGERY  05/14/2016    [facility] - Cystoscopy and UroLift (transprostatic implants #10), Dr Asher Muir        Lab:  No results found for any visits on 09/11/22.     Lurlean Nanny, MD    PMHx, PSHx, SOCHx, FAMHx:  Unchanged as documented on 07/31/2021    Carola Frost, was evaluated through a synchronous (real-time) audio-video encounter. The patient (or guardian if applicable) is aware that this is a billable service, which includes applicable co-pays. This Virtual Visit was conducted with patient's (and/or legal guardian's) consent. The visit was conducted pursuant to the emergency declaration under the [employer] and the [employer], [employer] waiver authority and the [employer] and [employer] Act. It is within this context (and with the understanding that this method of patient encounter is in the patient's best interest as well as the health and safety of other patients and the public) that telehealth is being provided for this patient encounter rather than a face-to-face visit. This patient encounter is appropriate and reasonable under the circumstances given the patient's particular presentation at this time. The patient has been advised of the potential risks and limitations of this mode of treatment (including, but not limited to, the absence of in-person examination) and has agreed to  be treated in a remote fashion. The patient has also been advised to contact this office for worsening conditions or problems, and seek emergency medical  treatment and/or call 911 if the patient deems either necessary. Patient identification was verified, and a caregiver was present when appropriate, prior to the initiation of the visit.

## 2022-09-16 ENCOUNTER — Telehealth: Admit: 2022-09-16 | Discharge: 2022-09-16 | Payer: MEDICARE | Attending: Urology | Primary: Family Medicine

## 2022-09-16 DIAGNOSIS — N401 Enlarged prostate with lower urinary tract symptoms: Secondary | ICD-10-CM

## 2022-09-16 MED ORDER — MIRABEGRON ER 50 MG PO TB24
50 MG | ORAL_TABLET | Freq: Every day | ORAL | 3 refills | Status: AC
Start: 2022-09-16 — End: 2023-09-13

## 2022-09-16 MED ORDER — SILODOSIN 8 MG PO CAPS
8 MG | ORAL_CAPSULE | Freq: Every day | ORAL | 3 refills | Status: AC
Start: 2022-09-16 — End: 2023-09-13

## 2022-09-16 MED ORDER — FINASTERIDE 5 MG PO TABS
5 MG | ORAL_TABLET | Freq: Every day | ORAL | 3 refills | Status: DC
Start: 2022-09-16 — End: 2023-09-13

## 2022-09-16 NOTE — Progress Notes (Signed)
Telemedicine Visit        IMP:     ICD-10-CM    1. Benign prostatic hyperplasia with lower urinary tract symptoms, symptom details unspecified  N40.1       2. OAB (overactive bladder)  N32.81       3. History of urinary retention  Z87.898              PLAN:   Continue Proscar 5 mg daily. Refill provided today  Continue Rapaflo 8 mg daily. Refill provided today  Continue Myrbetriq 50 mg daily. Refill provided today.   Recommend pt start Dmannose 1,000 mg daily and Cranberry supplement. Will place order for assisted living facility  [facility] [place] [phone]    Continue to have sitters push fluids while pt awake to help decrease UTIs    Follow up:  Return for 1 year TH visit for med refill .           Chief Complaint   Patient presents with    Benign Prostatic Hypertrophy        HPI: Matthew Chandler is a 88 y.o. White (non-Hispanic) male  who presents today in follow up for an established diagnosis of BPH.      He is s/p UroLift 05/14/16. He had post op voiding trial on 06/03/16. He failed to voiding trial post op. Passed VT on 06/03/2016. Continues to have some urinary frequency and urgency. Nocturia 4-5x. Notes he is on Lasix and has some frequency after he takes it.     UA reflex to culture was done 07/11/21 by PCP, 1+ bacteria noted however did not meet clinical criteria for Ucx. Pelvic US was also ordered by PCP however it appears pt's daughter declined to schedule.     Daughter has previously noted an abrupt change in behavior with UTI but he responds promptly to ABX.    TH visit conducted today with pt's daughter. He continues on Myrbetriq 50 mg daily, Proscar 5 mg daily, and Rapaflo 8 mg daily. He is wearing pads. PVR last visit was 0 cc.   Continues with frequency and urgency. Wears pads.     Has had 2 UTIS in the interim. Gets AMS and fever with UTIs.      Pre Op:  Work up included AUA score in the severe range with refractory retention. UDS confirmed obstruction  with some bladder function TRUS Vol: ~80 gms.         AUA Symptom Score:      09/16/2022     2:47 PM   AUA Symptom Score Index   Over the last month, how often have you had a sensation of not emptying your bladder completely after you finished urinating? 3   During the last month, how often have you had to urinate again less than two hours after you finished urinating? 5   During the last month, how often have you stopped and started again several times when you urinated? 3   During the last month, how often have you found it difficult to postpone urination? 5   During the last month, how often have you had a weak urinary stream? 4   During the last month, how often have you had to push or strain to begin urination? 3   During the last month, how many times did you most typically get up to urinate from the time you went to bed at night until  the time you got up in the morning? 5   Symptom Score 1 - 7 Mild, 8 - 19 Moderate, 20 - 35 Severe 28   If you were to spend the rest of your life with your urinary condition the way it is now, how would you feel about that? Mixed-about equally satisfied         Current Outpatient Medications   Medication Sig Dispense Refill    silodosin (RAPAFLO) 8 MG CAPS Take 1 capsule by mouth Daily with supper 90 capsule 3    mirabegron (MYRBETRIQ) 50 MG TB24 Take 50 mg by mouth daily 90 tablet 3    finasteride (PROSCAR) 5 MG tablet Take 1 tablet by mouth daily 90 tablet 3    acetaminophen (TYLENOL) 500 MG tablet Take by mouth every 8 hours as needed      ammonium lactate (AMLACTIN) 12 % cream Apply topically 2 times daily      apixaban (ELIQUIS) 2.5 MG TABS tablet 1 tablet      azelastine (ASTELIN) 0.1 % nasal spray 1 spray 2 times daily      cycloSPORINE (RESTASIS) 0.05 % ophthalmic emulsion Apply 1 drop to eye in the morning and 1 drop in the evening.      folic acid (FOLVITE) 1 MG tablet TK 1 T PO QD      furosemide (LASIX) 20 MG tablet Take one tablet daily (instead of every other day)  for 1 week starting 10/16/21, then go back to dosing every other day.      furosemide (LASIX) 40 MG tablet TAKE ONE-HALF TABLET PO PRF FLUID OR EDEMA      hydrocortisone 0.5 % ointment Apply topically 2 times daily      loratadine (CLARITIN) 10 MG tablet Take 1 tablet by mouth daily      magnesium oxide (MAG-OX) 400 MG tablet Take 1 tablet by mouth daily      nitroGLYCERIN (NITROSTAT) 0.4 MG SL tablet Place 1 tablet under the tongue      omeprazole (PRILOSEC) 20 MG delayed release capsule Take 1 capsule by mouth daily      potassium chloride (KLOR-CON M) 10 MEQ extended release tablet Take 1 tablet by mouth daily      citalopram (CELEXA) 10 MG tablet TK 1 T PO QD IN THE MORNING (Patient not taking: Reported on 09/16/2022)       No current facility-administered medications for this visit.       All:  Allergies   Allergen Reactions    Statins Other (See Comments)     Other reaction(s): Unknown (comments)  Muscle cramps  Muscle cramps  Other reaction(s): Unknown (comments)         Past Medical History:   Diagnosis Date    Anemia, unspecified     Aortic valve replaced     Atrial fibrillation (HCC)     Bacteremia 12/2015    MSSA    Benign non-nodular prostatic hyperplasia with lower urinary tract symptoms     Benign prostatic hyperplasia with lower urinary tract symptoms     Chronic diastolic heart failure (HCC)     Chronic idiopathic constipation     Generalized edema     GERD without esophagitis     Gross hematuria     History of aortic valve replacement with bioprosthetic valve     History of depression     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)     Hypertension, essential 11/12/2017  Hypokalemia     Hyponatremia with excess extracellular fluid volume     Long-term use of high-risk medication     Muscle cramps     Muscular deconditioning     Reactive depression     Spinal stenosis of lumbar region     Typical atrial flutter (HCC)     Unspecified osteoarthritis, unspecified site 03/03/2019    Urinary  retention     Urinary urgency        Past Surgical History:   Procedure Laterality Date    APPENDECTOMY      CATARACT REMOVAL      CORONARY ARTERY BYPASS GRAFT  05/2013    OTHER SURGICAL HISTORY      NECK SURGERY    TONSILLECTOMY      UROLOGICAL SURGERY  05/14/2016    [facility] - Cystoscopy and UroLift (transprostatic implants #10), Dr Sampson Si        Lab:  No results found for any visits on 09/16/22.       Jamie A. Leander Rams  [facility] [place]   [place] [address]   [phone] (office)      Arnette Schaumann, MD    PMHx, PSHx, SOCHx, FAMHx:  Unchanged as documented on 07/31/2021    Matthew Chandler, was evaluated through a synchronous (real-time) audio-video encounter. The patient (or guardian if applicable) is aware that this is a billable service, which includes applicable co-pays. This Virtual Visit was conducted with patient's (and/or legal guardian's) consent. The visit was conducted pursuant to the emergency declaration under the [place] waiver authority and the [employer] Act. It is within this context (and with the understanding that this method of patient encounter is in the patient's best interest as well as the health and safety of other patients and the public) that telehealth is being provided for this patient encounter rather than a face-to-face visit. This patient encounter is appropriate and reasonable under the circumstances given the patient's particular presentation at this time. The patient has been advised of the potential risks and limitations of this mode of treatment (including, but not limited to, the absence of in-person examination) and has agreed to be treated in a remote fashion. The patient has also been advised to contact this office for worsening conditions or problems, and seek emergency medical treatment and/or call 911 if the patient deems either  necessary. Patient identification was verified, and a caregiver was present when appropriate, prior to the initiation of the visit.

## 2022-09-18 NOTE — Telephone Encounter (Signed)
Matthew Chandler has scripts in but the Med tech at his facility, [name], [phone], needs clarification for the "strength of the Cranberry Supplement"; the pharmacy there closes at 2 today.   They appreciate the assistance in advance.

## 2022-09-23 NOTE — Telephone Encounter (Signed)
Received a call from [facility] from [facility]. PT was advised to start a cranberry supplement but the dosage is needed for to send in to pts pharmacy. Routing out for assistance.     [facility] fax number: [phone]    Page Spiro, MA

## 2022-09-24 NOTE — Telephone Encounter (Signed)
Order for Cranberry 450 mg and Silodosin faxed to number provided.

## 2022-09-26 ENCOUNTER — Encounter: Attending: Urology | Primary: Family Medicine

## 2023-06-29 DEATH — deceased
